# Patient Record
Sex: Male | Born: 1950 | Race: White | Hispanic: No | Marital: Married | Smoking: Former smoker
Health system: Southern US, Community
[De-identification: ages and names within clinical notes are randomized; demographics above are authoritative.]

## PROBLEM LIST (undated history)

## (undated) DIAGNOSIS — E119 Type 2 diabetes mellitus without complications: Secondary | ICD-10-CM

## (undated) DIAGNOSIS — I7090 Unspecified atherosclerosis: Secondary | ICD-10-CM

## (undated) DIAGNOSIS — I499 Cardiac arrhythmia, unspecified: Secondary | ICD-10-CM

## (undated) DIAGNOSIS — M359 Systemic involvement of connective tissue, unspecified: Secondary | ICD-10-CM

## (undated) DIAGNOSIS — G4730 Sleep apnea, unspecified: Secondary | ICD-10-CM

## (undated) DIAGNOSIS — I639 Cerebral infarction, unspecified: Secondary | ICD-10-CM

## (undated) DIAGNOSIS — G459 Transient cerebral ischemic attack, unspecified: Secondary | ICD-10-CM

## (undated) DIAGNOSIS — IMO0002 Reserved for concepts with insufficient information to code with codable children: Secondary | ICD-10-CM

## (undated) DIAGNOSIS — F329 Major depressive disorder, single episode, unspecified: Secondary | ICD-10-CM

## (undated) DIAGNOSIS — I4891 Unspecified atrial fibrillation: Secondary | ICD-10-CM

## (undated) DIAGNOSIS — R42 Dizziness and giddiness: Secondary | ICD-10-CM

## (undated) DIAGNOSIS — M1710 Unilateral primary osteoarthritis, unspecified knee: Secondary | ICD-10-CM

## (undated) DIAGNOSIS — Z972 Presence of dental prosthetic device (complete) (partial): Secondary | ICD-10-CM

## (undated) DIAGNOSIS — Z8669 Personal history of other diseases of the nervous system and sense organs: Secondary | ICD-10-CM

## (undated) DIAGNOSIS — Z8619 Personal history of other infectious and parasitic diseases: Secondary | ICD-10-CM

## (undated) DIAGNOSIS — F32A Depression, unspecified: Secondary | ICD-10-CM

## (undated) DIAGNOSIS — J42 Unspecified chronic bronchitis: Secondary | ICD-10-CM

## (undated) DIAGNOSIS — I340 Nonrheumatic mitral (valve) insufficiency: Secondary | ICD-10-CM

## (undated) DIAGNOSIS — I071 Rheumatic tricuspid insufficiency: Secondary | ICD-10-CM

## (undated) DIAGNOSIS — I10 Essential (primary) hypertension: Secondary | ICD-10-CM

## (undated) DIAGNOSIS — C801 Malignant (primary) neoplasm, unspecified: Secondary | ICD-10-CM

## (undated) DIAGNOSIS — C8590 Non-Hodgkin lymphoma, unspecified, unspecified site: Secondary | ICD-10-CM

## (undated) DIAGNOSIS — H4920 Sixth [abducent] nerve palsy, unspecified eye: Secondary | ICD-10-CM

## (undated) DIAGNOSIS — M797 Fibromyalgia: Secondary | ICD-10-CM

## (undated) DIAGNOSIS — K219 Gastro-esophageal reflux disease without esophagitis: Secondary | ICD-10-CM

## (undated) HISTORY — DX: Personal history of other infectious and parasitic diseases: Z86.19

## (undated) HISTORY — DX: Sixth (abducent) nerve palsy, unspecified eye: H49.20

## (undated) HISTORY — DX: Transient cerebral ischemic attack, unspecified: G45.9

## (undated) HISTORY — DX: Cerebral infarction, unspecified: I63.9

## (undated) HISTORY — DX: Personal history of other diseases of the nervous system and sense organs: Z86.69

## (undated) HISTORY — PX: TRIGGER FINGER RELEASE: SHX641

## (undated) HISTORY — PX: CYST EXCISION: SHX5701

## (undated) HISTORY — DX: Unilateral primary osteoarthritis, unspecified knee: M17.10

## (undated) HISTORY — DX: Essential (primary) hypertension: I10

## (undated) HISTORY — DX: Gastro-esophageal reflux disease without esophagitis: K21.9

## (undated) HISTORY — DX: Unspecified atrial fibrillation: I48.91

## (undated) HISTORY — DX: Reserved for concepts with insufficient information to code with codable children: IMO0002

## (undated) HISTORY — DX: Non-Hodgkin lymphoma, unspecified, unspecified site: C85.90

---

## 2009-08-15 ENCOUNTER — Ambulatory Visit: Payer: Self-pay | Admitting: Gastroenterology

## 2009-08-17 LAB — PATHOLOGY REPORT

## 2010-01-06 HISTORY — PX: COLONOSCOPY: SHX174

## 2011-12-19 ENCOUNTER — Emergency Department: Payer: Self-pay | Admitting: Emergency Medicine

## 2011-12-19 LAB — COMPREHENSIVE METABOLIC PANEL
Albumin: 4.4 g/dL (ref 3.4–5.0)
Alkaline Phosphatase: 110 U/L (ref 50–136)
Anion Gap: 7 (ref 7–16)
BUN: 18 mg/dL (ref 7–18)
Bilirubin,Total: 0.7 mg/dL (ref 0.2–1.0)
Calcium, Total: 9 mg/dL (ref 8.5–10.1)
Chloride: 106 mmol/L (ref 98–107)
Co2: 26 mmol/L (ref 21–32)
Creatinine: 1.06 mg/dL (ref 0.60–1.30)
EGFR (African American): >60
EGFR (Non-African Amer.): >60
Glucose: 95 mg/dL (ref 65–99)
Osmolality: 279 (ref 275–301)
Potassium: 4.1 mmol/L (ref 3.5–5.1)
SGOT(AST): 44 U/L — ABNORMAL HIGH (ref 15–37)
SGPT (ALT): 46 U/L (ref 12–78)
Sodium: 139 mmol/L (ref 136–145)
Total Protein: 9 g/dL — ABNORMAL HIGH (ref 6.4–8.2)

## 2011-12-19 LAB — CBC
HCT: 47.1 % (ref 40.0–52.0)
HGB: 15.4 g/dL (ref 13.0–18.0)
MCH: 28 pg (ref 26.0–34.0)
MCHC: 32.6 g/dL (ref 32.0–36.0)
MCV: 86 fL (ref 80–100)
Platelet: 348 10*3/uL (ref 150–440)
RBC: 5.48 10*6/uL (ref 4.40–5.90)
RDW: 14.2 % (ref 11.5–14.5)
WBC: 8.5 10*3/uL (ref 3.8–10.6)

## 2011-12-19 LAB — PROTIME-INR
INR: 1
Prothrombin Time: 13.1 secs (ref 11.5–14.7)

## 2011-12-19 LAB — CK TOTAL AND CKMB (NOT AT ARMC)
CK, Total: 143 U/L (ref 35–232)
CK-MB: 3.2 ng/mL (ref 0.5–3.6)

## 2011-12-19 LAB — TROPONIN I: Troponin-I: <0.02 ng/mL

## 2011-12-19 LAB — APTT: Activated PTT: 36.9 secs — ABNORMAL HIGH (ref 23.6–35.9)

## 2011-12-23 ENCOUNTER — Ambulatory Visit (INDEPENDENT_AMBULATORY_CARE_PROVIDER_SITE_OTHER): Payer: PRIVATE HEALTH INSURANCE | Admitting: Cardiovascular Disease

## 2011-12-23 ENCOUNTER — Encounter: Payer: Self-pay | Admitting: Cardiovascular Disease

## 2011-12-23 VITALS — BP 112/80 | HR 106 | Ht 66.0 in | Wt 266.8 lb

## 2011-12-23 DIAGNOSIS — I10 Essential (primary) hypertension: Secondary | ICD-10-CM

## 2011-12-23 DIAGNOSIS — E785 Hyperlipidemia, unspecified: Secondary | ICD-10-CM

## 2011-12-23 DIAGNOSIS — I4891 Unspecified atrial fibrillation: Secondary | ICD-10-CM

## 2011-12-23 MED ORDER — DILTIAZEM HCL ER COATED BEADS 240 MG PO CP24: 240.0000 mg | ORAL_CAPSULE | Freq: Every day | ORAL | Status: DC

## 2011-12-23 MED ORDER — LOSARTAN POTASSIUM 50 MG PO TABS: 50.0000 mg | ORAL_TABLET | Freq: Every day | ORAL | Status: DC

## 2011-12-23 NOTE — Assessment & Plan Note (Signed)
The patient has atrial fibrillation of unknown duration. He does not seem to be symptomatic from this even when he was more tachycardic. Thus, I don't see much utility of restoring sinus rhythm with an antiarrhythmic medication or cardioversion. He cut down on caffeine intake significantly. I recommend rate control. I will increase the dose of diltiazem extended release to 240 mg once daily. Continue aspirin daily. His [place] VASc score is 1 due to hypertension. Thus, his thromboembolic risk is low. He does not have symptoms suggestive of sleep apnea. I recommend an echocardiogram to evaluate LV systolic function and any other associated structural abnormalities. Followup after echocardiogram.

## 2011-12-23 NOTE — Progress Notes (Signed)
Primary care physician: Dr. Brayton El with with [facility]  HPI  This is a pleasant 61 year old gentleman who is here today for followup visit after an emergency room visit for atrial fibrillation. The patient has a long history of well-controlled hypertension and obesity. He presented recently for a routine physical with his primary care physician and was noted to be tachycardic with irregular rhythm. ECG confirms atrial fibrillation. He was tachycardic with a heart rate of 128 beats per minute. Surprisingly, he did not have much symptoms related to that. He was sent to the emergency room at [facility]. His labs were unremarkable. Cardiac enzymes were negative. Thyroid function was normal. He was discharged home on diltiazem extended release 120 mg daily as well as aspirin daily for stroke prophylaxis. The patient is not aware of any previous history of cardiac arrhythmia. He has no history of ischemic heart disease or heart failure. There is no history of diabetes or stroke. He was not aware that he was tachycardic. He denies chest pain or dyspnea. He has no palpitation. He has no history of sleep apnea. He was drinking coffee heavily on the average of one pot a day. Since his diagnosis with atrial fibrillation, he switched to decaf.  Allergies  Allergen Reactions  . Celebrex (Celecoxib)   . Penicillins     Hives      Current Outpatient Prescriptions on File Prior to Visit  Medication Sig Dispense Refill  . loratadine (CLARITIN) 10 MG tablet Take 10 mg by mouth daily.      Marland Kitchen omeprazole (PRILOSEC) 20 MG capsule Take 20 mg by mouth daily.      Marland Kitchen diltiazem (CARDIZEM CD) 240 MG 24 hr capsule Take 1 capsule (240 mg total) by mouth daily.  31 capsule  6  . losartan (COZAAR) 50 MG tablet Take 1 tablet (50 mg total) by mouth daily.  31 tablet  6     Past Medical History  Diagnosis Date  . GERD (gastroesophageal reflux disease)   . Osteoarthrosis, unspecified whether generalized or  localized, lower leg   . H/O Bell's palsy   . History of [place] measles   . Atrial fibrillation   . Hypertension      Past Surgical History  Procedure Date  . Cyst excision   . Trigger finger release      Family History  Problem Relation Age of Onset  . Hypertension Mother      History   Social History  . Marital Status: Married    Spouse Name: N/A    Number of Children: N/A  . Years of Education: N/A   Occupational History  . Not on file.   Social History Main Topics  . Smoking status: Former Smoker -- 4.5 packs/day for 15 years    Types: Cigarettes  . Smokeless tobacco: Not on file  . Alcohol Use: No  . Drug Use: Yes    Special: Marijuana     Comment: PAST  . Sexually Active:    Other Topics Concern  . Not on file   Social History Narrative  . No narrative on file     ROS Constitutional: Negative for fever, chills, diaphoresis, activity change, appetite change and fatigue.  HENT: Negative for hearing loss, nosebleeds, congestion, sore throat, facial swelling, drooling, trouble swallowing, neck pain, voice change, sinus pressure and tinnitus.  Eyes: Negative for photophobia, pain, discharge and visual disturbance.  Respiratory: Negative for apnea, cough, chest tightness, shortness of breath and wheezing.  Cardiovascular: Negative for chest pain, palpitations and leg swelling.  Gastrointestinal: Negative for nausea, vomiting, abdominal pain, diarrhea, constipation, blood in stool and abdominal distention.  Genitourinary: Negative for dysuria, urgency, frequency, hematuria and decreased urine volume.  Musculoskeletal: Negative for myalgias, back pain, joint swelling, arthralgias and gait problem.  Skin: Negative for color change, pallor, rash and wound.  Neurological: Negative for dizziness, tremors, seizures, syncope, speech difficulty, weakness, light-headedness, numbness and headaches.  Psychiatric/Behavioral: Negative for suicidal ideas, hallucinations,  behavioral problems and agitation. The patient is not nervous/anxious.     PHYSICAL EXAM   BP 112/80  Pulse 106  Ht 5\' 6"  (1.676 m)  Wt 266 lb 12 oz (120.997 kg)  BMI 43.05 kg/m2 Constitutional: He is oriented to person, place, and time. He appears well-developed and well-nourished. No distress.  HENT: No nasal discharge.  Head: Normocephalic and atraumatic.  Eyes: Pupils are equal and round. Right eye exhibits no discharge. Left eye exhibits no discharge.  Neck: Normal range of motion. Neck supple. No JVD present. No thyromegaly present.  Cardiovascular: Slightly tachycardic, irregular rhythm, normal heart sounds and. Exam reveals no gallop and no friction rub. No murmur heard.  Pulmonary/Chest: Effort normal and breath sounds normal. No stridor. No respiratory distress. He has no wheezes. He has no rales. He exhibits no tenderness.  Abdominal: Soft. Bowel sounds are normal. He exhibits no distension. There is no tenderness. There is no rebound and no guarding.  Musculoskeletal: Normal range of motion. He exhibits no edema and no tenderness.  Neurological: He is alert and oriented to person, place, and time. Coordination normal.  Skin: Skin is warm and dry. No rash noted. He is not diaphoretic. No erythema. No pallor.  Psychiatric: He has a normal mood and affect. His behavior is normal. Judgment and thought content normal.       EKG: Atrial fibrillation  ABNORMAL RHYTHM   ASSESSMENT AND PLAN

## 2011-12-23 NOTE — Assessment & Plan Note (Signed)
Given that I am increasing the dose of diltiazem, I would decrease the dose of losartan 50 mg once daily to prevent hypotension.

## 2011-12-23 NOTE — Assessment & Plan Note (Signed)
Recent lipid profile showed a total cholesterol of 208, HDL 51, triglyceride of 78 and an LDL of 141. The patient has no history of documented atherosclerosis. Thus, I recommend an attempt of lifestyle changes. The patient is determined actually to lose weight.

## 2011-12-23 NOTE — Patient Instructions (Addendum)
Increase Diltiazem ER (Cardizem) to 240 mg once daily.  Decrease Losartan to 50 mg once daily.   Your physician has requested that you have an echocardiogram. Echocardiography is a painless test that uses sound waves to create images of your heart. It provides your doctor with information about the size and shape of your heart and how well your heart's chambers and valves are working. This procedure takes approximately one hour. There are no restrictions for this procedure.  Follow up after echo.

## 2011-12-26 ENCOUNTER — Ambulatory Visit: Payer: Self-pay | Admitting: Family Medicine

## 2011-12-29 ENCOUNTER — Encounter: Payer: Self-pay | Admitting: Cardiovascular Disease

## 2012-01-01 ENCOUNTER — Other Ambulatory Visit (INDEPENDENT_AMBULATORY_CARE_PROVIDER_SITE_OTHER): Payer: PRIVATE HEALTH INSURANCE

## 2012-01-01 ENCOUNTER — Other Ambulatory Visit: Payer: Self-pay

## 2012-01-01 DIAGNOSIS — I4891 Unspecified atrial fibrillation: Secondary | ICD-10-CM

## 2012-01-06 ENCOUNTER — Ambulatory Visit (INDEPENDENT_AMBULATORY_CARE_PROVIDER_SITE_OTHER): Payer: PRIVATE HEALTH INSURANCE | Admitting: Cardiovascular Disease

## 2012-01-06 ENCOUNTER — Encounter: Payer: Self-pay | Admitting: Cardiovascular Disease

## 2012-01-06 VITALS — BP 132/78 | HR 80 | Ht 66.0 in | Wt 271.0 lb

## 2012-01-06 DIAGNOSIS — I4891 Unspecified atrial fibrillation: Secondary | ICD-10-CM

## 2012-01-06 DIAGNOSIS — E785 Hyperlipidemia, unspecified: Secondary | ICD-10-CM

## 2012-01-06 DIAGNOSIS — I10 Essential (primary) hypertension: Secondary | ICD-10-CM

## 2012-01-06 NOTE — Progress Notes (Signed)
Primary care physician: Dr. Brayton El with with [facility]  HPI  This is a pleasant 61 year old gentleman who is here today for followup visit regarding recently diagnosed atrial fibrillation. The patient has a long history of well-controlled hypertension and obesity. He presented recently for a routine physical with his primary care physician and was noted to be tachycardic with irregular rhythm. ECG confirms atrial fibrillation. He was tachycardic with a heart rate of 128 beats per minute. Surprisingly, he did not have much symptoms related to that. He was sent to the emergency room at [facility]. His labs were unremarkable. Cardiac enzymes were negative. Thyroid function was normal. He was discharged home on diltiazem extended release 120 mg daily as well as aspirin daily for stroke prophylaxis. There is no history of diabetes or stroke. He was not aware that he was tachycardic. He has no history of sleep apnea. He was drinking coffee heavily on the average of one pot a day. Since his diagnosis with atrial fibrillation, he switched to decaf. During last visit, he was still in atrial fibrillation with mild tachycardia. Thus, I increased diltiazem 240 mg once daily and decrease losartan 50 mg once daily. He continues to deny chest pain, dyspnea or palpitations  Allergies  Allergen Reactions  . Celebrex (Celecoxib)   . Penicillins     Hives      Current Outpatient Prescriptions on File Prior to Visit  Medication Sig Dispense Refill  . aspirin 325 MG tablet Take 325 mg by mouth daily.      Marland Kitchen b complex vitamins tablet Take 1 tablet by mouth daily.      . B Complex-C (SUPER B COMPLEX PO) Take by mouth daily.      . diclofenac (VOLTAREN) 75 MG EC tablet Take 75 mg by mouth daily.       Marland Kitchen diltiazem (CARDIZEM CD) 240 MG 24 hr capsule Take 1 capsule (240 mg total) by mouth daily.  31 capsule  6  . loratadine (CLARITIN) 10 MG tablet Take 10 mg by mouth daily.      Marland Kitchen losartan (COZAAR) 50  MG tablet Take 1 tablet (50 mg total) by mouth daily.  31 tablet  6  . Magnesium 100 MG CAPS Takes 2 tablets daily.      . Melatonin 5 MG TABS Take by mouth daily.      Marland Kitchen omeprazole (PRILOSEC) 20 MG capsule Take 20 mg by mouth daily.         Past Medical History  Diagnosis Date  . GERD (gastroesophageal reflux disease)   . Osteoarthrosis, unspecified whether generalized or localized, lower leg   . H/O Bell's palsy   . History of [place] measles   . Atrial fibrillation   . Hypertension      Past Surgical History  Procedure Date  . Cyst excision   . Trigger finger release      Family History  Problem Relation Age of Onset  . Hypertension Mother      History   Social History  . Marital Status: Married    Spouse Name: N/A    Number of Children: N/A  . Years of Education: N/A   Occupational History  . Not on file.   Social History Main Topics  . Smoking status: Former Smoker -- 4.5 packs/day for 15 years    Types: Cigarettes  . Smokeless tobacco: Not on file  . Alcohol Use: No  . Drug Use: Yes    Special: Marijuana  Comment: PAST  . Sexually Active:    Other Topics Concern  . Not on file   Social History Narrative  . No narrative on file      PHYSICAL EXAM   BP 132/78  Pulse 80  Ht 5\' 6"  (1.676 m)  Wt 271 lb (122.925 kg)  BMI 43.74 kg/m2 Constitutional: He is oriented to person, place, and time. He appears well-developed and well-nourished. No distress.  HENT: No nasal discharge.  Head: Normocephalic and atraumatic.  Eyes: Pupils are equal and round. Right eye exhibits no discharge. Left eye exhibits no discharge.  Neck: Normal range of motion. Neck supple. No JVD present. No thyromegaly present.  Cardiovascular: Normal rate,regular rhythm, normal heart sounds and. Exam reveals no gallop and no friction rub. No murmur heard.  Pulmonary/Chest: Effort normal and breath sounds normal. No stridor. No respiratory distress. He has no wheezes. He has  no rales. He exhibits no tenderness.  Abdominal: Soft. Bowel sounds are normal. He exhibits no distension. There is no tenderness. There is no rebound and no guarding.  Musculoskeletal: Normal range of motion. He exhibits no edema and no tenderness.  Neurological: He is alert and oriented to person, place, and time. Coordination normal.  Skin: Skin is warm and dry. No rash noted. He is not diaphoretic. No erythema. No pallor.  Psychiatric: He has a normal mood and affect. His behavior is normal. Judgment and thought content normal.       EKG: Normal sinus rhythm.  ASSESSMENT AND PLAN

## 2012-01-06 NOTE — Assessment & Plan Note (Signed)
Recent lipid profile showed a total cholesterol of 208, HDL 51, triglyceride of 78 and an LDL of 141. The patient has no history of documented atherosclerosis. Thus, I recommend an attempt of lifestyle changes including diet, exercise and weight loss.

## 2012-01-06 NOTE — Patient Instructions (Addendum)
Continue same medications.  Follow up in 6 months.  

## 2012-01-06 NOTE — Assessment & Plan Note (Signed)
Blood pressure is well controlled 

## 2012-01-06 NOTE — Assessment & Plan Note (Addendum)
The patient is back into normal sinus rhythm. Continue diltiazem extended release at current dose. Continue aspirin daily. I advised him against excessive consumption of caffeinated products. I discussed with him the importance of exercise and weight loss. Echocardiogram showed normal LV systolic function with mild left ventricular hypertrophy, mild to moderate mitral regurgitation and mildly dilated left atrium. He is to followup in 6 months.

## 2012-01-08 ENCOUNTER — Other Ambulatory Visit: Payer: Self-pay | Admitting: Cardiovascular Disease

## 2012-07-05 ENCOUNTER — Ambulatory Visit (INDEPENDENT_AMBULATORY_CARE_PROVIDER_SITE_OTHER): Payer: 59 | Admitting: Cardiovascular Disease

## 2012-07-05 ENCOUNTER — Encounter: Payer: Self-pay | Admitting: Cardiovascular Disease

## 2012-07-05 VITALS — BP 146/76 | HR 79 | Ht 66.0 in | Wt 271.2 lb

## 2012-07-05 DIAGNOSIS — R0681 Apnea, not elsewhere classified: Secondary | ICD-10-CM

## 2012-07-05 DIAGNOSIS — F3289 Other specified depressive episodes: Secondary | ICD-10-CM

## 2012-07-05 DIAGNOSIS — F329 Major depressive disorder, single episode, unspecified: Secondary | ICD-10-CM | POA: Insufficient documentation

## 2012-07-05 DIAGNOSIS — F32A Depression, unspecified: Secondary | ICD-10-CM | POA: Insufficient documentation

## 2012-07-05 DIAGNOSIS — I4891 Unspecified atrial fibrillation: Secondary | ICD-10-CM

## 2012-07-05 DIAGNOSIS — I10 Essential (primary) hypertension: Secondary | ICD-10-CM

## 2012-07-05 MED ORDER — LOSARTAN POTASSIUM 50 MG PO TABS: 50.0000 mg | ORAL_TABLET | Freq: Every day | ORAL | Status: DC

## 2012-07-05 MED ORDER — DILTIAZEM HCL ER COATED BEADS 240 MG PO CP24: 240.0000 mg | ORAL_CAPSULE | Freq: Every day | ORAL | Status: DC

## 2012-07-05 NOTE — Assessment & Plan Note (Signed)
He has symptoms suggestive of sleep apnea which can contribute to increased burden of atrial fibrillation. I recommended evaluation with a sleep study and offered to order the test. He wants to discuss this with his primary care physician,  Dr. Sherryll Burger.

## 2012-07-05 NOTE — Assessment & Plan Note (Signed)
His blood pressure is well controlled on current medications. 

## 2012-07-05 NOTE — Patient Instructions (Addendum)
Continue same medications  Follow up in 1 year

## 2012-07-05 NOTE — Progress Notes (Signed)
Primary care physician: Dr. Brayton El with with [facility]  HPI  This is a pleasant 62 year old gentleman who is here today for followup visit regarding paroxysmal atrial fibrillation. The patient has a long history of well-controlled hypertension and obesity. He was diagnosed with atrial fibrillation with rapid ventricular response during a routine physical with his primary care physician in December of 2013. He was sent to the emergency room at [facility]. His labs were unremarkable. Cardiac enzymes were negative. Thyroid function was normal. He was discharged home on diltiazem extended release 120 mg daily as well as aspirin daily for stroke prophylaxis. There is no history of diabetes or stroke.  He was drinking coffee heavily on the average of one pot a day. Since his diagnosis with atrial fibrillation, he switched to decaf. The dose of diltiazem was subsequently increased to 240 mg once daily. He was noted to be back in normal sinus rhythm on followup. He has been doing well and denies any chest pain, dyspnea or palpitations. He is under significant stress and seems to be suffering from depression related to recent bankruptcy. His wife has also told him about loud noise and possible apnea episodes during sleep.    Allergies  Allergen Reactions  . Celebrex (Celecoxib)   . Penicillins     Hives      Current Outpatient Prescriptions on File Prior to Visit  Medication Sig Dispense Refill  . aspirin 325 MG tablet Take 325 mg by mouth daily.      . B Complex-C (SUPER B COMPLEX PO) Take by mouth daily.      . diclofenac (VOLTAREN) 75 MG EC tablet Take 75 mg by mouth daily.       Marland Kitchen loratadine (CLARITIN) 10 MG tablet Take 10 mg by mouth daily.      . Magnesium 100 MG CAPS Takes 2 tablets daily.      . Melatonin 5 MG TABS Take by mouth daily.      Marland Kitchen omeprazole (PRILOSEC) 20 MG capsule Take 20 mg by mouth daily.       No current facility-administered medications on file prior to  visit.     Past Medical History  Diagnosis Date  . GERD (gastroesophageal reflux disease)   . Osteoarthrosis, unspecified whether generalized or localized, lower leg   . H/O Bell's palsy   . History of [place] measles   . Atrial fibrillation   . Hypertension      Past Surgical History  Procedure Laterality Date  . Cyst excision    . Trigger finger release       Family History  Problem Relation Age of Onset  . Hypertension Mother      History   Social History  . Marital Status: Married    Spouse Name: N/A    Number of Children: N/A  . Years of Education: N/A   Occupational History  . Not on file.   Social History Main Topics  . Smoking status: Former Smoker -- 4.50 packs/day for 15 years    Types: Cigarettes  . Smokeless tobacco: Not on file  . Alcohol Use: No  . Drug Use: Yes    Special: Marijuana     Comment: PAST  . Sexually Active:    Other Topics Concern  . Not on file   Social History Narrative  . No narrative on file      PHYSICAL EXAM   BP 146/76  Pulse 79  Ht 5\' 6"  (1.676 m)  Wt 271 lb 4 oz (123.038 kg)  BMI 43.8 kg/m2 Constitutional: He is oriented to person, place, and time. He appears well-developed and well-nourished. No distress.  HENT: No nasal discharge.  Head: Normocephalic and atraumatic.  Eyes: Pupils are equal and round. Right eye exhibits no discharge. Left eye exhibits no discharge.  Neck: Normal range of motion. Neck supple. No JVD present. No thyromegaly present.  Cardiovascular: Normal rate,regular rhythm, normal heart sounds and. Exam reveals no gallop and no friction rub. No murmur heard.  Pulmonary/Chest: Effort normal and breath sounds normal. No stridor. No respiratory distress. He has no wheezes. He has no rales. He exhibits no tenderness.  Abdominal: Soft. Bowel sounds are normal. He exhibits no distension. There is no tenderness. There is no rebound and no guarding.  Musculoskeletal: Normal range of motion. He  exhibits no edema and no tenderness.  Neurological: He is alert and oriented to person, place, and time. Coordination normal.  Skin: Skin is warm and dry. No rash noted. He is not diaphoretic. No erythema. No pallor.  Psychiatric: He has a normal mood and affect. His behavior is normal. Judgment and thought content normal.       EKG: Normal sinus rhythm.  ASSESSMENT AND PLAN

## 2012-07-05 NOTE — Assessment & Plan Note (Signed)
The patient continues to be in normal sinus rhythm. I recommend continuing current dose of diltiazem and aspirin.

## 2012-07-05 NOTE — Assessment & Plan Note (Signed)
The patient seems to be dealing with depression related to recent bankruptcy. He reports prolonged symptoms of depression and used to be on Paxil in the past. There is no suicidal or homicidal ideation. I strongly advised him to discuss this with his primary care physician and seek treatment.

## 2012-07-14 ENCOUNTER — Encounter: Payer: Self-pay | Admitting: *Deleted

## 2012-12-29 ENCOUNTER — Ambulatory Visit (INDEPENDENT_AMBULATORY_CARE_PROVIDER_SITE_OTHER): Payer: 59 | Admitting: Cardiovascular Disease

## 2012-12-29 ENCOUNTER — Encounter: Payer: Self-pay | Admitting: Cardiovascular Disease

## 2012-12-29 VITALS — BP 183/77 | HR 107 | Ht 66.0 in | Wt 266.5 lb

## 2012-12-29 DIAGNOSIS — I10 Essential (primary) hypertension: Secondary | ICD-10-CM

## 2012-12-29 DIAGNOSIS — R9431 Abnormal electrocardiogram [ECG] [EKG]: Secondary | ICD-10-CM

## 2012-12-29 DIAGNOSIS — R0609 Other forms of dyspnea: Secondary | ICD-10-CM

## 2012-12-29 DIAGNOSIS — R0681 Apnea, not elsewhere classified: Secondary | ICD-10-CM

## 2012-12-29 DIAGNOSIS — R0989 Other specified symptoms and signs involving the circulatory and respiratory systems: Secondary | ICD-10-CM

## 2012-12-29 DIAGNOSIS — I4891 Unspecified atrial fibrillation: Secondary | ICD-10-CM

## 2012-12-29 DIAGNOSIS — R0683 Snoring: Secondary | ICD-10-CM

## 2012-12-29 MED ORDER — DILTIAZEM HCL ER 360 MG PO CP24: 360.0000 mg | ORAL_CAPSULE | Freq: Every day | ORAL | Status: DC

## 2012-12-29 NOTE — Patient Instructions (Addendum)
Avoid second hand smoking. No exposure to nicotine is allowed.    Your physician has recommended that you have a sleep study. This test records several body functions during sleep, including: brain activity, eye movement, oxygen and carbon dioxide blood levels, heart rate and rhythm, breathing rate and rhythm, the flow of air through your mouth and nose, snoring, body muscle movements, and chest and belly movement. [facility] should be contacting you within 3-5 business day. If you do not here from them please contact our office.    Your physician has recommended you make the following change in your medication:  Increase Cardizem to 360 mg daily    Your physician recommends that you schedule a follow-up appointment in: 1 month

## 2012-12-29 NOTE — Progress Notes (Signed)
Primary care physician: Dr. Brayton El with with [facility]  HPI  This is a pleasant 62 year old gentleman who is here today for followup visit regarding paroxysmal atrial fibrillation. He was added to my schedule as he was noted by Dr. Sherryll Burger to be in atrial fibrillation. The patient has a long history of well-controlled hypertension and obesity. He was diagnosed with atrial fibrillation with rapid ventricular response during a routine physical  in December of 2013. He was sent to the emergency room at [facility]. His labs were unremarkable. Cardiac enzymes were negative. Thyroid function was normal. He was discharged home on diltiazem extended release 120 mg daily as well as aspirin daily for stroke prophylaxis. There is no history of diabetes or stroke.  He was drinking coffee heavily on the average of one pot a day. Since his diagnosis with atrial fibrillation, he switched to decaf. The dose of diltiazem was subsequently increased to 240 mg once daily. He was noted to be back in normal sinus rhythm on followup.  He was noted to be in atrial fibrillation with a heart rate of 107 today. He completely denies any chest pain, dyspnea or palpitations.  Allergies  Allergen Reactions  . Celebrex [Celecoxib]   . Penicillins     Hives      Current Outpatient Prescriptions on File Prior to Visit  Medication Sig Dispense Refill  . aspirin 325 MG tablet Take 325 mg by mouth daily.      . B Complex-C (SUPER B COMPLEX PO) Take by mouth daily.      . diclofenac (VOLTAREN) 75 MG EC tablet Take 75 mg by mouth daily.       Marland Kitchen loratadine (CLARITIN) 10 MG tablet Take 10 mg by mouth daily.      Marland Kitchen losartan (COZAAR) 50 MG tablet Take 1 tablet (50 mg total) by mouth daily.  30 tablet  11  . Melatonin 5 MG TABS Take by mouth as needed.       Marland Kitchen omeprazole (PRILOSEC) 20 MG capsule Take 20 mg by mouth daily.       No current facility-administered medications on file prior to visit.     Past Medical  History  Diagnosis Date  . GERD (gastroesophageal reflux disease)   . Osteoarthrosis, unspecified whether generalized or localized, lower leg   . H/O Bell's palsy   . History of [place] measles   . Atrial fibrillation   . Hypertension      Past Surgical History  Procedure Laterality Date  . Cyst excision    . Trigger finger release       Family History  Problem Relation Age of Onset  . Hypertension Mother      History   Social History  . Marital Status: Married    Spouse Name: N/A    Number of Children: N/A  . Years of Education: N/A   Occupational History  . Not on file.   Social History Main Topics  . Smoking status: Former Smoker -- 4.50 packs/day for 15 years    Types: Cigarettes  . Smokeless tobacco: Not on file  . Alcohol Use: No  . Drug Use: Yes    Special: Marijuana     Comment: PAST  . Sexual Activity:    Other Topics Concern  . Not on file   Social History Narrative  . No narrative on file      PHYSICAL EXAM   BP 183/77  Pulse 107  Ht 5'  6" (1.676 m)  Wt 266 lb 8 oz (120.884 kg)  BMI 43.03 kg/m2 Constitutional: He is oriented to person, place, and time. He appears well-developed and well-nourished. No distress.  HENT: No nasal discharge.  Head: Normocephalic and atraumatic.  Eyes: Pupils are equal and round. Right eye exhibits no discharge. Left eye exhibits no discharge.  Neck: Normal range of motion. Neck supple. No JVD present. No thyromegaly present.  Cardiovascular: Mildly tachycardic,irregular rhythm, normal heart sounds and. Exam reveals no gallop and no friction rub. No murmur heard.  Pulmonary/Chest: Effort normal and breath sounds normal. No stridor. No respiratory distress. He has no wheezes. He has no rales. He exhibits no tenderness.  Abdominal: Soft. Bowel sounds are normal. He exhibits no distension. There is no tenderness. There is no rebound and no guarding.  Musculoskeletal: Normal range of motion. He exhibits no edema  and no tenderness.  Neurological: He is alert and oriented to person, place, and time. Coordination normal.  Skin: Skin is warm and dry. No rash noted. He is not diaphoretic. No erythema. No pallor.  Psychiatric: He has a normal mood and affect. His behavior is normal. Judgment and thought content normal.       EKG: Atrial fibrillation with a ventricular rate of 107 beats per minute  ASSESSMENT AND PLAN

## 2012-12-29 NOTE — Assessment & Plan Note (Signed)
A home sleep study was requested.

## 2012-12-29 NOTE — Assessment & Plan Note (Signed)
Blood pressure is elevated today. The dose of diltiazem was increased as outlined above.

## 2012-12-29 NOTE — Assessment & Plan Note (Signed)
Although the patient is in atrial fibrillation, he is completely asymptomatic. I recommend increasing the dose of diltiazem extended release to 360 mg once daily. I do think that we have to exclude sleep apnea as a trigger for his atrial fibrillation. Thus, I requested a home sleep study evaluation. His [place] VASc score is 1. Continue treatment with aspirin.

## 2013-01-27 ENCOUNTER — Other Ambulatory Visit: Payer: Self-pay

## 2013-01-27 DIAGNOSIS — R0681 Apnea, not elsewhere classified: Secondary | ICD-10-CM

## 2013-01-27 DIAGNOSIS — R0683 Snoring: Secondary | ICD-10-CM

## 2013-01-27 DIAGNOSIS — I4891 Unspecified atrial fibrillation: Secondary | ICD-10-CM

## 2013-02-03 ENCOUNTER — Encounter: Payer: Self-pay | Admitting: Cardiovascular Disease

## 2013-02-03 ENCOUNTER — Ambulatory Visit (INDEPENDENT_AMBULATORY_CARE_PROVIDER_SITE_OTHER): Payer: 59 | Admitting: Cardiovascular Disease

## 2013-02-03 VITALS — BP 152/82 | HR 61 | Ht 67.0 in | Wt 270.8 lb

## 2013-02-03 DIAGNOSIS — G4733 Obstructive sleep apnea (adult) (pediatric): Secondary | ICD-10-CM | POA: Insufficient documentation

## 2013-02-03 DIAGNOSIS — G4730 Sleep apnea, unspecified: Secondary | ICD-10-CM

## 2013-02-03 DIAGNOSIS — I4891 Unspecified atrial fibrillation: Secondary | ICD-10-CM

## 2013-02-03 DIAGNOSIS — I10 Essential (primary) hypertension: Secondary | ICD-10-CM

## 2013-02-03 MED ORDER — LOSARTAN POTASSIUM 100 MG PO TABS: 100.0000 mg | ORAL_TABLET | Freq: Every day | ORAL | Status: DC

## 2013-02-03 NOTE — Patient Instructions (Signed)
Your physician wants you to follow-up in: 6 months. You will receive a reminder letter in the mail two months in advance. If you don't receive a letter, please call our office to schedule the follow-up appointment.   Your physician has recommended you make the following change in your medication:  Increase Losartan to 100 mg daily   You have been referred to Dr. Devona Konig for Sleep Apnea and CPAP

## 2013-02-03 NOTE — Assessment & Plan Note (Signed)
Home sleep study showed evidence of moderate sleep apnea which is likely contributing to his hypertension and atrial fibrillation. I am referring him to see Dr.Saadat Humphrey Rolls for management.

## 2013-02-03 NOTE — Assessment & Plan Note (Signed)
He is currently in normal sinus rhythm and has been doing well. Continue treatment with diltiazem extended release 360 mg once daily. His CHADS VASc score is 1. Continue treatment with aspirin.

## 2013-02-03 NOTE — Assessment & Plan Note (Signed)
Blood pressure is elevated. I increased the dose of losartan to 100 mg once daily.

## 2013-02-03 NOTE — Progress Notes (Signed)
Primary care physician: Dr. Keith Rake with with [facility]  HPI  This is a pleasant 63 year old gentleman who is here today for followup visit regarding paroxysmal atrial fibrillation.  The patient has a long history of well-controlled hypertension and obesity. He was diagnosed with atrial fibrillation with rapid ventricular response during a routine physical  in December of 2013. He was sent to the emergency room at [facility]. His labs were unremarkable. Cardiac enzymes were negative. Thyroid function was normal.There is no history of diabetes or stroke.  He was drinking coffee heavily on the average of one pot a day. Since his diagnosis with atrial fibrillation, he switched to decaf. He was seen last month for recurrent atrial fibrillation with a heart rate of 107 beats per minute. He was completely asymptomatic. I increased the dose of diltiazem extended release to 360 mg once daily. He gained significant amount of weight around the holiday. Since then, he went back on a low-calorie diet and has lost about 10 pounds. Home Sleep study showed moderate sleep apnea. 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 He started exercising recently and has been walking about 2 miles a day with no reported chest pain or dyspnea.  Allergies  Allergen Reactions  . Celebrex [Celecoxib]   . Penicillins     Hives      Current Outpatient Prescriptions on File Prior to Visit  Medication Sig Dispense Refill  . aspirin 325 MG tablet Take 325 mg by mouth daily.      . B Complex-C (SUPER B COMPLEX PO) Take by mouth daily.      . diclofenac (VOLTAREN) 75 MG EC tablet Take 75 mg by mouth daily.       . Diltiazem HCl ER 360 MG CP24 Take 360 mg by mouth daily.  30 capsule  6  . loratadine (CLARITIN) 10 MG tablet Take 10 mg by mouth daily.      Marland Kitchen losartan (COZAAR) 50 MG tablet Take 1 tablet (50 mg total) by mouth daily.  30 tablet  11  . magnesium oxide  (MAG-OX) 400 MG tablet Take 400 mg by mouth daily.      . Melatonin 5 MG TABS Take by mouth as needed.       Marland Kitchen omeprazole (PRILOSEC) 20 MG capsule Take 20 mg by mouth daily.       No current facility-administered medications on file prior to visit.     Past Medical History  Diagnosis Date  . GERD (gastroesophageal reflux disease)   . Osteoarthrosis, unspecified whether generalized or localized, lower leg   . H/O Bell's palsy   . History of [place] measles   . Atrial fibrillation   . Hypertension      Past Surgical History  Procedure Laterality Date  . Cyst excision    . Trigger finger release       Family History  Problem Relation Age of Onset  . Hypertension Mother      History   Social History  . Marital Status: Married    Spouse Name: N/A    Number of Children: N/A  . Years of Education: N/A   Occupational History  . Not on file.   Social History Main Topics  . Smoking status: Former Smoker -- 4.50 packs/day for 15 years    Types: Cigarettes  . Smokeless tobacco: Not on file  . Alcohol Use: No  . Drug Use: Yes  Special: Marijuana     Comment: PAST  . Sexual Activity:    Other Topics Concern  . Not on file   Social History Narrative  . No narrative on file      PHYSICAL EXAM   BP 152/82  Pulse 61  Ht 5\' 7"  (1.702 m)  Wt 270 lb 12 oz (122.811 kg)  BMI 42.40 kg/m2 Constitutional: He is oriented to person, place, and time. He appears well-developed and well-nourished. No distress.  HENT: No nasal discharge.  Head: Normocephalic and atraumatic.  Eyes: Pupils are equal and round. Right eye exhibits no discharge. Left eye exhibits no discharge.  Neck: Normal range of motion. Neck supple. No JVD present. No thyromegaly present.  Cardiovascular: Normal rate,regular rhythm, normal heart sounds and. Exam reveals no gallop and no friction rub. No murmur heard.  Pulmonary/Chest: Effort normal and breath sounds normal. No stridor. No respiratory  distress. He has no wheezes. He has no rales. He exhibits no tenderness.  Abdominal: Soft. Bowel sounds are normal. He exhibits no distension. There is no tenderness. There is no rebound and no guarding.  Musculoskeletal: Normal range of motion. He exhibits no edema and no tenderness.  Neurological: He is alert and oriented to person, place, and time. Coordination normal.  Skin: Skin is warm and dry. No rash noted. He is not diaphoretic. No erythema. No pallor.  Psychiatric: He has a normal mood and affect. His behavior is normal. Judgment and thought content normal.       EKG: Normal sinus rhythm with sinus  ASSESSMENT AND PLAN

## 2013-06-15 ENCOUNTER — Telehealth: Payer: Self-pay

## 2013-06-15 NOTE — Telephone Encounter (Signed)
Healthclaims management called and would like to know sleep study results. Please call.

## 2013-06-15 NOTE — Telephone Encounter (Signed)
Patients insurance co wanted to know if patient received CPAP  I informed her that our office would not be managing that device and that she can contact Dr. Trish Mage office

## 2013-07-27 ENCOUNTER — Other Ambulatory Visit: Payer: Self-pay | Admitting: Cardiovascular Disease

## 2013-08-04 ENCOUNTER — Ambulatory Visit: Payer: 59 | Admitting: Cardiovascular Disease

## 2013-08-11 ENCOUNTER — Ambulatory Visit: Payer: Self-pay | Admitting: Family Medicine

## 2013-08-16 ENCOUNTER — Ambulatory Visit: Payer: Self-pay | Admitting: Family Medicine

## 2013-08-18 ENCOUNTER — Ambulatory Visit: Payer: Self-pay | Admitting: Family Medicine

## 2013-08-22 NOTE — Telephone Encounter (Signed)
This encounter was created in error - please disregard.

## 2013-09-08 ENCOUNTER — Ambulatory Visit (INDEPENDENT_AMBULATORY_CARE_PROVIDER_SITE_OTHER): Payer: 59 | Admitting: Cardiovascular Disease

## 2013-09-08 ENCOUNTER — Encounter: Payer: Self-pay | Admitting: Cardiovascular Disease

## 2013-09-08 VITALS — BP 156/87 | HR 66 | Ht 67.0 in | Wt 258.2 lb

## 2013-09-08 DIAGNOSIS — I6350 Cerebral infarction due to unspecified occlusion or stenosis of unspecified cerebral artery: Secondary | ICD-10-CM

## 2013-09-08 DIAGNOSIS — E785 Hyperlipidemia, unspecified: Secondary | ICD-10-CM

## 2013-09-08 DIAGNOSIS — G459 Transient cerebral ischemic attack, unspecified: Secondary | ICD-10-CM

## 2013-09-08 DIAGNOSIS — I639 Cerebral infarction, unspecified: Secondary | ICD-10-CM | POA: Insufficient documentation

## 2013-09-08 DIAGNOSIS — I4891 Unspecified atrial fibrillation: Secondary | ICD-10-CM

## 2013-09-08 DIAGNOSIS — I480 Paroxysmal atrial fibrillation: Secondary | ICD-10-CM

## 2013-09-08 DIAGNOSIS — I10 Essential (primary) hypertension: Secondary | ICD-10-CM

## 2013-09-08 MED ORDER — RIVAROXABAN 20 MG PO TABS: 20.0000 mg | ORAL_TABLET | Freq: Every day | ORAL | Status: DC

## 2013-09-08 MED ORDER — OMEPRAZOLE 20 MG PO CPDR: 20.0000 mg | DELAYED_RELEASE_CAPSULE | Freq: Every day | ORAL | Status: DC

## 2013-09-08 NOTE — Assessment & Plan Note (Signed)
Previous lipid profile showed a total cholesterol of 208, HDL 51, triglyceride of 78 and an LDL of 141. We should strongly consider a statin given his recent stroke.

## 2013-09-08 NOTE — Assessment & Plan Note (Signed)
The patient is currently maintaining in sinus rhythm. It appears that he had a recent stroke in August which might be suggestive of an embolic etiology. I discussed the case with neurologist Dr. Manuella Ghazi over the phone who saw the patient 2 days ago. The patient was placed on Plavix. However, I feel that there is a strong indication for anticoagulation given paroxysmal atrial fibrillation. This was discussed extensively with the patient. I elected to start the patient on Xarelto and stop Plavix.

## 2013-09-08 NOTE — Assessment & Plan Note (Signed)
Blood pressure is elevated today but he seems to be anxious.

## 2013-09-08 NOTE — Patient Instructions (Signed)
Your physician has recommended you make the following change in your medication:  Stop Plavix  Stop Zantac Start Xarelto 20 mg once daily  Start Omeprazole 20 mg once daily    Your physician has requested that you have a carotid duplex. This test is an ultrasound of the carotid arteries in your neck. It looks at blood flow through these arteries that supply the brain with blood. Allow one hour for this exam. There are no restrictions or special instructions.   Your physician recommends that you schedule a follow-up appointment in:  3 months with Dr. Fletcher Anon

## 2013-09-08 NOTE — Progress Notes (Signed)
Primary care physician: Dr. Keith Rake with [facility]  HPI  This is a pleasant 63 year old gentleman who is here today for followup visit regarding paroxysmal atrial fibrillation.  The patient has a long history of well-controlled hypertension and obesity. He was diagnosed with atrial fibrillation with rapid ventricular response during a routine physical  in December of 2013. He was sent to the emergency room at [facility]. His labs were unremarkable. Cardiac enzymes were negative. Thyroid function was normal.There is no history of diabetes or stroke.  He was drinking coffee heavily on the average of one pot a day. Since his diagnosis with atrial fibrillation, he switched to decaf. Home Sleep study showed moderate sleep apnea. 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 Atrial fibrillation has been well controlled on diltiazem. On August 4 while he was at work, he had a sudden episode of extreme dizziness, vertigo, loss of balance, diffuse sweating and nausea. This lasted for about one hour. He did not go to the hospital. Next day, he had problems drifting to the right. No other neurologic symptoms. He had an outpatient CT scan of the head which showed chronic ischemic microvascular disease. 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 There was also small foci suggestive of small subacute PICA infarcts.  Recent last showed normal CBC except for borderline elevated platelet count. Renal function and liver functions were normal.  Allergies  Allergen Reactions  . Celebrex [Celecoxib]   . Penicillins     Hives      Current Outpatient Prescriptions on File Prior to Visit  Medication Sig Dispense Refill  . B Complex-C (SUPER B COMPLEX PO) Take by mouth daily.      . diclofenac (VOLTAREN) 75 MG EC tablet Take 75 mg by mouth daily.       Marland Kitchen loratadine (CLARITIN) 10 MG tablet Take 10 mg by mouth daily.      Marland Kitchen losartan  (COZAAR) 100 MG tablet Take 1 tablet (100 mg total) by mouth daily.  90 tablet  3  . magnesium oxide (MAG-OX) 400 MG tablet Take 400 mg by mouth daily.      . Melatonin 5 MG TABS Take by mouth as needed.       Marland Kitchen TAZTIA XT 360 MG 24 hr capsule TAKE 1 CAPSULE BY MOUTH DAILY.  30 capsule  3   No current facility-administered medications on file prior to visit.     Past Medical History  Diagnosis Date  . GERD (gastroesophageal reflux disease)   . Osteoarthrosis, unspecified whether generalized or localized, lower leg   . H/O Bell's palsy   . History of [place] measles   . Atrial fibrillation   . Hypertension   . Mini stroke      Past Surgical History  Procedure Laterality Date  . Cyst excision    . Trigger finger release       Family History  Problem Relation Age of Onset  . Hypertension Mother      History   Social History  . Marital Status: Married    Spouse Name: N/A    Number of Children: N/A  . Years of Education: N/A   Occupational History  . Not on file.   Social History Main Topics  . Smoking status: Former Smoker -- 4.50 packs/day for 15 years    Types: Cigarettes  . Smokeless tobacco: Not on file  .  Alcohol Use: No  . Drug Use: Yes    Special: Marijuana     Comment: PAST  . Sexual Activity:    Other Topics Concern  . Not on file   Social History Narrative  . No narrative on file      PHYSICAL EXAM   BP 156/87  Pulse 66  Ht 5\' 7"  (1.702 m)  Wt 258 lb 4 oz (117.141 kg)  BMI 40.44 kg/m2 Constitutional: He is oriented to person, place, and time. He appears well-developed and well-nourished. No distress.  HENT: No nasal discharge.  Head: Normocephalic and atraumatic.  Eyes: Pupils are equal and round. Right eye exhibits no discharge. Left eye exhibits no discharge.  Neck: Normal range of motion. Neck supple. No JVD present. No thyromegaly present.  Cardiovascular: Normal rate,regular rhythm, normal heart sounds and. Exam reveals no gallop  and no friction rub. No murmur heard.  Pulmonary/Chest: Effort normal and breath sounds normal. No stridor. No respiratory distress. He has no wheezes. He has no rales. He exhibits no tenderness.  Abdominal: Soft. Bowel sounds are normal. He exhibits no distension. There is no tenderness. There is no rebound and no guarding.  Musculoskeletal: Normal range of motion. He exhibits no edema and no tenderness.  Neurological: He is alert and oriented to person, place, and time. Coordination normal.  Skin: Skin is warm and dry. No rash noted. He is not diaphoretic. No erythema. No pallor.  Psychiatric: He has a normal mood and affect. His behavior is normal. Judgment and thought content normal.       EKG: Normal sinus rhythm with sinus  ASSESSMENT AND PLAN

## 2013-09-08 NOTE — Assessment & Plan Note (Signed)
I requested carotid Doppler. Results to be forwarded to Dr. Manuella Ghazi.

## 2013-09-23 ENCOUNTER — Encounter (INDEPENDENT_AMBULATORY_CARE_PROVIDER_SITE_OTHER): Payer: 59

## 2013-09-23 DIAGNOSIS — R42 Dizziness and giddiness: Secondary | ICD-10-CM

## 2013-09-23 DIAGNOSIS — G459 Transient cerebral ischemic attack, unspecified: Secondary | ICD-10-CM

## 2013-10-04 DIAGNOSIS — I10 Essential (primary) hypertension: Secondary | ICD-10-CM | POA: Insufficient documentation

## 2013-10-04 DIAGNOSIS — E119 Type 2 diabetes mellitus without complications: Secondary | ICD-10-CM | POA: Insufficient documentation

## 2013-10-04 DIAGNOSIS — M1990 Unspecified osteoarthritis, unspecified site: Secondary | ICD-10-CM | POA: Insufficient documentation

## 2013-10-04 DIAGNOSIS — Z87898 Personal history of other specified conditions: Secondary | ICD-10-CM | POA: Insufficient documentation

## 2013-10-04 DIAGNOSIS — E785 Hyperlipidemia, unspecified: Secondary | ICD-10-CM | POA: Insufficient documentation

## 2013-10-04 DIAGNOSIS — G4730 Sleep apnea, unspecified: Secondary | ICD-10-CM | POA: Insufficient documentation

## 2013-10-04 DIAGNOSIS — K219 Gastro-esophageal reflux disease without esophagitis: Secondary | ICD-10-CM | POA: Insufficient documentation

## 2013-10-04 DIAGNOSIS — I639 Cerebral infarction, unspecified: Secondary | ICD-10-CM | POA: Insufficient documentation

## 2013-11-25 ENCOUNTER — Other Ambulatory Visit: Payer: Self-pay | Admitting: Cardiovascular Disease

## 2013-12-09 ENCOUNTER — Ambulatory Visit (INDEPENDENT_AMBULATORY_CARE_PROVIDER_SITE_OTHER): Payer: 59 | Admitting: Cardiovascular Disease

## 2013-12-09 VITALS — BP 150/60 | HR 70

## 2013-12-09 DIAGNOSIS — I10 Essential (primary) hypertension: Secondary | ICD-10-CM

## 2013-12-09 DIAGNOSIS — I480 Paroxysmal atrial fibrillation: Secondary | ICD-10-CM

## 2013-12-09 DIAGNOSIS — E785 Hyperlipidemia, unspecified: Secondary | ICD-10-CM

## 2013-12-09 NOTE — Assessment & Plan Note (Signed)
He is maintaining in NSR and tolerating anticoagulation without side effects. Continue same medications.

## 2013-12-09 NOTE — Patient Instructions (Signed)
Your physician wants you to follow-up in: 6 months  You will receive a reminder letter in the mail two months in advance. If you don't receive a letter, please call our office to schedule the follow-up appointment.  Your physician recommends that you continue on your current medications as directed. Please refer to the Current Medication list given to you today.  

## 2013-12-09 NOTE — Assessment & Plan Note (Signed)
BP is mildly elevated. Continue to monitor.

## 2013-12-09 NOTE — Assessment & Plan Note (Signed)
Previous lipid profile showed a total cholesterol of 208, HDL 51, triglyceride of 78 and an LDL of 141. We should strongly consider a statin given his recent stroke.

## 2013-12-09 NOTE — Progress Notes (Signed)
Primary care physician: Dr. Keith Rake with [facility]  HPI  This is a pleasant 63 year old gentleman who is here today for followup visit regarding paroxysmal atrial fibrillation.  The patient has a long history of well-controlled hypertension and obesity. He was diagnosed with atrial fibrillation with rapid ventricular response during a routine physical  in December of 2013. He was sent to the emergency room at [facility]. His labs were unremarkable. Cardiac enzymes were negative. Thyroid function was normal.There is no history of diabetes or stroke.  He was drinking coffee heavily on the average of one pot a day. Since his diagnosis with atrial fibrillation, he switched to decaf. Home Sleep study showed moderate sleep apnea. 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 Atrial fibrillation has been well controlled on diltiazem. On August 4 while he was at work, he had a sudden episode of extreme dizziness, vertigo, loss of balance, diffuse sweating and nausea. This lasted for about one hour. He did not go to the hospital. Next day, he had problems drifting to the right. No other neurologic symptoms. He had an outpatient CT scan of the head which showed chronic ischemic microvascular disease. 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 There was also small foci suggestive of small subacute PICA infarcts.  He was started on anticoagulation with Xarelto. He has been doing well. No chest pain, dyspnea or palpitations. He had one brief episode of dizziness. No bleeding issues.   Allergies  Allergen Reactions  . Celebrex [Celecoxib]   . Penicillins     Hives      Current Outpatient Prescriptions on File Prior to Visit  Medication Sig Dispense Refill  . B Complex-C (SUPER B COMPLEX PO) Take by mouth daily.    . diclofenac (VOLTAREN) 75 MG EC tablet Take 75 mg by mouth daily.     Marland Kitchen loratadine (CLARITIN) 10 MG tablet Take  10 mg by mouth daily.    Marland Kitchen losartan (COZAAR) 100 MG tablet Take 1 tablet (100 mg total) by mouth daily. 90 tablet 3  . magnesium oxide (MAG-OX) 400 MG tablet Take 400 mg by mouth daily.    . Melatonin 5 MG TABS Take by mouth as needed.     Marland Kitchen omeprazole (PRILOSEC) 20 MG capsule Take 1 capsule (20 mg total) by mouth daily. 30 capsule 6  . rivaroxaban (XARELTO) 20 MG TABS tablet Take 1 tablet (20 mg total) by mouth daily with supper. 30 tablet 6  . TAZTIA XT 360 MG 24 hr capsule TAKE 1 CAPSULE BY MOUTH EVERY DAY 30 capsule 3   No current facility-administered medications on file prior to visit.     Past Medical History  Diagnosis Date  . GERD (gastroesophageal reflux disease)   . Osteoarthrosis, unspecified whether generalized or localized, lower leg   . H/O Bell's palsy   . History of [place] measles   . Atrial fibrillation   . Hypertension   . Mini stroke      Past Surgical History  Procedure Laterality Date  . Cyst excision    . Trigger finger release       Family History  Problem Relation Age of Onset  . Hypertension Mother      History   Social History  . Marital Status: Married    Spouse Name: N/A    Number of Children: N/A  . Years of Education: N/A  Occupational History  . Not on file.   Social History Main Topics  . Smoking status: Former Smoker -- 4.50 packs/day for 15 years    Types: Cigarettes  . Smokeless tobacco: Not on file  . Alcohol Use: No  . Drug Use: Yes    Special: Marijuana     Comment: PAST  . Sexual Activity: Not on file   Other Topics Concern  . Not on file   Social History Narrative  . No narrative on file      PHYSICAL EXAM   BP 150/60 mmHg  Pulse 70 Constitutional: He is oriented to person, place, and time. He appears well-developed and well-nourished. No distress.  HENT: No nasal discharge.  Head: Normocephalic and atraumatic.  Eyes: Pupils are equal and round. Right eye exhibits no discharge. Left eye exhibits no  discharge.  Neck: Normal range of motion. Neck supple. No JVD present. No thyromegaly present.  Cardiovascular: Normal rate,regular rhythm, normal heart sounds and. Exam reveals no gallop and no friction rub. No murmur heard.  Pulmonary/Chest: Effort normal and breath sounds normal. No stridor. No respiratory distress. He has no wheezes. He has no rales. He exhibits no tenderness.  Abdominal: Soft. Bowel sounds are normal. He exhibits no distension. There is no tenderness. There is no rebound and no guarding.  Musculoskeletal: Normal range of motion. He exhibits no edema and no tenderness.  Neurological: He is alert and oriented to person, place, and time. Coordination normal.  Skin: Skin is warm and dry. No rash noted. He is not diaphoretic. No erythema. No pallor.  Psychiatric: He has a normal mood and affect. His behavior is normal. Judgment and thought content normal.       EKG: Normal sinus rhythm with sinus  ASSESSMENT AND PLAN

## 2013-12-20 ENCOUNTER — Encounter: Payer: Self-pay | Admitting: Cardiovascular Disease

## 2014-01-04 ENCOUNTER — Ambulatory Visit: Payer: Self-pay | Admitting: Family Medicine

## 2014-02-02 ENCOUNTER — Other Ambulatory Visit: Payer: Self-pay | Admitting: Cardiovascular Disease

## 2014-03-25 ENCOUNTER — Other Ambulatory Visit: Payer: Self-pay | Admitting: Cardiovascular Disease

## 2014-06-09 ENCOUNTER — Ambulatory Visit (INDEPENDENT_AMBULATORY_CARE_PROVIDER_SITE_OTHER): Payer: 59 | Admitting: Cardiovascular Disease

## 2014-06-09 ENCOUNTER — Encounter: Payer: Self-pay | Admitting: Cardiovascular Disease

## 2014-06-09 VITALS — BP 140/60 | HR 67 | Ht 66.0 in | Wt 260.2 lb

## 2014-06-09 DIAGNOSIS — I10 Essential (primary) hypertension: Secondary | ICD-10-CM

## 2014-06-09 DIAGNOSIS — M1990 Unspecified osteoarthritis, unspecified site: Secondary | ICD-10-CM | POA: Insufficient documentation

## 2014-06-09 DIAGNOSIS — I480 Paroxysmal atrial fibrillation: Secondary | ICD-10-CM

## 2014-06-09 NOTE — Patient Instructions (Signed)
Medication Instructions: Continue same medications.   Labwork: None.   Procedures/Testing: None.   Follow-Up: 6 months with Dr. Natsha Guidry.   Any Additional Special Instructions Will Be Listed Below (If Applicable).   

## 2014-06-09 NOTE — Assessment & Plan Note (Signed)
I asked him to discuss alternatives to acetaminophen and NSAIDs with Dr.Shah.  Tramadol might be an option.

## 2014-06-09 NOTE — Assessment & Plan Note (Signed)
He is maintaining in sinus rhythm with high dose diltiazem with no evidence of recurrent arrhythmia. He also has been using his CPAP regularly for sleep apnea. He is tolerating anticoagulation with no reported side effects.

## 2014-06-09 NOTE — Progress Notes (Signed)
Primary care physician: Dr. Keith Rake with [facility]  HPI  This is a pleasant 64 year old gentleman who is here today for followup visit regarding paroxysmal atrial fibrillation.  The patient has a long history of well-controlled hypertension and obesity. He was diagnosed with atrial fibrillation with rapid ventricular response during a routine physical  in December of 2013. He was sent to the emergency room at [facility]. His labs were unremarkable. Cardiac enzymes were negative. Thyroid function was normal.There is no history of diabetes or stroke.  He was drinking coffee heavily on the average of one pot a day. Since his diagnosis with atrial fibrillation, he switched to decaf. Home Sleep study showed moderate sleep apnea. 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 Atrial fibrillation has been well controlled on diltiazem. In August, 2015 while he was at work, he had a sudden episode of extreme dizziness, vertigo, loss of balance, diffuse sweating and nausea. This lasted for about one hour. He did not go to the hospital. Next day, he had problems drifting to the right. No other neurologic symptoms. He had an outpatient CT scan of the head which showed chronic ischemic microvascular disease. MRI showed a 6 mm subacute infarct in the medial left  cerebellum. There was also small foci suggestive of small subacute PICA infarcts.  He has been tolerating anticoagulation with Xarelto. He has been doing well. No chest pain, dyspnea or palpitations.  He complains of arthritis pain. He used to take NSAIDs in the past but now cannot take these given that he is on anticoagulation. He reports that Tylenol has not been adequate in controlling his pain.  Allergies  Allergen Reactions  . Celebrex [Celecoxib]   . Penicillins     Hives      Current Outpatient Prescriptions on File Prior to Visit  Medication Sig Dispense Refill  . B Complex-C (SUPER B  COMPLEX PO) Take by mouth daily.    Marland Kitchen loratadine (CLARITIN) 10 MG tablet Take 10 mg by mouth daily.    Marland Kitchen losartan (COZAAR) 100 MG tablet TAKE 1 TABLET BY MOUTH EVERY DAY 90 tablet 3  . magnesium oxide (MAG-OX) 400 MG tablet Take 400 mg by mouth daily.    . Melatonin 5 MG TABS Take by mouth as needed.     Marland Kitchen omeprazole (PRILOSEC) 20 MG capsule Take 1 capsule (20 mg total) by mouth daily. 30 capsule 6  . TAZTIA XT 360 MG 24 hr capsule TAKE ONE CAPSULE BY MOUTH EVERY DAY 30 capsule 6  . XARELTO 20 MG TABS tablet TAKE 1 TABLET BY MOUTH EVERY DAY WITH SUPPER 30 tablet 6   No current facility-administered medications on file prior to visit.     Past Medical History  Diagnosis Date  . GERD (gastroesophageal reflux disease)   . Osteoarthrosis, unspecified whether generalized or localized, lower leg   . H/O Bell's palsy   . History of [place] measles   . Atrial fibrillation   . Hypertension   . Mini stroke      Past Surgical History  Procedure Laterality Date  . Cyst excision    . Trigger finger release       Family History  Problem Relation Age of Onset  . Hypertension Mother      History   Social History  . Marital Status: Married    Spouse Name: N/A  . Number of Children: N/A  . Years of Education: N/A  Occupational History  . Not on file.   Social History Main Topics  . Smoking status: Former Smoker -- 4.50 packs/day for 15 years    Types: Cigarettes  . Smokeless tobacco: Not on file  . Alcohol Use: No  . Drug Use: Yes    Special: Marijuana     Comment: PAST  . Sexual Activity: Not on file   Other Topics Concern  . Not on file   Social History Narrative      PHYSICAL EXAM   BP 140/60 mmHg  Pulse 67  Ht 5\' 6"  (1.676 m)  Wt 260 lb 4 oz (118.049 kg)  BMI 42.03 kg/m2 Constitutional: He is oriented to person, place, and time. He appears well-developed and well-nourished. No distress.  HENT: No nasal discharge.  Head: Normocephalic and atraumatic.    Eyes: Pupils are equal and round. Right eye exhibits no discharge. Left eye exhibits no discharge.  Neck: Normal range of motion. Neck supple. No JVD present. No thyromegaly present.  Cardiovascular: Normal rate,regular rhythm, normal heart sounds and. Exam reveals no gallop and no friction rub. No murmur heard.  Pulmonary/Chest: Effort normal and breath sounds normal. No stridor. No respiratory distress. He has no wheezes. He has no rales. He exhibits no tenderness.  Abdominal: Soft. Bowel sounds are normal. He exhibits no distension. There is no tenderness. There is no rebound and no guarding.  Musculoskeletal: Normal range of motion. He exhibits no edema and no tenderness.  Neurological: He is alert and oriented to person, place, and time. Coordination normal.  Skin: Skin is warm and dry. No rash noted. He is not diaphoretic. No erythema. No pallor.  Psychiatric: He has a normal mood and affect. His behavior is normal. Judgment and thought content normal.       EKG: Sinus  Rhythm  Low voltage -possible pulmonary disease.   ABNORMAL   ASSESSMENT AND PLAN

## 2014-06-09 NOTE — Assessment & Plan Note (Signed)
Blood pressure is reasonably controlled on current medications. 

## 2014-06-30 ENCOUNTER — Emergency Department: Payer: 59

## 2014-06-30 ENCOUNTER — Encounter: Payer: Self-pay | Admitting: Emergency Medicine

## 2014-06-30 ENCOUNTER — Emergency Department
Admission: EM | Admit: 2014-06-30 | Discharge: 2014-06-30 | Disposition: A | Discharge status: Home / Self Care | Payer: 59 | Attending: Emergency Medicine | Admitting: Emergency Medicine

## 2014-06-30 DIAGNOSIS — I10 Essential (primary) hypertension: Secondary | ICD-10-CM | POA: Diagnosis not present

## 2014-06-30 DIAGNOSIS — Z880 Allergy status to penicillin: Secondary | ICD-10-CM | POA: Insufficient documentation

## 2014-06-30 DIAGNOSIS — Z87891 Personal history of nicotine dependence: Secondary | ICD-10-CM | POA: Diagnosis not present

## 2014-06-30 DIAGNOSIS — H532 Diplopia: Secondary | ICD-10-CM | POA: Insufficient documentation

## 2014-06-30 LAB — COMPREHENSIVE METABOLIC PANEL
ALT: 21 U/L (ref 17–63)
AST: 27 U/L (ref 15–41)
Albumin: 4.6 g/dL (ref 3.5–5.0)
Alkaline Phosphatase: 95 U/L (ref 38–126)
Anion gap: 8 (ref 5–15)
BUN: 17 mg/dL (ref 6–20)
CO2: 29 mmol/L (ref 22–32)
Calcium: 9.5 mg/dL (ref 8.9–10.3)
Chloride: 100 mmol/L — ABNORMAL LOW (ref 101–111)
Creatinine, Ser: 0.99 mg/dL (ref 0.61–1.24)
GFR calc Af Amer: >60 mL/min (ref >60)
GFR calc non Af Amer: >60 mL/min (ref >60)
Glucose, Bld: 111 mg/dL — ABNORMAL HIGH (ref 65–99)
Potassium: 4.1 mmol/L (ref 3.5–5.1)
Sodium: 137 mmol/L (ref 135–145)
Total Bilirubin: 0.5 mg/dL (ref 0.3–1.2)
Total Protein: 9 g/dL — ABNORMAL HIGH (ref 6.5–8.1)

## 2014-06-30 LAB — CBC
HCT: 43.8 % (ref 40.0–52.0)
Hemoglobin: 14.6 g/dL (ref 13.0–18.0)
MCH: 29 pg (ref 26.0–34.0)
MCHC: 33.3 g/dL (ref 32.0–36.0)
MCV: 87.1 fL (ref 80.0–100.0)
Platelets: 350 10*3/uL (ref 150–440)
RBC: 5.04 MIL/uL (ref 4.40–5.90)
RDW: 13.5 % (ref 11.5–14.5)
WBC: 7.9 10*3/uL (ref 3.8–10.6)

## 2014-06-30 NOTE — ED Provider Notes (Signed)
[facility] Emergency Department Provider Note     Time seen: ----------------------------------------- 4:57 PM on 06/30/2014 -----------------------------------------    I have reviewed the triage vital signs and the nursing notes.   HISTORY  Chief Complaint Eye Problem    HPI Mason Houston is a 64 y.o. male who presents ER for binocular double vision since Wednesday night. Patient doesn't history of stroke for which she takes Xarelto. Denies any headache weakness numbness tingling difficulty swallowing or speaking. Patient states the double vision doesn't start until about 5 feet in front of him, thinks her up closely can see clearly. Nothing seems to make it better   Past Medical History  Diagnosis Date  . GERD (gastroesophageal reflux disease)   . Osteoarthrosis, unspecified whether generalized or localized, lower leg   . H/O Bell's palsy   . History of [place] measles   . Atrial fibrillation   . Hypertension   . Mini stroke     Patient Active Problem List   Diagnosis Date Noted  . Arthritis 06/09/2014  . Stroke 09/08/2013  . Obstructive sleep apnea 02/03/2013  . Depression 07/05/2012  . Hyperlipemia 12/23/2011  . Atrial fibrillation   . Hypertension     Past Surgical History  Procedure Laterality Date  . Cyst excision    . Trigger finger release      Allergies Celebrex and Penicillins  Social History History  Substance Use Topics  . Smoking status: Former Smoker -- 4.50 packs/day for 15 years    Types: Cigarettes  . Smokeless tobacco: Not on file  . Alcohol Use: No    Review of Systems Constitutional: Negative for fever. Eyes: Negative for visual changes. ENT: Negative for sore throat. Cardiovascular: Negative for chest pain. Respiratory: Negative for shortness of breath. Gastrointestinal: Negative for abdominal pain, vomiting and diarrhea. Genitourinary: Negative for dysuria. Musculoskeletal: Negative for back  pain. Skin: Negative for rash. Neurological: Negative for headaches, focal weakness or numbness. Positive for double vision  10-point ROS otherwise negative.  ____________________________________________   PHYSICAL EXAM:  VITAL SIGNS: ED Triage Vitals  Enc Vitals Group     BP 06/30/14 1433 174/86 mmHg     Pulse Rate 06/30/14 1433 85     Resp 06/30/14 1433 20     Temp 06/30/14 1433 98 F (36.7 C)     Temp Source 06/30/14 1433 Oral     SpO2 06/30/14 1433 99 %     Weight 06/30/14 1433 260 lb (117.935 kg)     Height 06/30/14 1433 5\' 6"  (1.676 m)     Head Cir --      [place] Flow --      Pain Score 06/30/14 1434 0     Pain Loc --      Pain Edu? --      Excl. in [place]? --     Constitutional: Alert and oriented. Well appearing and in no distress. Eyes: Conjunctivae are normal. PERRL. Normal extraocular movements. ENT   Head: Normocephalic and atraumatic.   Nose: No congestion/rhinnorhea.   Mouth/Throat: Mucous membranes are moist.   Neck: No stridor. Hematological/Lymphatic/Immunilogical: No cervical lymphadenopathy. Cardiovascular: Normal rate, regular rhythm. Normal and symmetric distal pulses are present in all extremities. No murmurs, rubs, or gallops. Respiratory: Normal respiratory effort without tachypnea nor retractions. Breath sounds are clear and equal bilaterally. No wheezes/rales/rhonchi. Gastrointestinal: Soft and nontender. No distention. No abdominal bruits. There is no CVA tenderness. Musculoskeletal: Nontender with normal range of motion in all extremities. No joint effusions.  No lower extremity tenderness nor edema. Neurologic:  Normal speech and language. No gross focal neurologic deficits are appreciated. Speech is normal. No gait instability. Skin:  Skin is warm, dry and intact. No rash noted. Psychiatric: Mood and affect are normal. Speech and behavior are normal. Patient exhibits appropriate insight and  judgment.  ____________________________________________  ED COURSE:  Pertinent labs & imaging results that were available during my care of the patient were reviewed by me and considered in my medical decision making (see chart for details). Patient will need CT head, likely ophthalmology consultation ____________________________________________    LABS (pertinent positives/negatives)  Labs Reviewed  COMPREHENSIVE METABOLIC PANEL - Abnormal; Notable for the following:    Chloride 100 (*)    Glucose, Bld 111 (*)    Total Protein 9.0 (*)    All other components within normal limits  CBC    RADIOLOGY Images were viewed by me  IMPRESSION: 1. No acute intracranial findings. 2. Periventricular white matter and corona radiata hypodensities favor chronic ischemic microvascular white matter disease.  ____________________________________________  FINAL ASSESSMENT AND PLAN  Diplopia  Plan: I discussed case with ophthalmology on-call, patient appears to have normal extraocular movements. Was advised to can follow-up in the office on Monday at 8 or 8:15 AM. Patient is agreeable to this, neuro is intact other than the double vision 5 feet from his body. Advised him to continue taking Xarelto, return for worsening or worrisome symptoms.   Earleen Newport, MD   Earleen Newport, MD 06/30/14 [phone]

## 2014-06-30 NOTE — ED Notes (Signed)
Pt to ed with c/o double vision since wed night.  Hx of stroke.  Denies headache. Denies weakness.

## 2014-06-30 NOTE — Discharge Instructions (Signed)
Diplopia  °Double vision (diplopia) means that you are seeing two of everything. Diplopia usually occurs with both eyes open, and may be worse when looking in one particular direction. If both eyes must be open to see double, this is called binocular diplopia. If double images are seen in just one eye, this is called monocular diplopia.  °CAUSES  °Binocular Diplopia °· Disorder affecting the muscles that move the eyes or the nerves that control those muscles. °· Tumor or other mass pushing on an eye from beside or behind the eye. °· Myasthenia gravis. This is a neuromuscular illness that causes the body's muscles to tire easily. The eye muscles and eyelid muscles become weak. The eyes do not track together well. °· Grave's disease. This is an overactivity of the thyroid gland. This condition causes swelling of tissues around the eyes. This produces a bulging out of the eyeball. °· Blowout fracture of the bone around the eye. The muscles of the eye socket are damaged. This often happens when the eye is hit with force. °· Complications after certain surgeries, such as a lens implant after cataract surgery. °· Fluid-filled mass (abscess) behind or beside the eye, infection, or abnormal connection between arteries and veins. °Sometimes, no cause is found. °Monocular Diplopia °· Problems with corrective lens or contacts. °· Corneal abrasion, infection, severe injury, or bulging and irregularity of the corneal surface (keratoconus). °· Irregularities of the pupil from drugs, severe injury, or other causes. °· Problems involving the lens of the eye, such as opacities or cataracts. °· Complications after certain surgeries, such as a lens implant after cataract surgery. °· Retinal detachment or problems involving the blood vessels of the retina. °Sometimes, no cause is found. °SYMPTOMS  °Binocular Diplopia °· When one eye is closed or covered, the double images disappear. °Monocular Diplopia °· When the unaffected eye is  closed or covered, the double images remain. The double images disappear when the affected eye is closed or covered. °DIAGNOSIS  °A diagnosis is made during an eye exam. °TREATMENT  °Treatment depends on the cause or underlying disease.  °· Relief of double vision symptoms may be achieved by patching one eye or by using special glasses. °· Surgery on the muscles of the eye may be needed. °SEEK IMMEDIATE MEDICAL CARE IF:  °· You see two images of a single object you are looking at, either with both eyes open or with just one eye open. °Document Released: 10/25/2003 Document Revised: 03/17/2011 Document Reviewed: 08/11/2007 °ExitCare® Patient Information ©2015 [employer]. This information is not intended to replace advice given to you by your health care provider. Make sure you discuss any questions you have with your health care provider. ° °

## 2014-07-31 ENCOUNTER — Other Ambulatory Visit: Payer: Self-pay | Admitting: Ophthalmology

## 2014-07-31 DIAGNOSIS — H5022 Vertical strabismus, left eye: Secondary | ICD-10-CM

## 2014-08-01 ENCOUNTER — Ambulatory Visit
Admission: RE | Admit: 2014-08-01 | Discharge: 2014-08-01 | Disposition: A | Discharge status: Home / Self Care | Payer: 59 | Source: Ambulatory Visit | Attending: Ophthalmology | Admitting: Ophthalmology

## 2014-08-01 ENCOUNTER — Ambulatory Visit: Admission: RE | Admit: 2014-08-01 | Payer: 59 | Source: Ambulatory Visit

## 2014-08-01 DIAGNOSIS — H5022 Vertical strabismus, left eye: Secondary | ICD-10-CM | POA: Diagnosis present

## 2014-08-01 MED ORDER — GADOBENATE DIMEGLUMINE 529 MG/ML IV SOLN
20.0000 mL | Freq: Once | INTRAVENOUS | Status: AC | PRN
  Administered 2014-08-01: 20 mL via INTRAVENOUS

## 2014-10-26 ENCOUNTER — Other Ambulatory Visit: Payer: Self-pay | Admitting: Cardiovascular Disease

## 2014-11-25 ENCOUNTER — Other Ambulatory Visit: Payer: Self-pay | Admitting: Cardiovascular Disease

## 2014-11-27 ENCOUNTER — Encounter: Payer: Self-pay | Admitting: Cardiovascular Disease

## 2014-11-27 ENCOUNTER — Ambulatory Visit (INDEPENDENT_AMBULATORY_CARE_PROVIDER_SITE_OTHER): Payer: 59 | Admitting: Cardiovascular Disease

## 2014-11-27 VITALS — BP 140/62 | HR 63 | Ht 67.0 in | Wt 257.8 lb

## 2014-11-27 DIAGNOSIS — I10 Essential (primary) hypertension: Secondary | ICD-10-CM

## 2014-11-27 DIAGNOSIS — I480 Paroxysmal atrial fibrillation: Secondary | ICD-10-CM

## 2014-11-27 MED ORDER — DILTIAZEM HCL ER BEADS 360 MG PO CP24: ORAL_CAPSULE | ORAL | Status: DC

## 2014-11-27 NOTE — Assessment & Plan Note (Signed)
He is maintaining in sinus rhythm on high-dose diltiazem and tolerating anticoagulation with Xarelto.

## 2014-11-27 NOTE — Patient Instructions (Addendum)
Medication Instructions: Continue same medications.   Labwork: None.   Procedures/Testing: None.   Follow-Up: 6 months follow up with Dr. Khylei Wilms.   Any Additional Special Instructions Will Be Listed Below (If Applicable).   

## 2014-11-27 NOTE — Assessment & Plan Note (Signed)
His blood pressure is well controlled on current medications. He is trying to follow a healthy lifestyle and has been gradually losing weight. Continue to monitor and decrease the dosages of medications as needed if he continues to lose weight.

## 2014-11-27 NOTE — Progress Notes (Signed)
Primary care physician: Dr. Keith Rake with [facility]  HPI  This is a pleasant 64 year old gentleman who is here today for followup visit regarding paroxysmal atrial fibrillation.  The patient has a long history of well-controlled hypertension and obesity. He was diagnosed with atrial fibrillation with rapid ventricular response during a routine physical  in December of 2013 in the setting of heavy caffeine intake. He also has sleep apnea currently effectively treated with CPAP.  He had a stroke in August 2015. Since then, he has been on anticoagulation with  Xarelto. In July, he complained of double vision and went to the emergency room where MRI showed no significant change from before. 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 He has been doing well with no chest pain, shortness of breath or palpitations.  Allergies  Allergen Reactions  . Celebrex [Celecoxib]   . Penicillins     Hives      Current Outpatient Prescriptions on File Prior to Visit  Medication Sig Dispense Refill  . B Complex-C (SUPER B COMPLEX PO) Take by mouth daily.    Marland Kitchen loratadine (CLARITIN) 10 MG tablet Take 10 mg by mouth daily.    Marland Kitchen losartan (COZAAR) 100 MG tablet TAKE 1 TABLET BY MOUTH EVERY DAY 90 tablet 3  . magnesium oxide (MAG-OX) 400 MG tablet Take 400 mg by mouth daily.    . Melatonin 5 MG TABS Take by mouth as needed.     . ranitidine (ZANTAC) 150 MG tablet Take 150 mg by mouth at bedtime.    Alveda Reasons 20 MG TABS tablet TAKE 1 TABLET BY MOUTH EVERY DAY WITH DINNER 30 tablet 6   No current facility-administered medications on file prior to visit.     Past Medical History  Diagnosis Date  . GERD (gastroesophageal reflux disease)   . Osteoarthrosis, unspecified whether generalized or localized, lower leg   . H/O Bell's palsy   . History of [place] measles   . Atrial fibrillation ([place])   . Hypertension   . Mini stroke ([place])   . Sixth  cranial nerve palsy      Past Surgical History  Procedure Laterality Date  . Cyst excision    . Trigger finger release       Family History  Problem Relation Age of Onset  . Hypertension Mother      Social History   Social History  . Marital Status: Married    Spouse Name: N/A  . Number of Children: N/A  . Years of Education: N/A   Occupational History  . Not on file.   Social History Main Topics  . Smoking status: Former Smoker -- 4.50 packs/day for 15 years    Types: Cigarettes  . Smokeless tobacco: Not on file  . Alcohol Use: No  . Drug Use: Yes    Special: Marijuana     Comment: PAST  . Sexual Activity: Not on file   Other Topics Concern  . Not on file   Social History Narrative      PHYSICAL EXAM   BP 140/62 mmHg  Pulse 63  Ht 5\' 7"  (1.702 m)  Wt 257 lb 12 oz (116.915 kg)  BMI 40.36 kg/m2 Constitutional: He is oriented to person, place, and time. He appears well-developed and well-nourished. No distress.  HENT: No nasal discharge.  Head: Normocephalic and atraumatic.  Eyes: Pupils are equal and round. Neck: Normal range of motion. Neck supple. No JVD  present. No thyromegaly present.  Cardiovascular: Normal rate,regular rhythm, normal heart sounds and. Exam reveals no gallop and no friction rub. No murmur heard.  Pulmonary/Chest: Effort normal and breath sounds normal. No stridor. No respiratory distress. He has no wheezes. He has no rales. He exhibits no tenderness.  Abdominal: Soft. Bowel sounds are normal. He exhibits no distension. There is no tenderness. There is no rebound and no guarding.  Musculoskeletal: Normal range of motion. He exhibits no edema and no tenderness.  Neurological: He is alert and oriented to person, place, and time. Coordination normal.  Skin: Skin is warm and dry. No rash noted. He is not diaphoretic. No erythema. No pallor.  Psychiatric: He has a normal mood and affect. His behavior is normal. Judgment and thought  content normal.       EKG: Normal sinus rhythm with no significant ST or T wave changes.   ASSESSMENT AND PLAN

## 2014-12-07 ENCOUNTER — Telehealth: Payer: Self-pay

## 2014-12-07 NOTE — Telephone Encounter (Signed)
S/w pt who states starting Jan 1, prescription copay will not cover xarelto if there is a generic blood thinner acceptable for pt.  He takes xarelto 20mg  qd for afib and is tolerating well. He would like to continue w/med but is unable to pay $465/month if there is another acceptable generic blood thinner. He is aware xarelto has no generic. Pt would like to know if Dr. Fletcher Anon could advise.   Per verbal from Dr. Fletcher Anon, there is no acceptable generic that he would prescribe for the patient.   S/w pt of MD recommendations. Informed pt to notify us if any paperwork needs to be completed.  He is due for refill at the end of Dec and states he will have enough through January but will let us know if he needs samples to avoid any missed doses. Pt had no further questions.

## 2014-12-07 NOTE — Telephone Encounter (Signed)
Pt is on Xarelto, but would like a different medication that is generic. Please call. Pt does not want Warfarin. 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

## 2014-12-08 ENCOUNTER — Telehealth: Payer: Self-pay

## 2014-12-08 NOTE — Telephone Encounter (Signed)
Pt called back, states that he is going to be okay with the Xarelto, states his insurance will cover it, his deductible will just be higher.

## 2014-12-28 ENCOUNTER — Inpatient Hospital Stay
Admission: EM | Admit: 2014-12-28 | Discharge: 2014-12-30 | DRG: 446 | Disposition: A | Discharge status: Home / Self Care | Payer: 59 | Attending: Specialist | Admitting: Specialist

## 2014-12-28 ENCOUNTER — Emergency Department: Payer: 59

## 2014-12-28 DIAGNOSIS — Z87891 Personal history of nicotine dependence: Secondary | ICD-10-CM

## 2014-12-28 DIAGNOSIS — D378 Neoplasm of uncertain behavior of other specified digestive organs: Secondary | ICD-10-CM | POA: Diagnosis present

## 2014-12-28 DIAGNOSIS — K831 Obstruction of bile duct: Secondary | ICD-10-CM | POA: Diagnosis not present

## 2014-12-28 DIAGNOSIS — Z8673 Personal history of transient ischemic attack (TIA), and cerebral infarction without residual deficits: Secondary | ICD-10-CM

## 2014-12-28 DIAGNOSIS — R1900 Intra-abdominal and pelvic swelling, mass and lump, unspecified site: Secondary | ICD-10-CM

## 2014-12-28 DIAGNOSIS — I482 Chronic atrial fibrillation: Secondary | ICD-10-CM | POA: Diagnosis present

## 2014-12-28 DIAGNOSIS — Z8249 Family history of ischemic heart disease and other diseases of the circulatory system: Secondary | ICD-10-CM

## 2014-12-28 DIAGNOSIS — Z7901 Long term (current) use of anticoagulants: Secondary | ICD-10-CM | POA: Diagnosis not present

## 2014-12-28 DIAGNOSIS — Z880 Allergy status to penicillin: Secondary | ICD-10-CM | POA: Diagnosis not present

## 2014-12-28 DIAGNOSIS — I10 Essential (primary) hypertension: Secondary | ICD-10-CM | POA: Diagnosis present

## 2014-12-28 DIAGNOSIS — D869 Sarcoidosis, unspecified: Secondary | ICD-10-CM | POA: Diagnosis not present

## 2014-12-28 DIAGNOSIS — M179 Osteoarthritis of knee, unspecified: Secondary | ICD-10-CM | POA: Diagnosis present

## 2014-12-28 DIAGNOSIS — R17 Unspecified jaundice: Secondary | ICD-10-CM

## 2014-12-28 DIAGNOSIS — K219 Gastro-esophageal reflux disease without esophagitis: Secondary | ICD-10-CM | POA: Diagnosis present

## 2014-12-28 LAB — URINALYSIS COMPLETE WITH MICROSCOPIC (ARMC ONLY)
Bacteria, UA: NONE SEEN
Glucose, UA: NEGATIVE mg/dL
Hgb urine dipstick: NEGATIVE
Ketones, ur: NEGATIVE mg/dL
Leukocytes, UA: NEGATIVE
Nitrite: NEGATIVE
Protein, ur: NEGATIVE mg/dL
Specific Gravity, Urine: 1.021 (ref 1.005–1.030)
pH: 5 (ref 5.0–8.0)

## 2014-12-28 LAB — APTT: aPTT: 41 seconds — ABNORMAL HIGH (ref 24–36)

## 2014-12-28 LAB — HEPATIC FUNCTION PANEL
ALT: 278 U/L — ABNORMAL HIGH (ref 17–63)
AST: 128 U/L — ABNORMAL HIGH (ref 15–41)
Albumin: 3.8 g/dL (ref 3.5–5.0)
Alkaline Phosphatase: 327 U/L — ABNORMAL HIGH (ref 38–126)
Bilirubin, Direct: 8.9 mg/dL — ABNORMAL HIGH (ref 0.1–0.5)
Indirect Bilirubin: 5.1 mg/dL — ABNORMAL HIGH (ref 0.3–0.9)
Total Bilirubin: 14 mg/dL — ABNORMAL HIGH (ref 0.3–1.2)
Total Protein: 8.1 g/dL (ref 6.5–8.1)

## 2014-12-28 LAB — BASIC METABOLIC PANEL
Anion gap: 7 (ref 5–15)
BUN: 27 mg/dL — ABNORMAL HIGH (ref 6–20)
CO2: 22 mmol/L (ref 22–32)
Calcium: 9.3 mg/dL (ref 8.9–10.3)
Chloride: 106 mmol/L (ref 101–111)
Creatinine, Ser: 0.75 mg/dL (ref 0.61–1.24)
GFR calc Af Amer: >60 mL/min (ref >60)
GFR calc non Af Amer: >60 mL/min (ref >60)
Glucose, Bld: 194 mg/dL — ABNORMAL HIGH (ref 65–99)
Potassium: 3.8 mmol/L (ref 3.5–5.1)
Sodium: 135 mmol/L (ref 135–145)

## 2014-12-28 LAB — PROTIME-INR
INR: 1.49
Prothrombin Time: 18.1 seconds — ABNORMAL HIGH (ref 11.4–15.0)

## 2014-12-28 LAB — CBC
HCT: 41.1 % (ref 40.0–52.0)
Hemoglobin: 13.5 g/dL (ref 13.0–18.0)
MCH: 27.9 pg (ref 26.0–34.0)
MCHC: 32.9 g/dL (ref 32.0–36.0)
MCV: 84.7 fL (ref 80.0–100.0)
Platelets: 290 10*3/uL (ref 150–440)
RBC: 4.85 MIL/uL (ref 4.40–5.90)
RDW: 17 % — ABNORMAL HIGH (ref 11.5–14.5)
WBC: 7.6 10*3/uL (ref 3.8–10.6)

## 2014-12-28 LAB — AMMONIA: Ammonia: 47 umol/L — ABNORMAL HIGH (ref 9–35)

## 2014-12-28 MED ORDER — SODIUM CHLORIDE 0.9 % IV BOLUS (SEPSIS)
500.0000 mL | Freq: Once | INTRAVENOUS | Status: AC
  Administered 2014-12-28: 500 mL via INTRAVENOUS

## 2014-12-28 MED ORDER — SODIUM CHLORIDE 0.9 % IJ SOLN
3.0000 mL | Freq: Two times a day (BID) | INTRAMUSCULAR | Status: DC
  Administered 2014-12-28 – 2014-12-30 (×4): 3 mL via INTRAVENOUS

## 2014-12-28 MED ORDER — LORATADINE 10 MG PO TABS
10.0000 mg | ORAL_TABLET | Freq: Every day | ORAL | Status: DC
  Administered 2014-12-30: 10 mg via ORAL
  Filled 2014-12-28: qty 1

## 2014-12-28 MED ORDER — ONDANSETRON HCL 4 MG PO TABS: 4.0000 mg | ORAL_TABLET | Freq: Four times a day (QID) | ORAL | Status: DC | PRN

## 2014-12-28 MED ORDER — ONDANSETRON HCL 4 MG/2ML IJ SOLN: 4.0000 mg | Freq: Four times a day (QID) | INTRAMUSCULAR | Status: DC | PRN

## 2014-12-28 MED ORDER — METHOCARBAMOL 500 MG PO TABS: 750.0000 mg | ORAL_TABLET | Freq: Two times a day (BID) | ORAL | Status: DC

## 2014-12-28 MED ORDER — SODIUM CHLORIDE 0.9 % IJ SOLN: 3.0000 mL | INTRAMUSCULAR | Status: DC | PRN

## 2014-12-28 MED ORDER — IOHEXOL 240 MG/ML SOLN
25.0000 mL | Freq: Once | INTRAMUSCULAR | Status: AC | PRN
  Administered 2014-12-28: 25 mL via ORAL

## 2014-12-28 MED ORDER — IOHEXOL 300 MG/ML  SOLN
100.0000 mL | Freq: Once | INTRAMUSCULAR | Status: AC | PRN
  Administered 2014-12-28: 100 mL via INTRAVENOUS

## 2014-12-28 MED ORDER — LOSARTAN POTASSIUM 50 MG PO TABS
100.0000 mg | ORAL_TABLET | Freq: Every day | ORAL | Status: DC
  Administered 2014-12-28 – 2014-12-29 (×2): 100 mg via ORAL
  Filled 2014-12-28 (×3): qty 2

## 2014-12-28 MED ORDER — METHOCARBAMOL 500 MG PO TABS: 750.0000 mg | ORAL_TABLET | Freq: Two times a day (BID) | ORAL | Status: DC | PRN

## 2014-12-28 MED ORDER — DILTIAZEM HCL ER BEADS 240 MG PO CP24
360.0000 mg | ORAL_CAPSULE | Freq: Every day | ORAL | Status: DC
  Filled 2014-12-28: qty 1

## 2014-12-28 MED ORDER — FAMOTIDINE 20 MG PO TABS
20.0000 mg | ORAL_TABLET | Freq: Every day | ORAL | Status: DC
  Administered 2014-12-28 – 2014-12-29 (×2): 20 mg via ORAL
  Filled 2014-12-28 (×2): qty 1

## 2014-12-28 MED ORDER — MAGNESIUM OXIDE 400 (241.3 MG) MG PO TABS
400.0000 mg | ORAL_TABLET | Freq: Every day | ORAL | Status: DC
  Administered 2014-12-30: 400 mg via ORAL
  Filled 2014-12-28: qty 1

## 2014-12-28 MED ORDER — MAGNESIUM HYDROXIDE 400 MG/5ML PO SUSP: 30.0000 mL | Freq: Every day | ORAL | Status: DC | PRN

## 2014-12-28 MED ORDER — SODIUM CHLORIDE 0.9 % IV SOLN
250.0000 mL | INTRAVENOUS | Status: DC | PRN
  Administered 2014-12-29: 14:00:00 via INTRAVENOUS

## 2014-12-28 NOTE — ED Notes (Signed)
Pt states that he was been weak X 2 weeks. Pt sent to ER from PCP because pt is jaundiced. Sclera yellow. No hx of such. Pt alert and oriented X4, active, cooperative, pt in NAD. RR even and unlabored, color WNL.

## 2014-12-28 NOTE — ED Provider Notes (Signed)
Radiology calls back and says patient has either sphincter OD mass or pancreatic head mass. Patient's ammonia LFTs and are all elevated. Radiology recommends MRCP with and without contrast. We will admit the patient.  Nena Polio, MD 12/28/14 [phone]

## 2014-12-28 NOTE — ED Notes (Signed)
Pt states he takes 2 tylenol, 650mg  a day.

## 2014-12-28 NOTE — H&P (Signed)
[place] [facility] [place] NAME: Mason Houston    MR#:  [id number]  DATE OF BIRTH:  23-Sep-1950  DATE OF ADMISSION:  12/28/2014  PRIMARY CARE PHYSICIAN: Keith Rake, MD   REQUESTING/REFERRING PHYSICIAN: Rip Harbour  CHIEF COMPLAINT:   Generalized weakness and jaundice HISTORY OF PRESENT ILLNESS:  Mason Houston  is a 64 y.o. male with a known history of TIAs, hypertension, GERD and paroxysmal atrial fibrillation on Xarelto sent over to the ED from the primary care physician's office for painless jaundice which was first noticed 2 days ago and progressively getting worse with generalized weakness. Patient reports that he has significant weight loss of 35 pounds in the past few months as he is watching his diet and walking. Patient had EGD done 3-4 years ago at the time some spot was noticed . CT abdomen has revealed possible pancreatic mass and have recommending ERCP. I have discussed this with Dr. Candace Cruise who has suggested to make him nothing by mouth and hold Xarelto  PAST MEDICAL HISTORY:   Past Medical History  Diagnosis Date  . GERD (gastroesophageal reflux disease)   . Osteoarthrosis, unspecified whether generalized or localized, lower leg   . H/O Bell's palsy   . History of [place] measles   . Atrial fibrillation ([place])   . Hypertension   . Mini stroke ([place])   . Sixth cranial nerve palsy     PAST SURGICAL HISTOIRY:   Past Surgical History  Procedure Laterality Date  . Cyst excision    . Trigger finger release      SOCIAL HISTORY:   Social History  Substance Use Topics  . Smoking status: Former Smoker -- 4.50 packs/day for 15 years    Types: Cigarettes  . Smokeless tobacco: Not on file  . Alcohol Use: No    FAMILY HISTORY:   Family History  Problem Relation Age of Onset  . Hypertension Mother     DRUG ALLERGIES:   Allergies  Allergen Reactions  . Celebrex [Celecoxib]   . Penicillins     Hives     REVIEW OF  SYSTEMS:  CONSTITUTIONAL: No fever, reporting fatigue or weakness. Jaundiced EYES: No blurred or double vision.  EARS, NOSE, AND THROAT: No tinnitus or ear pain.  RESPIRATORY: No cough, shortness of breath, wheezing or hemoptysis.  CARDIOVASCULAR: No chest pain, orthopnea, edema.  GASTROINTESTINAL: No nausea, vomiting, diarrhea or abdominal pain.  GENITOURINARY: No dysuria, hematuria.  ENDOCRINE: No polyuria, nocturia,  HEMATOLOGY: No anemia, easy bruising or bleeding SKIN: No rash or lesion. MUSCULOSKELETAL: No joint pain or arthritis.   NEUROLOGIC: No tingling, numbness, weakness.  PSYCHIATRY: No anxiety or depression.   MEDICATIONS AT HOME:   Prior to Admission medications   Medication Sig Start Date End Date Taking? Authorizing Provider  B Complex-C (SUPER B COMPLEX PO) Take by mouth daily.   Yes Historical Provider, MD  diltiazem (TAZTIA XT) 360 MG 24 hr capsule TAKE 1 CAPSULE BY MOUTH EVERY DAY 11/27/14  Yes Wellington Hampshire, MD  loratadine (CLARITIN) 10 MG tablet Take 10 mg by mouth daily.   Yes Historical Provider, MD  losartan (COZAAR) 100 MG tablet TAKE 1 TABLET BY MOUTH EVERY DAY 02/02/14  Yes Wellington Hampshire, MD  magnesium oxide (MAG-OX) 400 MG tablet Take 400 mg by mouth daily.   Yes Historical Provider, MD  Melatonin 5 MG TABS Take by mouth as needed.    Yes Historical Provider, MD  methocarbamol (ROBAXIN) 750 MG  tablet Take 750 mg by mouth every 12 (twelve) hours.  09/19/14  Yes Historical Provider, MD  ranitidine (ZANTAC) 150 MG tablet Take 150 mg by mouth at bedtime.   Yes Historical Provider, MD  XARELTO 20 MG TABS tablet TAKE 1 TABLET BY MOUTH EVERY DAY WITH DINNER 10/26/14  Yes Wellington Hampshire, MD      VITAL SIGNS:  Blood pressure 137/77, pulse 70, temperature 98.2 F (36.8 C), temperature source Oral, resp. rate 17, height 5\' 6"  (1.676 m), weight 110.678 kg (244 lb), SpO2 98 %.  PHYSICAL EXAMINATION:  Positive GENERAL:  64 y.o.-year-old patient lying in the  bed with no acute distress.  EYES: Pupils equal, round, reactive to light and accommodation. Positive scleral icterus. Extraocular muscles intact.  HEENT: Head atraumatic, normocephalic. Oropharynx and nasopharynx clear.  NECK:  Supple, no jugular venous distention. No thyroid enlargement, no tenderness.  LUNGS: Normal breath sounds bilaterally, no wheezing, rales,rhonchi or crepitation. No use of accessory muscles of respiration.  CARDIOVASCULAR: S1, S2 normal. No murmurs, rubs, or gallops.  ABDOMEN: Soft, nontender, nondistended. Bowel sounds present. No organomegaly or mass.  EXTREMITIES: No pedal edema, cyanosis, or clubbing.  NEUROLOGIC: Cranial nerves II through XII are intact. Muscle strength 5/5 in all extremities. Sensation intact. Gait not checked.  PSYCHIATRIC: The patient is alert and oriented x 3.  SKIN: No obvious rash, lesion, or ulcer.  jaundiced   LABORATORY PANEL:   CBC  Recent Labs Lab 12/28/14 1233  WBC 7.6  HGB 13.5  HCT 41.1  PLT 290   ------------------------------------------------------------------------------------------------------------------  Chemistries   Recent Labs Lab 12/28/14 1233  NA 135  K 3.8  CL 106  CO2 22  GLUCOSE 194*  BUN 27*  CREATININE 0.75  CALCIUM 9.3  AST 128*  ALT 278*  ALKPHOS 327*  BILITOT 14.0*   ------------------------------------------------------------------------------------------------------------------  Cardiac Enzymes No results for input(s): TROPONINI in the last 168 hours. ------------------------------------------------------------------------------------------------------------------  RADIOLOGY:  Ct Abdomen Pelvis W Contrast  12/28/2014  CLINICAL DATA:  Weakness for 2 weeks. Painless jaundice for least 2 days. Initial encounter. EXAM: CT ABDOMEN AND PELVIS WITH CONTRAST TECHNIQUE: Multidetector CT imaging of the abdomen and pelvis was performed using the standard protocol following bolus administration  of intravenous contrast. CONTRAST:  100 mL OMNIPAQUE IOHEXOL 300 MG/ML  SOLN COMPARISON:  None. FINDINGS: The lung bases are clear.  No pleural or pericardial effusion. The gallbladder is mildly distended and there is mild intrahepatic biliary ductal dilatation. The common bile duct is also somewhat prominent. The duct can be traced to the pancreatic head where it appears obliterated just proximal to the sphincter of Oddi. The pancreatic head appears somewhat full and the pancreatic duct is minimally prominent. No focal liver lesion is identified. No lymphadenopathy is seen. The spleen, adrenal glands and right kidney appear normal. A low attenuating lesions in left kidney most consistent with cysts. The stomach, small and large bowel and appendix appear normal. There is no fluid collection. Mild aortoiliac atherosclerosis without aneurysm is noted. No lytic or sclerotic bony lesion is seen. There is scattered thoracic and lumbar spondylosis with mild degenerative change about the hips. IMPRESSION: Findings highly suspicious for a mass in the pancreatic head or at the sphincter of Oddi. MRCP with and without contrast is recommended for further evaluation. Critical Value/emergent results were called by telephone at the time of interpretation on 12/28/2014 at 3:45 pm to Dr. Conni Slipper, who verbally acknowledged these results. Electronically Signed   By: Inge Rise M.D.  On: 12/28/2014 15:48    EKG:   Orders placed or performed during the hospital encounter of 12/28/14  . ED EKG  . ED EKG    IMPRESSION AND PLAN:   1. 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 Candace Cruise Patient needs ERCP as recommended by radiology Pain management as needed Check CMP and ammonia levels in a.m.  2. Essential hypertension Resume home medications Cozaar and titrate as needed basis  3. Chronic  history of atrial fibrillation currently rate controlled Continued CARDIZEM  and holding Xarelto as patient needs ERCP  4. GERD Zantac and milk of magnesia   GI prophylaxis with Zantac and DVT prophylaxis with teds    All the records are reviewed and case discussed with ED provider. Management plans discussed with the patient, family and they are in agreement.  CODE STATUS: Full code, wife  TOTAL TIME TAKING CARE OF THIS PATIENT: 45 minutes.    Nicholes Mango M.D on 12/28/2014 at 5:05 PM  Between 7am to 6pm - Pager - [phone]  After 6pm go to [email or web address] - password [email or web address] [facility]  [facility]  Office  [phone]  CC: Primary care physician; Keith Rake, MD

## 2014-12-28 NOTE — ED Notes (Signed)
Nurse at bedside at this time.

## 2014-12-28 NOTE — ED Notes (Signed)
Patient transported to CT 

## 2014-12-28 NOTE — ED Provider Notes (Addendum)
[facility] Emergency Department Provider Note  ____________________________________________  Time seen: Approximately 1:45 PM  I have reviewed the triage vital signs and the nursing notes.   HISTORY  Chief Complaint Jaundice and Fatigue    HPI Mason Houston is a 64 y.o. male with history of paroxysmal atrial fibrillation arms are also, history of TIAs, hypertension and GERD who presents directly from his primary care doctor's office due to concern for painless jaundice which he first noted 2 days ago, gradual onset, constant since onset, currently moderate, no modifying factors. The patient reports that earlier this week he was diagnosed with his primary care doctor with a "virus". He had been feeling weak with some nonspecific symptoms. He denies any vomiting or diarrhea, no cough, sneezing, runny nose or congestion. No chest pain or difficulty breathing. He denies any abdominal pain recently but reports that sometime last month he had a week or so of periumbilical pain that some days that resolved. He has experienced a 13 pound unintentional weight loss over the past month.   Past Medical History  Diagnosis Date  . GERD (gastroesophageal reflux disease)   . Osteoarthrosis, unspecified whether generalized or localized, lower leg   . H/O Bell's palsy   . History of [place] measles   . Atrial fibrillation ([place])   . Hypertension   . Mini stroke ([place])   . Sixth cranial nerve palsy     Patient Active Problem List   Diagnosis Date Noted  . Arthritis 06/09/2014  . Stroke ([place]) 09/08/2013  . Obstructive sleep apnea 02/03/2013  . Depression 07/05/2012  . Hyperlipemia 12/23/2011  . Atrial fibrillation ([place])   . Hypertension     Past Surgical History  Procedure Laterality Date  . Cyst excision    . Trigger finger release      Current Outpatient Rx  Name  Route  Sig  Dispense  Refill  . B Complex-C (SUPER B COMPLEX PO)   Oral   Take by mouth daily.        Marland Kitchen diltiazem (TAZTIA XT) 360 MG 24 hr capsule      TAKE 1 CAPSULE BY MOUTH EVERY DAY   30 capsule   11   . loratadine (CLARITIN) 10 MG tablet   Oral   Take 10 mg by mouth daily.         Marland Kitchen losartan (COZAAR) 100 MG tablet      TAKE 1 TABLET BY MOUTH EVERY DAY   90 tablet   3   . magnesium oxide (MAG-OX) 400 MG tablet   Oral   Take 400 mg by mouth daily.         . Melatonin 5 MG TABS   Oral   Take by mouth as needed.          . methocarbamol (ROBAXIN) 750 MG tablet   Oral   Take 750 mg by mouth every 12 (twelve) hours.       3   . ranitidine (ZANTAC) 150 MG tablet   Oral   Take 150 mg by mouth at bedtime.         Alveda Reasons 20 MG TABS tablet      TAKE 1 TABLET BY MOUTH EVERY DAY WITH DINNER   30 tablet   6     Allergies Celebrex and Penicillins  Family History  Problem Relation Age of Onset  . Hypertension Mother     Social History Social History  Substance Use Topics  . Smoking  status: Former Smoker -- 4.50 packs/day for 15 years    Types: Cigarettes  . Smokeless tobacco: None  . Alcohol Use: No    Review of Systems Constitutional: No fever/chills Eyes: No visual changes. ENT: No sore throat. Cardiovascular: Denies chest pain. Respiratory: Denies shortness of breath. Gastrointestinal: No abdominal pain.  No nausea, no vomiting.  No diarrhea.  No constipation. Genitourinary: Negative for dysuria. Musculoskeletal: Negative for back pain. Skin: Negative for rash. Neurological: Negative for headaches, focal weakness or numbness.  10-point ROS otherwise negative.  ____________________________________________   PHYSICAL EXAM:  VITAL SIGNS: ED Triage Vitals  Enc Vitals Group     BP 12/28/14 1231 152/91 mmHg     Pulse Rate 12/28/14 1231 95     Resp 12/28/14 1231 18     Temp 12/28/14 1231 98.2 F (36.8 C)     Temp Source 12/28/14 1231 Oral     SpO2 12/28/14 1231 100 %     Weight 12/28/14 1231 244 lb (110.678 kg)     Height  12/28/14 1231 5\' 6"  (1.676 m)     Head Cir --      Peak Flow --      Pain Score --      Pain Loc --      Pain Edu? --      Excl. in [place]? --     Constitutional: Alert and oriented. Well appearing and in no acute distress. Eyes: + scleral icterus. PERRL. EOMI. Head: Atraumatic. Nose: No congestion/rhinnorhea. Mouth/Throat: Mucous membranes are moist.  Oropharynx non-erythematous. Neck: No stridor.  Cardiovascular: Normal rate, regular rhythm. Grossly normal heart sounds.  Good peripheral circulation. Respiratory: Normal respiratory effort.  No retractions. Lungs CTAB. Gastrointestinal: Soft and nontender. No distention. No abdominal bruits. No CVA tenderness. Genitourinary: deferred Musculoskeletal: No lower extremity tenderness nor edema.  No joint effusions. Neurologic:  Normal speech and language. No gross focal neurologic deficits are appreciated. No gait instability. Skin:  Skin is warm, dry and intact. No rash noted. +Jaundice of the head, neck and upper chest. Psychiatric: Mood and affect are normal. Speech and behavior are normal.  ____________________________________________   LABS (all labs ordered are listed, but only abnormal results are displayed)  Labs Reviewed  BASIC METABOLIC PANEL - Abnormal; Notable for the following:    Glucose, Bld 194 (*)    BUN 27 (*)    All other components within normal limits  CBC - Abnormal; Notable for the following:    RDW 17.0 (*)    All other components within normal limits  URINALYSIS COMPLETEWITH MICROSCOPIC ([facility] ONLY) - Abnormal; Notable for the following:    Color, Urine AMBER (*)    APPearance CLEAR (*)    Bilirubin Urine 2+ (*)    Squamous Epithelial / LPF 0-5 (*)    All other components within normal limits  AMMONIA - Abnormal; Notable for the following:    Ammonia 47 (*)    All other components within normal limits  HEPATIC FUNCTION PANEL - Abnormal; Notable for the following:    AST 128 (*)    ALT 278 (*)     Alkaline Phosphatase 327 (*)    Total Bilirubin 14.0 (*)    Bilirubin, Direct 8.9 (*)    Indirect Bilirubin 5.1 (*)    All other components within normal limits  PROTIME-INR - Abnormal; Notable for the following:    Prothrombin Time 18.1 (*)    All other components within normal limits  APTT - Abnormal; Notable  for the following:    aPTT 41 (*)    All other components within normal limits   ____________________________________________  EKG  none ____________________________________________  RADIOLOGY  CT abdomen and pelvis -pending ____________________________________________   PROCEDURES  Procedure(s) performed: None  Critical Care performed: No  ____________________________________________   INITIAL IMPRESSION / ASSESSMENT AND PLAN / ED COURSE  Pertinent labs & imaging results that were available during my care of the patient were reviewed by me and considered in my medical decision making (see chart for details).  Mason Houston is a 64 y.o. male with history of paroxysmal atrial fibrillation arms are also, history of TIAs, hypertension and GERD who presents directly from his primary care doctor's office due to concern for painless jaundice which he first noted 2 days ago. On exam, he is nontoxic appearing but does have scleral icterus as well as notable jaundice of the head, neck, upper chest. Labs reviewed. CBC is generally unremarkable. Ammonia is elevated at 47 however this is likely affected by icterus. T bilirubin elevated at 14, AST elevated 128, ALT elevated at 278, elevated alkaline phosphatase. Direct bilirubin is 8.9. INR 1.49. Discussed with him that my concern is for an obstructing lesion associated with his biliary tract. CT of the abdomen and pelvis is pending at this time. Care transferred to Dr. Cinda Quest at 3 PM pending results and final disposition. ____________________________________________   FINAL CLINICAL IMPRESSION(S) / ED DIAGNOSES  Final  diagnoses:  Jaundice      Joanne Gavel, MD 12/28/14 1500  Joanne Gavel, MD 12/28/14 1525

## 2014-12-28 NOTE — Plan of Care (Addendum)
Problem: Nutrition: Goal: Adequate nutrition will be maintained Outcome: Progressing Patient admitted from ED from PCP. A&O, wife at bedside. On tele. No c/o pain  NPO at midnight.

## 2014-12-29 ENCOUNTER — Inpatient Hospital Stay: Payer: 59

## 2014-12-29 ENCOUNTER — Inpatient Hospital Stay: Payer: 59 | Admitting: Anesthesiology

## 2014-12-29 ENCOUNTER — Encounter
Admission: EM | Disposition: A | Discharge status: Home / Self Care | Payer: Self-pay | Source: Home / Self Care | Attending: Specialist

## 2014-12-29 ENCOUNTER — Encounter: Payer: Self-pay | Admitting: Anesthesiology

## 2014-12-29 HISTORY — PX: ERCP: SHX5425

## 2014-12-29 LAB — COMPREHENSIVE METABOLIC PANEL
ALT: 252 U/L — ABNORMAL HIGH (ref 17–63)
AST: 124 U/L — ABNORMAL HIGH (ref 15–41)
Albumin: 3.5 g/dL (ref 3.5–5.0)
Alkaline Phosphatase: 319 U/L — ABNORMAL HIGH (ref 38–126)
Anion gap: 6 (ref 5–15)
BUN: 20 mg/dL (ref 6–20)
CO2: 23 mmol/L (ref 22–32)
Calcium: 9.1 mg/dL (ref 8.9–10.3)
Chloride: 108 mmol/L (ref 101–111)
Creatinine, Ser: 0.47 mg/dL — ABNORMAL LOW (ref 0.61–1.24)
GFR calc Af Amer: >60 mL/min (ref >60)
GFR calc non Af Amer: >60 mL/min (ref >60)
Glucose, Bld: 137 mg/dL — ABNORMAL HIGH (ref 65–99)
Potassium: 3.7 mmol/L (ref 3.5–5.1)
Sodium: 137 mmol/L (ref 135–145)
Total Bilirubin: 14.9 mg/dL — ABNORMAL HIGH (ref 0.3–1.2)
Total Protein: 7.7 g/dL (ref 6.5–8.1)

## 2014-12-29 LAB — CBC
HCT: 39 % — ABNORMAL LOW (ref 40.0–52.0)
Hemoglobin: 13.2 g/dL (ref 13.0–18.0)
MCH: 28.2 pg (ref 26.0–34.0)
MCHC: 33.8 g/dL (ref 32.0–36.0)
MCV: 83.5 fL (ref 80.0–100.0)
Platelets: 298 10*3/uL (ref 150–440)
RBC: 4.67 MIL/uL (ref 4.40–5.90)
RDW: 17.2 % — ABNORMAL HIGH (ref 11.5–14.5)
WBC: 5.6 10*3/uL (ref 3.8–10.6)

## 2014-12-29 LAB — PROTIME-INR
INR: 1.18
Prothrombin Time: 15.2 seconds — ABNORMAL HIGH (ref 11.4–15.0)

## 2014-12-29 SURGERY — ERCP, WITH INTERVENTION IF INDICATED
Anesthesia: General

## 2014-12-29 SURGERY — ERCP, WITH INTERVENTION IF INDICATED
Anesthesia: General | Laterality: Right

## 2014-12-29 MED ORDER — PROPOFOL 10 MG/ML IV BOLUS
INTRAVENOUS | Status: DC | PRN
  Administered 2014-12-29: 50 mg via INTRAVENOUS
  Administered 2014-12-29: 150 mg via INTRAVENOUS

## 2014-12-29 MED ORDER — INDOMETHACIN 50 MG RE SUPP
100.0000 mg | Freq: Once | RECTAL | Status: AC
  Administered 2014-12-29: 100 mg via RECTAL
  Filled 2014-12-29: qty 2

## 2014-12-29 MED ORDER — SUCCINYLCHOLINE CHLORIDE 20 MG/ML IJ SOLN
INTRAMUSCULAR | Status: DC | PRN
  Administered 2014-12-29: 100 mg via INTRAVENOUS

## 2014-12-29 MED ORDER — NEOSTIGMINE METHYLSULFATE 10 MG/10ML IV SOLN
INTRAVENOUS | Status: DC | PRN
  Administered 2014-12-29: 1 mg via INTRAVENOUS

## 2014-12-29 MED ORDER — ONDANSETRON HCL 4 MG/2ML IJ SOLN: 4.0000 mg | Freq: Once | INTRAMUSCULAR | Status: DC | PRN

## 2014-12-29 MED ORDER — GLYCOPYRROLATE 0.2 MG/ML IJ SOLN
INTRAMUSCULAR | Status: DC | PRN
  Administered 2014-12-29: .2 mg via INTRAVENOUS

## 2014-12-29 MED ORDER — MIDAZOLAM HCL 5 MG/5ML IJ SOLN
INTRAMUSCULAR | Status: DC | PRN
  Administered 2014-12-29: 2 mg via INTRAVENOUS

## 2014-12-29 MED ORDER — SODIUM CHLORIDE 0.9 % IV SOLN: INTRAVENOUS | Status: DC

## 2014-12-29 MED ORDER — FENTANYL CITRATE (PF) 100 MCG/2ML IJ SOLN: 25.0000 ug | INTRAMUSCULAR | Status: DC | PRN

## 2014-12-29 MED ORDER — FENTANYL CITRATE (PF) 100 MCG/2ML IJ SOLN
INTRAMUSCULAR | Status: DC | PRN
  Administered 2014-12-29 (×2): 50 ug via INTRAVENOUS

## 2014-12-29 MED ORDER — ROCURONIUM BROMIDE 100 MG/10ML IV SOLN
INTRAVENOUS | Status: DC | PRN
  Administered 2014-12-29: 5 mg via INTRAVENOUS

## 2014-12-29 MED ORDER — CIPROFLOXACIN IN D5W 400 MG/200ML IV SOLN
400.0000 mg | Freq: Once | INTRAVENOUS | Status: AC
  Administered 2014-12-29: 400 mg via INTRAVENOUS
  Filled 2014-12-29: qty 200

## 2014-12-29 MED ORDER — LIDOCAINE HCL (CARDIAC) 20 MG/ML IV SOLN
INTRAVENOUS | Status: DC | PRN
  Administered 2014-12-29: 60 mg via INTRAVENOUS

## 2014-12-29 MED ORDER — SODIUM CHLORIDE 0.9 % IV SOLN
INTRAVENOUS | Status: DC
Start: 2014-12-29 — End: 2014-12-29
  Administered 2014-12-29: 1000 mL via INTRAVENOUS

## 2014-12-29 MED ORDER — DILTIAZEM HCL ER COATED BEADS 180 MG PO CP24
360.0000 mg | ORAL_CAPSULE | Freq: Every day | ORAL | Status: DC
  Administered 2014-12-30: 360 mg via ORAL
  Filled 2014-12-29: qty 2

## 2014-12-29 MED ORDER — PHENYLEPHRINE HCL 10 MG/ML IJ SOLN
INTRAMUSCULAR | Status: DC | PRN
  Administered 2014-12-29 (×2): 100 ug via INTRAVENOUS

## 2014-12-29 NOTE — Progress Notes (Signed)
   12/29/14 0914  Clinical Encounter Type  Visited With Patient and family together  Visit Type Initial  Provided pastoral presence and support to patient and family member on unit.  [place] [phone]

## 2014-12-29 NOTE — Op Note (Signed)
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 Providers:         Lupita Dawn. Candace Cruise, MD Referring MD:      Otila Back Manuella Ghazi (Referring MD) Medicines:         General Anesthesia Complications:     No immediate complications. Procedure:         Pre-Anesthesia Assessment:                    - Prior to the procedure, a History and Physical was                     performed, and patient medications, allergies and                     sensitivities were reviewed. The patient's tolerance of                     previous anesthesia was reviewed.                    - The risks and benefits of the procedure and the sedation                     options and risks were discussed with the patient. All                     questions were answered and informed consent was obtained.                    - After reviewing the risks and benefits, the patient was                     deemed in satisfactory condition to undergo the procedure.                    After obtaining informed consent, the scope was passed                     under direct vision. Throughout the procedure, the                     patient's blood pressure, pulse, and oxygen saturations                     were monitored continuously. The ERCP was introduced                     through the mouth, and used to inject contrast into and                     used to inject contrast into the bile duct. The ERCP was                     accomplished without difficulty. The patient tolerated the  procedure well. Findings:      The scout film was normal. The esophagus was  successfully intubated       under direct vision. The scope was advanced to a normal major papilla in       the descending duodenum without detailed examination of the pharynx,       larynx and associated structures, and upper GI tract. The upper GI tract       was grossly normal. Prominent ampulla seen. Pancreatic duct was 1 st       cannulated. 5 Fr x 5 cm pancreatic stent was placed. The bile duct was       deeply cannulated with the short-nosed traction sphincterotome after       guidewire was used to cannulate CBD alongside the PD stent. Contrast was       injected. I personally interpreted the bile duct images. Ductal flow of       contrast was adequate. Image quality was adequate. Contrast extended to       the entire biliary tree. The lower third of the main bile duct contained       a single segmental stenosis. The middle third of the main bile duct and       upper third of the main bile duct were moderately dilated. A straight       Roadrunner wire was passed into the biliary tree. Biliary sphincterotomy       was made with a monofilament Autotome sphincterotome using ERBE       electrocautery. The sphincterotomy oozed blood. One 10 Fr by 5 cm       temporary stent with two external flaps and two internal flaps was       placed into the common bile duct. Bile flowed through the stent. The       stent was in good position. 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                    - The upper third of the main bile duct and middle third                     of the main bile duct were moderately dilated.                    - A sphincterotomy was performed.                    - One temporary stent was placed into the common bile duct. Recommendation:    - Avoid aspirin and nonsteroidal anti-inflammatory                     medicines today.                    - Observe patient's clinical course.                    - CA 19-9 level ordered. Will need EUS arranged  later. Procedure Code(s): --- Professional ---                    [phone], Endoscopic retrograde cholangiopancreatography                     (ERCP); with placement of endoscopic stent into biliary or  pancreatic duct, including pre- and post-dilation and                     guide wire passage, when performed, including                     sphincterotomy, when performed, each stent Diagnosis Code(s): --- Professional ---                    K83.1, Obstruction of bile duct                    D49.0, Neoplasm of unspecified behavior of digestive system                    R17, Unspecified jaundice                    K83.8, Other specified diseases of biliary tract CPT copyright 2014 American Medical Association. All rights reserved. The codes documented in this report are preliminary and upon coder review may  be revised to meet current compliance requirements. Hulen Luster, MD 12/29/2014 2:50:24 PM This report has been signed electronically. Number of Addenda: 0 Note Initiated On: 12/29/2014 1:44 PM      [facility]

## 2014-12-29 NOTE — Anesthesia Procedure Notes (Signed)
Procedure Name: Intubation Date/Time: 12/29/2014 2:00 PM Performed by: Dionne Bucy Pre-anesthesia Checklist: Patient identified, Patient being monitored, Timeout performed, Emergency Drugs available and Suction available Patient Re-evaluated:Patient Re-evaluated prior to inductionOxygen Delivery Method: Circle system utilized Preoxygenation: Pre-oxygenation with 100% oxygen Intubation Type: IV induction Ventilation: Mask ventilation without difficulty Laryngoscope Size: Mac and 3 Grade View: Grade II Tube type: Oral Tube size: 7.5 mm Number of attempts: 1 (DL x1 per Dr. Marcello Moores) Airway Equipment and Method: Stylet Placement Confirmation: ETT inserted through vocal cords under direct vision,  positive ETCO2 and breath sounds checked- equal and bilateral Secured at: 21 cm Tube secured with: Tape Dental Injury: Teeth and Oropharynx as per pre-operative assessment

## 2014-12-29 NOTE — Anesthesia Preprocedure Evaluation (Signed)
Anesthesia Evaluation  Patient identified by MRN, date of birth, ID band Patient awake    Reviewed: Allergy & Precautions, NPO status , Patient's Chart, lab work & pertinent test results, reviewed documented beta blocker date and time   Airway Mallampati: III  TM Distance: >3 FB     Dental  (+) Chipped   Pulmonary former smoker,           Cardiovascular hypertension, Pt. on medications      Neuro/Psych PSYCHIATRIC DISORDERS Depression  Neuromuscular disease    GI/Hepatic GERD  Medicated and Controlled,  Endo/Other    Renal/GU      Musculoskeletal  (+) Arthritis ,   Abdominal   Peds  Hematology   Anesthesia Other Findings   Reproductive/Obstetrics                             Anesthesia Physical Anesthesia Plan  ASA: III  Anesthesia Plan: General   Post-op Pain Management:    Induction: Intravenous  Airway Management Planned: Oral ETT  Additional Equipment:   Intra-op Plan:   Post-operative Plan:   Informed Consent: I have reviewed the patients History and Physical, chart, labs and discussed the procedure including the risks, benefits and alternatives for the proposed anesthesia with the patient or authorized representative who has indicated his/her understanding and acceptance.     Plan Discussed with: CRNA  Anesthesia Plan Comments:         Anesthesia Quick Evaluation

## 2014-12-29 NOTE — Progress Notes (Signed)
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 Presently denies any abdominal pain, nausea, vomiting. Patient's wife is at bedside.  REVIEW OF SYSTEMS:    Review of Systems  Constitutional: Negative for fever and chills.  HENT: Negative for congestion and tinnitus.   Eyes: Negative for blurred vision and double vision.  Respiratory: Negative for cough, shortness of breath and wheezing.   Cardiovascular: Negative for chest pain, orthopnea and PND.  Gastrointestinal: Negative for nausea, vomiting, abdominal pain and diarrhea.  Genitourinary: Negative for dysuria and hematuria.  Neurological: Negative for dizziness, sensory change and focal weakness.  All other systems reviewed and are negative.   Nutrition: Clear liquid Tolerating Diet: Yes Tolerating PT: Ambulatory   DRUG ALLERGIES:   Allergies  Allergen Reactions  . Celebrex [Celecoxib]   . Penicillins     Hives     VITALS:  Blood pressure 131/68, pulse 80, temperature 98.2 F (36.8 C), temperature source Oral, resp. rate 19, height 5\' 6"  (1.676 m), weight 110.36 kg (243 lb 4.8 oz), SpO2 97 %.  PHYSICAL EXAMINATION:   Physical Exam  GENERAL:  64 y.o.-year-old obese patient sitting up in chair in no acute distress.  EYES: Pupils equal, round, reactive to light and accommodation. + scleral icterus. Extraocular muscles intact.  HEENT: Head atraumatic, normocephalic. Oropharynx and nasopharynx clear.  NECK:  Supple, no jugular venous distention. No thyroid enlargement, no tenderness.  LUNGS: Normal breath sounds bilaterally, no wheezing, rales, rhonchi. No use of accessory muscles of respiration.  CARDIOVASCULAR: S1, S2 normal. No murmurs, rubs, or gallops.  ABDOMEN: Soft, nontender, nondistended. Bowel sounds  present. No organomegaly or mass.  EXTREMITIES: No cyanosis, clubbing or edema b/l.    NEUROLOGIC: Cranial nerves II through XII are intact. No focal Motor or sensory deficits b/l.   PSYCHIATRIC: The patient is alert and oriented x 3.  SKIN: No obvious rash, lesion, or ulcer.    LABORATORY PANEL:   CBC  Recent Labs Lab 12/29/14 0644  WBC 5.6  HGB 13.2  HCT 39.0*  PLT 298   ------------------------------------------------------------------------------------------------------------------  Chemistries   Recent Labs Lab 12/29/14 0644  NA 137  K 3.7  CL 108  CO2 23  GLUCOSE 137*  BUN 20  CREATININE 0.47*  CALCIUM 9.1  AST 124*  ALT 252*  ALKPHOS 319*  BILITOT 14.9*   ------------------------------------------------------------------------------------------------------------------  Cardiac Enzymes No results for input(s): TROPONINI in the last 168 hours. ------------------------------------------------------------------------------------------------------------------  RADIOLOGY:  Ct Abdomen Pelvis W Contrast  12/28/2014  CLINICAL DATA:  Weakness for 2 weeks. Painless jaundice for least 2 days. Initial encounter. EXAM: CT ABDOMEN AND PELVIS WITH CONTRAST TECHNIQUE: Multidetector CT imaging of the abdomen and pelvis was performed using the standard protocol following bolus administration of intravenous contrast. CONTRAST:  100 mL OMNIPAQUE IOHEXOL 300 MG/ML  SOLN COMPARISON:  None. FINDINGS: The lung bases are clear.  No pleural or pericardial effusion. The gallbladder is mildly distended and there is mild intrahepatic biliary ductal dilatation. The common bile duct is also somewhat prominent. The duct can be traced to the pancreatic head where it appears obliterated just proximal to the sphincter of Oddi. The pancreatic head appears somewhat full and the pancreatic duct is minimally prominent. No focal liver lesion is identified. No lymphadenopathy is seen. The spleen,  adrenal glands and right kidney appear normal. A low attenuating lesions in left kidney most consistent with cysts. The stomach, small and large bowel and appendix appear normal. There is no fluid collection. Mild aortoiliac atherosclerosis without aneurysm is noted. No lytic or sclerotic bony lesion is seen. There is scattered thoracic and lumbar spondylosis with mild degenerative change about the hips. IMPRESSION: Findings highly suspicious for a mass in the pancreatic head or at the sphincter of Oddi. MRCP with and without contrast is recommended for further evaluation. Critical Value/emergent results were called by telephone at the time of interpretation on 12/28/2014 at 3:45 pm to Dr. Conni Slipper, who verbally acknowledged these results. Electronically Signed   By: Inge Rise M.D.   On: 12/28/2014 15:48   Dg C-arm 1-60 Min-no Report  12/29/2014  CLINICAL DATA: Jaundice C-ARM 1-60 MINUTES Fluoroscopy was utilized by the requesting physician.  No radiographic interpretation.     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  #1 painless jaundice/abnormal LFTs-this is due to pancreatic head mass that was noted on the CT abdomen pelvis on admission. -Patient denies any acute abdominal pain nausea or vomiting presently. -Discussed with gastroenterology and patient underwent an ERCP today which showed underlying biliary stricture and patient underwent sphincterotomy and stent placement. -I will follow LFTs, lipase in the morning. - continue supportive care with IV fluids and clear liquid diet for now.  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 I will order a CA-19-9. -Patient likely would need a the breasts with biopsy. I will refer him to the [facility] upon  discharge.  #3 history of chronic atrial fibrillation-currently rate controlled. Continue Cardizem. -I will resume his Xarelto tomorrow if his LFTs are stable and he shows no evidence of bleeding.  #4 hypertension-continue losartan.  #5 osteoarthritis-continue Robaxin.    All the records are reviewed and case discussed with Care Management/Social Workerr. Management plans discussed with the patient, family and they are in agreement.  CODE STATUS: Full  DVT Prophylaxis: Ambulatory  TOTAL TIME TAKING CARE OF THIS PATIENT: 35 minutes.   POSSIBLE D/C IN 1-2 DAYS, DEPENDING ON CLINICAL CONDITION.   Henreitta Leber M.D on 12/29/2014 at 4:15 PM  Between 7am to 6pm - Pager - [phone]  After 6pm go to [email or web address] - password [email or web address]  [place] [facility]  Office  [phone]  CC: Primary care physician; Keith Rake, MD

## 2014-12-29 NOTE — Transfer of Care (Signed)
Immediate Anesthesia Transfer of Care Note  Patient: Mason Houston  Procedure(s) Performed: Procedure(s): ENDOSCOPIC RETROGRADE CHOLANGIOPANCREATOGRAPHY (ERCP) (N/A)  Patient Location: PACU  Anesthesia Type:General  Level of Consciousness: awake and patient cooperative  Airway & Oxygen Therapy: Patient Spontanous Breathing and Patient connected to face mask oxygen  Post-op Assessment: Report given to RN  Post vital signs: Reviewed and stable  Last Vitals:  Filed Vitals:   12/29/14 1309 12/29/14 1454  BP:  129/71  Pulse: 90 74  Temp: 37.6 C 37.4 C  Resp: 16 12    Complications: No apparent anesthesia complications

## 2014-12-29 NOTE — Anesthesia Postprocedure Evaluation (Signed)
Anesthesia Post Note  Patient: Mason Houston  Procedure(s) Performed: Procedure(s) (LRB): ENDOSCOPIC RETROGRADE CHOLANGIOPANCREATOGRAPHY (ERCP) (N/A)  Patient location during evaluation: Endoscopy Anesthesia Type: General Level of consciousness: awake Pain management: pain level controlled Vital Signs Assessment: post-procedure vital signs reviewed and stable Respiratory status: spontaneous breathing Cardiovascular status: blood pressure returned to baseline Anesthetic complications: no    Last Vitals:  Filed Vitals:   12/29/14 1539 12/29/14 1557  BP: 127/66 131/68  Pulse: 69 80  Temp: 36.8 C 36.8 C  Resp: 14 19    Last Pain:  Filed Vitals:   12/29/14 1557  PainSc: Asleep                 Iretta Mangrum S

## 2014-12-29 NOTE — Consult Note (Signed)
  Pt seen and examined. Please see Rushie Chestnut' notes. Pt with obstructive jaundice. LFT elevated. Possible pancreatic head mass. Last xarelto dose about 36 hrs ago. Discussed ERCP in detail with pt, esp increasing risk of bleeding with sphincterotomy and risk of pancreatitis. Pt agreed to proceed with ERCP. Prophylactic Abx as well as indocin ordered. Ca 19-9 ordered for tomorrow. Pt will likely need EUS scheduled as outpt later. Thanks.

## 2014-12-29 NOTE — Plan of Care (Signed)
Problem: Nutrition: Goal: Adequate nutrition will be maintained Outcome: Progressing Patient is NPO. Education provided. Awaiting GI consult.

## 2014-12-29 NOTE — Plan of Care (Signed)
Problem: Nutrition: Goal: Adequate nutrition will be maintained Outcome: Progressing Pt is currently NPO for possible MRCP today. Pt denies pain and has been resting comfortably this shift. No other signs of distress noted. Will continue to monitor.

## 2014-12-29 NOTE — Consult Note (Signed)
GI Inpatient Consult Note  Reason for Consult: Dr. Margaretmary Eddy   Attending Requesting Consult: Obstructive Jaundice  History of Present Illness: Mason Houston is a 64 y.o. male seen for evaluation of Dr. Margaretmary Eddy at the request of obstructive jaundice. He is a new patient to [facility]. PMHx of atrial fib (on Xarelto), HTN, hx of TIA, GERD.   He reports over the past 2 days developing painless jaundice. He has had weakness over the past month. Had a short course of upper abdominal pain in November but this resolved after a few days. He has an intentional weight loss of #35lbs this year-limiting daily caloric intake to 1200-1600 calories per day. He denies any nausea, change in appetite, vomiting. He does have chronic GERD symptoms, usually controlled on ranitidine 150mg  once a day, more recently needing ranitidine BID. No dyspaghia. No lower GI compliants-denies constipation, diarrhea, rectal bleeding, black stools. He does have dark urine over the past week. No family history of liver or pancreatic disease. No history of IV drug use, blood transfusions, tattoos, etc. Only new medication is Tylenol which he is using for arthritis due to not being able to take NSAIDs since on blood thinner,    Last [place] 4 [place]  Last Endoscopy: approximately 4 years ago, "small red spot" otherwise normal, [place] state.    Past Medical History:  Past Medical History  Diagnosis Date  . GERD (gastroesophageal reflux disease)   . Osteoarthrosis, unspecified whether generalized or localized, lower leg   . H/O Bell's palsy   . History of [place] measles   . Atrial fibrillation ([place])   . Hypertension   . Mini stroke ([place])   . Sixth cranial nerve palsy     Problem List: Patient Active Problem List   Diagnosis Date Noted  . Obstructive jaundice 12/28/2014  . Arthritis 06/09/2014  . Stroke ([place]) 09/08/2013  . Obstructive sleep apnea 02/03/2013  . Depression  07/05/2012  . Hyperlipemia 12/23/2011  . Atrial fibrillation ([place])   . Hypertension     Past Surgical History: Past Surgical History  Procedure Laterality Date  . Cyst excision    . Trigger finger release      Allergies: Allergies  Allergen Reactions  . Celebrex [Celecoxib]   . Penicillins     Hives     Home Medications: Prescriptions prior to admission  Medication Sig Dispense Refill Last Dose  . B Complex-C (SUPER B COMPLEX PO) Take by mouth daily.   12/27/2014 at Unknown time  . diltiazem (TAZTIA XT) 360 MG 24 hr capsule TAKE 1 CAPSULE BY MOUTH EVERY DAY 30 capsule 11 12/28/2014 at 0800  . loratadine (CLARITIN) 10 MG tablet Take 10 mg by mouth daily.   12/27/2014 at Unknown time  . losartan (COZAAR) 100 MG tablet TAKE 1 TABLET BY MOUTH EVERY DAY 90 tablet 3 12/27/2014 at Unknown time  . magnesium oxide (MAG-OX) 400 MG tablet Take 400 mg by mouth daily.   12/27/2014 at Unknown time  . Melatonin 5 MG TABS Take by mouth as needed.    prn at prn  . methocarbamol (ROBAXIN) 750 MG tablet Take 750 mg by mouth every 12 (twelve) hours.   3 prn at prn  . ranitidine (ZANTAC) 150 MG tablet Take 150 mg by mouth at bedtime.   12/27/2014 at Unknown time  . XARELTO 20 MG TABS tablet TAKE 1 TABLET BY MOUTH EVERY DAY WITH DINNER 30 tablet 6 12/27/2014 at 2100  Home medication reconciliation was completed with the patient.   Scheduled Inpatient Medications:   . diltiazem  360 mg Oral Daily  . famotidine  20 mg Oral QHS  . loratadine  10 mg Oral Daily  . losartan  100 mg Oral Daily  . magnesium oxide  400 mg Oral Daily  . sodium chloride  3 mL Intravenous Q12H    Continuous Inpatient Infusions:     PRN Inpatient Medications:  sodium chloride, magnesium hydroxide, methocarbamol, ondansetron **OR** ondansetron (ZOFRAN) IV, sodium chloride  Family History: family history includes Hypertension in his mother.  Denies family history of CRC, colon polyps, liver/pancreatic disease.     Social History:  Rare social alcohol use, last 5 years ago. Was heavy smoker (2-3 PPD) x years, stopped in 1990s.   Review of Systems: Constitutional: + intentional weight loss. + fatigue.  Eyes: No changes in vision. ENT: No oral lesions, sore throat.  GI: see HPI.  Heme/Lymph: No easy bruising.  CV: No chest pain.  GU: No hematuria.  Integumentary: No rashes.  Neuro: No headaches.  Psych: No depression/anxiety.  Endocrine: No heat/cold intolerance.  Allergic/Immunologic: No urticaria.  Resp: No cough, SOB.  Musculoskeletal: No joint swelling.    Physical Examination: BP 109/68 mmHg  Pulse 74  Temp(Src) 98.3 F (36.8 C) (Oral)  Resp 18  Ht 5\' 6"  (1.676 m)  Wt 243 lb 4.8 oz (110.36 kg)  BMI 39.29 kg/m2  SpO2 98% Gen: NAD, alert and oriented x 4, jaundiced  HEENT: PEERLA, EOMI. + scleral icterus.  Neck: supple, no JVD or thyromegaly Chest: CTA bilaterally, no wheezes, crackles, or other adventitious sounds CV: RRR, no m/g/c/r Abd: soft, NT, ND, +BS in all four quadrants; no HSM, guarding, ridigity, or rebound tenderness Ext: no edema, well perfused with 2+ pulses, Skin: no rash or lesions noted Lymph: no LAD observed   Data: Lab Results  Component Value Date   WBC 5.6 12/29/2014   HGB 13.2 12/29/2014   HCT 39.0* 12/29/2014   MCV 83.5 12/29/2014   PLT 298 12/29/2014    Recent Labs Lab 12/28/14 1233 12/29/14 0644  HGB 13.5 13.2   Lab Results  Component Value Date   NA 137 12/29/2014   K 3.7 12/29/2014   CL 108 12/29/2014   CO2 23 12/29/2014   BUN 20 12/29/2014   CREATININE 0.47* 12/29/2014   Lab Results  Component Value Date   ALT 252* 12/29/2014   AST 124* 12/29/2014   ALKPHOS 319* 12/29/2014   BILITOT 14.9* 12/29/2014    Recent Labs Lab 12/28/14 1233 12/29/14 0644  APTT 41*  --   INR 1.49 1.18   Assessment/Plan: [name] is a 64 y.o. male admitted for jaundice.   FINDINGS: The lung bases are clear. No pleural or  pericardial effusion.  The gallbladder is mildly distended and there is mild intrahepatic biliary ductal dilatation. The common bile duct is also somewhat prominent. The duct can be traced to the pancreatic head where it appears obliterated just proximal to the sphincter of Oddi. The pancreatic head appears somewhat full and the pancreatic duct is minimally prominent. No focal liver lesion is identified. No lymphadenopathy is seen. The spleen, adrenal glands and right kidney appear normal. A low attenuating lesions in left kidney most consistent with cysts.  The stomach, small and large bowel and appendix appear normal. There is no fluid collection. Mild aortoiliac atherosclerosis without aneurysm is noted.  No lytic or sclerotic bony lesion is seen. There is  scattered thoracic and lumbar spondylosis with mild degenerative change about the hips.  IMPRESSION: Findings highly suspicious for a mass in the pancreatic head or at the sphincter of Oddi. MRCP with and without contrast is recommended for further evaluation.  Recommendations:  1. Painless jaundice - with above imaging concerning for pancreatic malignancy. Needs ERCP w/ sphincterotomy. He is higher risk for ERCP given his recent Xarelto (last dose 12/21 at 9pm) and this was discussed with him and family member. Also discussed risk of post ERCP pancreatitis. Pt consents to proceed. He is NPO. Pre op Cipro given for ABX prophylaxis due to PCN allergy. Will also given rectal indomethacin to decrease risk of pancreatitis.   Case discussed w/ Dr. Candace Cruise. Thank you for the consult. Please call with questions or concerns.  Ronney Asters, PA-C [place]

## 2014-12-30 DIAGNOSIS — Z87820 Personal history of traumatic brain injury: Secondary | ICD-10-CM

## 2014-12-30 DIAGNOSIS — I4891 Unspecified atrial fibrillation: Secondary | ICD-10-CM

## 2014-12-30 DIAGNOSIS — K831 Obstruction of bile duct: Principal | ICD-10-CM

## 2014-12-30 DIAGNOSIS — K219 Gastro-esophageal reflux disease without esophagitis: Secondary | ICD-10-CM

## 2014-12-30 DIAGNOSIS — I10 Essential (primary) hypertension: Secondary | ICD-10-CM

## 2014-12-30 DIAGNOSIS — D869 Sarcoidosis, unspecified: Secondary | ICD-10-CM

## 2014-12-30 LAB — CA 19-9 (SERIAL): CA 19-9: 31 U/mL (ref 0–35)

## 2014-12-30 LAB — CBC
HCT: 38.5 % — ABNORMAL LOW (ref 40.0–52.0)
Hemoglobin: 13.1 g/dL (ref 13.0–18.0)
MCH: 29 pg (ref 26.0–34.0)
MCHC: 34.1 g/dL (ref 32.0–36.0)
MCV: 85.1 fL (ref 80.0–100.0)
Platelets: 292 10*3/uL (ref 150–440)
RBC: 4.52 MIL/uL (ref 4.40–5.90)
RDW: 17.3 % — ABNORMAL HIGH (ref 11.5–14.5)
WBC: 6.3 10*3/uL (ref 3.8–10.6)

## 2014-12-30 LAB — COMPREHENSIVE METABOLIC PANEL
ALT: 245 U/L — ABNORMAL HIGH (ref 17–63)
AST: 128 U/L — ABNORMAL HIGH (ref 15–41)
Albumin: 3.5 g/dL (ref 3.5–5.0)
Alkaline Phosphatase: 323 U/L — ABNORMAL HIGH (ref 38–126)
Anion gap: 9 (ref 5–15)
BUN: 24 mg/dL — ABNORMAL HIGH (ref 6–20)
CO2: 25 mmol/L (ref 22–32)
Calcium: 8.8 mg/dL — ABNORMAL LOW (ref 8.9–10.3)
Chloride: 104 mmol/L (ref 101–111)
Creatinine, Ser: 0.82 mg/dL (ref 0.61–1.24)
GFR calc Af Amer: >60 mL/min (ref >60)
GFR calc non Af Amer: >60 mL/min (ref >60)
Glucose, Bld: 149 mg/dL — ABNORMAL HIGH (ref 65–99)
Potassium: 4 mmol/L (ref 3.5–5.1)
Sodium: 138 mmol/L (ref 135–145)
Total Bilirubin: 9.3 mg/dL — ABNORMAL HIGH (ref 0.3–1.2)
Total Protein: 7.4 g/dL (ref 6.5–8.1)

## 2014-12-30 LAB — LIPASE, BLOOD: Lipase: 28 U/L (ref 11–51)

## 2014-12-30 MED ORDER — LOSARTAN POTASSIUM 50 MG PO TABS: 100.0000 mg | ORAL_TABLET | Freq: Every day | ORAL | Status: DC

## 2014-12-30 NOTE — Consult Note (Addendum)
[place] @ [facility] [phone]  Fax:([phone]) [place] OB: 25-Apr-1950  MR#: [id number]  NLG#:[id number]  Patient Care Team: Roselee Nova, MD as PCP - General (Internal Medicine) Requesting provider Dr. Lockie Mola CHIEF COMPLAINT:  Chief Complaint  Patient presents with  . Jaundice  . Fatigue     No history exists.    No flowsheet data found.  HISTORY OF PRESENT ILLNESS:   [name] is 64 year old gentleman with a history of mini strokes, atrial fibrillation, who developed painless jaundice over the past few weeks. He was admitted to the hospital, and underwent evaluation, which revealed fullness of the and creatinine had with a suspicious mass, and associated biliary dilatation. He underwent ERCP on 12/29/2014 with placement of 2 stents. He tolerated the procedure well, has not had fever, chills, nausea, vomiting, diarrhea, constipation. Previously noted dark urine has lightened up somewhat, but he continues to have light-colored stools.  REVIEW OF SYSTEMS:   Review of Systems  All other systems reviewed and are negative.    PAST MEDICAL HISTORY: Past Medical History  Diagnosis Date  . GERD (gastroesophageal reflux disease)   . Osteoarthrosis, unspecified whether generalized or localized, lower leg   . H/O Bell's palsy   . History of [place] measles   . Atrial fibrillation ([place])   . Hypertension   . Mini stroke ([place])   . Sixth cranial nerve palsy     PAST SURGICAL HISTORY: Past Surgical History  Procedure Laterality Date  . Cyst excision    . Trigger finger release      FAMILY HISTORY Family History  Problem Relation Age of Onset  . Hypertension Mother     ADVANCED DIRECTIVES:  No flowsheet data found.  HEALTH MAINTENANCE: Social History  Substance Use Topics  . Smoking status: Former Smoker -- 4.50 packs/day for 15 years    Types: Cigarettes  . Smokeless tobacco: None  . Alcohol Use: No     Allergies  Allergen  Reactions  . Celebrex [Celecoxib]   . Penicillins     Hives     Current Facility-Administered Medications  Medication Dose Route Frequency Provider Last Rate Last Dose  . 0.9 %  sodium chloride infusion  250 mL Intravenous PRN Nicholes Mango, MD      . diltiazem (CARDIZEM CD) 24 hr capsule 360 mg  360 mg Oral Daily Nicholes Mango, MD   360 mg at 12/30/14 0752  . famotidine (PEPCID) tablet 20 mg  20 mg Oral QHS Nicholes Mango, MD   20 mg at 12/29/14 2005  . loratadine (CLARITIN) tablet 10 mg  10 mg Oral Daily Nicholes Mango, MD   10 mg at 12/30/14 0752  . losartan (COZAAR) tablet 100 mg  100 mg Oral QHS Henreitta Leber, MD      . magnesium hydroxide (MILK OF MAGNESIA) suspension 30 mL  30 mL Oral Daily PRN Nicholes Mango, MD      . magnesium oxide (MAG-OX) tablet 400 mg  400 mg Oral Daily Nicholes Mango, MD   400 mg at 12/30/14 0752  . methocarbamol (ROBAXIN) tablet 750 mg  750 mg Oral Q12H PRN Nicholes Mango, MD      . ondansetron (ZOFRAN) tablet 4 mg  4 mg Oral Q6H PRN Nicholes Mango, MD       Or  . ondansetron (ZOFRAN) injection 4 mg  4 mg Intravenous Q6H PRN Aruna Gouru, MD      . sodium chloride 0.9 % injection  3 mL  3 mL Intravenous Q12H Nicholes Mango, MD   3 mL at 12/30/14 0752  . sodium chloride 0.9 % injection 3 mL  3 mL Intravenous PRN Nicholes Mango, MD        OBJECTIVE:  Filed Vitals:   12/29/14 2156 12/30/14 0522  BP: 103/67 114/80  Pulse: 80 76  Temp: 98.3 F (36.8 C) 98.1 F (36.7 C)  Resp: 18 18     Body mass index is 39.29 kg/(m^2).    ECOG FS:0 - Asymptomatic  Physical Exam  Constitutional: He is oriented to person, place, and time and well-developed, well-nourished, and in no distress. No distress.  Caucasian male in no significant distress; visible jaundice  HENT:  Head: Normocephalic and atraumatic.  Right Ear: External ear normal.  Left Ear: External ear normal.  Nose: Nose normal.  Mouth/Throat: Oropharynx is clear and moist. No oropharyngeal exudate.  Eyes: Conjunctivae and EOM  are normal. Pupils are equal, round, and reactive to light. Right eye exhibits no discharge. Left eye exhibits no discharge. No scleral icterus.  Neck: Normal range of motion. Neck supple. No JVD present. No tracheal deviation present. No thyromegaly present.  Cardiovascular: Normal rate, regular rhythm, normal heart sounds and intact distal pulses.  Exam reveals no gallop and no friction rub.   No murmur heard. Pulmonary/Chest: Effort normal and breath sounds normal. No stridor. No respiratory distress. He has no wheezes. He has no rales. He exhibits no tenderness.  Abdominal: Soft. Bowel sounds are normal. He exhibits no distension and no mass. There is no tenderness. There is no rebound and no guarding.  Genitourinary:  Patient deferred  Musculoskeletal: Normal range of motion. He exhibits no edema or tenderness.  Lymphadenopathy:    He has no cervical adenopathy.  Neurological: He is alert and oriented to person, place, and time. He has normal reflexes. No cranial nerve deficit. He exhibits normal muscle tone. Gait normal. Coordination normal. GCS score is 15.  Skin: Skin is warm and dry. No rash noted. He is not diaphoretic. No erythema. No pallor.  Jaundice  Psychiatric: Mood, memory, affect and judgment normal.  Nursing note and vitals reviewed.    LAB RESULTS:  Results for orders placed or performed during the hospital encounter of 12/28/14 (from the past 72 hour(s))  Basic metabolic panel     Status: Abnormal   Collection Time: 12/28/14 12:33 PM  Result Value Ref Range   Sodium 135 135 - 145 mmol/L   Potassium 3.8 3.5 - 5.1 mmol/L   Chloride 106 101 - 111 mmol/L   CO2 22 22 - 32 mmol/L   Glucose, Bld 194 (H) 65 - 99 mg/dL   BUN 27 (H) 6 - 20 mg/dL   Creatinine, Ser 0.75 0.61 - 1.24 mg/dL   Calcium 9.3 8.9 - 10.3 mg/dL   GFR calc non Af Amer >60 >60 mL/min   GFR calc Af Amer >60 >60 mL/min    Comment: (NOTE) The eGFR has been calculated using the CKD EPI equation. This  calculation has not been validated in all clinical situations. eGFR's persistently <60 mL/min signify possible Chronic Kidney Disease.    Anion gap 7 5 - 15  CBC     Status: Abnormal   Collection Time: 12/28/14 12:33 PM  Result Value Ref Range   WBC 7.6 3.8 - 10.6 K/uL   RBC 4.85 4.40 - 5.90 MIL/uL   Hemoglobin 13.5 13.0 - 18.0 g/dL   HCT 41.1 40.0 - 52.0 %  MCV 84.7 80.0 - 100.0 fL   MCH 27.9 26.0 - 34.0 pg   MCHC 32.9 32.0 - 36.0 g/dL   RDW 17.0 (H) 11.5 - 14.5 %   Platelets 290 150 - 440 K/uL  Ammonia     Status: Abnormal   Collection Time: 12/28/14 12:33 PM  Result Value Ref Range   Ammonia 47 (H) 9 - 35 umol/L    Comment: ICTERUS AT THIS LEVEL MAY AFFECT RESULT  Hepatic function panel     Status: Abnormal   Collection Time: 12/28/14 12:33 PM  Result Value Ref Range   Total Protein 8.1 6.5 - 8.1 g/dL   Albumin 3.8 3.5 - 5.0 g/dL   AST 128 (H) 15 - 41 U/L   ALT 278 (H) 17 - 63 U/L   Alkaline Phosphatase 327 (H) 38 - 126 U/L   Total Bilirubin 14.0 (H) 0.3 - 1.2 mg/dL   Bilirubin, Direct 8.9 (H) 0.1 - 0.5 mg/dL   Indirect Bilirubin 5.1 (H) 0.3 - 0.9 mg/dL  Protime-INR     Status: Abnormal   Collection Time: 12/28/14 12:33 PM  Result Value Ref Range   Prothrombin Time 18.1 (H) 11.4 - 15.0 seconds   INR 1.49   APTT     Status: Abnormal   Collection Time: 12/28/14 12:33 PM  Result Value Ref Range   aPTT 41 (H) 24 - 36 seconds    Comment:        IF BASELINE aPTT IS ELEVATED, SUGGEST PATIENT RISK ASSESSMENT BE USED TO DETERMINE APPROPRIATE ANTICOAGULANT THERAPY.   Urinalysis complete, with microscopic ([facility] only)     Status: Abnormal   Collection Time: 12/28/14 12:38 PM  Result Value Ref Range   Color, Urine AMBER (A) YELLOW   APPearance CLEAR (A) CLEAR   Glucose, UA NEGATIVE NEGATIVE mg/dL   Bilirubin Urine 2+ (A) NEGATIVE   Ketones, ur NEGATIVE NEGATIVE mg/dL   Specific Gravity, Urine 1.021 1.005 - 1.030   Hgb urine dipstick NEGATIVE NEGATIVE   pH 5.0 5.0 -  8.0   Protein, ur NEGATIVE NEGATIVE mg/dL   Nitrite NEGATIVE NEGATIVE   Leukocytes, UA NEGATIVE NEGATIVE   RBC / HPF 0-5 0 - 5 RBC/hpf   WBC, UA 0-5 0 - 5 WBC/hpf   Bacteria, UA NONE SEEN NONE SEEN   Squamous Epithelial / LPF 0-5 (A) NONE SEEN   Mucous PRESENT   Comprehensive metabolic panel     Status: Abnormal   Collection Time: 12/29/14  6:44 AM  Result Value Ref Range   Sodium 137 135 - 145 mmol/L   Potassium 3.7 3.5 - 5.1 mmol/L   Chloride 108 101 - 111 mmol/L   CO2 23 22 - 32 mmol/L   Glucose, Bld 137 (H) 65 - 99 mg/dL   BUN 20 6 - 20 mg/dL   Creatinine, Ser 0.47 (L) 0.61 - 1.24 mg/dL    Comment: ICTERUS AT THIS LEVEL MAY AFFECT RESULT   Calcium 9.1 8.9 - 10.3 mg/dL   Total Protein 7.7 6.5 - 8.1 g/dL   Albumin 3.5 3.5 - 5.0 g/dL   AST 124 (H) 15 - 41 U/L   ALT 252 (H) 17 - 63 U/L   Alkaline Phosphatase 319 (H) 38 - 126 U/L   Total Bilirubin 14.9 (H) 0.3 - 1.2 mg/dL   GFR calc non Af Amer >60 >60 mL/min   GFR calc Af Amer >60 >60 mL/min    Comment: (NOTE) The eGFR has been calculated using the CKD  EPI equation. This calculation has not been validated in all clinical situations. eGFR's persistently <60 mL/min signify possible Chronic Kidney Disease.    Anion gap 6 5 - 15  CBC     Status: Abnormal   Collection Time: 12/29/14  6:44 AM  Result Value Ref Range   WBC 5.6 3.8 - 10.6 K/uL   RBC 4.67 4.40 - 5.90 MIL/uL   Hemoglobin 13.2 13.0 - 18.0 g/dL   HCT 39.0 (L) 40.0 - 52.0 %   MCV 83.5 80.0 - 100.0 fL   MCH 28.2 26.0 - 34.0 pg   MCHC 33.8 32.0 - 36.0 g/dL   RDW 17.2 (H) 11.5 - 14.5 %   Platelets 298 150 - 440 K/uL  Protime-INR     Status: Abnormal   Collection Time: 12/29/14  6:44 AM  Result Value Ref Range   Prothrombin Time 15.2 (H) 11.4 - 15.0 seconds   INR 1.18   Lipase, blood     Status: None   Collection Time: 12/30/14  4:13 AM  Result Value Ref Range   Lipase 28 11 - 51 U/L  Comprehensive metabolic panel     Status: Abnormal   Collection Time:  12/30/14  4:13 AM  Result Value Ref Range   Sodium 138 135 - 145 mmol/L   Potassium 4.0 3.5 - 5.1 mmol/L   Chloride 104 101 - 111 mmol/L   CO2 25 22 - 32 mmol/L   Glucose, Bld 149 (H) 65 - 99 mg/dL   BUN 24 (H) 6 - 20 mg/dL   Creatinine, Ser 0.82 0.61 - 1.24 mg/dL   Calcium 8.8 (L) 8.9 - 10.3 mg/dL   Total Protein 7.4 6.5 - 8.1 g/dL   Albumin 3.5 3.5 - 5.0 g/dL   AST 128 (H) 15 - 41 U/L   ALT 245 (H) 17 - 63 U/L   Alkaline Phosphatase 323 (H) 38 - 126 U/L   Total Bilirubin 9.3 (H) 0.3 - 1.2 mg/dL   GFR calc non Af Amer >60 >60 mL/min   GFR calc Af Amer >60 >60 mL/min    Comment: (NOTE) The eGFR has been calculated using the CKD EPI equation. This calculation has not been validated in all clinical situations. eGFR's persistently <60 mL/min signify possible Chronic Kidney Disease.    Anion gap 9 5 - 15  CBC     Status: Abnormal   Collection Time: 12/30/14  4:13 AM  Result Value Ref Range   WBC 6.3 3.8 - 10.6 K/uL   RBC 4.52 4.40 - 5.90 MIL/uL   Hemoglobin 13.1 13.0 - 18.0 g/dL   HCT 38.5 (L) 40.0 - 52.0 %   MCV 85.1 80.0 - 100.0 fL   MCH 29.0 26.0 - 34.0 pg   MCHC 34.1 32.0 - 36.0 g/dL   RDW 17.3 (H) 11.5 - 14.5 %   Platelets 292 150 - 440 K/uL       STUDIES: Ct Abdomen Pelvis W Contrast  12/28/2014  CLINICAL DATA:  Weakness for 2 weeks. Painless jaundice for least 2 days. Initial encounter. EXAM: CT ABDOMEN AND PELVIS WITH CONTRAST TECHNIQUE: Multidetector CT imaging of the abdomen and pelvis was performed using the standard protocol following bolus administration of intravenous contrast. CONTRAST:  100 mL OMNIPAQUE IOHEXOL 300 MG/ML  SOLN COMPARISON:  None. FINDINGS: The lung bases are clear.  No pleural or pericardial effusion. The gallbladder is mildly distended and there is mild intrahepatic biliary ductal dilatation. The common bile duct is also somewhat prominent.  The duct can be traced to the pancreatic head where it appears obliterated just proximal to the  sphincter of Oddi. The pancreatic head appears somewhat full and the pancreatic duct is minimally prominent. No focal liver lesion is identified. No lymphadenopathy is seen. The spleen, adrenal glands and right kidney appear normal. A low attenuating lesions in left kidney most consistent with cysts. The stomach, small and large bowel and appendix appear normal. There is no fluid collection. Mild aortoiliac atherosclerosis without aneurysm is noted. No lytic or sclerotic bony lesion is seen. There is scattered thoracic and lumbar spondylosis with mild degenerative change about the hips. IMPRESSION: Findings highly suspicious for a mass in the pancreatic head or at the sphincter of Oddi. MRCP with and without contrast is recommended for further evaluation. Critical Value/emergent results were called by telephone at the time of interpretation on 12/28/2014 at 3:45 pm to Dr. Conni Slipper, who verbally acknowledged these results. Electronically Signed   By: Inge Rise M.D.   On: 12/28/2014 15:48   Dg C-arm 1-60 Min-no Report  12/29/2014  CLINICAL DATA: Jaundice C-ARM 1-60 MINUTES Fluoroscopy was utilized by the requesting physician.  No radiographic interpretation.    ASSESSMENT and MEDICAL DECISION MAKING:  Suspected pancreatic head mass with associated biliary obstruction- certainly, the most pressing question is whether patient has pancreatic adenocarcinoma. We will need to arrange diagnostic endoscopic ultrasound to obtain tissue biopsy, and then plan future treatment. At least as of now it appears that there are no other concerning findings on the CT of the abdomen and pelvis such as lymphadenopathy, metastatic lesions in the liver, and involvement of major vessels with the suspected tumor. Patient has been able to tolerate ERCP well, with significant improvement of bilirubin. We will discuss his case with our gastroenterology colleagues, in order to arrange endoscopic ultrasound early next week, and  then based on the results, likely, refer to our surgical colleagues, since the only curative treatment for pancreatic cancer is surgical resection. Depending on the results of surgical resection and surgical staging chemotherapy can be administered in adjuvant setting. If the patient does not appear to be a good candidate for an extensive surgery (patient had history of several mini strokes, morbid obesity, atrial fibrillation), then finicky of chemoradiation should be entertained. If preoperative assessment indicates borderline resectable tumor, then neoadjuvant chemotherapy will be entertained. Patient is on xarelto time for antral fibrillation. Depending on the timing of the endoscopic ultrasound biopsy, he will have to stop xarelto for 48 hours prior to the procedure. However, in the meantime, he will continue with several  We will communicate with the patient regarding the plan of action early next week.    Patient expressed understanding and was in agreement with this plan. He also understands that He can call clinic at any time with any questions, concerns, or complaints.  Case was discussed with Dr. Lockie Mola  No matching staging information was found for the patient.  Roxana Hires, MD   12/30/2014 9:55 AM

## 2014-12-30 NOTE — Discharge Summary (Signed)
[facility] NAME: Mason Houston    MR#:  [id number]  DATE OF BIRTH:  Jan 12, 1950  DATE OF ADMISSION:  12/28/2014 ADMITTING PHYSICIAN: Nicholes Mango, MD  DATE OF DISCHARGE: 12/30/2014 12:55 PM  PRIMARY CARE PHYSICIAN: Keith Rake, MD    ADMISSION DIAGNOSIS:  Abdominal mass [R19.00] Jaundice [R17]  DISCHARGE DIAGNOSIS:  Active Problems:   Obstructive jaundice   SECONDARY DIAGNOSIS:   Past Medical History  Diagnosis Date  . GERD (gastroesophageal reflux disease)   . Osteoarthrosis, unspecified whether generalized or localized, lower leg   . H/O Bell's palsy   . History of [place] measles   . Atrial fibrillation ([place])   . Hypertension   . Mini stroke ([place])   . Sixth cranial nerve palsy     HOSPITAL COURSE:   64 year old male with past medical history of hypertension, osteoarthritis, atrial fibrillation, history of previous CVA, history of Bell's palsy who presented to the hospital due to painless jaundice and noted to have a pancreatic head mass.  #1 painless jaundice/abnormal LFTs-this is due to pancreatic head mass that was noted on the CT abdomen pelvis on admission. -Patient denied any acute abdominal pain nausea or vomiting while in the hospital - seen by Gastroenterology and underwent ERCP which showed biliary stricture and patient underwent sphincterotomy and stent placement. -Post ERCP patient's LFTs have improved. His bilirubins have come down. His lipase is normal without any evidence of pancreatitis. He has no abdominal pain and was tolerating a regular diet well and therefore being discharged home with follow-up with gastroenterology.  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 CA-19-9 is still pending. -Patient was seen by oncology and they will see her him as an outpatient to set up endoscopic ultrasound and biopsy and further management of his pancreatic mass which is suspected to be  malignant.  #3 history of chronic atrial fibrillation-currently rate controlled. He will Continue Cardizem. -He will resume his Xarelto now and this will need to be stopped 48 hrs prior to his EUS or biopsy done as outpatient and he is aware of that.    #4 hypertension- he will continue losartan.  #5 osteoarthritis- he will continue Robaxin  DISCHARGE CONDITIONS:   Stable  CONSULTS OBTAINED:  Treatment Team:  Hulen Luster, MD [name], MD  DRUG ALLERGIES:   Allergies  Allergen Reactions  . Celebrex [Celecoxib]   . Penicillins     Hives     DISCHARGE MEDICATIONS:   Discharge Medication List as of 12/30/2014  9:47 AM    CONTINUE these medications which have NOT CHANGED   Details  B Complex-C (SUPER B COMPLEX PO) Take by mouth daily., Until Discontinued, Historical Med    diltiazem (TAZTIA XT) 360 MG 24 hr capsule TAKE 1 CAPSULE BY MOUTH EVERY DAY, Normal    loratadine (CLARITIN) 10 MG tablet Take 10 mg by mouth daily., Until Discontinued, Historical Med    losartan (COZAAR) 100 MG tablet TAKE 1 TABLET BY MOUTH EVERY DAY, Normal    magnesium oxide (MAG-OX) 400 MG tablet Take 400 mg by mouth daily., Until Discontinued, Historical Med    Melatonin 5 MG TABS Take by mouth as needed. , Until Discontinued, Historical Med    methocarbamol (ROBAXIN) 750 MG tablet Take 750 mg by mouth every 12 (twelve) hours. , Starting 09/19/2014, Until Discontinued, Historical Med    ranitidine (ZANTAC) 150 MG tablet Take 150 mg by mouth at bedtime., Until Discontinued, Historical Med  XARELTO 20 MG TABS tablet TAKE 1 TABLET BY MOUTH EVERY DAY WITH DINNER, Normal         DISCHARGE INSTRUCTIONS:   DIET:  Cardiac diet  DISCHARGE CONDITION:  Stable  ACTIVITY:  Activity as tolerated  OXYGEN:  Home Oxygen: No.   Oxygen Delivery: room air  DISCHARGE LOCATION:  home   If you experience worsening of your admission symptoms, develop shortness of breath, life threatening  emergency, suicidal or homicidal thoughts you must seek medical attention immediately by calling 911 or calling your MD immediately  if symptoms less severe.  You Must read complete instructions/literature along with all the possible adverse reactions/side effects for all the Medicines you take and that have been prescribed to you. Take any new Medicines after you have completely understood and accpet all the possible adverse reactions/side effects.   Please note  You were cared for by a hospitalist during your hospital stay. If you have any questions about your discharge medications or the care you received while you were in the hospital after you are discharged, you can call the unit and asked to speak with the hospitalist on call if the hospitalist that took care of you is not available. Once you are discharged, your primary care physician will handle any further medical issues. Please note that NO REFILLS for any discharge medications will be authorized once you are discharged, as it is imperative that you return to your primary care physician (or establish a relationship with a primary care physician if you do not have one) for your aftercare needs so that they can reassess your need for medications and monitor your lab values.     Today   Abdominal pain, nausea, vomiting. Tolerating a regular diet well. Lipase normal without any evidence of pancreatitis.  VITAL SIGNS:  Blood pressure 114/80, pulse 76, temperature 98.1 F (36.7 C), temperature source Oral, resp. rate 18, height 5\' 6"  (1.676 m), weight 110.36 kg (243 lb 4.8 oz), SpO2 99 %.  I/O:   Intake/Output Summary (Last 24 hours) at 12/30/14 1426 Last data filed at 12/30/14 0800  Gross per 24 hour  Intake    340 ml  Output    300 ml  Net     40 ml    PHYSICAL EXAMINATION:   GENERAL: 64 y.o.-year-old obese patient sitting up in chair in no acute distress.  EYES: Pupils equal, round, reactive to light and accommodation. +  scleral icterus. Extraocular muscles intact.  HEENT: Head atraumatic, normocephalic. Oropharynx and nasopharynx clear.  NECK: Supple, no jugular venous distention. No thyroid enlargement, no tenderness.  LUNGS: Normal breath sounds bilaterally, no wheezing, rales, rhonchi. No use of accessory muscles of respiration.  CARDIOVASCULAR: S1, S2 normal. No murmurs, rubs, or gallops.  ABDOMEN: Soft, nontender, nondistended. Bowel sounds present. No organomegaly or mass.  EXTREMITIES: No cyanosis, clubbing or edema b/l.  NEUROLOGIC: Cranial nerves II through XII are intact. No focal Motor or sensory deficits b/l.  PSYCHIATRIC: The patient is alert and oriented x 3.  SKIN: No obvious rash, lesion, or ulcer.   DATA REVIEW:   CBC  Recent Labs Lab 12/30/14 0413  WBC 6.3  HGB 13.1  HCT 38.5*  PLT 292    Chemistries   Recent Labs Lab 12/30/14 0413  NA 138  K 4.0  CL 104  CO2 25  GLUCOSE 149*  BUN 24*  CREATININE 0.82  CALCIUM 8.8*  AST 128*  ALT 245*  ALKPHOS 323*  BILITOT 9.3*  Cardiac Enzymes No results for input(s): TROPONINI in the last 168 hours.  Microbiology Results  No results found for this or any previous visit.  RADIOLOGY:  Ct Abdomen Pelvis W Contrast  12/28/2014  CLINICAL DATA:  Weakness for 2 weeks. Painless jaundice for least 2 days. Initial encounter. EXAM: CT ABDOMEN AND PELVIS WITH CONTRAST TECHNIQUE: Multidetector CT imaging of the abdomen and pelvis was performed using the standard protocol following bolus administration of intravenous contrast. CONTRAST:  100 mL OMNIPAQUE IOHEXOL 300 MG/ML  SOLN COMPARISON:  None. FINDINGS: The lung bases are clear.  No pleural or pericardial effusion. The gallbladder is mildly distended and there is mild intrahepatic biliary ductal dilatation. The common bile duct is also somewhat prominent. The duct can be traced to the pancreatic head where it appears obliterated just proximal to the sphincter of Oddi. The  pancreatic head appears somewhat full and the pancreatic duct is minimally prominent. No focal liver lesion is identified. No lymphadenopathy is seen. The spleen, adrenal glands and right kidney appear normal. A low attenuating lesions in left kidney most consistent with cysts. The stomach, small and large bowel and appendix appear normal. There is no fluid collection. Mild aortoiliac atherosclerosis without aneurysm is noted. No lytic or sclerotic bony lesion is seen. There is scattered thoracic and lumbar spondylosis with mild degenerative change about the hips. IMPRESSION: Findings highly suspicious for a mass in the pancreatic head or at the sphincter of Oddi. MRCP with and without contrast is recommended for further evaluation. Critical Value/emergent results were called by telephone at the time of interpretation on 12/28/2014 at 3:45 pm to Dr. Conni Slipper, who verbally acknowledged these results. Electronically Signed   By: Inge Rise M.D.   On: 12/28/2014 15:48   Dg C-arm 1-60 Min-no Report  12/29/2014  CLINICAL DATA: Jaundice C-ARM 1-60 MINUTES Fluoroscopy was utilized by the requesting physician.  No radiographic interpretation.      Management plans discussed with the patient, family and they are in agreement.  CODE STATUS:     Code Status Orders        Start     Ordered   12/28/14 1802  Full code   Continuous     12/28/14 1801      TOTAL TIME TAKING CARE OF THIS PATIENT: 40 minutes.    Henreitta Leber M.D on 12/30/2014 at 2:26 PM  Between 7am to 6pm - Pager - [phone]  After 6pm go to [email or web address] - password [email or web address] [facility]  [place]  Office  [phone]  CC: Primary care physician; Keith Rake, MD

## 2014-12-30 NOTE — Progress Notes (Signed)
Pt's spouse present for discharge; pt discharged via wheelchair by nursing to the visitor's entrance 

## 2014-12-30 NOTE — Progress Notes (Signed)
MD order received in [facility] to discharge pt home today; verbally reviewed AVS with pt including home medications; diet; activity level and follow up appointments/pt to call Dr Manuella Ghazi and Dr Myrna Blazer offices to schedule appointments; appointment at [facility] made for 01/04/15; no questions voiced at this time; pt's discharge pending arrival of his spouse for a ride home

## 2015-01-02 ENCOUNTER — Telehealth: Payer: Self-pay

## 2015-01-02 ENCOUNTER — Encounter: Payer: Self-pay | Admitting: Gastroenterology

## 2015-01-02 LAB — CA 19-9 (SERIAL): CA 19-9: 28 U/mL (ref 0–35)

## 2015-01-02 NOTE — Telephone Encounter (Signed)
  Oncology Nurse Navigator Documentation  Referral date to RadOnc/MedOnc: 01/02/15 (01/02/15 1100) Navigator Encounter Type: Introductory phone call (01/02/15 1100)         Interventions: Coordination of Care (01/02/15 1100)   Coordination of Care: EUS (01/02/15 1100)        Time Spent with Patient: 30 (01/02/15 1100)   Called and spoke with patient on the phone. He needs EUS scheduled for pancreatic mass. No appts available at [facility] until 01-25-15. He voiced he can go to [name] for procedure. Spoke with Waynetta Sandy at [facility] and she will have EUS scheduled and make me aware of date. Patient is also on Xarelto and he is aware of 48 hour hold before procedure. [facility] made aware of patient being on Xarelto as well.

## 2015-01-03 ENCOUNTER — Telehealth: Payer: Self-pay | Admitting: Gastroenterology

## 2015-01-03 NOTE — Telephone Encounter (Signed)
Patient would like to talk to you. He saw Dr. Allen Norris because Dr. Candace Cruise was on vacation.

## 2015-01-03 NOTE — Telephone Encounter (Signed)
LVM for pt to return my call.

## 2015-01-04 ENCOUNTER — Ambulatory Visit: Payer: 59

## 2015-01-04 NOTE — Telephone Encounter (Signed)
Spoke with pt regarding his ERCP procedure. Pt needs the pancreatic stent removed within 2 weeks of having it placed per Dr. Allen Norris. Pt is scheduled for his EUS at [facility] on Tuesday, Jan 3rd. Pt will call Dr. Myrna Blazer office to see when he returns from vacation. I told him Dr. Allen Norris will be happy to remove it if Dr. Candace Cruise has not returned. Pt will call back with his decision.

## 2015-01-07 DIAGNOSIS — C8590 Non-Hodgkin lymphoma, unspecified, unspecified site: Secondary | ICD-10-CM

## 2015-01-07 HISTORY — DX: Non-Hodgkin lymphoma, unspecified, unspecified site: C85.90

## 2015-01-09 ENCOUNTER — Telehealth: Payer: Self-pay | Admitting: Gastroenterology

## 2015-01-09 NOTE — Telephone Encounter (Signed)
Patient returned call from last week, He was just following up to let you know he had the stent removed and doing fine for now. Patient is a Dr.Oh patient but had seen [place] and contacted [place] since he was out of town.

## 2015-01-10 ENCOUNTER — Telehealth: Payer: Self-pay | Admitting: *Deleted

## 2015-01-10 NOTE — Telephone Encounter (Signed)
FYI..  Pancreatic stent was removed during an EUS on 01/09/15 at [facility].

## 2015-01-10 NOTE — Telephone Encounter (Signed)
Called pt home and left message on cell phone that we would like to make appt to see Dr. Rudean Hitt on Friday 01/12/15 at 1:30 to see md for results.  Asked to please call me when they get message to confirm that would be ok with them

## 2015-01-11 ENCOUNTER — Telehealth: Payer: Self-pay

## 2015-01-11 NOTE — Telephone Encounter (Signed)
  Oncology Nurse Navigator Documentation    Navigator Encounter Type: Telephone (01/11/15 1700)                                          Time Spent with Patient: 15 (01/11/15 1700)  After speaking with Dr Rudean Hitt, Attempted to call patient and notify of pathology results and that he does not need to come to appt 01/12/15. Unable to reach with both numbers he provided. Message left on home phone to return call.

## 2015-01-12 ENCOUNTER — Inpatient Hospital Stay: Payer: 59

## 2015-01-12 ENCOUNTER — Telehealth: Payer: Self-pay

## 2015-01-12 ENCOUNTER — Encounter: Payer: Self-pay | Admitting: Family Medicine

## 2015-01-12 ENCOUNTER — Ambulatory Visit (INDEPENDENT_AMBULATORY_CARE_PROVIDER_SITE_OTHER): Payer: 59 | Admitting: Family Medicine

## 2015-01-12 VITALS — BP 122/67 | HR 78 | Temp 98.2°F | Resp 17 | Ht 66.0 in | Wt 247.0 lb

## 2015-01-12 DIAGNOSIS — I10 Essential (primary) hypertension: Secondary | ICD-10-CM | POA: Diagnosis not present

## 2015-01-12 DIAGNOSIS — K869 Disease of pancreas, unspecified: Secondary | ICD-10-CM

## 2015-01-12 DIAGNOSIS — K8689 Other specified diseases of pancreas: Secondary | ICD-10-CM | POA: Insufficient documentation

## 2015-01-12 NOTE — Telephone Encounter (Signed)
  Oncology Nurse Navigator Documentation    Navigator Encounter Type: Telephone (01/12/15 0900)                                          Time Spent with Patient: 15 (01/12/15 0900)   Spoke with Mason Houston on the phone. Given the results of pancreatic head biopsy as atypical/inconclusive. Ampulla biopsy is still pending so he does not have to come to appt today. We will be in contact as soon as all the results are in. He verbalized understanding.

## 2015-01-12 NOTE — Progress Notes (Signed)
Name: Mason Houston   MRN: [id number]    DOB: 1950/08/29   Date:01/12/2015       Progress Note  Subjective  Chief Complaint  Chief Complaint  Patient presents with  . Follow-up    ER/ Yellow body, eyes, etc    Hypertension This is a chronic problem. The problem is unchanged. The problem is controlled. Associated symptoms include blurred vision (improved, followed by Ophthalmology.). Pertinent negatives include no chest pain, headaches or palpitations. Past treatments include angiotensin blockers.    Pt. Is here for ER and Hospital Follow up. Being followed by gastroenterology for a possible Pancreatic Mass. Also referred to [facility] for an Endoscopic Ultrasound. Records from gastroenterology including EUS from [facility]  reviewed in detail.  Past Medical History  Diagnosis Date  . GERD (gastroesophageal reflux disease)   . Osteoarthrosis, unspecified whether generalized or localized, lower leg   . H/O Bell's palsy   . History of [place] measles   . Atrial fibrillation ([place])   . Hypertension   . Mini stroke ([place])   . Sixth cranial nerve palsy     Past Surgical History  Procedure Laterality Date  . Cyst excision    . Trigger finger release    . Ercp N/A 12/29/2014    Procedure: ENDOSCOPIC RETROGRADE CHOLANGIOPANCREATOGRAPHY (ERCP);  Surgeon: Hulen Luster, MD;  Location: [facility] ENDOSCOPY;  Service: Endoscopy;  Laterality: N/A;    Family History  Problem Relation Age of Onset  . Hypertension Mother     Social History   Social History  . Marital Status: Married    Spouse Name: N/A  . Number of Children: N/A  . Years of Education: N/A   Occupational History  . Not on file.   Social History Main Topics  . Smoking status: Former Smoker -- 4.50 packs/day for 15 years    Types: Cigarettes  . Smokeless tobacco: Not on file  . Alcohol Use: No  . Drug Use: Yes    Special: Marijuana     Comment: PAST  . Sexual Activity: Not on file   Other Topics Concern  . Not on file    Social History Narrative     Current outpatient prescriptions:  .  B Complex-C (SUPER B COMPLEX PO), Take by mouth daily., Disp: , Rfl:  .  baclofen (LIORESAL) 10 MG tablet, Take 10 mg by mouth as needed for muscle spasms., Disp: , Rfl:  .  diltiazem (TAZTIA XT) 360 MG 24 hr capsule, TAKE 1 CAPSULE BY MOUTH EVERY DAY, Disp: 30 capsule, Rfl: 11 .  loratadine (CLARITIN) 10 MG tablet, Take 10 mg by mouth daily., Disp: , Rfl:  .  losartan (COZAAR) 100 MG tablet, TAKE 1 TABLET BY MOUTH EVERY DAY, Disp: 90 tablet, Rfl: 3 .  magnesium oxide (MAG-OX) 400 MG tablet, Take 400 mg by mouth daily., Disp: , Rfl:  .  Melatonin 5 MG TABS, Take by mouth as needed. , Disp: , Rfl:  .  methocarbamol (ROBAXIN) 750 MG tablet, Take 750 mg by mouth every 12 (twelve) hours. , Disp: , Rfl: 3 .  ranitidine (ZANTAC) 150 MG tablet, Take 150 mg by mouth at bedtime., Disp: , Rfl:  .  XARELTO 20 MG TABS tablet, TAKE 1 TABLET BY MOUTH EVERY DAY WITH DINNER, Disp: 30 tablet, Rfl: 6  Allergies  Allergen Reactions  . Celebrex [Celecoxib]   . Penicillins     Hives     Review of Systems  Eyes: Positive for blurred vision (improved, followed  by Ophthalmology.).  Cardiovascular: Negative for chest pain and palpitations.  Gastrointestinal: Negative for nausea, vomiting, abdominal pain, diarrhea, blood in stool (stool in lighter in color) and melena.  Genitourinary: Negative for dysuria, urgency, frequency and hematuria.  Neurological: Negative for headaches.    Objective  Filed Vitals:   01/12/15 1029  BP: 122/67  Pulse: 78  Temp: 98.2 F (36.8 C)  TempSrc: Oral  Resp: 17  Height: 5\' 6"  (1.676 m)  Weight: 247 lb (112.038 kg)  SpO2: 97%    Physical Exam  Constitutional: He is oriented to person, place, and time and well-developed, well-nourished, and in no distress.  HENT:  Head: Normocephalic and atraumatic.  Cardiovascular: Normal rate, regular rhythm and normal heart sounds.   Pulmonary/Chest:  Effort normal and breath sounds normal.  Abdominal: Soft. Bowel sounds are normal.  Neurological: He is alert and oriented to person, place, and time.  Skin: Skin is warm and dry.  Psychiatric: Mood, memory, affect and judgment normal.  Nursing note and vitals reviewed.   Assessment & Plan  1. Pancreatic mass Being followed by gastroenterology and had endoscopic ultrasound done at [facility]. Will  follow-up as scheduled.  2. Essential hypertension BP stable and at goal on present therapy.  Mason Houston [facility] 01/12/2015 10:57 AM

## 2015-01-16 NOTE — Progress Notes (Signed)
  Oncology Nurse Navigator Documentation  Navigator Location: [facility] Onc (01/16/15 1600)                                            Time Spent with Patient: 15 (01/16/15 1600)   Results from EUS ampulla biopsy are still pending as of 1630 01/16/15

## 2015-01-19 ENCOUNTER — Other Ambulatory Visit: Payer: Self-pay | Admitting: *Deleted

## 2015-01-19 ENCOUNTER — Other Ambulatory Visit: Payer: Self-pay | Admitting: Internal Medicine

## 2015-01-19 ENCOUNTER — Telehealth: Payer: Self-pay | Admitting: *Deleted

## 2015-01-19 DIAGNOSIS — C8330 Diffuse large B-cell lymphoma, unspecified site: Secondary | ICD-10-CM

## 2015-01-19 NOTE — Telephone Encounter (Signed)
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  He needs pet scan and it has been arranged for 1/18 [facility] at 11:30.  Went over the special instructions for a non diabetic.  Also gave him f/u appt for Friday 1/20 and pt states he would not mind going to [facility] because he has been there before so I gave him appt for 1/19 9:30 in [place] to see md.  I will send inbasket asking for the f/u to be changed.  Pt agreable to the plan

## 2015-01-24 ENCOUNTER — Ambulatory Visit
Admission: RE | Admit: 2015-01-24 | Discharge: 2015-01-24 | Disposition: A | Discharge status: Home / Self Care | Payer: 59 | Source: Ambulatory Visit | Attending: Internal Medicine | Admitting: Internal Medicine

## 2015-01-24 DIAGNOSIS — C8330 Diffuse large B-cell lymphoma, unspecified site: Secondary | ICD-10-CM | POA: Insufficient documentation

## 2015-01-24 DIAGNOSIS — R911 Solitary pulmonary nodule: Secondary | ICD-10-CM | POA: Diagnosis not present

## 2015-01-24 DIAGNOSIS — K869 Disease of pancreas, unspecified: Secondary | ICD-10-CM | POA: Diagnosis not present

## 2015-01-24 DIAGNOSIS — Z0189 Encounter for other specified special examinations: Secondary | ICD-10-CM | POA: Insufficient documentation

## 2015-01-24 LAB — GLUCOSE, CAPILLARY: Glucose-Capillary: 82 mg/dL (ref 65–99)

## 2015-01-24 MED ORDER — FLUDEOXYGLUCOSE F - 18 (FDG) INJECTION
12.7100 | Freq: Once | INTRAVENOUS | Status: AC | PRN
  Administered 2015-01-24: 12.71 via INTRAVENOUS

## 2015-01-25 ENCOUNTER — Inpatient Hospital Stay: Payer: 59

## 2015-01-25 ENCOUNTER — Encounter: Payer: Self-pay | Admitting: Internal Medicine

## 2015-01-25 ENCOUNTER — Inpatient Hospital Stay: Payer: 59 | Attending: Internal Medicine | Admitting: Internal Medicine

## 2015-01-25 VITALS — BP 147/73 | HR 93 | Temp 97.7°F | Ht 67.13 in | Wt 247.1 lb

## 2015-01-25 DIAGNOSIS — I10 Essential (primary) hypertension: Secondary | ICD-10-CM | POA: Diagnosis not present

## 2015-01-25 DIAGNOSIS — Z7901 Long term (current) use of anticoagulants: Secondary | ICD-10-CM | POA: Diagnosis not present

## 2015-01-25 DIAGNOSIS — I4891 Unspecified atrial fibrillation: Secondary | ICD-10-CM | POA: Diagnosis not present

## 2015-01-25 DIAGNOSIS — C8330 Diffuse large B-cell lymphoma, unspecified site: Secondary | ICD-10-CM

## 2015-01-25 DIAGNOSIS — Z79899 Other long term (current) drug therapy: Secondary | ICD-10-CM | POA: Diagnosis not present

## 2015-01-25 DIAGNOSIS — Z87891 Personal history of nicotine dependence: Secondary | ICD-10-CM | POA: Diagnosis not present

## 2015-01-25 DIAGNOSIS — Z8673 Personal history of transient ischemic attack (TIA), and cerebral infarction without residual deficits: Secondary | ICD-10-CM | POA: Diagnosis not present

## 2015-01-25 DIAGNOSIS — K219 Gastro-esophageal reflux disease without esophagitis: Secondary | ICD-10-CM | POA: Diagnosis not present

## 2015-01-25 LAB — LACTATE DEHYDROGENASE: LDH: 151 U/L (ref 98–192)

## 2015-01-25 LAB — CBC WITH DIFFERENTIAL/PLATELET
Basophils Absolute: 0.1 10*3/uL (ref 0–0.1)
Basophils Relative: 1 %
Eosinophils Absolute: 0.2 10*3/uL (ref 0–0.7)
Eosinophils Relative: 2 %
HCT: 38.9 % — ABNORMAL LOW (ref 40.0–52.0)
Hemoglobin: 13.3 g/dL (ref 13.0–18.0)
Lymphocytes Relative: 21 %
Lymphs Abs: 1.7 10*3/uL (ref 1.0–3.6)
MCH: 28.9 pg (ref 26.0–34.0)
MCHC: 34.1 g/dL (ref 32.0–36.0)
MCV: 84.7 fL (ref 80.0–100.0)
Monocytes Absolute: 0.8 10*3/uL (ref 0.2–1.0)
Monocytes Relative: 10 %
Neutro Abs: 5.4 10*3/uL (ref 1.4–6.5)
Neutrophils Relative %: 66 %
Platelets: 336 10*3/uL (ref 150–440)
RBC: 4.59 MIL/uL (ref 4.40–5.90)
RDW: 16 % — ABNORMAL HIGH (ref 11.5–14.5)
WBC: 8.3 10*3/uL (ref 3.8–10.6)

## 2015-01-25 LAB — COMPREHENSIVE METABOLIC PANEL
ALT: 34 U/L (ref 17–63)
AST: 29 U/L (ref 15–41)
Albumin: 4.1 g/dL (ref 3.5–5.0)
Alkaline Phosphatase: 117 U/L (ref 38–126)
Anion gap: 9 (ref 5–15)
BUN: 21 mg/dL — ABNORMAL HIGH (ref 6–20)
CO2: 27 mmol/L (ref 22–32)
Calcium: 9.4 mg/dL (ref 8.9–10.3)
Chloride: 102 mmol/L (ref 101–111)
Creatinine, Ser: 1.18 mg/dL (ref 0.61–1.24)
GFR calc Af Amer: >60 mL/min (ref >60)
GFR calc non Af Amer: >60 mL/min (ref >60)
Glucose, Bld: 129 mg/dL — ABNORMAL HIGH (ref 65–99)
Potassium: 4.6 mmol/L (ref 3.5–5.1)
Sodium: 138 mmol/L (ref 135–145)
Total Bilirubin: 1.4 mg/dL — ABNORMAL HIGH (ref 0.3–1.2)
Total Protein: 8.2 g/dL — ABNORMAL HIGH (ref 6.5–8.1)

## 2015-01-25 NOTE — Progress Notes (Signed)
Mason Houston [phone]  Fax:([phone]) [place] OB: 07-11-1950  MR#: [id number]  ZWC#:[id number]  Patient Care Team: Roselee Nova, MD as PCP - General (Internal Medicine)  CHIEF COMPLAINT: No chief complaint on file.    No history exists.    No flowsheet data found.  HISTORY OF PRESENT ILLNESS:   Mason Houston is 65 year old gentleman with a history of mini strokes, atrial fibrillation, who developed painless jaundice over several weeks in late 2016. He was admitted to the hospital in December 2016, and underwent evaluation, which revealed fullness of the pancreatic head with a suspicious mass, and associated biliary dilatation. He underwent ERCP on 12/29/2014 with placement of 2 stents. He tolerated the procedure well, has not had fever, chills, nausea, vomiting, diarrhea, constipation. Ultrasound-guided biopsy of the pancreatic head mass was obtained. 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 More specific diagnosis was difficult due to limited amount of material available for review. PET/CT scan showed a single lesion with high SUV in the head of the pancreas. No additional lymphadenopathy was identified.  Current status- Mason Houston returns to our clinic to discuss the results of the workup and make plans regarding the treatment. He has done well since his discharge, denying abdominal pain, nausea, vomiting, jaundice, fevers, chills, bleeding.  REVIEW OF SYSTEMS:   Review of Systems  All other systems reviewed and are negative.    PAST MEDICAL HISTORY: Past Medical History  Diagnosis Date  . GERD (gastroesophageal reflux disease)   . Osteoarthrosis, unspecified whether generalized or localized, lower leg   . H/O Bell's palsy   . History of [place] measles   . Atrial fibrillation ([place])   . Hypertension   . Mini stroke ([place])   . Sixth  cranial nerve palsy     PAST SURGICAL HISTORY: Past Surgical History  Procedure Laterality Date  . Cyst excision    . Trigger finger release    . Ercp N/A 12/29/2014    Procedure: ENDOSCOPIC RETROGRADE CHOLANGIOPANCREATOGRAPHY (ERCP);  Surgeon: Hulen Luster, MD;  Location: [facility] ENDOSCOPY;  Service: Endoscopy;  Laterality: N/A;    FAMILY HISTORY Family History  Problem Relation Age of Onset  . Hypertension Mother     ADVANCED DIRECTIVES:  No flowsheet data found.  HEALTH MAINTENANCE: Social History  Substance Use Topics  . Smoking status: Former Smoker -- 4.50 packs/day for 15 years    Types: Cigarettes  . Smokeless tobacco: Not on file  . Alcohol Use: No     Allergies  Allergen Reactions  . Celebrex [Celecoxib]   . Penicillins     Hives     Current Outpatient Prescriptions  Medication Sig Dispense Refill  . B Complex-C (SUPER B COMPLEX PO) Take by mouth daily.    . baclofen (LIORESAL) 10 MG tablet Take 10 mg by mouth as needed for muscle spasms.    Marland Kitchen diltiazem (TAZTIA XT) 360 MG 24 hr capsule TAKE 1 CAPSULE BY MOUTH EVERY DAY 30 capsule 11  . loratadine (CLARITIN) 10 MG tablet Take 10 mg by mouth daily.    Marland Kitchen losartan (COZAAR) 100 MG tablet TAKE 1 TABLET BY MOUTH EVERY DAY 90 tablet 3  . magnesium oxide (MAG-OX) 400 MG tablet Take 400 mg by mouth daily.    . Melatonin 5 MG TABS Take by mouth as needed.     . methocarbamol (ROBAXIN) 750 MG tablet Take  750 mg by mouth every 12 (twelve) hours.   3  . ranitidine (ZANTAC) 150 MG tablet Take 150 mg by mouth at bedtime.    Alveda Reasons 20 MG TABS tablet TAKE 1 TABLET BY MOUTH EVERY DAY WITH DINNER 30 tablet 6   No current facility-administered medications for this visit.    OBJECTIVE:  Filed Vitals:   01/25/15 1001  BP: 147/73  Pulse: 93  Temp: 97.7 F (36.5 C)     Body mass index is 38.56 kg/(m^2).    ECOG FS:0 - Asymptomatic  Physical Exam  Constitutional: He is oriented to person, place, and time and  well-developed, well-nourished, and in no distress. No distress.  Obese Caucasian male.  HENT:  Head: Normocephalic and atraumatic.  Right Ear: External ear normal.  Left Ear: External ear normal.  Nose: Nose normal.  Mouth/Throat: Oropharynx is clear and moist. No oropharyngeal exudate.  Eyes: Conjunctivae and EOM are normal. Pupils are equal, round, and reactive to light. Right eye exhibits no discharge. Left eye exhibits no discharge. No scleral icterus.  Neck: Normal range of motion. Neck supple. No JVD present. No tracheal deviation present. No thyromegaly present.  Cardiovascular: Normal rate, regular rhythm, normal heart sounds and intact distal pulses.  Exam reveals no gallop and no friction rub.   No murmur heard. Pulmonary/Chest: Effort normal and breath sounds normal. No stridor. No respiratory distress. He has no wheezes. He has no rales. He exhibits no tenderness.  Abdominal: Soft. Bowel sounds are normal. He exhibits no distension and no mass. There is no tenderness. There is no rebound and no guarding.  Genitourinary:  Patient deferred  Musculoskeletal: Normal range of motion. He exhibits no edema or tenderness.  Lymphadenopathy:    He has no cervical adenopathy.  Neurological: He is alert and oriented to person, place, and time. He has normal reflexes. No cranial nerve deficit. He exhibits normal muscle tone. Gait normal. Coordination normal. GCS score is 15.  Skin: Skin is warm and dry. No rash noted. He is not diaphoretic. No erythema. No pallor.  Psychiatric: Mood, memory, affect and judgment normal.  Nursing note and vitals reviewed.    LAB RESULTS:  CBC Latest Ref Rng 12/30/2014 12/29/2014  WBC 3.8 - 10.6 K/uL 6.3 5.6  Hemoglobin 13.0 - 18.0 g/dL 13.1 13.2  Hematocrit 40.0 - 52.0 % 38.5(L) 39.0(L)  Platelets 150 - 440 K/uL 292 298    Hospital Outpatient Visit on 01/24/2015  Component Date Value Ref Range Status  . Glucose-Capillary 01/24/2015 82  65 - 99  mg/dL Final    STUDIES: Ct Abdomen Pelvis W Contrast  12/28/2014  CLINICAL DATA:  Weakness for 2 weeks. Painless jaundice for least 2 days. Initial encounter. EXAM: CT ABDOMEN AND PELVIS WITH CONTRAST TECHNIQUE: Multidetector CT imaging of the abdomen and pelvis was performed using the standard protocol following bolus administration of intravenous contrast. CONTRAST:  100 mL OMNIPAQUE IOHEXOL 300 MG/ML  SOLN COMPARISON:  None. FINDINGS: The lung bases are clear.  No pleural or pericardial effusion. The gallbladder is mildly distended and there is mild intrahepatic biliary ductal dilatation. The common bile duct is also somewhat prominent. The duct can be traced to the pancreatic head where it appears obliterated just proximal to the sphincter of Oddi. The pancreatic head appears somewhat full and the pancreatic duct is minimally prominent. No focal liver lesion is identified. No lymphadenopathy is seen. The spleen, adrenal glands and right kidney appear normal. A low attenuating lesions in left kidney  most consistent with cysts. The stomach, small and large bowel and appendix appear normal. There is no fluid collection. Mild aortoiliac atherosclerosis without aneurysm is noted. No lytic or sclerotic bony lesion is seen. There is scattered thoracic and lumbar spondylosis with mild degenerative change about the hips. IMPRESSION: Findings highly suspicious for a mass in the pancreatic head or at the sphincter of Oddi. MRCP with and without contrast is recommended for further evaluation. Critical Value/emergent results were called by telephone at the time of interpretation on 12/28/2014 at 3:45 pm to Dr. Conni Slipper, who verbally acknowledged these results. Electronically Signed   By: Inge Rise M.D.   On: 12/28/2014 15:48   Nm Pet Image Initial (pi) Skull Base To Thigh  01/24/2015  CLINICAL DATA:  Initial treatment strategy for lymphoma. Pancreatic head mass with pathology revealing lymphoma. EXAM:  NUCLEAR MEDICINE PET SKULL BASE TO THIGH TECHNIQUE: 12.7 mCi F-18 FDG was injected intravenously. Full-ring PET imaging was performed from the skull base to thigh after the radiotracer. CT data was obtained and used for attenuation correction and anatomic localization. FASTING BLOOD GLUCOSE:  Value: 100 mg/dl COMPARISON:  CT 12/28/2014 FINDINGS: NECK No hypermetabolic lymph nodes in the neck. CHEST 5 mm nodule in the RIGHT upper lobe on image 77, series 3. No additional pulmonary nodules. No hypermetabolic mediastinal lymph nodes. ABDOMEN/PELVIS Hypermetabolic focal lesion in the head of the pancreas with SUV max equal 17.2. There is a 21 mm fullness at this level. Stent within the common bile duct entering the duodenum. No abnormal metabolic activity in the liver. No hypermetabolic abdominal or pelvic lymph nodes. No abnormal bowel activity. SKELETON No focal hypermetabolic activity to suggest skeletal metastasis. IMPRESSION: 1. Single focus of hypermetabolic activity localizing to the pancreatic head where a small mass lesions resides. 2. No evidence of distant metastasis. 3. Small RIGHT upper lobe pulmonary nodule is likely benign and unlikely a solitary metastatic lesion. Recommend attention on follow-up. Electronically Signed   By: Suzy Bouchard M.D.   On: 01/24/2015 14:32   Dg C-arm 1-60 Min-no Report  12/29/2014  CLINICAL DATA: Jaundice C-ARM 1-60 MINUTES Fluoroscopy was utilized by the requesting physician.  No radiographic interpretation.    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 He appears to have a limited disease, which should be treated according to NCCN guidelines, which include chemotherapy with R CHOP 3 followed by radiation. I do not think that bone marrow biopsy is going to provide [place] with any  additional information, since he does not have cytopenias, and PET/CT scan showed no involvement of lymph nodes any place else. We will need to check echo to evaluate ejection fraction, and do hepatitis B testing. We will need to check LDH to better establish IPI score. Hopefully, he still will be in a low risk disease, which is associated with approximately 90% chance of achieving complete response and 75% chance of surviving for 5 years. He will need a Port-A-Cath placement for R CHOP chemotherapy, and he will have to hold his Xarelto  He is probably taking for 48 hours prior to the procedure.  Patient expressed understanding and was in agreement with this plan. He also understands that He can call clinic at any time with any questions, concerns, or complaints.    No matching staging information was found for the patient.  Jamareon Shimel,  MD   01/25/2015 9:27 AM

## 2015-01-25 NOTE — Progress Notes (Signed)
New Dx B cell lymphoma in pancreas. Jaundice has cleared. Pt has 1 remaining stent in bile duct. He denies nausea. Has a lot of Air that he passes rectally since all of his procedures. 1 regular formed stool per day. Appetite is normal. Denies abd pain. Has R leg pain from arthritis (knee, hip and side pain). Reports sleeping well. Does have sleep apnea and uses c pap. Pt here today for PET results and Treatment planning.

## 2015-01-26 ENCOUNTER — Ambulatory Visit: Payer: 59

## 2015-01-26 LAB — HEPATITIS B SURFACE ANTIGEN: Hepatitis B Surface Ag: NEGATIVE

## 2015-01-26 LAB — HEPATITIS B CORE ANTIBODY, TOTAL: Hep B Core Total Ab: NEGATIVE

## 2015-01-26 LAB — HEPATITIS B SURFACE ANTIBODY, QUANTITATIVE: Hepatitis B-Post: <3.1 m[IU]/mL — ABNORMAL LOW (ref >9.9)

## 2015-01-26 NOTE — Patient Instructions (Signed)
Rituximab injection What is this medicine? RITUXIMAB (ri TUX i mab) is a monoclonal antibody. It is used commonly to treat non-Hodgkin lymphoma and other conditions. It is also used to treat rheumatoid arthritis (RA). In RA, this medicine slows the inflammatory process and help reduce joint pain and swelling. This medicine is often used with other cancer or arthritis medications. This medicine may be used for other purposes; ask your health care provider or pharmacist if you have questions. What should I tell my health care provider before I take this medicine? They need to know if you have any of these conditions: -blood disorders -heart disease -history of hepatitis B -infection (especially a virus infection such as chickenpox, cold sores, or herpes) -irregular heartbeat -kidney disease -lung or breathing disease, like asthma -lupus -an unusual or allergic reaction to rituximab, mouse proteins, other medicines, foods, dyes, or preservatives -pregnant or trying to get pregnant -breast-feeding How should I use this medicine? This medicine is for infusion into a vein. It is administered in a hospital or clinic by a specially trained health care professional. A special MedGuide will be given to you by the pharmacist with each prescription and refill. Be sure to read this information carefully each time. Talk to your pediatrician regarding the use of this medicine in children. This medicine is not approved for use in children. Overdosage: If you think you have taken too much of this medicine contact a poison control center or emergency room at once. NOTE: This medicine is only for you. Do not share this medicine with others. What if I miss a dose? It is important not to miss a dose. Call your doctor or health care professional if you are unable to keep an appointment. What may interact with this medicine? -cisplatin -medicines for blood pressure -some other medicines for  arthritis -vaccines This list may not describe all possible interactions. Give your health care provider a list of all the medicines, herbs, non-prescription drugs, or dietary supplements you use. Also tell them if you smoke, drink alcohol, or use illegal drugs. Some items may interact with your medicine. What should I watch for while using this medicine? Report any side effects that you notice during your treatment right away, such as changes in your breathing, fever, chills, dizziness or lightheadedness. These effects are more common with the first dose. Visit your prescriber or health care professional for checks on your progress. You will need to have regular blood work. Report any other side effects. The side effects of this medicine can continue after you finish your treatment. Continue your course of treatment even though you feel ill unless your doctor tells you to stop. Call your doctor or health care professional for advice if you get a fever, chills or sore throat, or other symptoms of a cold or flu. Do not treat yourself. This drug decreases your body's ability to fight infections. Try to avoid being around people who are sick. This medicine may increase your risk to bruise or bleed. Call your doctor or health care professional if you notice any unusual bleeding. Be careful brushing and flossing your teeth or using a toothpick because you may get an infection or bleed more easily. If you have any dental work done, tell your dentist you are receiving this medicine. Avoid taking products that contain aspirin, acetaminophen, ibuprofen, naproxen, or ketoprofen unless instructed by your doctor. These medicines may hide a fever. Do not become pregnant while taking this medicine. Women should inform their doctor if  they wish to become pregnant or think they might be pregnant. There is a potential for serious side effects to an unborn child. Talk to your health care professional or pharmacist for more  information. Do not breast-feed an infant while taking this medicine. What side effects may I notice from receiving this medicine? Side effects that you should report to your doctor or health care professional as soon as possible: -allergic reactions like skin rash, itching or hives, swelling of the face, lips, or tongue -low blood counts - this medicine may decrease the number of white blood cells, red blood cells and platelets. You may be at increased risk for infections and bleeding. -signs of infection - fever or chills, cough, sore throat, pain or difficulty passing urine -signs of decreased platelets or bleeding - bruising, pinpoint red spots on the skin, black, tarry stools, blood in the urine -signs of decreased red blood cells - unusually weak or tired, fainting spells, lightheadedness -breathing problems -confused, not responsive -chest pain -fast, irregular heartbeat -feeling faint or lightheaded, falls -mouth sores -redness, blistering, peeling or loosening of the skin, including inside the mouth -stomach pain -swelling of the ankles, feet, or hands -trouble passing urine or change in the amount of urine Side effects that usually do not require medical attention (report to your doctor or other health care professional if they continue or are bothersome): -anxiety -headache -loss of appetite -muscle aches -nausea -night sweats This list may not describe all possible side effects. Call your doctor for medical advice about side effects. You may report side effects to FDA at 1-800-FDA-1088. Where should I keep my medicine? This drug is given in a hospital or clinic and will not be stored at home. NOTE: This sheet is a summary. It may not cover all possible information. If you have questions about this medicine, talk to your doctor, pharmacist, or health care provider.    2016, [employer]. (2014-03-01 22:30:56)  Doxorubicin injection What is this  medicine? DOXORUBICIN (dox oh ROO bi sin) is a chemotherapy drug. It is used to treat many kinds of cancer like Hodgkin's disease, leukemia, non-Hodgkin's lymphoma, neuroblastoma, sarcoma, and Wilms' tumor. It is also used to treat bladder cancer, breast cancer, lung cancer, ovarian cancer, stomach cancer, and thyroid cancer. This medicine may be used for other purposes; ask your health care provider or pharmacist if you have questions. What should I tell my health care provider before I take this medicine? They need to know if you have any of these conditions: -blood disorders -heart disease, recent heart attack -infection (especially a virus infection such as chickenpox, cold sores, or herpes) -irregular heartbeat -liver disease -recent or ongoing radiation therapy -an unusual or allergic reaction to doxorubicin, other chemotherapy agents, other medicines, foods, dyes, or preservatives -pregnant or trying to get pregnant -breast-feeding How should I use this medicine? This drug is given as an infusion into a vein. It is administered in a hospital or clinic by a specially trained health care professional. If you have pain, swelling, burning or any unusual feeling around the site of your injection, tell your health care professional right away. Talk to your pediatrician regarding the use of this medicine in children. Special care may be needed. Overdosage: If you think you have taken too much of this medicine contact a poison control center or emergency room at once. NOTE: This medicine is only for you. Do not share this medicine with others. What if I miss a dose? It is important   not to miss your dose. Call your doctor or health care professional if you are unable to keep an appointment. What may interact with this medicine? Do not take this medicine with any of the following medications: -cisapride -droperidol -halofantrine -pimozide -zidovudine This medicine may also interact with the  following medications: -chloroquine -chlorpromazine -clarithromycin -cyclophosphamide -cyclosporine -erythromycin -medicines for depression, anxiety, or psychotic disturbances -medicines for irregular heart beat like amiodarone, bepridil, dofetilide, encainide, flecainide, propafenone, quinidine -medicines for seizures like ethotoin, fosphenytoin, phenytoin -medicines for nausea, vomiting like dolasetron, ondansetron, palonosetron -medicines to increase blood counts like filgrastim, pegfilgrastim, sargramostim -methadone -methotrexate -pentamidine -progesterone -vaccines -verapamil Talk to your doctor or health care professional before taking any of these medicines: -acetaminophen -aspirin -ibuprofen -ketoprofen -naproxen This list may not describe all possible interactions. Give your health care provider a list of all the medicines, herbs, non-prescription drugs, or dietary supplements you use. Also tell them if you smoke, drink alcohol, or use illegal drugs. Some items may interact with your medicine. What should I watch for while using this medicine? Your condition will be monitored carefully while you are receiving this medicine. You will need important blood work done while you are taking this medicine. This drug may make you feel generally unwell. This is not uncommon, as chemotherapy can affect healthy cells as well as cancer cells. Report any side effects. Continue your course of treatment even though you feel ill unless your doctor tells you to stop. Your urine may turn red for a few days after your dose. This is not blood. If your urine is dark or brown, call your doctor. In some cases, you may be given additional medicines to help with side effects. Follow all directions for their use. Call your doctor or health care professional for advice if you get a fever, chills or sore throat, or other symptoms of a cold or flu. Do not treat yourself. This drug decreases your body's  ability to fight infections. Try to avoid being around people who are sick. This medicine may increase your risk to bruise or bleed. Call your doctor or health care professional if you notice any unusual bleeding. Be careful brushing and flossing your teeth or using a toothpick because you may get an infection or bleed more easily. If you have any dental work done, tell your dentist you are receiving this medicine. Avoid taking products that contain aspirin, acetaminophen, ibuprofen, naproxen, or ketoprofen unless instructed by your doctor. These medicines may hide a fever. Men and women of childbearing age should use effective birth control methods while using taking this medicine. Do not become pregnant while taking this medicine. There is a potential for serious side effects to an unborn child. Talk to your health care professional or pharmacist for more information. Do not breast-feed an infant while taking this medicine. Do not let others touch your urine or other body fluids for 5 days after each treatment with this medicine. Caregivers should wear latex gloves to avoid touching body fluids during this time. There is a maximum amount of this medicine you should receive throughout your life. The amount depends on the medical condition being treated and your overall health. Your doctor will watch how much of this medicine you receive in your lifetime. Tell your doctor if you have taken this medicine before. What side effects may I notice from receiving this medicine? Side effects that you should report to your doctor or health care professional as soon as possible: -allergic reactions like skin rash,   itching or hives, swelling of the face, lips, or tongue -low blood counts - this medicine may decrease the number of white blood cells, red blood cells and platelets. You may be at increased risk for infections and bleeding. -signs of infection - fever or chills, cough, sore throat, pain or difficulty  passing urine -signs of decreased platelets or bleeding - bruising, pinpoint red spots on the skin, black, tarry stools, blood in the urine -signs of decreased red blood cells - unusually weak or tired, fainting spells, lightheadedness -breathing problems -chest pain -fast, irregular heartbeat -mouth sores -nausea, vomiting -pain, swelling, redness at site where injected -pain, tingling, numbness in the hands or feet -swelling of ankles, feet, or hands -unusual bleeding or bruising Side effects that usually do not require medical attention (report to your doctor or health care professional if they continue or are bothersome): -diarrhea -facial flushing -hair loss -loss of appetite -missed menstrual periods -nail discoloration or damage -red or watery eyes -red colored urine -stomach upset This list may not describe all possible side effects. Call your doctor for medical advice about side effects. You may report side effects to FDA at 1-800-FDA-1088. Where should I keep my medicine? This drug is given in a hospital or clinic and will not be stored at home. NOTE: This sheet is a summary. It may not cover all possible information. If you have questions about this medicine, talk to your doctor, pharmacist, or health care provider.    2016, [employer] Standard. (2012-04-20 09:54:34)  Vincristine injection What is this medicine? VINCRISTINE (vin KRIS teen) is a chemotherapy drug. It slows the growth of cancer cells. This medicine is used to treat many types of cancer like Hodgkin's disease, leukemia, non-Hodgkin's lymphoma, neuroblastoma (brain cancer), rhabdomyosarcoma, and Wilms' tumor. This medicine may be used for other purposes; ask your health care provider or pharmacist if you have questions. What should I tell my health care provider before I take this medicine? They need to know if you have any of these conditions: -blood disorders -gout -infection (especially chickenpox,  cold sores, or herpes) -kidney disease -liver disease -lung disease -nervous system disease like Charcot-Marie-Tooth (CMT) -recent or ongoing radiation therapy -an unusual or allergic reaction to vincristine, other chemotherapy agents, other medicines, foods, dyes, or preservatives -pregnant or trying to get pregnant -breast-feeding How should I use this medicine? This drug is given as an infusion into a vein. It is administered in a hospital or clinic by a specially trained health care professional. If you have pain, swelling, burning, or any unusual feeling around the site of your injection, tell your health care professional right away. Talk to your pediatrician regarding the use of this medicine in children. While this drug may be prescribed for selected conditions, precautions do apply. Overdosage: If you think you have taken too much of this medicine contact a poison control center or emergency room at once. NOTE: This medicine is only for you. Do not share this medicine with others. What if I miss a dose? It is important not to miss your dose. Call your doctor or health care professional if you are unable to keep an appointment. What may interact with this medicine? Do not take this medicine with any of the following medications: -itraconazole -mibefradil -voriconazole This medicine may also interact with the following medications: -cyclosporine -erythromycin -fluconazole -ketoconazole -medicines for HIV like delavirdine, efavirenz, nevirapine -medicines for seizures like ethotoin, fosphenotoin, phenytoin -medicines to increase blood counts like filgrastim, pegfilgrastim, sargramostim -other chemotherapy drugs   like cisplatin, L-asparaginase, methotrexate, mitomycin, paclitaxel -pegaspargase -vaccines -zalcitabine, ddC Talk to your doctor or health care professional before taking any of these medicines: -acetaminophen -aspirin -ibuprofen -ketoprofen -naproxen This list  may not describe all possible interactions. Give your health care provider a list of all the medicines, herbs, non-prescription drugs, or dietary supplements you use. Also tell them if you smoke, drink alcohol, or use illegal drugs. Some items may interact with your medicine. What should I watch for while using this medicine? Your condition will be monitored carefully while you are receiving this medicine. You will need important blood work done while you are taking this medicine. This drug may make you feel generally unwell. This is not uncommon, as chemotherapy can affect healthy cells as well as cancer cells. Report any side effects. Continue your course of treatment even though you feel ill unless your doctor tells you to stop. In some cases, you may be given additional medicines to help with side effects. Follow all directions for their use. Call your doctor or health care professional for advice if you get a fever, chills or sore throat, or other symptoms of a cold or flu. Do not treat yourself. Avoid taking products that contain aspirin, acetaminophen, ibuprofen, naproxen, or ketoprofen unless instructed by your doctor. These medicines may hide a fever. Do not become pregnant while taking this medicine. Women should inform their doctor if they wish to become pregnant or think they might be pregnant. There is a potential for serious side effects to an unborn child. Talk to your health care professional or pharmacist for more information. Do not breast-feed an infant while taking this medicine. Men may have a lower sperm count while taking this medicine. Talk to your doctor if you plan to father a child. What side effects may I notice from receiving this medicine? Side effects that you should report to your doctor or health care professional as soon as possible: -allergic reactions like skin rash, itching or hives, swelling of the face, lips, or tongue -breathing problems -confusion or changes in  emotions or moods -constipation -cough -mouth sores -muscle weakness -nausea and vomiting -pain, swelling, redness or irritation at the injection site -pain, tingling, numbness in the hands or feet -problems with balance, talking, walking -seizures -stomach pain -trouble passing urine or change in the amount of urine Side effects that usually do not require medical attention (report to your doctor or health care professional if they continue or are bothersome): -diarrhea -hair loss -jaw pain -loss of appetite This list may not describe all possible side effects. Call your doctor for medical advice about side effects. You may report side effects to FDA at 1-800-FDA-1088. Where should I keep my medicine? This drug is given in a hospital or clinic and will not be stored at home. NOTE: This sheet is a summary. It may not cover all possible information. If you have questions about this medicine, talk to your doctor, pharmacist, or health care provider.    2016, [employer] Standard. (2007-09-20 17:17:13)  Cyclophosphamide injection What is this medicine? CYCLOPHOSPHAMIDE (sye kloe FOSS fa mide) is a chemotherapy drug. It slows the growth of cancer cells. This medicine is used to treat many types of cancer like lymphoma, myeloma, leukemia, breast cancer, and ovarian cancer, to name a few. This medicine may be used for other purposes; ask your health care provider or pharmacist if you have questions. What should I tell my health care provider before I take this medicine? They need to   know if you have any of these conditions: -blood disorders -history of other chemotherapy -infection -kidney disease -liver disease -recent or ongoing radiation therapy -tumors in the bone marrow -an unusual or allergic reaction to cyclophosphamide, other chemotherapy, other medicines, foods, dyes, or preservatives -pregnant or trying to get pregnant -breast-feeding How should I use this  medicine? This drug is usually given as an injection into a vein or muscle or by infusion into a vein. It is administered in a hospital or clinic by a specially trained health care professional. Talk to your pediatrician regarding the use of this medicine in children. Special care may be needed. Overdosage: If you think you have taken too much of this medicine contact a poison control center or emergency room at once. NOTE: This medicine is only for you. Do not share this medicine with others. What if I miss a dose? It is important not to miss your dose. Call your doctor or health care professional if you are unable to keep an appointment. What may interact with this medicine? This medicine may interact with the following medications: -amiodarone -amphotericin B -azathioprine -certain antiviral medicines for HIV or AIDS such as protease inhibitors (e.g., indinavir, ritonavir) and zidovudine -certain blood pressure medications such as benazepril, captopril, enalapril, fosinopril, lisinopril, moexipril, monopril, perindopril, quinapril, ramipril, trandolapril -certain cancer medications such as anthracyclines (e.g., daunorubicin, doxorubicin), busulfan, cytarabine, paclitaxel, pentostatin, tamoxifen, trastuzumab -certain diuretics such as chlorothiazide, chlorthalidone, hydrochlorothiazide, indapamide, metolazone -certain medicines that treat or prevent blood clots like warfarin -certain muscle relaxants such as succinylcholine -cyclosporine -etanercept -indomethacin -medicines to increase blood counts like filgrastim, pegfilgrastim, sargramostim -medicines used as general anesthesia -metronidazole -natalizumab This list may not describe all possible interactions. Give your health care provider a list of all the medicines, herbs, non-prescription drugs, or dietary supplements you use. Also tell them if you smoke, drink alcohol, or use illegal drugs. Some items may interact with your  medicine. What should I watch for while using this medicine? Visit your doctor for checks on your progress. This drug may make you feel generally unwell. This is not uncommon, as chemotherapy can affect healthy cells as well as cancer cells. Report any side effects. Continue your course of treatment even though you feel ill unless your doctor tells you to stop. Drink water or other fluids as directed. Urinate often, even at night. In some cases, you may be given additional medicines to help with side effects. Follow all directions for their use. Call your doctor or health care professional for advice if you get a fever, chills or sore throat, or other symptoms of a cold or flu. Do not treat yourself. This drug decreases your body's ability to fight infections. Try to avoid being around people who are sick. This medicine may increase your risk to bruise or bleed. Call your doctor or health care professional if you notice any unusual bleeding. Be careful brushing and flossing your teeth or using a toothpick because you may get an infection or bleed more easily. If you have any dental work done, tell your dentist you are receiving this medicine. You may get drowsy or dizzy. Do not drive, use machinery, or do anything that needs mental alertness until you know how this medicine affects you. Do not become pregnant while taking this medicine or for 1 year after stopping it. Women should inform their doctor if they wish to become pregnant or think they might be pregnant. Men should not father a child while taking this medicine and   for 4 months after stopping it. There is a potential for serious side effects to an unborn child. Talk to your health care professional or pharmacist for more information. Do not breast-feed an infant while taking this medicine. This medicine may interfere with the ability to have a child. This medicine has caused ovarian failure in some women. This medicine has caused reduced sperm  counts in some men. You should talk with your doctor or health care professional if you are concerned about your fertility. If you are going to have surgery, tell your doctor or health care professional that you have taken this medicine. What side effects may I notice from receiving this medicine? Side effects that you should report to your doctor or health care professional as soon as possible: -allergic reactions like skin rash, itching or hives, swelling of the face, lips, or tongue -low blood counts - this medicine may decrease the number of white blood cells, red blood cells and platelets. You may be at increased risk for infections and bleeding. -signs of infection - fever or chills, cough, sore throat, pain or difficulty passing urine -signs of decreased platelets or bleeding - bruising, pinpoint red spots on the skin, black, tarry stools, blood in the urine -signs of decreased red blood cells - unusually weak or tired, fainting spells, lightheadedness -breathing problems -dark urine -dizziness -palpitations -swelling of the ankles, feet, hands -trouble passing urine or change in the amount of urine -weight gain -yellowing of the eyes or skin Side effects that usually do not require medical attention (report to your doctor or health care professional if they continue or are bothersome): -changes in nail or skin color -hair loss -missed menstrual periods -mouth sores -nausea, vomiting This list may not describe all possible side effects. Call your doctor for medical advice about side effects. You may report side effects to FDA at 1-800-FDA-1088. Where should I keep my medicine? This drug is given in a hospital or clinic and will not be stored at home. NOTE: This sheet is a summary. It may not cover all possible information. If you have questions about this medicine, talk to your doctor, pharmacist, or health care provider.    2016, Elsevier/Gold Standard. (2011-11-07  16:22:58)    

## 2015-01-29 ENCOUNTER — Telehealth: Payer: Self-pay | Admitting: *Deleted

## 2015-01-29 ENCOUNTER — Other Ambulatory Visit: Payer: Self-pay | Admitting: Vascular Surgery

## 2015-01-29 ENCOUNTER — Other Ambulatory Visit: Payer: Self-pay | Admitting: Internal Medicine

## 2015-01-29 DIAGNOSIS — C8330 Diffuse large B-cell lymphoma, unspecified site: Secondary | ICD-10-CM

## 2015-01-30 ENCOUNTER — Telehealth: Payer: Self-pay | Admitting: *Deleted

## 2015-01-30 ENCOUNTER — Inpatient Hospital Stay: Payer: 59

## 2015-01-30 ENCOUNTER — Ambulatory Visit
Admission: RE | Admit: 2015-01-30 | Discharge: 2015-01-30 | Disposition: A | Discharge status: Home / Self Care | Payer: 59 | Source: Ambulatory Visit | Attending: Internal Medicine | Admitting: Internal Medicine

## 2015-01-30 DIAGNOSIS — C8330 Diffuse large B-cell lymphoma, unspecified site: Secondary | ICD-10-CM | POA: Insufficient documentation

## 2015-01-30 MED ORDER — LIDOCAINE-PRILOCAINE 2.5-2.5 % EX CREA: TOPICAL_CREAM | CUTANEOUS | Status: DC

## 2015-01-30 NOTE — Progress Notes (Signed)
*  PRELIMINARY RESULTS* Echocardiogram 2D Echocardiogram has been performed.  Laqueta Jean Hege 01/30/2015, 11:26 AM

## 2015-01-30 NOTE — Telephone Encounter (Signed)
electronically sent rx for emla cream. Pt told on my call to him today to talk about what he needs with fmla form. Intermittent leave with poss.flare up due to chemo.

## 2015-01-31 ENCOUNTER — Encounter: Payer: Self-pay | Admitting: *Deleted

## 2015-01-31 ENCOUNTER — Encounter
Admission: RE | Disposition: A | Discharge status: Home / Self Care | Payer: Self-pay | Source: Ambulatory Visit | Attending: Vascular Surgery

## 2015-01-31 ENCOUNTER — Ambulatory Visit
Admission: RE | Admit: 2015-01-31 | Discharge: 2015-01-31 | Disposition: A | Discharge status: Home / Self Care | Payer: 59 | Source: Ambulatory Visit | Attending: Vascular Surgery | Admitting: Vascular Surgery

## 2015-01-31 ENCOUNTER — Inpatient Hospital Stay: Payer: 59

## 2015-01-31 ENCOUNTER — Telehealth: Payer: Self-pay | Admitting: *Deleted

## 2015-01-31 DIAGNOSIS — Z79899 Other long term (current) drug therapy: Secondary | ICD-10-CM | POA: Diagnosis not present

## 2015-01-31 DIAGNOSIS — M1990 Unspecified osteoarthritis, unspecified site: Secondary | ICD-10-CM | POA: Diagnosis not present

## 2015-01-31 DIAGNOSIS — C8590 Non-Hodgkin lymphoma, unspecified, unspecified site: Secondary | ICD-10-CM | POA: Diagnosis present

## 2015-01-31 DIAGNOSIS — Z8673 Personal history of transient ischemic attack (TIA), and cerebral infarction without residual deficits: Secondary | ICD-10-CM | POA: Insufficient documentation

## 2015-01-31 DIAGNOSIS — K219 Gastro-esophageal reflux disease without esophagitis: Secondary | ICD-10-CM | POA: Insufficient documentation

## 2015-01-31 DIAGNOSIS — Z87891 Personal history of nicotine dependence: Secondary | ICD-10-CM | POA: Insufficient documentation

## 2015-01-31 DIAGNOSIS — I4891 Unspecified atrial fibrillation: Secondary | ICD-10-CM | POA: Diagnosis not present

## 2015-01-31 DIAGNOSIS — I10 Essential (primary) hypertension: Secondary | ICD-10-CM | POA: Diagnosis not present

## 2015-01-31 HISTORY — PX: PERIPHERAL VASCULAR CATHETERIZATION: SHX172C

## 2015-01-31 HISTORY — DX: Fibromyalgia: M79.7

## 2015-01-31 SURGERY — PORTA CATH INSERTION
Anesthesia: Moderate Sedation

## 2015-01-31 MED ORDER — FENTANYL CITRATE (PF) 100 MCG/2ML IJ SOLN
INTRAMUSCULAR | Status: AC
Start: 2015-01-31 — End: 2015-01-31
  Filled 2015-01-31: qty 2

## 2015-01-31 MED ORDER — HYDROMORPHONE HCL 1 MG/ML IJ SOLN: 1.0000 mg | Freq: Once | INTRAMUSCULAR | Status: DC

## 2015-01-31 MED ORDER — ONDANSETRON HCL 4 MG/2ML IJ SOLN: 4.0000 mg | Freq: Four times a day (QID) | INTRAMUSCULAR | Status: DC | PRN

## 2015-01-31 MED ORDER — MIDAZOLAM HCL 2 MG/2ML IJ SOLN
INTRAMUSCULAR | Status: DC | PRN
  Administered 2015-01-31: 1 mg via INTRAVENOUS
  Administered 2015-01-31: 3 mg via INTRAVENOUS

## 2015-01-31 MED ORDER — MIDAZOLAM HCL 5 MG/5ML IJ SOLN
INTRAMUSCULAR | Status: AC
  Filled 2015-01-31: qty 5

## 2015-01-31 MED ORDER — LIDOCAINE-EPINEPHRINE (PF) 1 %-1:200000 IJ SOLN
INTRAMUSCULAR | Status: AC
  Filled 2015-01-31: qty 30

## 2015-01-31 MED ORDER — FENTANYL CITRATE (PF) 100 MCG/2ML IJ SOLN
INTRAMUSCULAR | Status: DC | PRN
  Administered 2015-01-31: 50 ug via INTRAVENOUS
  Administered 2015-01-31: 100 ug via INTRAVENOUS

## 2015-01-31 MED ORDER — LIDOCAINE-EPINEPHRINE (PF) 1 %-1:200000 IJ SOLN
INTRAMUSCULAR | Status: DC | PRN
  Administered 2015-01-31: 20 mL via INTRADERMAL

## 2015-01-31 MED ORDER — CLINDAMYCIN PHOSPHATE 300 MG/50ML IV SOLN
INTRAVENOUS | Status: AC
  Filled 2015-01-31: qty 50

## 2015-01-31 MED ORDER — CLINDAMYCIN PHOSPHATE 300 MG/50ML IV SOLN
300.0000 mg | Freq: Once | INTRAVENOUS | Status: AC
  Administered 2015-01-31: 300 mg via INTRAVENOUS

## 2015-01-31 MED ORDER — HEPARIN (PORCINE) IN NACL 2-0.9 UNIT/ML-% IJ SOLN
INTRAMUSCULAR | Status: AC
Start: 2015-01-31 — End: 2015-01-31
  Filled 2015-01-31: qty 500

## 2015-01-31 MED ORDER — SODIUM CHLORIDE 0.9 % IV SOLN
INTRAVENOUS | Status: DC
  Administered 2015-01-31 (×2): via INTRAVENOUS

## 2015-01-31 MED ORDER — FENTANYL CITRATE (PF) 100 MCG/2ML IJ SOLN
INTRAMUSCULAR | Status: AC
  Filled 2015-01-31: qty 2

## 2015-01-31 SURGICAL SUPPLY — 9 items
BAG DECANTER STRL (MISCELLANEOUS) ×3 IMPLANT
DRAPE INCISE IOBAN 66X45 STRL (DRAPES) ×3 IMPLANT
KIT PORT POWER 8FR ISP CVUE (Catheter) ×3 IMPLANT
PACK ANGIOGRAPHY (CUSTOM PROCEDURE TRAY) ×3 IMPLANT
PREP CHG 10.5 TEAL (MISCELLANEOUS) ×3 IMPLANT
SUT MNCRL AB 4-0 PS2 18 (SUTURE) ×3 IMPLANT
SUT PROLENE 0 CT 1 30 (SUTURE) ×3 IMPLANT
SUTURE VIC 3-0 (SUTURE) ×3 IMPLANT
TOWEL OR 17X26 4PK STRL BLUE (TOWEL DISPOSABLE) ×3 IMPLANT

## 2015-01-31 NOTE — Telephone Encounter (Signed)
Spoke to pt about his FMLA.  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  He wanted to make sure there is no restrictions for him.  I told him after speaking to Dr. Rudean Hitt that he could lift his paper rems but he should not hold them at chest level that could hurt his port.  He states that the heavier stuff he gets his asst to lift and he usually lifts about 5-10 lbs and he holds it at his waist and belly and never at chest level.  I wrote him out for one day every 21 days for chemo.  He will come and get his weekly labs in the morning when he is off work anyway and I would enter in flare up days for when he may have side effects that would prevent him from working and he is good with that.  He will come and pick up form today and it is ready.  He will drop off living will because we do not have copy of that.

## 2015-01-31 NOTE — Op Note (Addendum)
OPERATIVE NOTE   PROCEDURE: 1. Placement of a right IJ Infuse-a-Port  PRE-OPERATIVE DIAGNOSIS: Lymphoma  POST-OPERATIVE DIAGNOSIS: Same  SURGEON: Katha Cabal M.D.  ANESTHESIA: Conscious sedation was administered using a, notation of intravenous Versed plus fentanyl combined with local infiltration with 1% lidocaine with epinephrine. Continuous ECG pulse oximetry and cardiopulmonary monitoring was performed throughout the entire procedure by the interventional radiology nursing total sedation time was approximately 35 minutes timeout was called at 12:58 PM in the case and that at approximately 1:25 PM.  ESTIMATED BLOOD LOSS: Minimal   FINDING(S): 1.  Patent vein  SPECIMEN(S): None  INDICATIONS:   Mason Houston is a 65 y.o. male who presents with lymphoma and will undergo treatment therefore needs appropriate intravenous access. Risks and benefits of been reviewed patient wishes to proceed with port placement.  DESCRIPTION: After obtaining full informed written consent, the patient was brought back to the special procedure suite and placed in the supine position. The patient's right neck and chest wall are prepped and draped in sterile fashion. Appropriate timeout was called.  Ultrasound is placed in a sterile sleeve, ultrasound is utilized to avoid vascular injury as well as secondary to lack of appropriate landmarks. The right internal jugular vein is identified. It is echolucent and homogeneous as well as easily compressible indicating patency. 1% lidocaine is infiltrated into the soft tissue at the base of the neck as well as on the chest wall.  Under direct ultrasound visualization Seldinger needle is inserted into the right internal jugular vein. J-wire is advanced under fluoroscopic guidance. A small counterincision was created at the wire insertion site. A transverse incision is created 2 fingerbreadths below the scapula and a pocket is fashioned using both blunt and sharp  dissection. The pocket is tested for appropriate size with the hub of the Infuse-a-Port. The tunneling device is then used to pull the intravascular portion of the catheter from the pocket to the neck counterincision.  Dilator and peel-away sheath were then inserted over the wire and the wire is removed. Catheter is then advanced into the venous system without difficulty. Peel-away sheath was then removed.  Catheter is then positioned under fluoroscopic guidance at the atrial caval junction. It is then transected connected to the hub and the hope is slipped into the subcutaneous pocket on the chest wall. The hub was then accessed percutaneously and aspirates easily and flushes well and is flushed with 30 cc of heparinized saline. The pocket incision is then closed in layers using interrupted 3-0 Vicryl for the subcutaneous tissues and 4-0 Monocryl subcuticular for skin closure. Dermabond is applied. The neck counterincision was closed with 4-0 Monocryl subcuticular and Dermabond as well.  The patient tolerated the procedure well and there were no immediate complications.  COMPLICATIONS: None  CONDITION: Unchanged  Katha Cabal M.D.  vein and vascular Office: [phone]   01/31/2015, 1:28 PM

## 2015-01-31 NOTE — Discharge Instructions (Signed)
Implanted Port Insertion An implanted port is a central line that has a round shape and is placed under the skin. It is used as a long-term IV access for:   Medicines, such as chemotherapy.   Fluids.   Liquid nutrition, such as total parenteral nutrition (TPN).   Blood samples.  LET YOUR HEALTH CARE PROVIDER KNOW ABOUT:  Allergies to food or medicine.   Medicines taken, including vitamins, herbs, eye drops, creams, and over-the-counter medicines.   Any allergies to heparin.  Use of steroids (by mouth or creams).   Previous problems with anesthetics or numbing medicines.   History of bleeding problems or blood clots.   Previous surgery.   Other health problems, including diabetes and kidney problems.   Possibility of pregnancy, if this applies. RISKS AND COMPLICATIONS Generally, this is a safe procedure. However, as with any procedure, problems can occur. Possible problems include:  Damage to the blood vessel, bruising, or bleeding at the puncture site.   Infection.  Blood clot in the vessel that the port is in.  Breakdown of the skin over your port.  Very rarely a person may develop a condition called a pneumothorax, a collection of air in the chest that may cause one of the lungs to collapse. The placement of these catheters with the appropriate imaging guidance significantly decreases the risk of a pneumothorax.  BEFORE THE PROCEDURE   Your health care provider may want you to have blood tests. These tests can help tell how well your kidneys and liver are working. They can also show how well your blood clots.   If you take blood thinners (anticoagulant medicines), ask your health care provider when you should stop taking them.   Make arrangements for someone to drive you home. This is necessary if you have been sedated for your procedure.  PROCEDURE  Port insertion usually takes about 30-45 minutes.   An IV needle will be inserted in your arm.  Medicine for pain and medicine to help relax you (sedative) will flow directly into your body through this needle.   You will lie on an exam table, and you will be connected to monitors to keep track of your heart rate, blood pressure, and breathing throughout the procedure.  An oxygen monitoring device may be attached to your finger. Oxygen will be given.   Everything will be kept as germ free (sterile) as possible during the procedure. The skin near the point of the incision will be cleansed with antiseptic, and the area will be draped with sterile towels. The skin and deeper tissues over the port area will be made numb with a local anesthetic.  Two small cuts (incisions) will be made in the skin to insert the port. One will be made in the neck to obtain access to the vein where the catheter will lie.   Because the port reservoir will be placed under the skin, a small skin incision will be made in the upper chest, and a small pocket for the port will be made under the skin. The catheter that will be connected to the port tunnels to a large central vein in the chest. A small, raised area will remain on your body at the site of the reservoir when the procedure is complete.  The port placement will be done under imaging guidance to ensure the proper placement.  The reservoir has a silicone covering that can be punctured with a special needle.   The port will be flushed with normal   saline, and blood will be drawn to make sure it is working properly.  There will be nothing remaining outside the skin when the procedure is finished.   Incisions will be held together by stitches, surgical glue, or a special tape. AFTER THE PROCEDURE  You will stay in a recovery area until the anesthesia has worn off. Your blood pressure and pulse will be checked.  A final chest X-ray will be taken to check the placement of the port and to ensure that there is no injury to your lung.   This information is  not intended to replace advice given to you by your health care provider. Make sure you discuss any questions you have with your health care provider.   Document Released: 10/13/2012 Document Revised: 01/13/2014 Document Reviewed: 10/13/2012 Elsevier Interactive Patient Education 2016 Elsevier Inc.  

## 2015-01-31 NOTE — Progress Notes (Signed)
Pt clinically stable in recovery, with wife present, right site nl appearance. Vss. Discharge teaching given with questions answered, port booklet and cards given to wife,  Ready for dischanges.

## 2015-02-02 ENCOUNTER — Telehealth: Payer: Self-pay

## 2015-02-02 ENCOUNTER — Inpatient Hospital Stay: Payer: 59

## 2015-02-02 ENCOUNTER — Encounter: Payer: Self-pay | Admitting: Vascular Surgery

## 2015-02-02 ENCOUNTER — Telehealth: Payer: Self-pay | Admitting: *Deleted

## 2015-02-02 ENCOUNTER — Other Ambulatory Visit: Payer: Self-pay | Admitting: Internal Medicine

## 2015-02-02 VITALS — BP 119/64 | HR 64 | Temp 97.9°F | Resp 18

## 2015-02-02 DIAGNOSIS — C8330 Diffuse large B-cell lymphoma, unspecified site: Secondary | ICD-10-CM

## 2015-02-02 MED ORDER — SODIUM CHLORIDE 0.9 % IV SOLN
Freq: Once | INTRAVENOUS | Status: AC
  Administered 2015-02-02: 09:00:00 via INTRAVENOUS
  Filled 2015-02-02: qty 1000

## 2015-02-02 MED ORDER — PALONOSETRON HCL INJECTION 0.25 MG/5ML
0.2500 mg | Freq: Once | INTRAVENOUS | Status: AC
  Administered 2015-02-02: 0.25 mg via INTRAVENOUS
  Filled 2015-02-02: qty 5

## 2015-02-02 MED ORDER — VINCRISTINE SULFATE CHEMO INJECTION 1 MG/ML
2.0000 mg | Freq: Once | INTRAVENOUS | Status: AC
  Administered 2015-02-02: 2 mg via INTRAVENOUS
  Filled 2015-02-02: qty 2

## 2015-02-02 MED ORDER — PREDNISONE 20 MG PO TABS: 80.0000 mg | ORAL_TABLET | Freq: Every day | ORAL | Status: DC

## 2015-02-02 MED ORDER — SODIUM CHLORIDE 0.9 % IV SOLN
10.0000 mg | Freq: Once | INTRAVENOUS | Status: AC
  Administered 2015-02-02: 10 mg via INTRAVENOUS
  Filled 2015-02-02: qty 1

## 2015-02-02 MED ORDER — PROCHLORPERAZINE MALEATE 10 MG PO TABS: 10.0000 mg | ORAL_TABLET | Freq: Four times a day (QID) | ORAL | Status: DC | PRN

## 2015-02-02 MED ORDER — SODIUM CHLORIDE 0.9 % IV SOLN
750.0000 mg/m2 | Freq: Once | INTRAVENOUS | Status: AC
  Administered 2015-02-02: 1720 mg via INTRAVENOUS
  Filled 2015-02-02: qty 86

## 2015-02-02 MED ORDER — DOXORUBICIN HCL CHEMO IV INJECTION 2 MG/ML
50.0000 mg/m2 | Freq: Once | INTRAVENOUS | Status: AC
  Administered 2015-02-02: 116 mg via INTRAVENOUS
  Filled 2015-02-02: qty 58

## 2015-02-02 MED ORDER — HEPARIN SOD (PORK) LOCK FLUSH 100 UNIT/ML IV SOLN
500.0000 [IU] | Freq: Once | INTRAVENOUS | Status: AC | PRN
  Administered 2015-02-02: 500 [IU]
  Filled 2015-02-02: qty 5

## 2015-02-02 MED ORDER — ONDANSETRON HCL 8 MG PO TABS: 8.0000 mg | ORAL_TABLET | Freq: Three times a day (TID) | ORAL | Status: DC

## 2015-02-02 MED ORDER — ACETAMINOPHEN 325 MG PO TABS
650.0000 mg | ORAL_TABLET | Freq: Once | ORAL | Status: AC
  Administered 2015-02-02: 650 mg via ORAL
  Filled 2015-02-02: qty 2

## 2015-02-02 MED ORDER — DIPHENHYDRAMINE HCL 25 MG PO CAPS
50.0000 mg | ORAL_CAPSULE | Freq: Once | ORAL | Status: AC
  Administered 2015-02-02: 50 mg via ORAL
  Filled 2015-02-02: qty 2

## 2015-02-02 MED ORDER — PEGFILGRASTIM 6 MG/0.6ML ~~LOC~~ PSKT
6.0000 mg | PREFILLED_SYRINGE | Freq: Once | SUBCUTANEOUS | Status: AC
  Administered 2015-02-02: 6 mg via SUBCUTANEOUS
  Filled 2015-02-02: qty 0.6

## 2015-02-02 MED ORDER — SODIUM CHLORIDE 0.9 % IJ SOLN
10.0000 mL | INTRAMUSCULAR | Status: DC | PRN
  Administered 2015-02-02: 10 mL
  Filled 2015-02-02: qty 10

## 2015-02-02 MED ORDER — SODIUM CHLORIDE 0.9 % IV SOLN
375.0000 mg/m2 | Freq: Once | INTRAVENOUS | Status: AC
  Administered 2015-02-02: 900 mg via INTRAVENOUS
  Filled 2015-02-02: qty 80

## 2015-02-02 NOTE — Progress Notes (Signed)
Patient had labs on 01-25-15. Per MD, Dr. Rudean Hitt, order: no new lab orders needed today. Proceed with chemotherapy treatment.

## 2015-02-02 NOTE — Telephone Encounter (Signed)
Ok will try to set up ERCP before 2/17. Thanks.

## 2015-02-02 NOTE — Telephone Encounter (Signed)
Dr Candace Cruise is wanting to do ERCP and removed Bili Tube and wants to be sure the dates are cohesive with his chemo treatments they are planning for his procedure on 2/17, will this work? Please advise

## 2015-02-02 NOTE — Telephone Encounter (Signed)
This is the day when the patient receives his second cycle of treatment, so if his stent could be removed any day of that week prior to February 17, that probably would work better.Roxana Hires, MD

## 2015-02-02 NOTE — Progress Notes (Signed)
Patient receiving RCHOP therapy. Doxorubicin dose is 50mg /m2, TBili is 1.4. Spoke with Dr Rudean Hitt about possible dose reduction. He stated Bili is elevated due to tumor mass and he would like to continue current dosing at this time.

## 2015-02-02 NOTE — Telephone Encounter (Signed)
Received cardiac clearance request from [facility] for ERCP w/biliary stent removal on 2/17 Placed in MD basket

## 2015-02-05 ENCOUNTER — Telehealth: Payer: Self-pay | Admitting: *Deleted

## 2015-02-05 ENCOUNTER — Telehealth: Payer: Self-pay

## 2015-02-05 ENCOUNTER — Other Ambulatory Visit: Payer: Self-pay | Admitting: *Deleted

## 2015-02-05 MED ORDER — LOSARTAN POTASSIUM 100 MG PO TABS: 100.0000 mg | ORAL_TABLET | Freq: Every day | ORAL | Status: DC

## 2015-02-05 NOTE — Telephone Encounter (Signed)
°*  STAT* If patient is at the pharmacy, call can be transferred to refill team.   1. Which medications need to be refilled? (please list name of each medication and dose if known) Losartan 100 mg   2. Which pharmacy/location (including street and city if local pharmacy) is medication to be sent to?  3. Do they need a 30 day or 90 day supply? 90 day   Pt only has enough for 4 days. Please send soon.

## 2015-02-05 NOTE — Telephone Encounter (Signed)
Requested Prescriptions  ? ?Signed Prescriptions Disp Refills  ? losartan (COZAAR) 100 MG tablet 90 tablet 3  ?  Sig: Take 1 tablet (100 mg total) by mouth daily.  ?  Authorizing Provider: ARIDA, MUHAMMAD A  ?  Ordering User: Ephraim Reichel C  ? ? ?

## 2015-02-05 NOTE — Telephone Encounter (Signed)
Faxed clearance to [facility], Dr. Candace Cruise, [phone]  S/w pt wife, Theune, to inform pt per Dr. Tyrell Antonio instructions, do not take xarelto 2 days prior to procedure.  Chacko verbalized understanding and will notify pt as he is not at home at this time.

## 2015-02-08 ENCOUNTER — Other Ambulatory Visit: Payer: Self-pay

## 2015-02-08 ENCOUNTER — Ambulatory Visit: Payer: 59

## 2015-02-08 DIAGNOSIS — C8330 Diffuse large B-cell lymphoma, unspecified site: Secondary | ICD-10-CM

## 2015-02-09 ENCOUNTER — Other Ambulatory Visit: Payer: Self-pay | Admitting: *Deleted

## 2015-02-09 ENCOUNTER — Inpatient Hospital Stay: Payer: 59 | Attending: Internal Medicine

## 2015-02-09 DIAGNOSIS — C8330 Diffuse large B-cell lymphoma, unspecified site: Secondary | ICD-10-CM | POA: Insufficient documentation

## 2015-02-09 DIAGNOSIS — I4891 Unspecified atrial fibrillation: Secondary | ICD-10-CM | POA: Insufficient documentation

## 2015-02-09 DIAGNOSIS — M797 Fibromyalgia: Secondary | ICD-10-CM | POA: Insufficient documentation

## 2015-02-09 DIAGNOSIS — K219 Gastro-esophageal reflux disease without esophagitis: Secondary | ICD-10-CM | POA: Diagnosis not present

## 2015-02-09 DIAGNOSIS — I10 Essential (primary) hypertension: Secondary | ICD-10-CM | POA: Insufficient documentation

## 2015-02-09 DIAGNOSIS — Z79899 Other long term (current) drug therapy: Secondary | ICD-10-CM | POA: Insufficient documentation

## 2015-02-09 DIAGNOSIS — Z8673 Personal history of transient ischemic attack (TIA), and cerebral infarction without residual deficits: Secondary | ICD-10-CM | POA: Insufficient documentation

## 2015-02-09 DIAGNOSIS — Z87891 Personal history of nicotine dependence: Secondary | ICD-10-CM | POA: Insufficient documentation

## 2015-02-09 DIAGNOSIS — Z7901 Long term (current) use of anticoagulants: Secondary | ICD-10-CM | POA: Diagnosis not present

## 2015-02-09 DIAGNOSIS — C8333 Diffuse large B-cell lymphoma, intra-abdominal lymph nodes: Secondary | ICD-10-CM | POA: Diagnosis present

## 2015-02-09 LAB — CBC WITH DIFFERENTIAL/PLATELET
Basophils Absolute: 0 10*3/uL (ref 0–0.1)
Basophils Relative: 1 %
Eosinophils Absolute: 0.2 10*3/uL (ref 0–0.7)
Eosinophils Relative: 10 %
HCT: 34.8 % — ABNORMAL LOW (ref 40.0–52.0)
Hemoglobin: 12 g/dL — ABNORMAL LOW (ref 13.0–18.0)
Lymphocytes Relative: 49 %
Lymphs Abs: 1 10*3/uL (ref 1.0–3.6)
MCH: 29 pg (ref 26.0–34.0)
MCHC: 34.6 g/dL (ref 32.0–36.0)
MCV: 83.9 fL (ref 80.0–100.0)
Monocytes Absolute: 0.3 10*3/uL (ref 0.2–1.0)
Monocytes Relative: 13 %
Neutro Abs: 0.6 10*3/uL — ABNORMAL LOW (ref 1.4–6.5)
Neutrophils Relative %: 27 %
Platelets: 170 10*3/uL (ref 150–440)
RBC: 4.15 MIL/uL — ABNORMAL LOW (ref 4.40–5.90)
RDW: 15.2 % — ABNORMAL HIGH (ref 11.5–14.5)
WBC: 2.1 10*3/uL — ABNORMAL LOW (ref 3.8–10.6)

## 2015-02-09 LAB — COMPREHENSIVE METABOLIC PANEL
ALT: 24 U/L (ref 17–63)
AST: 18 U/L (ref 15–41)
Albumin: 3.8 g/dL (ref 3.5–5.0)
Alkaline Phosphatase: 96 U/L (ref 38–126)
Anion gap: 5 (ref 5–15)
BUN: 19 mg/dL (ref 6–20)
CO2: 30 mmol/L (ref 22–32)
Calcium: 8.5 mg/dL — ABNORMAL LOW (ref 8.9–10.3)
Chloride: 99 mmol/L — ABNORMAL LOW (ref 101–111)
Creatinine, Ser: 1.09 mg/dL (ref 0.61–1.24)
GFR calc Af Amer: >60 mL/min (ref >60)
GFR calc non Af Amer: >60 mL/min (ref >60)
Glucose, Bld: 111 mg/dL — ABNORMAL HIGH (ref 65–99)
Potassium: 3.9 mmol/L (ref 3.5–5.1)
Sodium: 134 mmol/L — ABNORMAL LOW (ref 135–145)
Total Bilirubin: 1 mg/dL (ref 0.3–1.2)
Total Protein: 6.9 g/dL (ref 6.5–8.1)

## 2015-02-12 LAB — SLIDE CONSULT, PATHOLOGY ARMC

## 2015-02-16 ENCOUNTER — Inpatient Hospital Stay: Payer: 59

## 2015-02-16 ENCOUNTER — Other Ambulatory Visit: Payer: Self-pay | Admitting: *Deleted

## 2015-02-16 DIAGNOSIS — C8330 Diffuse large B-cell lymphoma, unspecified site: Secondary | ICD-10-CM

## 2015-02-16 DIAGNOSIS — C8333 Diffuse large B-cell lymphoma, intra-abdominal lymph nodes: Secondary | ICD-10-CM | POA: Diagnosis not present

## 2015-02-16 LAB — CBC WITH DIFFERENTIAL/PLATELET
Basophils Absolute: 0.1 10*3/uL (ref 0–0.1)
Basophils Relative: 1 %
Eosinophils Absolute: 0.1 10*3/uL (ref 0–0.7)
Eosinophils Relative: 1 %
HCT: 36.5 % — ABNORMAL LOW (ref 40.0–52.0)
Hemoglobin: 12.5 g/dL — ABNORMAL LOW (ref 13.0–18.0)
Lymphocytes Relative: 13 %
Lymphs Abs: 1.4 10*3/uL (ref 1.0–3.6)
MCH: 29.1 pg (ref 26.0–34.0)
MCHC: 34.3 g/dL (ref 32.0–36.0)
MCV: 85 fL (ref 80.0–100.0)
Monocytes Absolute: 0.9 10*3/uL (ref 0.2–1.0)
Monocytes Relative: 8 %
Neutro Abs: 8.5 10*3/uL — ABNORMAL HIGH (ref 1.4–6.5)
Neutrophils Relative %: 77 %
Platelets: 200 10*3/uL (ref 150–440)
RBC: 4.29 MIL/uL — ABNORMAL LOW (ref 4.40–5.90)
RDW: 15.8 % — ABNORMAL HIGH (ref 11.5–14.5)
WBC: 11 10*3/uL — ABNORMAL HIGH (ref 3.8–10.6)

## 2015-02-16 LAB — BASIC METABOLIC PANEL
Anion gap: 5 (ref 5–15)
BUN: 16 mg/dL (ref 6–20)
CO2: 31 mmol/L (ref 22–32)
Calcium: 9.1 mg/dL (ref 8.9–10.3)
Chloride: 103 mmol/L (ref 101–111)
Creatinine, Ser: 1.17 mg/dL (ref 0.61–1.24)
GFR calc Af Amer: >60 mL/min (ref >60)
GFR calc non Af Amer: >60 mL/min (ref >60)
Glucose, Bld: 136 mg/dL — ABNORMAL HIGH (ref 65–99)
Potassium: 4.5 mmol/L (ref 3.5–5.1)
Sodium: 139 mmol/L (ref 135–145)

## 2015-02-20 ENCOUNTER — Other Ambulatory Visit: Payer: Self-pay | Admitting: *Deleted

## 2015-02-20 DIAGNOSIS — M797 Fibromyalgia: Secondary | ICD-10-CM | POA: Diagnosis not present

## 2015-02-20 DIAGNOSIS — T85898A Other specified complication of other internal prosthetic devices, implants and grafts, initial encounter: Secondary | ICD-10-CM | POA: Diagnosis not present

## 2015-02-20 DIAGNOSIS — Z7901 Long term (current) use of anticoagulants: Secondary | ICD-10-CM | POA: Diagnosis not present

## 2015-02-20 DIAGNOSIS — Z8673 Personal history of transient ischemic attack (TIA), and cerebral infarction without residual deficits: Secondary | ICD-10-CM | POA: Diagnosis not present

## 2015-02-20 DIAGNOSIS — Z8669 Personal history of other diseases of the nervous system and sense organs: Secondary | ICD-10-CM | POA: Diagnosis not present

## 2015-02-20 DIAGNOSIS — C8330 Diffuse large B-cell lymphoma, unspecified site: Secondary | ICD-10-CM

## 2015-02-20 DIAGNOSIS — I4891 Unspecified atrial fibrillation: Secondary | ICD-10-CM | POA: Diagnosis not present

## 2015-02-20 DIAGNOSIS — M1990 Unspecified osteoarthritis, unspecified site: Secondary | ICD-10-CM | POA: Diagnosis not present

## 2015-02-20 DIAGNOSIS — Z8249 Family history of ischemic heart disease and other diseases of the circulatory system: Secondary | ICD-10-CM | POA: Diagnosis not present

## 2015-02-20 DIAGNOSIS — Z87891 Personal history of nicotine dependence: Secondary | ICD-10-CM | POA: Diagnosis not present

## 2015-02-20 DIAGNOSIS — Z8572 Personal history of non-Hodgkin lymphomas: Secondary | ICD-10-CM | POA: Diagnosis present

## 2015-02-20 DIAGNOSIS — K219 Gastro-esophageal reflux disease without esophagitis: Secondary | ICD-10-CM | POA: Diagnosis not present

## 2015-02-20 DIAGNOSIS — Z79899 Other long term (current) drug therapy: Secondary | ICD-10-CM | POA: Diagnosis not present

## 2015-02-20 DIAGNOSIS — X58XXXA Exposure to other specified factors, initial encounter: Secondary | ICD-10-CM | POA: Diagnosis not present

## 2015-02-20 DIAGNOSIS — I10 Essential (primary) hypertension: Secondary | ICD-10-CM | POA: Diagnosis not present

## 2015-02-21 ENCOUNTER — Ambulatory Visit: Payer: 59 | Admitting: Anesthesiology

## 2015-02-21 ENCOUNTER — Ambulatory Visit
Admission: RE | Admit: 2015-02-21 | Discharge: 2015-02-21 | Disposition: A | Discharge status: Home / Self Care | Payer: 59 | Source: Ambulatory Visit | Attending: Gastroenterology | Admitting: Gastroenterology

## 2015-02-21 ENCOUNTER — Ambulatory Visit: Payer: 59

## 2015-02-21 ENCOUNTER — Encounter
Admission: RE | Disposition: A | Discharge status: Home / Self Care | Payer: Self-pay | Source: Ambulatory Visit | Attending: Gastroenterology

## 2015-02-21 ENCOUNTER — Encounter: Payer: Self-pay | Admitting: Anesthesiology

## 2015-02-21 DIAGNOSIS — Z8669 Personal history of other diseases of the nervous system and sense organs: Secondary | ICD-10-CM | POA: Insufficient documentation

## 2015-02-21 DIAGNOSIS — K219 Gastro-esophageal reflux disease without esophagitis: Secondary | ICD-10-CM | POA: Insufficient documentation

## 2015-02-21 DIAGNOSIS — Z7901 Long term (current) use of anticoagulants: Secondary | ICD-10-CM | POA: Insufficient documentation

## 2015-02-21 DIAGNOSIS — Z8572 Personal history of non-Hodgkin lymphomas: Secondary | ICD-10-CM | POA: Insufficient documentation

## 2015-02-21 DIAGNOSIS — I10 Essential (primary) hypertension: Secondary | ICD-10-CM | POA: Insufficient documentation

## 2015-02-21 DIAGNOSIS — Z87891 Personal history of nicotine dependence: Secondary | ICD-10-CM | POA: Insufficient documentation

## 2015-02-21 DIAGNOSIS — M797 Fibromyalgia: Secondary | ICD-10-CM | POA: Insufficient documentation

## 2015-02-21 DIAGNOSIS — Z8673 Personal history of transient ischemic attack (TIA), and cerebral infarction without residual deficits: Secondary | ICD-10-CM | POA: Insufficient documentation

## 2015-02-21 DIAGNOSIS — T85898A Other specified complication of other internal prosthetic devices, implants and grafts, initial encounter: Secondary | ICD-10-CM | POA: Diagnosis not present

## 2015-02-21 DIAGNOSIS — I4891 Unspecified atrial fibrillation: Secondary | ICD-10-CM | POA: Insufficient documentation

## 2015-02-21 DIAGNOSIS — X58XXXA Exposure to other specified factors, initial encounter: Secondary | ICD-10-CM | POA: Insufficient documentation

## 2015-02-21 DIAGNOSIS — M1990 Unspecified osteoarthritis, unspecified site: Secondary | ICD-10-CM | POA: Insufficient documentation

## 2015-02-21 DIAGNOSIS — Z8249 Family history of ischemic heart disease and other diseases of the circulatory system: Secondary | ICD-10-CM | POA: Insufficient documentation

## 2015-02-21 DIAGNOSIS — Z79899 Other long term (current) drug therapy: Secondary | ICD-10-CM | POA: Insufficient documentation

## 2015-02-21 HISTORY — PX: ERCP: SHX5425

## 2015-02-21 SURGERY — ERCP, WITH INTERVENTION IF INDICATED
Anesthesia: General

## 2015-02-21 MED ORDER — SUGAMMADEX SODIUM 200 MG/2ML IV SOLN
INTRAVENOUS | Status: DC | PRN
  Administered 2015-02-21: 200 mg via INTRAVENOUS

## 2015-02-21 MED ORDER — LIDOCAINE HCL (CARDIAC) 20 MG/ML IV SOLN
INTRAVENOUS | Status: DC | PRN
  Administered 2015-02-21: 100 mg via INTRAVENOUS

## 2015-02-21 MED ORDER — SODIUM CHLORIDE 0.9 % IV SOLN
INTRAVENOUS | Status: DC
  Administered 2015-02-21: 14:00:00 via INTRAVENOUS

## 2015-02-21 MED ORDER — SODIUM CHLORIDE 0.9 % IV SOLN
INTRAVENOUS | Status: DC
  Administered 2015-02-21: 1000 mL via INTRAVENOUS

## 2015-02-21 MED ORDER — SUCCINYLCHOLINE CHLORIDE 20 MG/ML IJ SOLN
INTRAMUSCULAR | Status: DC | PRN
  Administered 2015-02-21: 100 mg via INTRAVENOUS

## 2015-02-21 MED ORDER — DEXAMETHASONE SODIUM PHOSPHATE 10 MG/ML IJ SOLN
INTRAMUSCULAR | Status: DC | PRN
  Administered 2015-02-21: 10 mg via INTRAVENOUS

## 2015-02-21 MED ORDER — LEVOFLOXACIN IN D5W 250 MG/50ML IV SOLN
250.0000 mg | Freq: Once | INTRAVENOUS | Status: AC
  Administered 2015-02-21: 250 mg via INTRAVENOUS
  Filled 2015-02-21: qty 50

## 2015-02-21 MED ORDER — ROCURONIUM BROMIDE 100 MG/10ML IV SOLN
INTRAVENOUS | Status: DC | PRN
  Administered 2015-02-21: 10 mg via INTRAVENOUS
  Administered 2015-02-21: 40 mg via INTRAVENOUS

## 2015-02-21 MED ORDER — LEVOFLOXACIN IN D5W 250 MG/50ML IV SOLN
250.0000 mg | Freq: Once | INTRAVENOUS | Status: DC
  Filled 2015-02-21: qty 50

## 2015-02-21 MED ORDER — ONDANSETRON HCL 4 MG/2ML IJ SOLN: 4.0000 mg | Freq: Once | INTRAMUSCULAR | Status: DC | PRN

## 2015-02-21 MED ORDER — FENTANYL CITRATE (PF) 100 MCG/2ML IJ SOLN: 25.0000 ug | INTRAMUSCULAR | Status: DC | PRN

## 2015-02-21 MED ORDER — MIDAZOLAM HCL 2 MG/2ML IJ SOLN
INTRAMUSCULAR | Status: DC | PRN
  Administered 2015-02-21: 2 mg via INTRAVENOUS

## 2015-02-21 MED ORDER — PROPOFOL 10 MG/ML IV BOLUS
INTRAVENOUS | Status: DC | PRN
  Administered 2015-02-21: 200 mg via INTRAVENOUS

## 2015-02-21 MED ORDER — FENTANYL CITRATE (PF) 100 MCG/2ML IJ SOLN
INTRAMUSCULAR | Status: DC | PRN
  Administered 2015-02-21: 100 ug via INTRAVENOUS

## 2015-02-21 MED ORDER — ONDANSETRON HCL 4 MG/2ML IJ SOLN
INTRAMUSCULAR | Status: DC | PRN
  Administered 2015-02-21: 4 mg via INTRAVENOUS

## 2015-02-21 NOTE — H&P (Signed)
Primary Care Physician:  Keith Rake, MD Primary Gastroenterologist:  Dr. Candace Cruise  Pre-Procedure History & Physical: HPI:  Mason Houston is a 65 y.o. male is here for an ERCP.Marland Kitchen   Past Medical History  Diagnosis Date  . GERD (gastroesophageal reflux disease)   . Osteoarthrosis, unspecified whether generalized or localized, lower leg   . H/O Bell's palsy   . History of [place] measles   . Atrial fibrillation ([place])   . Hypertension   . Mini stroke ([place])   . Sixth cranial nerve palsy   . Fibromyalgia     Past Surgical History  Procedure Laterality Date  . Cyst excision    . Trigger finger release    . Ercp N/A 12/29/2014    Procedure: ENDOSCOPIC RETROGRADE CHOLANGIOPANCREATOGRAPHY (ERCP);  Surgeon: Hulen Luster, MD;  Location: [facility] ENDOSCOPY;  Service: Endoscopy;  Laterality: N/A;  . Peripheral vascular catheterization N/A 01/31/2015    Procedure: Porta Cath Insertion;  Surgeon: Katha Cabal, MD;  Location: [place] CV LAB;  Service: Cardiovascular;  Laterality: N/A;    Prior to Admission medications   Medication Sig Start Date End Date Taking? Authorizing Provider  acetaminophen (TYLENOL) 500 MG tablet Take 1,000 mg by mouth every 6 (six) hours as needed.    Historical Provider, MD  B Complex-C (SUPER B COMPLEX PO) Take by mouth daily.    Historical Provider, MD  baclofen (LIORESAL) 10 MG tablet Take 10 mg by mouth as needed for muscle spasms. Reported on 01/25/2015    Historical Provider, MD  diltiazem (TAZTIA XT) 360 MG 24 hr capsule TAKE 1 CAPSULE BY MOUTH EVERY DAY 11/27/14   Wellington Hampshire, MD  lidocaine-prilocaine (EMLA) cream Apply cream 1 hour before chemotherapy treatment and place saran wrap over the cream to protect clothing 01/30/15   Dmitriy Berenzon, MD  loratadine (CLARITIN) 10 MG tablet Take 10 mg by mouth daily.    Historical Provider, MD  losartan (COZAAR) 100 MG tablet Take 1 tablet (100 mg total) by mouth daily. 02/05/15   Wellington Hampshire, MD  magnesium  oxide (MAG-OX) 400 MG tablet Take 400 mg by mouth daily.    Historical Provider, MD  Melatonin 5 MG TABS Take by mouth as needed.     Historical Provider, MD  methocarbamol (ROBAXIN) 750 MG tablet Take 750 mg by mouth every 12 (twelve) hours. Reported on 01/25/2015 09/19/14   Historical Provider, MD  ondansetron (ZOFRAN) 8 MG tablet Take 1 tablet (8 mg total) by mouth every 8 (eight) hours. Start on D3 of each cycle, continue x7d, then use as needed every 8 hrs 02/05/15   Dmitriy Berenzon, MD  prochlorperazine (COMPAZINE) 10 MG tablet Take 1 tablet (10 mg total) by mouth every 6 (six) hours as needed for nausea or vomiting. Use between zofran 02/02/15   Dmitriy Berenzon, MD  ranitidine (ZANTAC) 150 MG tablet Take 150 mg by mouth at bedtime.    Historical Provider, MD  XARELTO 20 MG TABS tablet TAKE 1 TABLET BY MOUTH EVERY DAY WITH DINNER 10/26/14   Wellington Hampshire, MD    Allergies as of 02/06/2015 - Review Complete 01/31/2015  Allergen Reaction Noted  . Celebrex [celecoxib]  12/23/2011  . Penicillins  12/23/2011    Family History  Problem Relation Age of Onset  . Hypertension Mother     Social History   Social History  . Marital Status: Married    Spouse Name: N/A  . Number of Children: N/A  .  Years of Education: N/A   Occupational History  . Not on file.   Social History Main Topics  . Smoking status: Former Smoker -- 4.50 packs/day for 15 years    Types: Cigarettes  . Smokeless tobacco: Not on file  . Alcohol Use: No  . Drug Use: Yes    Special: Marijuana     Comment: PAST  . Sexual Activity: Not on file   Other Topics Concern  . Not on file   Social History Narrative    Review of Systems: See HPI, otherwise negative ROS  Physical Exam: There were no vitals taken for this visit. General:   Alert,  pleasant and cooperative in NAD Head:  Normocephalic and atraumatic. Neck:  Supple; no masses or thyromegaly. Lungs:  Clear throughout to auscultation.    Heart:   Regular rate and rhythm. Abdomen:  Soft, nontender and nondistended. Normal bowel sounds, without guarding, and without rebound.   Neurologic:  Alert and  oriented x4;  grossly normal neurologically.  Impression/Plan: Mason Houston is here for an ERCP to be performed for stent exchange  Risks, benefits, limitations, and alternatives regarding  ERCP} have been reviewed with the patient.  Questions have been answered.  All parties agreeable.   Shaasia Odle, Lupita Dawn, MD  02/21/2015, 12:30 PM

## 2015-02-21 NOTE — Transfer of Care (Signed)
Immediate Anesthesia Transfer of Care Note  Patient: Mason Houston  Procedure(s) Performed: Procedure(s): ENDOSCOPIC RETROGRADE CHOLANGIOPANCREATOGRAPHY (ERCP) (N/A)  Patient Location: PACU  Anesthesia Type:General  Level of Consciousness: sedated  Airway & Oxygen Therapy: Patient Spontanous Breathing and Patient connected to face mask oxygen  Post-op Assessment: Report given to RN and Post -op Vital signs reviewed and stable  Post vital signs: Reviewed and stable  Last Vitals:  Filed Vitals:   02/21/15 1240  BP: 133/97  Pulse: 99  Temp: 37.2 C  Resp: 20    Complications: No apparent anesthesia complications

## 2015-02-21 NOTE — Anesthesia Preprocedure Evaluation (Signed)
Anesthesia Evaluation  Patient identified by MRN, date of birth, ID band Patient awake    Reviewed: Allergy & Precautions, NPO status , Patient's Chart, lab work & pertinent test results, reviewed documented beta blocker date and time   History of Anesthesia Complications Negative for: history of anesthetic complications  Airway Mallampati: III  TM Distance: >3 FB     Dental  (+) Chipped, Edentulous Upper, Poor Dentition   Pulmonary neg shortness of breath, sleep apnea and Continuous Positive Airway Pressure Ventilation , neg COPD, neg recent URI, former smoker,           Cardiovascular Exercise Tolerance: Good hypertension, Pt. on medications (-) angina(-) CAD, (-) Past MI, (-) Cardiac Stents and (-) CABG (-) dysrhythmias + Valvular Problems/Murmurs (as a child)      Neuro/Psych PSYCHIATRIC DISORDERS Depression  Neuromuscular disease CVA    GI/Hepatic Neg liver ROS, GERD  Medicated and Controlled,  Endo/Other  neg diabetesMorbid obesity  Renal/GU negative Renal ROS  negative genitourinary   Musculoskeletal  (+) Arthritis ,   Abdominal   Peds  Hematology negative hematology ROS (+)   Anesthesia Other Findings   Reproductive/Obstetrics                             Anesthesia Physical  Anesthesia Plan  ASA: III  Anesthesia Plan: General   Post-op Pain Management:    Induction: Intravenous  Airway Management Planned: Oral ETT  Additional Equipment:   Intra-op Plan:   Post-operative Plan:   Informed Consent: I have reviewed the patients History and Physical, chart, labs and discussed the procedure including the risks, benefits and alternatives for the proposed anesthesia with the patient or authorized representative who has indicated his/her understanding and acceptance.   Dental Advisory Given  Plan Discussed with: CRNA, Anesthesiologist and Surgeon  Anesthesia Plan Comments:          Anesthesia Quick Evaluation

## 2015-02-21 NOTE — Anesthesia Postprocedure Evaluation (Signed)
Anesthesia Post Note  Patient: Mason Houston  Procedure(s) Performed: Procedure(s) (LRB): ENDOSCOPIC RETROGRADE CHOLANGIOPANCREATOGRAPHY (ERCP) (N/A)  Patient location during evaluation: PACU Anesthesia Type: General Level of consciousness: awake and alert Pain management: pain level controlled Vital Signs Assessment: post-procedure vital signs reviewed and stable Respiratory status: spontaneous breathing, nonlabored ventilation, respiratory function stable and patient connected to nasal cannula oxygen Cardiovascular status: blood pressure returned to baseline and stable Postop Assessment: no signs of nausea or vomiting Anesthetic complications: no    Last Vitals:  Filed Vitals:   02/21/15 1522 02/21/15 1532  BP: 131/71 127/76  Pulse: 76 69  Temp:    Resp: 19 22    Last Pain:  Filed Vitals:   02/21/15 1533  PainSc: 2                  Martha Clan

## 2015-02-21 NOTE — Op Note (Signed)
[facility] Patient Name: Mason Houston Procedure Date: 02/21/2015 1:51 PM MRN: [id number] Account #: [id number] Date of Birth: Jun 19, 1950 Admit Type: Outpatient Age: 65 Room: [facility] ENDO ROOM 4 Gender: Male Note Status: Finalized Procedure:            ERCP Indications:          Hx of lymphoma causing biliary obstruction. Here for                        stent removal and possible stent exchange. Providers:            Lupita Dawn. Candace Cruise, MD Referring MD:         Otila Back Manuella Ghazi (Referring MD) Medicines:            General Anesthesia Complications:        No immediate complications. Procedure:            Pre-Anesthesia Assessment:                       - Prior to the procedure, a History and Physical was                        performed, and patient medications, allergies and                        sensitivities were reviewed. The patient's tolerance of                        previous anesthesia was reviewed.                       - The risks and benefits of the procedure and the                        sedation options and risks were discussed with the                        patient. All questions were answered and informed                        consent was obtained.                       - After reviewing the risks and benefits, the patient                        was deemed in satisfactory condition to undergo the                        procedure.                       After obtaining informed consent, the scope was passed                        under direct vision. Throughout the procedure, the                        patient's blood pressure, pulse, and oxygen saturations  were monitored continuously. The ERCP was introduced                        through the mouth, and used to inject contrast into and                        used to inject contrast into the bile duct. The ERCP                        was accomplished without  difficulty. The patient                        tolerated the procedure well. Findings:      The scout film was normal. The esophagus was successfully intubated       under direct vision. The scope was advanced to a normal major papilla in       the descending duodenum without detailed examination of the pharynx,       larynx and associated structures, and upper GI tract. The upper GI tract       was grossly normal. The biliary tree contained one temporary stent. This       was found to be visibly occluded. The stent was removed using a snare.       CBD cannulated with sphinterotome. Cholangiogram, which I personally       interepreted, showed some persistent narrowing of distal CBD with air       bubbles in upper bile duct. Ampulla was not draining adequately.       Therefore, A 10 Fr by 5 cm temporary stent with two external flaps and       two internal flaps was placed into the biliary tree. Bile flowed through       the stent. The stent was in good position. Impression:           - One stent was exchanged in the biliary tree. Recommendation:       - Discharge patient to home.                       - Observe patient's clinical course.                       - Repeat ERCP in 3 months to remove stent.                       - The findings and recommendations were discussed with                        the patient.                       - Resume xarelto in 2 days on Friday. Procedure Code(s):    --- Professional ---                       [phone], Endoscopic retrograde cholangiopancreatography                        (ERCP); with removal and exchange of stent(s), biliary                        or pancreatic duct, including pre-  and post-dilation                        and guide wire passage, when performed, including                        sphincterotomy, when performed, each stent exchanged CPT copyright 2016 American Medical Association. All rights reserved. The codes documented in this report  are preliminary and upon coder review may  be revised to meet current compliance requirements. Hulen Luster, MD 02/21/2015 2:51:52 PM This report has been signed electronically. Number of Addenda: 0 Note Initiated On: 02/21/2015 1:51 PM      [facility]

## 2015-02-21 NOTE — Anesthesia Procedure Notes (Signed)
Procedure Name: Intubation Date/Time: 02/21/2015 2:20 PM Performed by: Nelda Marseille Pre-anesthesia Checklist: Patient identified, Patient being monitored, Timeout performed, Emergency Drugs available and Suction available Patient Re-evaluated:Patient Re-evaluated prior to inductionOxygen Delivery Method: Circle system utilized Preoxygenation: Pre-oxygenation with 100% oxygen Intubation Type: IV induction Ventilation: Mask ventilation without difficulty Laryngoscope Size: Mac and 3 Grade View: Grade I Tube type: Oral Tube size: 7.0 mm Number of attempts: 1 Airway Equipment and Method: Stylet Placement Confirmation: ETT inserted through vocal cords under direct vision,  positive ETCO2 and breath sounds checked- equal and bilateral Secured at: 21 cm Tube secured with: Tape Dental Injury: Teeth and Oropharynx as per pre-operative assessment

## 2015-02-23 ENCOUNTER — Inpatient Hospital Stay (HOSPITAL_BASED_OUTPATIENT_CLINIC_OR_DEPARTMENT_OTHER): Payer: 59 | Admitting: Internal Medicine

## 2015-02-23 ENCOUNTER — Encounter: Payer: Self-pay | Admitting: Internal Medicine

## 2015-02-23 ENCOUNTER — Inpatient Hospital Stay: Payer: 59

## 2015-02-23 VITALS — BP 142/78 | HR 74 | Temp 97.2°F | Resp 18 | Ht 67.0 in | Wt 252.4 lb

## 2015-02-23 DIAGNOSIS — C8330 Diffuse large B-cell lymphoma, unspecified site: Secondary | ICD-10-CM

## 2015-02-23 DIAGNOSIS — C8333 Diffuse large B-cell lymphoma, intra-abdominal lymph nodes: Secondary | ICD-10-CM

## 2015-02-23 DIAGNOSIS — Z79899 Other long term (current) drug therapy: Secondary | ICD-10-CM

## 2015-02-23 LAB — COMPREHENSIVE METABOLIC PANEL
ALT: 30 U/L (ref 17–63)
AST: 23 U/L (ref 15–41)
Albumin: 3.9 g/dL (ref 3.5–5.0)
Alkaline Phosphatase: 90 U/L (ref 38–126)
Anion gap: 6 (ref 5–15)
BUN: 20 mg/dL (ref 6–20)
CO2: 24 mmol/L (ref 22–32)
Calcium: 8.6 mg/dL — ABNORMAL LOW (ref 8.9–10.3)
Chloride: 106 mmol/L (ref 101–111)
Creatinine, Ser: 0.92 mg/dL (ref 0.61–1.24)
GFR calc Af Amer: >60 mL/min (ref >60)
GFR calc non Af Amer: >60 mL/min (ref >60)
Glucose, Bld: 260 mg/dL — ABNORMAL HIGH (ref 65–99)
Potassium: 3.9 mmol/L (ref 3.5–5.1)
Sodium: 136 mmol/L (ref 135–145)
Total Bilirubin: 0.5 mg/dL (ref 0.3–1.2)
Total Protein: 7.2 g/dL (ref 6.5–8.1)

## 2015-02-23 LAB — CBC WITH DIFFERENTIAL/PLATELET
Basophils Absolute: 0 10*3/uL (ref 0–0.1)
Basophils Relative: 0 %
Eosinophils Absolute: 0 10*3/uL (ref 0–0.7)
Eosinophils Relative: 0 %
HCT: 33.5 % — ABNORMAL LOW (ref 40.0–52.0)
Hemoglobin: 11.7 g/dL — ABNORMAL LOW (ref 13.0–18.0)
Lymphocytes Relative: 7 %
Lymphs Abs: 1 10*3/uL (ref 1.0–3.6)
MCH: 29.6 pg (ref 26.0–34.0)
MCHC: 35 g/dL (ref 32.0–36.0)
MCV: 84.7 fL (ref 80.0–100.0)
Monocytes Absolute: 1.2 10*3/uL — ABNORMAL HIGH (ref 0.2–1.0)
Monocytes Relative: 8 %
Neutro Abs: 13.4 10*3/uL — ABNORMAL HIGH (ref 1.4–6.5)
Neutrophils Relative %: 85 %
Platelets: 393 10*3/uL (ref 150–440)
RBC: 3.96 MIL/uL — ABNORMAL LOW (ref 4.40–5.90)
RDW: 15.9 % — ABNORMAL HIGH (ref 11.5–14.5)
WBC: 15.7 10*3/uL — ABNORMAL HIGH (ref 3.8–10.6)

## 2015-02-23 MED ORDER — HEPARIN SOD (PORK) LOCK FLUSH 100 UNIT/ML IV SOLN
INTRAVENOUS | Status: AC
  Filled 2015-02-23: qty 5

## 2015-02-23 MED ORDER — DOXORUBICIN HCL CHEMO IV INJECTION 2 MG/ML
50.0000 mg/m2 | Freq: Once | INTRAVENOUS | Status: AC
  Administered 2015-02-23: 116 mg via INTRAVENOUS
  Filled 2015-02-23: qty 58

## 2015-02-23 MED ORDER — SODIUM CHLORIDE 0.9 % IV SOLN
Freq: Once | INTRAVENOUS | Status: AC
  Administered 2015-02-23: 10:00:00 via INTRAVENOUS
  Filled 2015-02-23: qty 1000

## 2015-02-23 MED ORDER — PALONOSETRON HCL INJECTION 0.25 MG/5ML
0.2500 mg | Freq: Once | INTRAVENOUS | Status: AC
  Administered 2015-02-23: 0.25 mg via INTRAVENOUS
  Filled 2015-02-23: qty 5

## 2015-02-23 MED ORDER — PREDNISONE 20 MG PO TABS: ORAL_TABLET | ORAL | Status: DC

## 2015-02-23 MED ORDER — SODIUM CHLORIDE 0.9 % IV SOLN: 375.0000 mg/m2 | Freq: Once | INTRAVENOUS | Status: DC

## 2015-02-23 MED ORDER — SODIUM CHLORIDE 0.9 % IV SOLN
375.0000 mg/m2 | Freq: Once | INTRAVENOUS | Status: AC
  Administered 2015-02-23: 900 mg via INTRAVENOUS
  Filled 2015-02-23: qty 20

## 2015-02-23 MED ORDER — ACETAMINOPHEN 325 MG PO TABS
650.0000 mg | ORAL_TABLET | Freq: Once | ORAL | Status: AC
  Administered 2015-02-23: 650 mg via ORAL
  Filled 2015-02-23: qty 2

## 2015-02-23 MED ORDER — VINCRISTINE SULFATE CHEMO INJECTION 1 MG/ML
2.0000 mg | Freq: Once | INTRAVENOUS | Status: AC
  Administered 2015-02-23: 2 mg via INTRAVENOUS
  Filled 2015-02-23: qty 2

## 2015-02-23 MED ORDER — DIPHENHYDRAMINE HCL 25 MG PO CAPS
50.0000 mg | ORAL_CAPSULE | Freq: Once | ORAL | Status: AC
  Administered 2015-02-23: 50 mg via ORAL
  Filled 2015-02-23: qty 2

## 2015-02-23 MED ORDER — SODIUM CHLORIDE 0.9 % IV SOLN
750.0000 mg/m2 | Freq: Once | INTRAVENOUS | Status: AC
  Administered 2015-02-23: 1720 mg via INTRAVENOUS
  Filled 2015-02-23: qty 86

## 2015-02-23 MED ORDER — PEGFILGRASTIM 6 MG/0.6ML ~~LOC~~ PSKT
6.0000 mg | PREFILLED_SYRINGE | Freq: Once | SUBCUTANEOUS | Status: AC
  Administered 2015-02-23: 6 mg via SUBCUTANEOUS
  Filled 2015-02-23: qty 0.6

## 2015-02-23 MED ORDER — PREDNISONE 20 MG PO TABS: 80.0000 mg | ORAL_TABLET | Freq: Every day | ORAL | Status: AC

## 2015-02-23 MED ORDER — HEPARIN SOD (PORK) LOCK FLUSH 100 UNIT/ML IV SOLN
500.0000 [IU] | Freq: Once | INTRAVENOUS | Status: AC | PRN
  Administered 2015-02-23: 500 [IU]

## 2015-02-23 MED ORDER — SODIUM CHLORIDE 0.9 % IV SOLN
10.0000 mg | Freq: Once | INTRAVENOUS | Status: AC
  Administered 2015-02-23: 10 mg via INTRAVENOUS
  Filled 2015-02-23: qty 1

## 2015-02-23 NOTE — Progress Notes (Signed)
[name] [facility] [phone]  Fax:([phone]) [place] OB: 01-Aug-1950  MR#: [id number]  SAY#:[id number]  Patient Care Team: Roselee Nova, MD as PCP - General (Internal Medicine)  CHIEF COMPLAINT: No chief complaint on file.    No history exists.    Oncology Flowsheet 02/02/2015 02/21/2015  Day, Cycle Day 1, Cycle 1 -  cyclophosphamide (CYTOXAN) IV 750 mg/m2 -  dexamethasone (DECADRON) IV 10 mg -  diphenhydrAMINE (BENADRYL) PO 50 mg -  DOXOrubicin (ADRIAMYCIN) IV 50 mg/m2 -  ondansetron (ZOFRAN) IV - -  palonosetron (ALOXI) IV 0.25 mg -  pegfilgrastim (NEULASTA ONPRO KIT) [place] 6 mg -  riTUXimab (RITUXAN) IV 375 mg/m2 -  vinCRIStine (ONCOVIN) IV 2 mg -    HISTORY OF PRESENT ILLNESS:   [name] is 65 year old gentleman with a history of mini strokes, atrial fibrillation, who developed painless jaundice over several weeks in late 2016. He was admitted to the hospital in December 2016, and underwent evaluation, which revealed fullness of the pancreatic head with a suspicious mass, and associated biliary dilatation. He underwent ERCP on 12/29/2014 with placement of 2 stents. He tolerated the procedure well, has not had fever, chills, nausea, vomiting, diarrhea, constipation. Ultrasound-guided biopsy of the pancreatic head mass was obtained. 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 More specific diagnosis was difficult due to limited amount of material available for review. PET/CT scan showed a single lesion with high SUV in the head of the pancreas. No additional lymphadenopathy was identified. R-CHOP chemotherapy cycle 1 given on 02/02/2015 Cycle 2 given on 02/23/2015  Current status- [name] returns to our clinic to start C2 of R-CHOP for DLBCL of the pancreas. She has done very well during the first round of R CHOP chemotherapy. He has mild  fatigue for the first few days of chemotherapy, however, he has been able to perform all activities of daily living, including work-related activities without significant limitations. He specifically denies nausea, vomiting, chest pain, shortness of breath, diarrhea, constipation. He had last bowel movement yesterday. He takes MiraLAX once a day to every other day to regulate his bowel movements.   REVIEW OF SYSTEMS:   Review of Systems  All other systems reviewed and are negative.    PAST MEDICAL HISTORY: Past Medical History  Diagnosis Date  . GERD (gastroesophageal reflux disease)   . Osteoarthrosis, unspecified whether generalized or localized, lower leg   . H/O Bell's palsy   . History of [place] measles   . Atrial fibrillation ([place])   . Hypertension   . Mini stroke ([place])   . Sixth cranial nerve palsy   . Fibromyalgia     PAST SURGICAL HISTORY: Past Surgical History  Procedure Laterality Date  . Cyst excision    . Trigger finger release    . Ercp N/A 12/29/2014    Procedure: ENDOSCOPIC RETROGRADE CHOLANGIOPANCREATOGRAPHY (ERCP);  Surgeon: Hulen Luster, MD;  Location: [facility] ENDOSCOPY;  Service: Endoscopy;  Laterality: N/A;  . Peripheral vascular catheterization N/A 01/31/2015    Procedure: Porta Cath Insertion;  Surgeon: Katha Cabal, MD;  Location: [place] CV LAB;  Service: Cardiovascular;  Laterality: N/A;    FAMILY HISTORY Family History  Problem Relation Age of Onset  . Hypertension Mother     ADVANCED DIRECTIVES:  No flowsheet data found.  HEALTH MAINTENANCE: Social History  Substance Use Topics  . Smoking status: Former Smoker --  4.50 packs/day for 15 years    Types: Cigarettes  . Smokeless tobacco: Not on file  . Alcohol Use: No     Allergies  Allergen Reactions  . Celebrex [Celecoxib]   . Penicillins     Hives     Current Outpatient Prescriptions  Medication Sig Dispense Refill  . acetaminophen (TYLENOL) 500 MG tablet Take 1,000 mg by  mouth every 6 (six) hours as needed.    . B Complex-C (SUPER B COMPLEX PO) Take by mouth daily.    . baclofen (LIORESAL) 10 MG tablet Take 10 mg by mouth as needed for muscle spasms. Reported on 01/25/2015    . diltiazem (TAZTIA XT) 360 MG 24 hr capsule TAKE 1 CAPSULE BY MOUTH EVERY DAY 30 capsule 11  . lidocaine-prilocaine (EMLA) cream Apply cream 1 hour before chemotherapy treatment and place saran wrap over the cream to protect clothing 30 g 1  . loratadine (CLARITIN) 10 MG tablet Take 10 mg by mouth daily.    Marland Kitchen losartan (COZAAR) 100 MG tablet Take 1 tablet (100 mg total) by mouth daily. 90 tablet 3  . magnesium oxide (MAG-OX) 400 MG tablet Take 400 mg by mouth daily.    . Melatonin 5 MG TABS Take by mouth as needed.     . methocarbamol (ROBAXIN) 750 MG tablet Take 750 mg by mouth every 12 (twelve) hours. Reported on 01/25/2015  3  . ondansetron (ZOFRAN) 8 MG tablet Take 1 tablet (8 mg total) by mouth every 8 (eight) hours. Start on D3 of each cycle, continue x7d, then use as needed every 8 hrs 30 tablet 2  . prochlorperazine (COMPAZINE) 10 MG tablet Take 1 tablet (10 mg total) by mouth every 6 (six) hours as needed for nausea or vomiting. Use between zofran 60 tablet 2  . ranitidine (ZANTAC) 150 MG tablet Take 150 mg by mouth at bedtime.    Alveda Reasons 20 MG TABS tablet TAKE 1 TABLET BY MOUTH EVERY DAY WITH DINNER 30 tablet 6   No current facility-administered medications for this visit.    OBJECTIVE:  Filed Vitals:   02/23/15 0936  BP: 142/78  Pulse: 74  Temp: 97.2 F (36.2 C)  Resp: 18     Body mass index is 39.53 kg/(m^2).    ECOG FS:0 - Asymptomatic  Physical Exam  Constitutional: He is oriented to person, place, and time and well-developed, well-nourished, and in no distress. No distress.  Obese Caucasian male.  HENT:  Head: Normocephalic and atraumatic.  Right Ear: External ear normal.  Left Ear: External ear normal.  Nose: Nose normal.  Mouth/Throat: Oropharynx is clear  and moist. No oropharyngeal exudate.  Eyes: Conjunctivae and EOM are normal. Pupils are equal, round, and reactive to light. Right eye exhibits no discharge. Left eye exhibits no discharge. No scleral icterus.  Neck: Normal range of motion. Neck supple. No JVD present. No tracheal deviation present. No thyromegaly present.  Cardiovascular: Normal rate, regular rhythm, normal heart sounds and intact distal pulses.  Exam reveals no gallop and no friction rub.   No murmur heard. Pulmonary/Chest: Effort normal and breath sounds normal. No stridor. No respiratory distress. He has no wheezes. He has no rales. He exhibits no tenderness.  Abdominal: Soft. Bowel sounds are normal. He exhibits no distension and no mass. There is no tenderness. There is no rebound and no guarding.  Genitourinary:  Patient deferred  Musculoskeletal: Normal range of motion. He exhibits no edema or tenderness.  Lymphadenopathy:  He has no cervical adenopathy.  Neurological: He is alert and oriented to person, place, and time. He has normal reflexes. No cranial nerve deficit. He exhibits normal muscle tone. Gait normal. Coordination normal. GCS score is 15.  Skin: Skin is warm and dry. No rash noted. He is not diaphoretic. No erythema. No pallor.  Psychiatric: Mood, memory, affect and judgment normal.  Nursing note and vitals reviewed.    LAB RESULTS:  CBC Latest Ref Rng 02/16/2015 02/09/2015  WBC 3.8 - 10.6 K/uL 11.0(H) 2.1(L)  Hemoglobin 13.0 - 18.0 g/dL 12.5(L) 12.0(L)  Hematocrit 40.0 - 52.0 % 36.5(L) 34.8(L)  Platelets 150 - 440 K/uL 200 170    No visits with results within 5 Day(s) from this visit. Latest known visit with results is:  Appointment on 02/16/2015  Component Date Value Ref Range Status  . WBC 02/16/2015 11.0* 3.8 - 10.6 K/uL Final  . RBC 02/16/2015 4.29* 4.40 - 5.90 MIL/uL Final  . Hemoglobin 02/16/2015 12.5* 13.0 - 18.0 g/dL Final  . HCT 02/16/2015 36.5* 40.0 - 52.0 % Final  . MCV 02/16/2015  85.0  80.0 - 100.0 fL Final  . MCH 02/16/2015 29.1  26.0 - 34.0 pg Final  . MCHC 02/16/2015 34.3  32.0 - 36.0 g/dL Final  . RDW 02/16/2015 15.8* 11.5 - 14.5 % Final  . Platelets 02/16/2015 200  150 - 440 K/uL Final  . Neutrophils Relative % 02/16/2015 77   Final  . Neutro Abs 02/16/2015 8.5* 1.4 - 6.5 K/uL Final  . Lymphocytes Relative 02/16/2015 13   Final  . Lymphs Abs 02/16/2015 1.4  1.0 - 3.6 K/uL Final  . Monocytes Relative 02/16/2015 8   Final  . Monocytes Absolute 02/16/2015 0.9  0.2 - 1.0 K/uL Final  . Eosinophils Relative 02/16/2015 1   Final  . Eosinophils Absolute 02/16/2015 0.1  0 - 0.7 K/uL Final  . Basophils Relative 02/16/2015 1   Final  . Basophils Absolute 02/16/2015 0.1  0 - 0.1 K/uL Final  . Sodium 02/16/2015 139  135 - 145 mmol/L Final  . Potassium 02/16/2015 4.5  3.5 - 5.1 mmol/L Final  . Chloride 02/16/2015 103  101 - 111 mmol/L Final  . CO2 02/16/2015 31  22 - 32 mmol/L Final  . Glucose, Bld 02/16/2015 136* 65 - 99 mg/dL Final  . BUN 02/16/2015 16  6 - 20 mg/dL Final  . Creatinine, Ser 02/16/2015 1.17  0.61 - 1.24 mg/dL Final  . Calcium 02/16/2015 9.1  8.9 - 10.3 mg/dL Final  . GFR calc non Af Amer 02/16/2015 >60  >60 mL/min Final  . GFR calc Af Amer 02/16/2015 >60  >60 mL/min Final   Comment: (NOTE) The eGFR has been calculated using the CKD EPI equation. This calculation has not been validated in all clinical situations. eGFR's persistently <60 mL/min signify possible Chronic Kidney Disease.   . Anion gap 02/16/2015 5  5 - 15 Final    STUDIES: Nm Pet Image Initial (pi) Skull Base To Thigh  01/24/2015  CLINICAL DATA:  Initial treatment strategy for lymphoma. Pancreatic head mass with pathology revealing lymphoma. EXAM: NUCLEAR MEDICINE PET SKULL BASE TO THIGH TECHNIQUE: 12.7 mCi F-18 FDG was injected intravenously. Full-ring PET imaging was performed from the skull base to thigh after the radiotracer. CT data was obtained and used for attenuation correction  and anatomic localization. FASTING BLOOD GLUCOSE:  Value: 100 mg/dl COMPARISON:  CT 12/28/2014 FINDINGS: NECK No hypermetabolic lymph nodes in the neck. CHEST 5 mm  nodule in the RIGHT upper lobe on image 77, series 3. No additional pulmonary nodules. No hypermetabolic mediastinal lymph nodes. ABDOMEN/PELVIS Hypermetabolic focal lesion in the head of the pancreas with SUV max equal 17.2. There is a 21 mm fullness at this level. Stent within the common bile duct entering the duodenum. No abnormal metabolic activity in the liver. No hypermetabolic abdominal or pelvic lymph nodes. No abnormal bowel activity. SKELETON No focal hypermetabolic activity to suggest skeletal metastasis. IMPRESSION: 1. Single focus of hypermetabolic activity localizing to the pancreatic head where a small mass lesions resides. 2. No evidence of distant metastasis. 3. Small RIGHT upper lobe pulmonary nodule is likely benign and unlikely a solitary metastatic lesion. Recommend attention on follow-up. Electronically Signed   By: Suzy Bouchard M.D.   On: 01/24/2015 14:32   Dg C-arm 1-60 Min-no Report  02/21/2015  CLINICAL DATA: ERCP C-ARM 1-60 MINUTES Fluoroscopy was utilized by the requesting physician.  No radiographic interpretation.    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 We will continue with our treatment, administering cycle 2 today with G-CSF support. He will return to our clinic on a weekly basis for lab checks. Patient will be referred to radiation oncology after the third cycle is administered. Patient will need PET CT scan 2-3 weeks after completion of cycle 3, and the decision will be made regarding involved field radiation. Dr.Brahmanday will assume care for [name] after my departure from [facility] next week.  Biliary obstruction-secondary to diffuse large  B-cell lymphoma of the pancreatic head. Patient had replacement of the biliary stent 2 days ago, tolerated the procedure well. Patient will likely need removal of the stent after the completion of treatment. He continues to follow-up with our GI colleagues.  Patient expressed understanding and was in agreement with this plan. He also understands that He can call clinic at any time with any questions, concerns, or complaints.    No matching staging information was found for the patient.  Roxana Hires, MD   02/23/2015 8:57 AM

## 2015-02-23 NOTE — Progress Notes (Signed)
Pt had a great first treatment, little fatigue for a couple of days. He is trying to eat better and watch sugar intake with the steroids he takes first week. He has had some gas more than usual.  Taking miralax every day and has good BM.  No nausea. He did have indigestion for first week but it was not bad.  Took a couple of tums and he is taking ranitidine.

## 2015-02-23 NOTE — Addendum Note (Signed)
Addended by: Luella Cook on: 02/23/2015 04:24 PM   Modules accepted: Orders

## 2015-02-25 ENCOUNTER — Encounter: Payer: Self-pay | Admitting: Gastroenterology

## 2015-03-02 ENCOUNTER — Inpatient Hospital Stay: Payer: 59

## 2015-03-02 DIAGNOSIS — C8330 Diffuse large B-cell lymphoma, unspecified site: Secondary | ICD-10-CM

## 2015-03-02 DIAGNOSIS — C8333 Diffuse large B-cell lymphoma, intra-abdominal lymph nodes: Secondary | ICD-10-CM | POA: Diagnosis not present

## 2015-03-02 LAB — CBC WITH DIFFERENTIAL/PLATELET
Basophils Absolute: 0 10*3/uL (ref 0–0.1)
Basophils Relative: 0 %
Eosinophils Absolute: 0.1 10*3/uL (ref 0–0.7)
Eosinophils Relative: 3 %
HCT: 34.7 % — ABNORMAL LOW (ref 40.0–52.0)
Hemoglobin: 11.9 g/dL — ABNORMAL LOW (ref 13.0–18.0)
Lymphocytes Relative: 26 %
Lymphs Abs: 0.8 10*3/uL — ABNORMAL LOW (ref 1.0–3.6)
MCH: 29.7 pg (ref 26.0–34.0)
MCHC: 34.3 g/dL (ref 32.0–36.0)
MCV: 86.5 fL (ref 80.0–100.0)
Monocytes Absolute: 0.3 10*3/uL (ref 0.2–1.0)
Monocytes Relative: 11 %
Neutro Abs: 1.9 10*3/uL (ref 1.4–6.5)
Neutrophils Relative %: 60 %
Platelets: 250 10*3/uL (ref 150–440)
RBC: 4.02 MIL/uL — ABNORMAL LOW (ref 4.40–5.90)
RDW: 15.8 % — ABNORMAL HIGH (ref 11.5–14.5)
WBC: 3.1 10*3/uL — ABNORMAL LOW (ref 3.8–10.6)

## 2015-03-02 LAB — COMPREHENSIVE METABOLIC PANEL
ALT: 27 U/L (ref 17–63)
AST: 18 U/L (ref 15–41)
Albumin: 3.9 g/dL (ref 3.5–5.0)
Alkaline Phosphatase: 119 U/L (ref 38–126)
Anion gap: 6 (ref 5–15)
BUN: 24 mg/dL — ABNORMAL HIGH (ref 6–20)
CO2: 32 mmol/L (ref 22–32)
Calcium: 8.9 mg/dL (ref 8.9–10.3)
Chloride: 98 mmol/L — ABNORMAL LOW (ref 101–111)
Creatinine, Ser: 0.94 mg/dL (ref 0.61–1.24)
GFR calc Af Amer: >60 mL/min (ref >60)
GFR calc non Af Amer: >60 mL/min (ref >60)
Glucose, Bld: 155 mg/dL — ABNORMAL HIGH (ref 65–99)
Potassium: 4.7 mmol/L (ref 3.5–5.1)
Sodium: 136 mmol/L (ref 135–145)
Total Bilirubin: 0.9 mg/dL (ref 0.3–1.2)
Total Protein: 6.7 g/dL (ref 6.5–8.1)

## 2015-03-05 ENCOUNTER — Telehealth: Payer: Self-pay | Admitting: *Deleted

## 2015-03-05 DIAGNOSIS — C8330 Diffuse large B-cell lymphoma, unspecified site: Secondary | ICD-10-CM

## 2015-03-05 MED ORDER — OMEPRAZOLE 20 MG PO CPDR: 20.0000 mg | DELAYED_RELEASE_CAPSULE | Freq: Every day | ORAL | Status: DC

## 2015-03-05 NOTE — Telephone Encounter (Signed)
Pt states he is taking zantac otc and had lots of indigestion and taking tums and still lots of indigestion.. Use to take omeprazole and would like to try that to see if it will help with indigestion. Spoke to md and he was agreeable and I called patient and let them know that the omeprazole will be sent to pharmacy

## 2015-03-09 ENCOUNTER — Inpatient Hospital Stay: Payer: 59 | Attending: Internal Medicine

## 2015-03-09 DIAGNOSIS — I4891 Unspecified atrial fibrillation: Secondary | ICD-10-CM | POA: Diagnosis not present

## 2015-03-09 DIAGNOSIS — Z8673 Personal history of transient ischemic attack (TIA), and cerebral infarction without residual deficits: Secondary | ICD-10-CM | POA: Diagnosis not present

## 2015-03-09 DIAGNOSIS — C8339 Diffuse large B-cell lymphoma, extranodal and solid organ sites: Secondary | ICD-10-CM | POA: Diagnosis present

## 2015-03-09 DIAGNOSIS — K219 Gastro-esophageal reflux disease without esophagitis: Secondary | ICD-10-CM | POA: Insufficient documentation

## 2015-03-09 DIAGNOSIS — Z7901 Long term (current) use of anticoagulants: Secondary | ICD-10-CM | POA: Diagnosis not present

## 2015-03-09 DIAGNOSIS — C8330 Diffuse large B-cell lymphoma, unspecified site: Secondary | ICD-10-CM

## 2015-03-09 DIAGNOSIS — Z5111 Encounter for antineoplastic chemotherapy: Secondary | ICD-10-CM | POA: Diagnosis not present

## 2015-03-09 DIAGNOSIS — I10 Essential (primary) hypertension: Secondary | ICD-10-CM | POA: Diagnosis not present

## 2015-03-09 DIAGNOSIS — Z79899 Other long term (current) drug therapy: Secondary | ICD-10-CM | POA: Diagnosis not present

## 2015-03-09 DIAGNOSIS — M797 Fibromyalgia: Secondary | ICD-10-CM | POA: Insufficient documentation

## 2015-03-09 DIAGNOSIS — Z87891 Personal history of nicotine dependence: Secondary | ICD-10-CM | POA: Diagnosis not present

## 2015-03-09 LAB — CBC WITH DIFFERENTIAL/PLATELET
Basophils Absolute: 0.1 10*3/uL (ref 0–0.1)
Basophils Relative: 1 %
Eosinophils Absolute: 0.1 10*3/uL (ref 0–0.7)
Eosinophils Relative: 1 %
HCT: 33.3 % — ABNORMAL LOW (ref 40.0–52.0)
Hemoglobin: 11.1 g/dL — ABNORMAL LOW (ref 13.0–18.0)
Lymphocytes Relative: 7 %
Lymphs Abs: 1.1 10*3/uL (ref 1.0–3.6)
MCH: 29.1 pg (ref 26.0–34.0)
MCHC: 33.4 g/dL (ref 32.0–36.0)
MCV: 87.1 fL (ref 80.0–100.0)
Monocytes Absolute: 1.4 10*3/uL — ABNORMAL HIGH (ref 0.2–1.0)
Monocytes Relative: 9 %
Neutro Abs: 13.1 10*3/uL — ABNORMAL HIGH (ref 1.4–6.5)
Neutrophils Relative %: 82 %
Platelets: 178 10*3/uL (ref 150–440)
RBC: 3.83 MIL/uL — ABNORMAL LOW (ref 4.40–5.90)
RDW: 16 % — ABNORMAL HIGH (ref 11.5–14.5)
WBC: 15.7 10*3/uL — ABNORMAL HIGH (ref 3.8–10.6)

## 2015-03-09 LAB — COMPREHENSIVE METABOLIC PANEL
ALT: 44 U/L (ref 17–63)
AST: 26 U/L (ref 15–41)
Albumin: 3.8 g/dL (ref 3.5–5.0)
Alkaline Phosphatase: 136 U/L — ABNORMAL HIGH (ref 38–126)
Anion gap: 7 (ref 5–15)
BUN: 14 mg/dL (ref 6–20)
CO2: 27 mmol/L (ref 22–32)
Calcium: 8.1 mg/dL — ABNORMAL LOW (ref 8.9–10.3)
Chloride: 102 mmol/L (ref 101–111)
Creatinine, Ser: 0.9 mg/dL (ref 0.61–1.24)
GFR calc Af Amer: >60 mL/min (ref >60)
GFR calc non Af Amer: >60 mL/min (ref >60)
Glucose, Bld: 127 mg/dL — ABNORMAL HIGH (ref 65–99)
Potassium: 4.8 mmol/L (ref 3.5–5.1)
Sodium: 136 mmol/L (ref 135–145)
Total Bilirubin: 0.5 mg/dL (ref 0.3–1.2)
Total Protein: 6.9 g/dL (ref 6.5–8.1)

## 2015-03-16 ENCOUNTER — Inpatient Hospital Stay: Payer: 59

## 2015-03-16 ENCOUNTER — Inpatient Hospital Stay (HOSPITAL_BASED_OUTPATIENT_CLINIC_OR_DEPARTMENT_OTHER): Payer: 59 | Admitting: Internal Medicine

## 2015-03-16 VITALS — BP 143/72 | HR 69 | Temp 97.0°F | Resp 18 | Wt 247.4 lb

## 2015-03-16 DIAGNOSIS — C8330 Diffuse large B-cell lymphoma, unspecified site: Secondary | ICD-10-CM

## 2015-03-16 DIAGNOSIS — C8339 Diffuse large B-cell lymphoma, extranodal and solid organ sites: Secondary | ICD-10-CM | POA: Diagnosis not present

## 2015-03-16 DIAGNOSIS — Z79899 Other long term (current) drug therapy: Secondary | ICD-10-CM

## 2015-03-16 LAB — COMPREHENSIVE METABOLIC PANEL
ALT: 30 U/L (ref 17–63)
AST: 25 U/L (ref 15–41)
Albumin: 3.8 g/dL (ref 3.5–5.0)
Alkaline Phosphatase: 142 U/L — ABNORMAL HIGH (ref 38–126)
Anion gap: 7 (ref 5–15)
BUN: 16 mg/dL (ref 6–20)
CO2: 26 mmol/L (ref 22–32)
Calcium: 8.7 mg/dL — ABNORMAL LOW (ref 8.9–10.3)
Chloride: 101 mmol/L (ref 101–111)
Creatinine, Ser: 0.89 mg/dL (ref 0.61–1.24)
GFR calc Af Amer: >60 mL/min (ref >60)
GFR calc non Af Amer: >60 mL/min (ref >60)
Glucose, Bld: 98 mg/dL (ref 65–99)
Potassium: 3.8 mmol/L (ref 3.5–5.1)
Sodium: 134 mmol/L — ABNORMAL LOW (ref 135–145)
Total Bilirubin: 0.6 mg/dL (ref 0.3–1.2)
Total Protein: 7.2 g/dL (ref 6.5–8.1)

## 2015-03-16 LAB — CBC WITH DIFFERENTIAL/PLATELET
Basophils Absolute: 0.3 10*3/uL — ABNORMAL HIGH (ref 0–0.1)
Basophils Relative: 3 %
Eosinophils Absolute: 0.1 10*3/uL (ref 0–0.7)
Eosinophils Relative: 2 %
HCT: 31.8 % — ABNORMAL LOW (ref 40.0–52.0)
Hemoglobin: 11.1 g/dL — ABNORMAL LOW (ref 13.0–18.0)
Lymphocytes Relative: 14 %
Lymphs Abs: 1.2 10*3/uL (ref 1.0–3.6)
MCH: 29.9 pg (ref 26.0–34.0)
MCHC: 34.8 g/dL (ref 32.0–36.0)
MCV: 85.9 fL (ref 80.0–100.0)
Monocytes Absolute: 1.2 10*3/uL — ABNORMAL HIGH (ref 0.2–1.0)
Monocytes Relative: 13 %
Neutro Abs: 6 10*3/uL (ref 1.4–6.5)
Neutrophils Relative %: 68 %
Platelets: 451 10*3/uL — ABNORMAL HIGH (ref 150–440)
RBC: 3.7 MIL/uL — ABNORMAL LOW (ref 4.40–5.90)
RDW: 15.9 % — ABNORMAL HIGH (ref 11.5–14.5)
WBC: 8.8 10*3/uL (ref 3.8–10.6)

## 2015-03-16 MED ORDER — SODIUM CHLORIDE 0.9 % IV SOLN: 375.0000 mg/m2 | Freq: Once | INTRAVENOUS | Status: DC

## 2015-03-16 MED ORDER — PEGFILGRASTIM 6 MG/0.6ML ~~LOC~~ PSKT
6.0000 mg | PREFILLED_SYRINGE | Freq: Once | SUBCUTANEOUS | Status: AC
  Administered 2015-03-16: 6 mg via SUBCUTANEOUS
  Filled 2015-03-16: qty 0.6

## 2015-03-16 MED ORDER — DOXORUBICIN HCL CHEMO IV INJECTION 2 MG/ML
50.0000 mg/m2 | Freq: Once | INTRAVENOUS | Status: AC
  Administered 2015-03-16: 116 mg via INTRAVENOUS
  Filled 2015-03-16: qty 58

## 2015-03-16 MED ORDER — SODIUM CHLORIDE 0.9 % IV SOLN
2.0000 mg | Freq: Once | INTRAVENOUS | Status: AC
  Administered 2015-03-16: 2 mg via INTRAVENOUS
  Filled 2015-03-16: qty 2

## 2015-03-16 MED ORDER — DIPHENHYDRAMINE HCL 25 MG PO CAPS
50.0000 mg | ORAL_CAPSULE | Freq: Once | ORAL | Status: AC
Start: 2015-03-16 — End: 2015-03-16
  Administered 2015-03-16: 50 mg via ORAL
  Filled 2015-03-16: qty 2

## 2015-03-16 MED ORDER — PALONOSETRON HCL INJECTION 0.25 MG/5ML
0.2500 mg | Freq: Once | INTRAVENOUS | Status: AC
  Administered 2015-03-16: 0.25 mg via INTRAVENOUS
  Filled 2015-03-16: qty 5

## 2015-03-16 MED ORDER — SODIUM CHLORIDE 0.9 % IV SOLN
Freq: Once | INTRAVENOUS | Status: AC
  Administered 2015-03-16: 11:00:00 via INTRAVENOUS
  Filled 2015-03-16: qty 1000

## 2015-03-16 MED ORDER — HEPARIN SOD (PORK) LOCK FLUSH 100 UNIT/ML IV SOLN
500.0000 [IU] | Freq: Once | INTRAVENOUS | Status: AC
  Filled 2015-03-16: qty 5

## 2015-03-16 MED ORDER — ACETAMINOPHEN 325 MG PO TABS
650.0000 mg | ORAL_TABLET | Freq: Once | ORAL | Status: AC
  Administered 2015-03-16: 650 mg via ORAL
  Filled 2015-03-16: qty 2

## 2015-03-16 MED ORDER — SODIUM CHLORIDE 0.9% FLUSH
10.0000 mL | INTRAVENOUS | Status: DC | PRN
  Administered 2015-03-16: 10 mL via INTRAVENOUS
  Filled 2015-03-16: qty 10

## 2015-03-16 MED ORDER — SODIUM CHLORIDE 0.9 % IV SOLN
375.0000 mg/m2 | Freq: Once | INTRAVENOUS | Status: AC
  Administered 2015-03-16: 900 mg via INTRAVENOUS
  Filled 2015-03-16: qty 80

## 2015-03-16 MED ORDER — SODIUM CHLORIDE 0.9 % IV SOLN
750.0000 mg/m2 | Freq: Once | INTRAVENOUS | Status: AC
  Administered 2015-03-16: 1720 mg via INTRAVENOUS
  Filled 2015-03-16: qty 86

## 2015-03-16 MED ORDER — SODIUM CHLORIDE 0.9 % IV SOLN
10.0000 mg | Freq: Once | INTRAVENOUS | Status: AC
  Administered 2015-03-16: 10 mg via INTRAVENOUS
  Filled 2015-03-16: qty 1

## 2015-03-16 MED ORDER — HEPARIN SOD (PORK) LOCK FLUSH 100 UNIT/ML IV SOLN
500.0000 [IU] | Freq: Once | INTRAVENOUS | Status: AC | PRN
  Administered 2015-03-16: 500 [IU]

## 2015-03-16 NOTE — Progress Notes (Signed)
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 I reviewed the patient's prior charts/pertinent labs/imaging in detail; findings are summarized above. Pt was being treated by Dr.Berenzon previously.    65 year old  Male patient with above history of diffuse large B-cell lymphoma stage IE peripancreatic region currently R CHOP  Status post 2 treatments is here for follow-up.  Patient denies any tingling and numbness in the hand and feet.  His appetite is good. Is not losing any weight. No abdominal pain. No skin rash or discoloration of the skin.  No chest pain or shortness of breath or cough.  REVIEW OF SYSTEMS:  A complete 10 point review of system is done which is negative except mentioned above/history of present illness.   PAST MEDICAL HISTORY :  Past Medical History  Diagnosis Date  . GERD (gastroesophageal reflux disease)   . Osteoarthrosis, unspecified whether generalized or localized, lower leg   . H/O Bell's palsy   . History of [place] measles   . Atrial fibrillation ([place])   . Hypertension   . Mini stroke ([place])   . Sixth cranial nerve palsy   . Fibromyalgia     PAST SURGICAL HISTORY :   Past Surgical History  Procedure Laterality Date  . Cyst excision    . Trigger finger release    . Ercp N/A 12/29/2014    Procedure: ENDOSCOPIC RETROGRADE CHOLANGIOPANCREATOGRAPHY (ERCP);  Surgeon: Hulen Luster, MD;  Location: [facility] ENDOSCOPY;  Service: Endoscopy;  Laterality: N/A;  . Peripheral vascular catheterization N/A 01/31/2015    Procedure: Porta Cath Insertion;  Surgeon: Katha Cabal, MD;  Location: [facility]  LAB;  Service: Cardiovascular;  Laterality: N/A;  . Ercp N/A 02/21/2015    Procedure: ENDOSCOPIC RETROGRADE CHOLANGIOPANCREATOGRAPHY (ERCP);  Surgeon: Hulen Luster, MD;  Location: [facility] ENDOSCOPY;  Service: Gastroenterology;  Laterality: N/A;    FAMILY HISTORY :   Family History  Problem Relation Age of Onset  . Hypertension Mother     SOCIAL HISTORY:   Social History  Substance Use Topics  . Smoking status: Former Smoker -- 4.50 packs/day for 15 years    Types: Cigarettes  . Smokeless tobacco: Not on file  . Alcohol Use: No    ALLERGIES:  is allergic to celebrex and penicillins.  MEDICATIONS:  Current Outpatient Prescriptions  Medication Sig Dispense Refill  . acetaminophen (TYLENOL) 500 MG tablet Take 1,000 mg by mouth every 6 (six) hours as needed.    . B Complex-C (SUPER B COMPLEX PO) Take by mouth daily.    . baclofen (LIORESAL) 10 MG tablet Take 10 mg by mouth as needed for muscle spasms. Reported on 01/25/2015    . diltiazem (TAZTIA XT) 360 MG 24 hr capsule TAKE 1 CAPSULE BY MOUTH EVERY DAY 30 capsule 11  . lidocaine-prilocaine (EMLA) cream Apply cream 1 hour before chemotherapy treatment and place saran wrap over the cream to protect clothing 30 g 1  . loratadine (CLARITIN) 10 MG tablet Take 10 mg by mouth daily.    Marland Kitchen losartan (COZAAR) 100 MG tablet Take 1 tablet (100 mg total) by mouth daily.  90 tablet 3  . magnesium oxide (MAG-OX) 400 MG tablet Take 400 mg by mouth daily.    . Melatonin 5 MG TABS Take by mouth as needed.     Marland Kitchen omeprazole (PRILOSEC) 20 MG capsule Take 1 capsule (20 mg total) by mouth daily. 30 capsule 2  . ondansetron (ZOFRAN) 8 MG tablet Take 1 tablet (8 mg total) by mouth every 8 (eight) hours. Start on D3 of each cycle, continue x7d, then use as needed every 8 hrs 30 tablet 2  . predniSONE (DELTASONE) 20 MG tablet Take 4 tablets daily with food, for first week of treatment of chemo. 20 tablet 0  . prochlorperazine (COMPAZINE) 10 MG tablet Take 1 tablet (10  mg total) by mouth every 6 (six) hours as needed for nausea or vomiting. Use between zofran 60 tablet 2  . XARELTO 20 MG TABS tablet TAKE 1 TABLET BY MOUTH EVERY DAY WITH DINNER 30 tablet 6   No current facility-administered medications for this visit.   Facility-Administered Medications Ordered in Other Visits  Medication Dose Route Frequency Provider Last Rate Last Dose  . heparin lock flush 100 unit/mL  500 Units Intravenous Once Cammie Sickle, MD      . sodium chloride flush (NS) 0.9 % injection 10 mL  10 mL Intravenous PRN Cammie Sickle, MD   10 mL at 03/16/15 0859    PHYSICAL EXAMINATION: ECOG PERFORMANCE STATUS: 0 - Asymptomatic  BP 143/72 mmHg  Pulse 69  Temp(Src) 97 F (36.1 C)  Resp 18  Wt 247 lb 5.7 oz (112.2 kg)  Filed Weights   03/16/15 0913  Weight: 247 lb 5.7 oz (112.2 kg)    GENERAL: Well-nourished well-developed; Alert, no distress and comfortable.  Accompanied by his wife.  EYES: no pallor or icterus OROPHARYNX: no thrush or ulceration; good dentition  NECK: supple, no masses felt LYMPH:  no palpable lymphadenopathy in the cervical, axillary or inguinal regions LUNGS: clear to auscultation and  No wheeze or crackles HEART/CVS: regular rate & rhythm and no murmurs; No lower extremity edema ABDOMEN:abdomen soft, non-tender and normal bowel sounds Musculoskeletal:no cyanosis of digits and no clubbing  PSYCH: alert & oriented x 3 with fluent speech NEURO: no focal motor/sensory deficits SKIN:  no rashes or significant lesions; mediport accessed.   LABORATORY DATA:  I have reviewed the data as listed    Component Value Date/Time   NA 134* 03/16/2015 0849   NA 139 12/19/2011 1044   K 3.8 03/16/2015 0849   K 4.1 12/19/2011 1044   CL 101 03/16/2015 0849   CL 106 12/19/2011 1044   CO2 26 03/16/2015 0849   CO2 26 12/19/2011 1044   GLUCOSE 98 03/16/2015 0849   GLUCOSE 95 12/19/2011 1044   BUN 16 03/16/2015 0849   BUN 18 12/19/2011 1044    CREATININE 0.89 03/16/2015 0849   CREATININE 1.06 12/19/2011 1044   CALCIUM 8.7* 03/16/2015 0849   CALCIUM 9.0 12/19/2011 1044   PROT 7.2 03/16/2015 0849   PROT 9.0* 12/19/2011 1044   ALBUMIN 3.8 03/16/2015 0849   ALBUMIN 4.4 12/19/2011 1044   AST 25 03/16/2015 0849   AST 44* 12/19/2011 1044   ALT 30 03/16/2015 0849   ALT 46 12/19/2011 1044   ALKPHOS 142* 03/16/2015 0849   ALKPHOS 110 12/19/2011 1044   BILITOT 0.6 03/16/2015 0849   BILITOT 0.7 12/19/2011 1044   GFRNONAA >60 03/16/2015 0849   GFRNONAA >60 12/19/2011 1044   GFRAA >60 03/16/2015 [id number]  GFRAA >60 12/19/2011 1044    No results found for: SPEP, UPEP  Lab Results  Component Value Date   WBC 8.8 03/16/2015   NEUTROABS 6.0 03/16/2015   HGB 11.1* 03/16/2015   HCT 31.8* 03/16/2015   MCV 85.9 03/16/2015   PLT 451* 03/16/2015      Chemistry      Component Value Date/Time   NA 134* 03/16/2015 0849   NA 139 12/19/2011 1044   K 3.8 03/16/2015 0849   K 4.1 12/19/2011 1044   CL 101 03/16/2015 0849   CL 106 12/19/2011 1044   CO2 26 03/16/2015 0849   CO2 26 12/19/2011 1044   BUN 16 03/16/2015 0849   BUN 18 12/19/2011 1044   CREATININE 0.89 03/16/2015 0849   CREATININE 1.06 12/19/2011 1044      Component Value Date/Time   CALCIUM 8.7* 03/16/2015 0849   CALCIUM 9.0 12/19/2011 1044   ALKPHOS 142* 03/16/2015 0849   ALKPHOS 110 12/19/2011 1044   AST 25 03/16/2015 0849   AST 44* 12/19/2011 1044   ALT 30 03/16/2015 0849   ALT 46 12/19/2011 1044   BILITOT 0.6 03/16/2015 0849   BILITOT 0.7 12/19/2011 1044       ASSESSMENT & PLAN:   # DLBCL of pancreatic head-  Stage IE.  Currently status post 2 cycles of R CHOP chemotherapy.  Tolerating chemotherapy extremely well.   #  Proceed with cycle #3 of CHOP chemotherapy today.  Labs within normal limits.  Patient will get Neulasta.  #  We will plan to get  Restaging PET scan in approximately 2 weeks;  And follow-up with me in 3 weeks/ possible chemotherapy.  Discuss  that  PET scan will decide whether  If patient gets  More cycles of chemotherapy versus consolidation radiation.   # 25 minutes face-to-face with the patient discussing the above plan of care; more than 50% of time spent on prognosis/ natural history; counseling and coordination.     Cammie Sickle, MD 03/16/2015 9:28 AM

## 2015-03-23 ENCOUNTER — Inpatient Hospital Stay: Payer: 59

## 2015-03-23 ENCOUNTER — Telehealth: Payer: Self-pay | Admitting: *Deleted

## 2015-03-23 DIAGNOSIS — C8330 Diffuse large B-cell lymphoma, unspecified site: Secondary | ICD-10-CM

## 2015-03-23 DIAGNOSIS — C8339 Diffuse large B-cell lymphoma, extranodal and solid organ sites: Secondary | ICD-10-CM | POA: Diagnosis not present

## 2015-03-23 LAB — CBC WITH DIFFERENTIAL/PLATELET
Basophils Absolute: 0 10*3/uL (ref 0–0.1)
Basophils Relative: 1 %
Eosinophils Absolute: 0.1 10*3/uL (ref 0–0.7)
Eosinophils Relative: 4 %
HCT: 33.4 % — ABNORMAL LOW (ref 40.0–52.0)
Hemoglobin: 11.5 g/dL — ABNORMAL LOW (ref 13.0–18.0)
Lymphocytes Relative: 31 %
Lymphs Abs: 1 10*3/uL (ref 1.0–3.6)
MCH: 30.1 pg (ref 26.0–34.0)
MCHC: 34.5 g/dL (ref 32.0–36.0)
MCV: 87.4 fL (ref 80.0–100.0)
Monocytes Absolute: 0.2 10*3/uL (ref 0.2–1.0)
Monocytes Relative: 6 %
Neutro Abs: 1.8 10*3/uL (ref 1.4–6.5)
Neutrophils Relative %: 58 %
Platelets: 246 10*3/uL (ref 150–440)
RBC: 3.82 MIL/uL — ABNORMAL LOW (ref 4.40–5.90)
RDW: 15.5 % — ABNORMAL HIGH (ref 11.5–14.5)
WBC: 3.1 10*3/uL — ABNORMAL LOW (ref 3.8–10.6)

## 2015-03-23 LAB — BASIC METABOLIC PANEL
Anion gap: 5 (ref 5–15)
BUN: 18 mg/dL (ref 6–20)
CO2: 32 mmol/L (ref 22–32)
Calcium: 8.7 mg/dL — ABNORMAL LOW (ref 8.9–10.3)
Chloride: 97 mmol/L — ABNORMAL LOW (ref 101–111)
Creatinine, Ser: 0.75 mg/dL (ref 0.61–1.24)
GFR calc Af Amer: >60 mL/min (ref >60)
GFR calc non Af Amer: >60 mL/min (ref >60)
Glucose, Bld: 149 mg/dL — ABNORMAL HIGH (ref 65–99)
Potassium: 4.3 mmol/L (ref 3.5–5.1)
Sodium: 134 mmol/L — ABNORMAL LOW (ref 135–145)

## 2015-03-23 MED ORDER — NYSTATIN 100000 UNIT/ML MT SUSP: 5.0000 mL | Freq: Four times a day (QID) | OROMUCOSAL | Status: DC | PRN

## 2015-03-23 NOTE — Telephone Encounter (Signed)
Pt states he has been having a little bit of sore throat, and he was on the phone with his case manager and she had asked him if he had white patches in his mouth and his wife looked in his mouth and he did have white patches at back of his tongue.  I told him that I would speak to Dr. B and call him and let him know.  I did check with Dr. B and he said nystatin liq. Swish and swallow qid as needed as for white patches.  I entered it in pharmacy and called pt to let him know and if it does not help within 2-3 days please call our office. Pt agreeable to the plan.

## 2015-03-28 ENCOUNTER — Other Ambulatory Visit: Payer: Self-pay | Admitting: Internal Medicine

## 2015-03-28 ENCOUNTER — Ambulatory Visit
Admission: RE | Admit: 2015-03-28 | Discharge: 2015-03-28 | Disposition: A | Discharge status: Home / Self Care | Payer: 59 | Source: Ambulatory Visit | Attending: Internal Medicine | Admitting: Internal Medicine

## 2015-03-28 DIAGNOSIS — R937 Abnormal findings on diagnostic imaging of other parts of musculoskeletal system: Secondary | ICD-10-CM | POA: Insufficient documentation

## 2015-03-28 DIAGNOSIS — I2510 Atherosclerotic heart disease of native coronary artery without angina pectoris: Secondary | ICD-10-CM | POA: Diagnosis not present

## 2015-03-28 DIAGNOSIS — C8330 Diffuse large B-cell lymphoma, unspecified site: Secondary | ICD-10-CM | POA: Insufficient documentation

## 2015-03-28 DIAGNOSIS — R918 Other nonspecific abnormal finding of lung field: Secondary | ICD-10-CM | POA: Diagnosis not present

## 2015-03-28 LAB — GLUCOSE, CAPILLARY: Glucose-Capillary: 96 mg/dL (ref 65–99)

## 2015-03-28 MED ORDER — FLUDEOXYGLUCOSE F - 18 (FDG) INJECTION
12.4600 | Freq: Once | INTRAVENOUS | Status: AC | PRN
  Administered 2015-03-28: 12.46 via INTRAVENOUS

## 2015-03-29 ENCOUNTER — Other Ambulatory Visit: Payer: 59

## 2015-03-30 ENCOUNTER — Inpatient Hospital Stay: Payer: 59

## 2015-03-30 DIAGNOSIS — C8339 Diffuse large B-cell lymphoma, extranodal and solid organ sites: Secondary | ICD-10-CM | POA: Diagnosis not present

## 2015-03-30 DIAGNOSIS — C8330 Diffuse large B-cell lymphoma, unspecified site: Secondary | ICD-10-CM

## 2015-03-30 LAB — CBC WITH DIFFERENTIAL/PLATELET
Basophils Absolute: 0.1 10*3/uL (ref 0–0.1)
Basophils Relative: 1 %
Eosinophils Absolute: 0.2 10*3/uL (ref 0–0.7)
Eosinophils Relative: 1 %
HCT: 34.1 % — ABNORMAL LOW (ref 40.0–52.0)
Hemoglobin: 11.5 g/dL — ABNORMAL LOW (ref 13.0–18.0)
Lymphocytes Relative: 8 %
Lymphs Abs: 1.1 10*3/uL (ref 1.0–3.6)
MCH: 29.7 pg (ref 26.0–34.0)
MCHC: 33.6 g/dL (ref 32.0–36.0)
MCV: 88.2 fL (ref 80.0–100.0)
Monocytes Absolute: 0.9 10*3/uL (ref 0.2–1.0)
Monocytes Relative: 7 %
Neutro Abs: 11.3 10*3/uL — ABNORMAL HIGH (ref 1.4–6.5)
Neutrophils Relative %: 83 %
Platelets: 205 10*3/uL (ref 150–440)
RBC: 3.87 MIL/uL — ABNORMAL LOW (ref 4.40–5.90)
RDW: 15.4 % — ABNORMAL HIGH (ref 11.5–14.5)
WBC: 13.5 10*3/uL — ABNORMAL HIGH (ref 3.8–10.6)

## 2015-03-30 LAB — BASIC METABOLIC PANEL
Anion gap: 6 (ref 5–15)
BUN: 16 mg/dL (ref 6–20)
CO2: 28 mmol/L (ref 22–32)
Calcium: 8.8 mg/dL — ABNORMAL LOW (ref 8.9–10.3)
Chloride: 103 mmol/L (ref 101–111)
Creatinine, Ser: 0.9 mg/dL (ref 0.61–1.24)
GFR calc Af Amer: >60 mL/min (ref >60)
GFR calc non Af Amer: >60 mL/min (ref >60)
Glucose, Bld: 133 mg/dL — ABNORMAL HIGH (ref 65–99)
Potassium: 4.2 mmol/L (ref 3.5–5.1)
Sodium: 137 mmol/L (ref 135–145)

## 2015-04-06 ENCOUNTER — Inpatient Hospital Stay (HOSPITAL_BASED_OUTPATIENT_CLINIC_OR_DEPARTMENT_OTHER): Payer: 59 | Admitting: Internal Medicine

## 2015-04-06 ENCOUNTER — Inpatient Hospital Stay: Payer: 59

## 2015-04-06 VITALS — BP 137/71 | HR 71 | Temp 97.8°F | Resp 18 | Wt 246.9 lb

## 2015-04-06 DIAGNOSIS — Z79899 Other long term (current) drug therapy: Secondary | ICD-10-CM | POA: Diagnosis not present

## 2015-04-06 DIAGNOSIS — C8339 Diffuse large B-cell lymphoma, extranodal and solid organ sites: Secondary | ICD-10-CM

## 2015-04-06 DIAGNOSIS — C8330 Diffuse large B-cell lymphoma, unspecified site: Secondary | ICD-10-CM

## 2015-04-06 LAB — CBC WITH DIFFERENTIAL/PLATELET
Basophils Absolute: 0.2 10*3/uL — ABNORMAL HIGH (ref 0–0.1)
Basophils Relative: 2 %
Eosinophils Absolute: 0.2 10*3/uL (ref 0–0.7)
Eosinophils Relative: 2 %
HCT: 29.7 % — ABNORMAL LOW (ref 40.0–52.0)
Hemoglobin: 10.1 g/dL — ABNORMAL LOW (ref 13.0–18.0)
Lymphocytes Relative: 11 %
Lymphs Abs: 1 10*3/uL (ref 1.0–3.6)
MCH: 29.6 pg (ref 26.0–34.0)
MCHC: 34.2 g/dL (ref 32.0–36.0)
MCV: 86.6 fL (ref 80.0–100.0)
Monocytes Absolute: 1.1 10*3/uL — ABNORMAL HIGH (ref 0.2–1.0)
Monocytes Relative: 12 %
Neutro Abs: 7.2 10*3/uL — ABNORMAL HIGH (ref 1.4–6.5)
Neutrophils Relative %: 73 %
Platelets: 404 10*3/uL (ref 150–440)
RBC: 3.42 MIL/uL — ABNORMAL LOW (ref 4.40–5.90)
RDW: 15.4 % — ABNORMAL HIGH (ref 11.5–14.5)
WBC: 9.7 10*3/uL (ref 3.8–10.6)

## 2015-04-06 LAB — COMPREHENSIVE METABOLIC PANEL
ALT: 18 U/L (ref 17–63)
AST: 21 U/L (ref 15–41)
Albumin: 3.7 g/dL (ref 3.5–5.0)
Alkaline Phosphatase: 97 U/L (ref 38–126)
Anion gap: 6 (ref 5–15)
BUN: 18 mg/dL (ref 6–20)
CO2: 26 mmol/L (ref 22–32)
Calcium: 8.5 mg/dL — ABNORMAL LOW (ref 8.9–10.3)
Chloride: 102 mmol/L (ref 101–111)
Creatinine, Ser: 0.97 mg/dL (ref 0.61–1.24)
GFR calc Af Amer: >60 mL/min (ref >60)
GFR calc non Af Amer: >60 mL/min (ref >60)
Glucose, Bld: 105 mg/dL — ABNORMAL HIGH (ref 65–99)
Potassium: 3.8 mmol/L (ref 3.5–5.1)
Sodium: 134 mmol/L — ABNORMAL LOW (ref 135–145)
Total Bilirubin: 0.4 mg/dL (ref 0.3–1.2)
Total Protein: 6.9 g/dL (ref 6.5–8.1)

## 2015-04-06 MED ORDER — SODIUM CHLORIDE 0.9 % IV SOLN: 375.0000 mg/m2 | Freq: Once | INTRAVENOUS | Status: DC

## 2015-04-06 MED ORDER — PALONOSETRON HCL INJECTION 0.25 MG/5ML
0.2500 mg | Freq: Once | INTRAVENOUS | Status: AC
  Administered 2015-04-06: 0.25 mg via INTRAVENOUS
  Filled 2015-04-06: qty 5

## 2015-04-06 MED ORDER — SODIUM CHLORIDE 0.9 % IV SOLN
Freq: Once | INTRAVENOUS | Status: AC
  Administered 2015-04-06: 09:00:00 via INTRAVENOUS
  Filled 2015-04-06: qty 1000

## 2015-04-06 MED ORDER — PEGFILGRASTIM 6 MG/0.6ML ~~LOC~~ PSKT
6.0000 mg | PREFILLED_SYRINGE | Freq: Once | SUBCUTANEOUS | Status: AC
  Administered 2015-04-06: 6 mg via SUBCUTANEOUS
  Filled 2015-04-06: qty 0.6

## 2015-04-06 MED ORDER — SODIUM CHLORIDE 0.9 % IV SOLN
750.0000 mg/m2 | Freq: Once | INTRAVENOUS | Status: AC
  Administered 2015-04-06: 1720 mg via INTRAVENOUS
  Filled 2015-04-06: qty 86

## 2015-04-06 MED ORDER — ACETAMINOPHEN 325 MG PO TABS
650.0000 mg | ORAL_TABLET | Freq: Once | ORAL | Status: AC
  Administered 2015-04-06: 650 mg via ORAL
  Filled 2015-04-06: qty 2

## 2015-04-06 MED ORDER — DOXORUBICIN HCL CHEMO IV INJECTION 2 MG/ML
50.0000 mg/m2 | Freq: Once | INTRAVENOUS | Status: AC
  Administered 2015-04-06: 116 mg via INTRAVENOUS
  Filled 2015-04-06: qty 58

## 2015-04-06 MED ORDER — SODIUM CHLORIDE 0.9% FLUSH
10.0000 mL | INTRAVENOUS | Status: DC | PRN
  Administered 2015-04-06: 10 mL via INTRAVENOUS
  Filled 2015-04-06: qty 10

## 2015-04-06 MED ORDER — SODIUM CHLORIDE 0.9 % IV SOLN
10.0000 mg | Freq: Once | INTRAVENOUS | Status: AC
  Administered 2015-04-06: 10 mg via INTRAVENOUS
  Filled 2015-04-06: qty 1

## 2015-04-06 MED ORDER — VINCRISTINE SULFATE CHEMO INJECTION 1 MG/ML
2.0000 mg | Freq: Once | INTRAVENOUS | Status: AC
  Administered 2015-04-06: 2 mg via INTRAVENOUS
  Filled 2015-04-06: qty 2

## 2015-04-06 MED ORDER — PREDNISONE 20 MG PO TABS: ORAL_TABLET | ORAL | Status: DC

## 2015-04-06 MED ORDER — SODIUM CHLORIDE 0.9 % IV SOLN
375.0000 mg/m2 | Freq: Once | INTRAVENOUS | Status: AC
  Administered 2015-04-06: 900 mg via INTRAVENOUS
  Filled 2015-04-06: qty 20

## 2015-04-06 MED ORDER — HEPARIN SOD (PORK) LOCK FLUSH 100 UNIT/ML IV SOLN
500.0000 [IU] | Freq: Once | INTRAVENOUS | Status: AC
  Administered 2015-04-06: 500 [IU] via INTRAVENOUS
  Filled 2015-04-06: qty 5

## 2015-04-06 MED ORDER — DIPHENHYDRAMINE HCL 25 MG PO CAPS
50.0000 mg | ORAL_CAPSULE | Freq: Once | ORAL | Status: AC
  Administered 2015-04-06: 50 mg via ORAL
  Filled 2015-04-06: qty 2

## 2015-04-06 NOTE — Progress Notes (Signed)
Patient states he has had a cough and runny nose for the past week or so.  He has started using Coricidin for this.  Otherwise has no complaints.

## 2015-04-06 NOTE — Progress Notes (Signed)
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   # Obstructive jaundice-sec to above s/p Stent [Dr.Oh]   INTERVAL HISTORY:  65 year old  Male patient with above history of diffuse large B-cell lymphoma stage IE peripancreatic region currently R CHOP  Status post 3 treatments is here for follow-up/to review the results of his PET scan.  No nausea no vomiting. Appetite is good. He had mild sinus congestion-on antihistamine improved. Patient denies any tingling and numbness in the hand and feet.  His appetite is good. Is not losing any weight. No abdominal pain. No skin rash or discoloration of the skin.  No chest pain or shortness of breath. Mild cough.   REVIEW OF SYSTEMS:  A complete 10 point review of system is done which is negative except mentioned above/history of present illness.   PAST MEDICAL HISTORY :  Past Medical History  Diagnosis Date  . GERD (gastroesophageal reflux disease)   . Osteoarthrosis, unspecified whether generalized or localized, lower leg   . H/O Bell's palsy   . History of [place] measles   . Atrial fibrillation ([place])   . Hypertension   . Mini stroke ([place])   . Sixth cranial nerve palsy   . Fibromyalgia     PAST SURGICAL HISTORY :   Past Surgical History  Procedure Laterality Date  . Cyst excision    . Trigger finger release    . Ercp N/A 12/29/2014    Procedure: ENDOSCOPIC RETROGRADE CHOLANGIOPANCREATOGRAPHY (ERCP);  Surgeon: Hulen Luster, MD;  Location: [facility] ENDOSCOPY;  Service: Endoscopy;  Laterality: N/A;  . Peripheral vascular catheterization N/A 01/31/2015    Procedure: Porta Cath Insertion;  Surgeon: Katha Cabal, MD;  Location: [facility] LAB;  Service: Cardiovascular;   Laterality: N/A;  . Ercp N/A 02/21/2015    Procedure: ENDOSCOPIC RETROGRADE CHOLANGIOPANCREATOGRAPHY (ERCP);  Surgeon: Hulen Luster, MD;  Location: [facility] ENDOSCOPY;  Service: Gastroenterology;  Laterality: N/A;    FAMILY HISTORY :   Family History  Problem Relation Age of Onset  . Hypertension Mother     SOCIAL HISTORY:   Social History  Substance Use Topics  . Smoking status: Former Smoker -- 4.50 packs/day for 15 years    Types: Cigarettes  . Smokeless tobacco: Not on file  . Alcohol Use: No    ALLERGIES:  is allergic to celebrex and penicillins.  MEDICATIONS:  Current Outpatient Prescriptions  Medication Sig Dispense Refill  . acetaminophen (TYLENOL) 500 MG tablet Take 1,000 mg by mouth every 6 (six) hours as needed.    . B Complex-C (SUPER B COMPLEX PO) Take by mouth daily.    . baclofen (LIORESAL) 10 MG tablet Take 10 mg by mouth as needed for muscle spasms. Reported on 01/25/2015    . Chlorphen-Pseudoephed-APAP (CORICIDIN D PO) Take by mouth.    . diltiazem (TAZTIA XT) 360 MG 24 hr capsule TAKE 1 CAPSULE BY MOUTH EVERY DAY 30 capsule 11  . lidocaine-prilocaine (EMLA) cream Apply cream 1 hour before chemotherapy treatment and place saran wrap over the cream to protect clothing 30 g 1  . loratadine (CLARITIN) 10 MG tablet Take 10 mg by mouth daily.    Marland Kitchen losartan (COZAAR) 100 MG tablet  Take 1 tablet (100 mg total) by mouth daily. 90 tablet 3  . magnesium oxide (MAG-OX) 400 MG tablet Take 400 mg by mouth daily.    . Melatonin 5 MG TABS Take by mouth as needed.     . nystatin (MYCOSTATIN) 100000 UNIT/ML suspension Take 5 mLs (500,000 Units total) by mouth 4 (four) times daily as needed. 480 mL 1  . omeprazole (PRILOSEC) 20 MG capsule Take 1 capsule (20 mg total) by mouth daily. 30 capsule 2  . ondansetron (ZOFRAN) 8 MG tablet Take 1 tablet (8 mg total) by mouth every 8 (eight) hours. Start on D3 of each cycle, continue x7d, then use as needed every 8 hrs 30 tablet 2  . predniSONE  (DELTASONE) 20 MG tablet Take 4 tablets daily with food, for first week of treatment of chemo. 60 tablet 0  . prochlorperazine (COMPAZINE) 10 MG tablet Take 1 tablet (10 mg total) by mouth every 6 (six) hours as needed for nausea or vomiting. Use between zofran 60 tablet 2  . XARELTO 20 MG TABS tablet TAKE 1 TABLET BY MOUTH EVERY DAY WITH DINNER 30 tablet 6   No current facility-administered medications for this visit.   Facility-Administered Medications Ordered in Other Visits  Medication Dose Route Frequency Provider Last Rate Last Dose  . heparin lock flush 100 unit/mL  500 Units Intravenous Once Cammie Sickle, MD      . sodium chloride flush (NS) 0.9 % injection 10 mL  10 mL Intravenous PRN Cammie Sickle, MD   10 mL at 04/06/15 0816    PHYSICAL EXAMINATION: ECOG PERFORMANCE STATUS: 0 - Asymptomatic  BP 137/71 mmHg  Pulse 71  Temp(Src) 97.8 F (36.6 C) (Tympanic)  Resp 18  Wt 246 lb 14.6 oz (112 kg)  Filed Weights   04/06/15 0842  Weight: 246 lb 14.6 oz (112 kg)    GENERAL: Well-nourished well-developed; Alert, no distress and comfortable.  Accompanied by his wife.  EYES: no pallor or icterus OROPHARYNX: no thrush or ulceration; good dentition  NECK: supple, no masses felt LYMPH:  no palpable lymphadenopathy in the cervical, axillary or inguinal regions LUNGS: clear to auscultation and  No wheeze or crackles HEART/CVS: regular rate & rhythm and no murmurs; No lower extremity edema ABDOMEN:abdomen soft, non-tender and normal bowel sounds Musculoskeletal:no cyanosis of digits and no clubbing  PSYCH: alert & oriented x 3 with fluent speech NEURO: no focal motor/sensory deficits SKIN:  no rashes or significant lesions; mediport accessed.   LABORATORY DATA:  I have reviewed the data as listed    Component Value Date/Time   NA 134* 04/06/2015 0816   NA 139 12/19/2011 1044   K 3.8 04/06/2015 0816   K 4.1 12/19/2011 1044   CL 102 04/06/2015 0816   CL 106  12/19/2011 1044   CO2 26 04/06/2015 0816   CO2 26 12/19/2011 1044   GLUCOSE 105* 04/06/2015 0816   GLUCOSE 95 12/19/2011 1044   BUN 18 04/06/2015 0816   BUN 18 12/19/2011 1044   CREATININE 0.97 04/06/2015 0816   CREATININE 1.06 12/19/2011 1044   CALCIUM 8.5* 04/06/2015 0816   CALCIUM 9.0 12/19/2011 1044   PROT 6.9 04/06/2015 0816   PROT 9.0* 12/19/2011 1044   ALBUMIN 3.7 04/06/2015 0816   ALBUMIN 4.4 12/19/2011 1044   AST 21 04/06/2015 0816   AST 44* 12/19/2011 1044   ALT 18 04/06/2015 0816   ALT 46 12/19/2011 1044   ALKPHOS 97 04/06/2015 0816   ALKPHOS  110 12/19/2011 1044   BILITOT 0.4 04/06/2015 0816   BILITOT 0.7 12/19/2011 1044   GFRNONAA >60 04/06/2015 0816   GFRNONAA >60 12/19/2011 1044   GFRAA >60 04/06/2015 0816   GFRAA >60 12/19/2011 1044    No results found for: SPEP, UPEP  Lab Results  Component Value Date   WBC 9.7 04/06/2015   NEUTROABS 7.2* 04/06/2015   HGB 10.1* 04/06/2015   HCT 29.7* 04/06/2015   MCV 86.6 04/06/2015   PLT 404 04/06/2015      Chemistry      Component Value Date/Time   NA 134* 04/06/2015 0816   NA 139 12/19/2011 1044   K 3.8 04/06/2015 0816   K 4.1 12/19/2011 1044   CL 102 04/06/2015 0816   CL 106 12/19/2011 1044   CO2 26 04/06/2015 0816   CO2 26 12/19/2011 1044   BUN 18 04/06/2015 0816   BUN 18 12/19/2011 1044   CREATININE 0.97 04/06/2015 0816   CREATININE 1.06 12/19/2011 1044      Component Value Date/Time   CALCIUM 8.5* 04/06/2015 0816   CALCIUM 9.0 12/19/2011 1044   ALKPHOS 97 04/06/2015 0816   ALKPHOS 110 12/19/2011 1044   AST 21 04/06/2015 0816   AST 44* 12/19/2011 1044   ALT 18 04/06/2015 0816   ALT 46 12/19/2011 1044   BILITOT 0.4 04/06/2015 0816   BILITOT 0.7 12/19/2011 1044       ASSESSMENT & PLAN:   # DLBCL of pancreatic head-  Stage IE.  Currently status post 3 cycles of R CHOP chemotherapy.  Tolerating chemotherapy extremely well. Complete resolution of the pancreatic head uptake. I also discussed  with radiation oncology; Dr. Donella Stade who feels given the small primary/good response to PET scan- continue with systemic therapy. Patient tolerating chemotherapy extremely well.  # Proceed with total of 6 cycles of chemotherapy. Proceed with cycle #4 today; CBC CMP within normal limits except for mild anemia hemoglobin 10. Patient will get on Pro. He  # Thrush resolved.   # Sinus congestion antihistamine resolved.   # Biliary stent in place- plans for stent extraction and of April/May with dr.Oh- he will most likely not a stent given the excellent response to chemotherapy    # I reviewed the images myself reviewed the images with the patient in detail.   # 25 minutes face-to-face with the patient discussing the above plan of care; more than 50% of time spent on prognosis/ natural history; counseling and coordination.     Cammie Sickle, MD 04/06/2015 9:11 AM

## 2015-04-08 ENCOUNTER — Telehealth: Payer: Self-pay | Admitting: Hematology and Oncology

## 2015-04-08 NOTE — Telephone Encounter (Signed)
Re:  Burning on urination  Patient notes received chemotherapy (RCHOP) on Friday.  He has had a little burning on urination for the past 2 days.  Burning occurs with initiation of stream.  Better today then yesterday.  No fever or flank pain.  No urgency or frequency.  Differential includes urethritis, cystitis, or prostatitis.  Patient notes a history of prostatitis years ago ("brown urine and soft prostate").  Discuss with patient empiric antibiotics (ciprofloxacin 500 mg BID x 7 days) or follow-up in clinic tomorrow for UA and culture and possible evaluation.  As patient improving and symptoms minimal, no antibiotics.  He is to call back if symptoms increase for initiation of antibiotics.   Lequita Asal, MD

## 2015-04-09 ENCOUNTER — Telehealth: Payer: Self-pay | Admitting: *Deleted

## 2015-04-09 NOTE — Telephone Encounter (Signed)
Spoke to pt this am and he states that over the weekend he spoke to Dr. Mike Gip and told her that he was having burning sensation when he starts to urinate.  She had told him to inc. Fluid intake and that he may need atb if he cont. To feel this way or worse.  He states that it only brings him slight discomfort upon urination and much better than sat. i spoke to Dr. B and since sx better then he rec: to keep eye on it and make sure he is drinking plenty of fluids.  He states that he is drinking plenty and the sx is much better. He will keep an eye on it and call us if it gets worse.  He also spoke to me about he did not have a lab appt for next week and he always does.  I checked into the orders and he should be for week 2 lab only. I will send message to sch. And he wants the one this Friday changed to 10 am.  Will do.

## 2015-04-13 ENCOUNTER — Telehealth: Payer: Self-pay | Admitting: *Deleted

## 2015-04-13 ENCOUNTER — Inpatient Hospital Stay: Payer: 59 | Attending: Internal Medicine

## 2015-04-13 DIAGNOSIS — I4891 Unspecified atrial fibrillation: Secondary | ICD-10-CM | POA: Insufficient documentation

## 2015-04-13 DIAGNOSIS — M797 Fibromyalgia: Secondary | ICD-10-CM | POA: Insufficient documentation

## 2015-04-13 DIAGNOSIS — I10 Essential (primary) hypertension: Secondary | ICD-10-CM | POA: Diagnosis not present

## 2015-04-13 DIAGNOSIS — Z7901 Long term (current) use of anticoagulants: Secondary | ICD-10-CM | POA: Diagnosis not present

## 2015-04-13 DIAGNOSIS — R0981 Nasal congestion: Secondary | ICD-10-CM | POA: Diagnosis not present

## 2015-04-13 DIAGNOSIS — K219 Gastro-esophageal reflux disease without esophagitis: Secondary | ICD-10-CM | POA: Diagnosis not present

## 2015-04-13 DIAGNOSIS — C8330 Diffuse large B-cell lymphoma, unspecified site: Secondary | ICD-10-CM | POA: Insufficient documentation

## 2015-04-13 DIAGNOSIS — Z79899 Other long term (current) drug therapy: Secondary | ICD-10-CM | POA: Diagnosis not present

## 2015-04-13 DIAGNOSIS — Z5111 Encounter for antineoplastic chemotherapy: Secondary | ICD-10-CM | POA: Diagnosis not present

## 2015-04-13 DIAGNOSIS — Z8673 Personal history of transient ischemic attack (TIA), and cerebral infarction without residual deficits: Secondary | ICD-10-CM | POA: Diagnosis not present

## 2015-04-13 DIAGNOSIS — R062 Wheezing: Secondary | ICD-10-CM | POA: Insufficient documentation

## 2015-04-13 DIAGNOSIS — Z87891 Personal history of nicotine dependence: Secondary | ICD-10-CM | POA: Diagnosis not present

## 2015-04-13 LAB — CBC WITH DIFFERENTIAL/PLATELET
Basophils Absolute: 0 10*3/uL (ref 0–0.1)
Basophils Relative: 1 %
Eosinophils Absolute: 0.2 10*3/uL (ref 0–0.7)
Eosinophils Relative: 14 %
HCT: 31.4 % — ABNORMAL LOW (ref 40.0–52.0)
Hemoglobin: 10.7 g/dL — ABNORMAL LOW (ref 13.0–18.0)
Lymphocytes Relative: 34 %
Lymphs Abs: 0.4 10*3/uL — ABNORMAL LOW (ref 1.0–3.6)
MCH: 29.8 pg (ref 26.0–34.0)
MCHC: 34.2 g/dL (ref 32.0–36.0)
MCV: 87.3 fL (ref 80.0–100.0)
Monocytes Absolute: 0.2 10*3/uL (ref 0.2–1.0)
Monocytes Relative: 14 %
Neutro Abs: 0.4 10*3/uL — ABNORMAL LOW (ref 1.4–6.5)
Neutrophils Relative %: 37 %
Platelets: 190 10*3/uL (ref 150–440)
RBC: 3.6 MIL/uL — ABNORMAL LOW (ref 4.40–5.90)
RDW: 15.1 % — ABNORMAL HIGH (ref 11.5–14.5)
WBC: 1.1 10*3/uL — CL (ref 3.8–10.6)

## 2015-04-13 LAB — BASIC METABOLIC PANEL
Anion gap: 7 (ref 5–15)
BUN: 24 mg/dL — ABNORMAL HIGH (ref 6–20)
CO2: 28 mmol/L (ref 22–32)
Calcium: 8.8 mg/dL — ABNORMAL LOW (ref 8.9–10.3)
Chloride: 100 mmol/L — ABNORMAL LOW (ref 101–111)
Creatinine, Ser: 0.92 mg/dL (ref 0.61–1.24)
GFR calc Af Amer: >60 mL/min (ref >60)
GFR calc non Af Amer: >60 mL/min (ref >60)
Glucose, Bld: 248 mg/dL — ABNORMAL HIGH (ref 65–99)
Potassium: 4.7 mmol/L (ref 3.5–5.1)
Sodium: 135 mmol/L (ref 135–145)

## 2015-04-13 NOTE — Telephone Encounter (Signed)
-----   Message from Cammie Sickle, MD sent at 04/13/2015 12:49 PM EDT ----- Please inform patient that his white count is low/stress neutropenic precautions. Thank you

## 2015-04-13 NOTE — Telephone Encounter (Signed)
Contacted patient. Explained that his labs demonstrate neutropenia. Neutropenia precautions reviewed with patient. Pt states he has a "slight sore throat and a post nasal drip. He is taking otc anti-allergy to help this symptoms. He denies any fever or chills at this time. He is checking his temperature."  Teach back process performed.

## 2015-04-20 ENCOUNTER — Inpatient Hospital Stay: Payer: 59

## 2015-04-20 DIAGNOSIS — C8330 Diffuse large B-cell lymphoma, unspecified site: Secondary | ICD-10-CM | POA: Diagnosis not present

## 2015-04-20 LAB — CBC WITH DIFFERENTIAL/PLATELET
Basophils Absolute: 0.1 10*3/uL (ref 0–0.1)
Basophils Relative: 1 %
Eosinophils Absolute: 0.2 10*3/uL (ref 0–0.7)
Eosinophils Relative: 2 %
HCT: 32.2 % — ABNORMAL LOW (ref 40.0–52.0)
Hemoglobin: 10.7 g/dL — ABNORMAL LOW (ref 13.0–18.0)
Lymphocytes Relative: 7 %
Lymphs Abs: 0.8 10*3/uL — ABNORMAL LOW (ref 1.0–3.6)
MCH: 28.9 pg (ref 26.0–34.0)
MCHC: 33.2 g/dL (ref 32.0–36.0)
MCV: 87 fL (ref 80.0–100.0)
Monocytes Absolute: 0.9 10*3/uL (ref 0.2–1.0)
Monocytes Relative: 8 %
Neutro Abs: 9.6 10*3/uL — ABNORMAL HIGH (ref 1.4–6.5)
Neutrophils Relative %: 82 %
Platelets: 264 10*3/uL (ref 150–440)
RBC: 3.7 MIL/uL — ABNORMAL LOW (ref 4.40–5.90)
RDW: 15.8 % — ABNORMAL HIGH (ref 11.5–14.5)
WBC: 11.6 10*3/uL — ABNORMAL HIGH (ref 3.8–10.6)

## 2015-04-20 LAB — BASIC METABOLIC PANEL
Anion gap: 4 — ABNORMAL LOW (ref 5–15)
BUN: 13 mg/dL (ref 6–20)
CO2: 27 mmol/L (ref 22–32)
Calcium: 8.3 mg/dL — ABNORMAL LOW (ref 8.9–10.3)
Chloride: 103 mmol/L (ref 101–111)
Creatinine, Ser: 0.92 mg/dL (ref 0.61–1.24)
GFR calc Af Amer: >60 mL/min (ref >60)
GFR calc non Af Amer: >60 mL/min (ref >60)
Glucose, Bld: 161 mg/dL — ABNORMAL HIGH (ref 65–99)
Potassium: 4.1 mmol/L (ref 3.5–5.1)
Sodium: 134 mmol/L — ABNORMAL LOW (ref 135–145)

## 2015-04-22 ENCOUNTER — Telehealth: Payer: Self-pay | Admitting: Hematology and Oncology

## 2015-04-22 NOTE — Telephone Encounter (Signed)
Re:  cough and toenail problem  Pt called about respiratory symptoms for 3 weeks.  He notes runny nose and chronic cough.  He describes phlegm (clear/white).  He has been taking Coricidin q 8 hour.  He notes allergies.  He is concerned as he is not getting any better.  He denies any fever.  He has chemotherapy this week and would like to be seen on 04/23/2015.  He is going to try Mucinex.  He also notes some changes in his toenails due to chemotherapy.  He is unsure if he has an infection.  His toe is not red or tender.  Patient to call if fever of any concerns.  Lequita Asal, MD

## 2015-04-23 ENCOUNTER — Ambulatory Visit
Admission: RE | Admit: 2015-04-23 | Discharge: 2015-04-23 | Disposition: A | Discharge status: Home / Self Care | Payer: 59 | Source: Ambulatory Visit | Attending: Internal Medicine | Admitting: Internal Medicine

## 2015-04-23 ENCOUNTER — Telehealth: Payer: Self-pay | Admitting: *Deleted

## 2015-04-23 ENCOUNTER — Inpatient Hospital Stay (HOSPITAL_BASED_OUTPATIENT_CLINIC_OR_DEPARTMENT_OTHER): Payer: 59 | Admitting: Internal Medicine

## 2015-04-23 VITALS — BP 122/74 | HR 104 | Temp 100.1°F | Resp 22 | Wt 244.0 lb

## 2015-04-23 DIAGNOSIS — R062 Wheezing: Secondary | ICD-10-CM | POA: Insufficient documentation

## 2015-04-23 DIAGNOSIS — C8330 Diffuse large B-cell lymphoma, unspecified site: Secondary | ICD-10-CM

## 2015-04-23 DIAGNOSIS — R0981 Nasal congestion: Secondary | ICD-10-CM | POA: Diagnosis not present

## 2015-04-23 DIAGNOSIS — R059 Cough, unspecified: Secondary | ICD-10-CM

## 2015-04-23 DIAGNOSIS — R05 Cough: Secondary | ICD-10-CM | POA: Diagnosis present

## 2015-04-23 DIAGNOSIS — Z79899 Other long term (current) drug therapy: Secondary | ICD-10-CM

## 2015-04-23 DIAGNOSIS — Z959 Presence of cardiac and vascular implant and graft, unspecified: Secondary | ICD-10-CM | POA: Insufficient documentation

## 2015-04-23 MED ORDER — IPRATROPIUM-ALBUTEROL 20-100 MCG/ACT IN AERS: 1.0000 | INHALATION_SPRAY | Freq: Four times a day (QID) | RESPIRATORY_TRACT | Status: DC

## 2015-04-23 NOTE — Telephone Encounter (Signed)
Pt to get CXR then come to see NP per Dr B Pt agrees to come at 69 for CXR then see Trish at 2

## 2015-04-23 NOTE — Progress Notes (Signed)
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 He is still taking Mucinex for his sinus congestion.   No nausea no vomiting. Appetite is good. Patient denies any tingling and numbness in the hand and feet.  His appetite is good. Is not losing any weight. No abdominal pain.  No chest pain or shortness of breath. Mild cough.   REVIEW OF SYSTEMS:  A complete 10 point review of system is done which is negative except mentioned above/history of present illness.   PAST MEDICAL HISTORY :  Past Medical History  Diagnosis Date  . GERD (gastroesophageal reflux disease)   . Osteoarthrosis, unspecified whether generalized or localized, lower leg   . H/O Bell's palsy   . History of [place] measles   . Atrial fibrillation ([place])   . Hypertension   . Mini stroke ([place])   . Sixth cranial nerve palsy   . Fibromyalgia     PAST SURGICAL HISTORY :   Past Surgical History  Procedure Laterality Date  . Cyst excision    . Trigger finger release    . Ercp N/A 12/29/2014    Procedure: ENDOSCOPIC RETROGRADE CHOLANGIOPANCREATOGRAPHY (ERCP);  Surgeon: Hulen Luster, MD;  Location: [facility] ENDOSCOPY;  Service: Endoscopy;  Laterality: N/A;  . Peripheral vascular catheterization N/A 01/31/2015    Procedure: Porta Cath Insertion;  Surgeon: Katha Cabal, MD;  Location: [place] CV LAB;  Service:  Cardiovascular;  Laterality: N/A;  . Ercp N/A 02/21/2015    Procedure: ENDOSCOPIC RETROGRADE CHOLANGIOPANCREATOGRAPHY (ERCP);  Surgeon: Hulen Luster, MD;  Location: [facility] ENDOSCOPY;  Service: Gastroenterology;  Laterality: N/A;    FAMILY HISTORY :   Family History  Problem Relation Age of Onset  . Hypertension Mother     SOCIAL HISTORY:   Social History  Substance Use Topics  . Smoking status: Former Smoker -- 4.50 packs/day for 15 years    Types: Cigarettes  . Smokeless tobacco: Not on file  . Alcohol Use: No    ALLERGIES:  is allergic to celebrex and penicillins.  MEDICATIONS:  Current Outpatient Prescriptions  Medication Sig Dispense Refill  . acetaminophen (TYLENOL) 500 MG tablet Take 1,000 mg by mouth every 6 (six) hours as needed.    . B Complex-C (SUPER B COMPLEX PO) Take by mouth daily.    . Chlorphen-Pseudoephed-APAP (CORICIDIN D PO) Take by mouth.    . diltiazem (TAZTIA XT) 360 MG 24 hr capsule TAKE 1 CAPSULE BY MOUTH EVERY DAY 30 capsule 11  . lidocaine-prilocaine (EMLA) cream Apply cream 1 hour before chemotherapy treatment and place saran wrap over the cream to protect clothing 30 g 1  . loratadine (CLARITIN) 10 MG tablet Take 10 mg by mouth daily.    Marland Kitchen losartan (COZAAR) 100 MG tablet Take 1 tablet (100 mg total) by mouth daily. 90 tablet 3  .  magnesium oxide (MAG-OX) 400 MG tablet Take 400 mg by mouth daily.    Marland Kitchen nystatin (MYCOSTATIN) 100000 UNIT/ML suspension Take 5 mLs (500,000 Units total) by mouth 4 (four) times daily as needed. 480 mL 1  . omeprazole (PRILOSEC) 20 MG capsule Take 1 capsule (20 mg total) by mouth daily. 30 capsule 2  . ondansetron (ZOFRAN) 8 MG tablet Take 1 tablet (8 mg total) by mouth every 8 (eight) hours. Start on D3 of each cycle, continue x7d, then use as needed every 8 hrs 30 tablet 2  . predniSONE (DELTASONE) 20 MG tablet Take 4 tablets daily with food, for first week of treatment of chemo. 60 tablet 0  . prochlorperazine (COMPAZINE) 10 MG  tablet Take 1 tablet (10 mg total) by mouth every 6 (six) hours as needed for nausea or vomiting. Use between zofran 60 tablet 2  . XARELTO 20 MG TABS tablet TAKE 1 TABLET BY MOUTH EVERY DAY WITH DINNER 30 tablet 6  . Ipratropium-Albuterol (COMBIVENT) 20-100 MCG/ACT AERS respimat Inhale 1 puff into the lungs every 6 (six) hours. 1 Inhaler 0   No current facility-administered medications for this visit.    PHYSICAL EXAMINATION: ECOG PERFORMANCE STATUS: 0 - Asymptomatic  BP 122/74 mmHg  Pulse 104  Temp(Src) 100.1 F (37.8 C) (Oral)  Resp 22  Wt 244 lb 0.8 oz (110.7 kg)  Filed Weights   04/23/15 1342  Weight: 244 lb 0.8 oz (110.7 kg)    GENERAL: Well-nourished well-developed; Alert, no distress and comfortable.  Alone. EYES: no pallor or icterus OROPHARYNX: no thrush or ulceration; good dentition  NECK: supple, no masses felt LYMPH:  no palpable lymphadenopathy in the cervical, axillary or inguinal regions LUNGS: clear to auscultation and  No wheeze or crackles HEART/CVS: regular rate & rhythm and no murmurs; No lower extremity edema ABDOMEN:abdomen soft, non-tender and normal bowel sounds Musculoskeletal:no cyanosis of digits and no clubbing  PSYCH: alert & oriented x 3 with fluent speech NEURO: no focal motor/sensory deficits SKIN:  Toenail on right great toe loose, no pain or signs of infection.  LABORATORY DATA:  I have reviewed the data as listed    Component Value Date/Time   NA 134* 04/20/2015 0957   NA 139 12/19/2011 1044   K 4.1 04/20/2015 0957   K 4.1 12/19/2011 1044   CL 103 04/20/2015 0957   CL 106 12/19/2011 1044   CO2 27 04/20/2015 0957   CO2 26 12/19/2011 1044   GLUCOSE 161* 04/20/2015 0957   GLUCOSE 95 12/19/2011 1044   BUN 13 04/20/2015 0957   BUN 18 12/19/2011 1044   CREATININE 0.92 04/20/2015 0957   CREATININE 1.06 12/19/2011 1044   CALCIUM 8.3* 04/20/2015 0957   CALCIUM 9.0 12/19/2011 1044   PROT 6.9 04/06/2015 0816   PROT 9.0* 12/19/2011 1044    ALBUMIN 3.7 04/06/2015 0816   ALBUMIN 4.4 12/19/2011 1044   AST 21 04/06/2015 0816   AST 44* 12/19/2011 1044   ALT 18 04/06/2015 0816   ALT 46 12/19/2011 1044   ALKPHOS 97 04/06/2015 0816   ALKPHOS 110 12/19/2011 1044   BILITOT 0.4 04/06/2015 0816   BILITOT 0.7 12/19/2011 1044   GFRNONAA >60 04/20/2015 0957   GFRNONAA >60 12/19/2011 1044   GFRAA >60 04/20/2015 0957   GFRAA >60 12/19/2011 1044    No results found for: SPEP, UPEP  Lab Results  Component Value Date   WBC 11.6* 04/20/2015   NEUTROABS 9.6* 04/20/2015   HGB 10.7* 04/20/2015  HCT 32.2* 04/20/2015   MCV 87.0 04/20/2015   PLT 264 04/20/2015      Chemistry      Component Value Date/Time   NA 134* 04/20/2015 0957   NA 139 12/19/2011 1044   K 4.1 04/20/2015 0957   K 4.1 12/19/2011 1044   CL 103 04/20/2015 0957   CL 106 12/19/2011 1044   CO2 27 04/20/2015 0957   CO2 26 12/19/2011 1044   BUN 13 04/20/2015 0957   BUN 18 12/19/2011 1044   CREATININE 0.92 04/20/2015 0957   CREATININE 1.06 12/19/2011 1044      Component Value Date/Time   CALCIUM 8.3* 04/20/2015 0957   CALCIUM 9.0 12/19/2011 1044   ALKPHOS 97 04/06/2015 0816   ALKPHOS 110 12/19/2011 1044   AST 21 04/06/2015 0816   AST 44* 12/19/2011 1044   ALT 18 04/06/2015 0816   ALT 46 12/19/2011 1044   BILITOT 0.4 04/06/2015 0816   BILITOT 0.7 12/19/2011 1044       ASSESSMENT & PLAN:   # DLBCL of pancreatic head-  Stage IE.  Currently status post 4 cycles of R CHOP chemotherapy.  Tolerating chemotherapy extremely well. Complete resolution of the pancreatic head uptake. I also discussed with radiation oncology; Dr. Donella Stade who feels given the small primary/good response to PET scan- continue with systemic therapy. Patient tolerating chemotherapy extremely well. Keep appointment for this Friday for cycle 5.   # CXR for wheezing shows no pneumonia. Albuterol inhaler use q 4-6 hours PRN called to pharmacy today.  # Sinus congestion continue  antihistamine as needed.   # Great Toe - No signs of infection noted. Use OTC triple antibiotic ointment and epson salt soaks.   # 25 minutes face-to-face with the patient discussing the above plan of care; more than 50% of time spent on prognosis/ natural history; counseling and coordination.     Mayra Reel, NP 04/23/2015 2:00 PM

## 2015-04-23 NOTE — Telephone Encounter (Signed)
Reports that he is no better from this weekend when called, he does report that he is wheezing and has concerns that this has been going on for 3 weeks now. He did take 1 Mucinex and continues to take Coricidin. States that he has an odor from his toe. Please advise, he was asking to be seen today when he called [facility] this weekend. He has chemo scheduled on Friday

## 2015-04-27 ENCOUNTER — Other Ambulatory Visit: Payer: Self-pay | Admitting: *Deleted

## 2015-04-27 ENCOUNTER — Inpatient Hospital Stay: Payer: 59

## 2015-04-27 ENCOUNTER — Telehealth: Payer: Self-pay | Admitting: *Deleted

## 2015-04-27 ENCOUNTER — Inpatient Hospital Stay (HOSPITAL_BASED_OUTPATIENT_CLINIC_OR_DEPARTMENT_OTHER): Payer: 59 | Admitting: Internal Medicine

## 2015-04-27 VITALS — Temp 99.3°F

## 2015-04-27 VITALS — BP 123/78 | HR 90 | Temp 99.1°F | Resp 18 | Wt 245.2 lb

## 2015-04-27 DIAGNOSIS — C8330 Diffuse large B-cell lymphoma, unspecified site: Secondary | ICD-10-CM

## 2015-04-27 DIAGNOSIS — Z9221 Personal history of antineoplastic chemotherapy: Secondary | ICD-10-CM

## 2015-04-27 DIAGNOSIS — Z79899 Other long term (current) drug therapy: Secondary | ICD-10-CM | POA: Diagnosis not present

## 2015-04-27 DIAGNOSIS — Z9189 Other specified personal risk factors, not elsewhere classified: Secondary | ICD-10-CM

## 2015-04-27 LAB — COMPREHENSIVE METABOLIC PANEL
ALT: 18 U/L (ref 17–63)
AST: 28 U/L (ref 15–41)
Albumin: 3.7 g/dL (ref 3.5–5.0)
Alkaline Phosphatase: 100 U/L (ref 38–126)
Anion gap: 5 (ref 5–15)
BUN: 20 mg/dL (ref 6–20)
CO2: 24 mmol/L (ref 22–32)
Calcium: 8.7 mg/dL — ABNORMAL LOW (ref 8.9–10.3)
Chloride: 109 mmol/L (ref 101–111)
Creatinine, Ser: 0.91 mg/dL (ref 0.61–1.24)
GFR calc Af Amer: >60 mL/min (ref >60)
GFR calc non Af Amer: >60 mL/min (ref >60)
Glucose, Bld: 142 mg/dL — ABNORMAL HIGH (ref 65–99)
Potassium: 4 mmol/L (ref 3.5–5.1)
Sodium: 138 mmol/L (ref 135–145)
Total Bilirubin: 0.4 mg/dL (ref 0.3–1.2)
Total Protein: 6.7 g/dL (ref 6.5–8.1)

## 2015-04-27 LAB — CBC WITH DIFFERENTIAL/PLATELET
Basophils Absolute: 0.4 10*3/uL — ABNORMAL HIGH (ref 0–0.1)
Basophils Relative: 4 %
Eosinophils Absolute: 0.3 10*3/uL (ref 0–0.7)
Eosinophils Relative: 2 %
HCT: 30.6 % — ABNORMAL LOW (ref 40.0–52.0)
Hemoglobin: 10.4 g/dL — ABNORMAL LOW (ref 13.0–18.0)
Lymphocytes Relative: 13 %
Lymphs Abs: 1.5 10*3/uL (ref 1.0–3.6)
MCH: 29 pg (ref 26.0–34.0)
MCHC: 33.8 g/dL (ref 32.0–36.0)
MCV: 85.9 fL (ref 80.0–100.0)
Monocytes Absolute: 1.6 10*3/uL — ABNORMAL HIGH (ref 0.2–1.0)
Monocytes Relative: 14 %
Neutro Abs: 7.7 10*3/uL — ABNORMAL HIGH (ref 1.4–6.5)
Neutrophils Relative %: 67 %
Platelets: 463 10*3/uL — ABNORMAL HIGH (ref 150–440)
RBC: 3.56 MIL/uL — ABNORMAL LOW (ref 4.40–5.90)
RDW: 16 % — ABNORMAL HIGH (ref 11.5–14.5)
WBC: 11.5 10*3/uL — ABNORMAL HIGH (ref 3.8–10.6)

## 2015-04-27 MED ORDER — SODIUM CHLORIDE 0.9 % IV SOLN
375.0000 mg/m2 | Freq: Once | INTRAVENOUS | Status: AC
  Administered 2015-04-27: 900 mg via INTRAVENOUS
  Filled 2015-04-27: qty 80

## 2015-04-27 MED ORDER — SODIUM CHLORIDE 0.9 % IJ SOLN
10.0000 mL | INTRAMUSCULAR | Status: DC | PRN
  Administered 2015-04-27: 10 mL
  Filled 2015-04-27: qty 10

## 2015-04-27 MED ORDER — ACETAMINOPHEN 325 MG PO TABS
650.0000 mg | ORAL_TABLET | Freq: Once | ORAL | Status: AC
  Administered 2015-04-27: 650 mg via ORAL
  Filled 2015-04-27: qty 2

## 2015-04-27 MED ORDER — HEPARIN SOD (PORK) LOCK FLUSH 100 UNIT/ML IV SOLN
500.0000 [IU] | Freq: Once | INTRAVENOUS | Status: AC | PRN
  Administered 2015-04-27: 500 [IU]
  Filled 2015-04-27: qty 5

## 2015-04-27 MED ORDER — SODIUM CHLORIDE 0.9 % IV SOLN: 375.0000 mg/m2 | Freq: Once | INTRAVENOUS | Status: DC

## 2015-04-27 MED ORDER — DOXORUBICIN HCL CHEMO IV INJECTION 2 MG/ML
50.0000 mg/m2 | Freq: Once | INTRAVENOUS | Status: AC
  Administered 2015-04-27: 116 mg via INTRAVENOUS
  Filled 2015-04-27: qty 58

## 2015-04-27 MED ORDER — PALONOSETRON HCL INJECTION 0.25 MG/5ML
0.2500 mg | Freq: Once | INTRAVENOUS | Status: AC
  Administered 2015-04-27: 0.25 mg via INTRAVENOUS
  Filled 2015-04-27: qty 5

## 2015-04-27 MED ORDER — CYCLOPHOSPHAMIDE CHEMO INJECTION 1 GM
750.0000 mg/m2 | Freq: Once | INTRAMUSCULAR | Status: AC
  Administered 2015-04-27: 1720 mg via INTRAVENOUS
  Filled 2015-04-27: qty 86

## 2015-04-27 MED ORDER — DIPHENHYDRAMINE HCL 25 MG PO CAPS
50.0000 mg | ORAL_CAPSULE | Freq: Once | ORAL | Status: AC
  Administered 2015-04-27: 50 mg via ORAL
  Filled 2015-04-27: qty 2

## 2015-04-27 MED ORDER — SODIUM CHLORIDE 0.9 % IV SOLN
2.0000 mg | Freq: Once | INTRAVENOUS | Status: AC
  Administered 2015-04-27: 2 mg via INTRAVENOUS
  Filled 2015-04-27: qty 2

## 2015-04-27 MED ORDER — SODIUM CHLORIDE 0.9 % IV SOLN
Freq: Once | INTRAVENOUS | Status: AC
  Administered 2015-04-27: 09:00:00 via INTRAVENOUS
  Filled 2015-04-27: qty 1000

## 2015-04-27 MED ORDER — SODIUM CHLORIDE 0.9 % IV SOLN
10.0000 mg | Freq: Once | INTRAVENOUS | Status: AC
  Administered 2015-04-27: 10 mg via INTRAVENOUS
  Filled 2015-04-27: qty 1

## 2015-04-27 MED ORDER — PEGFILGRASTIM 6 MG/0.6ML ~~LOC~~ PSKT
6.0000 mg | PREFILLED_SYRINGE | Freq: Once | SUBCUTANEOUS | Status: AC
  Administered 2015-04-27: 6 mg via SUBCUTANEOUS
  Filled 2015-04-27: qty 0.6

## 2015-04-27 NOTE — Telephone Encounter (Signed)
-----   Message from Charlyn Minerva, [facility] sent at 04/27/2015  9:50 AM EDT ----- Fabio Pierce,  Just FYI, we will need an EF or MUGA for this patient before his next treatment with doxorubicin.  Thanks, [facility]

## 2015-04-27 NOTE — Progress Notes (Signed)
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  Patient states that his toenails "infection is improving. He is using the triple antibiotic cream.".  He has been using inhalers; this has helped his breathing.   No nausea no vomiting. Appetite is good.Patient denies any tingling and numbness in the hand and feet.  His appetite is good. Is not losing any weight. No abdominal pain. No skin rash or discoloration of the skin.     REVIEW OF SYSTEMS:  A complete 10 point review of system is done which is negative except mentioned above/history of present illness.   PAST MEDICAL HISTORY :  Past Medical History  Diagnosis Date  . GERD (gastroesophageal reflux disease)   . Osteoarthrosis, unspecified whether generalized or localized, lower leg   . H/O Bell's palsy   . History of [place] measles   . Atrial fibrillation ([place])   . Hypertension   . Mini stroke ([place])   . Sixth cranial nerve palsy   . Fibromyalgia     PAST SURGICAL HISTORY :   Past Surgical History  Procedure Laterality Date  . Cyst excision    . Trigger finger release    . Ercp N/A 12/29/2014    Procedure: ENDOSCOPIC RETROGRADE CHOLANGIOPANCREATOGRAPHY (ERCP);  Surgeon: Hulen Luster, MD;  Location: [facility] ENDOSCOPY;  Service: Endoscopy;  Laterality: N/A;  . Peripheral vascular catheterization N/A 01/31/2015    Procedure: Porta Cath Insertion;  Surgeon: Katha Cabal, MD;  Location: [facility] CV LAB;  Service:  Cardiovascular;  Laterality: N/A;  . Ercp N/A 02/21/2015    Procedure: ENDOSCOPIC RETROGRADE CHOLANGIOPANCREATOGRAPHY (ERCP);  Surgeon: Hulen Luster, MD;  Location: [facility] ENDOSCOPY;  Service: Gastroenterology;  Laterality: N/A;    FAMILY HISTORY :   Family History  Problem Relation Age of Onset  . Hypertension Mother     SOCIAL HISTORY:   Social History  Substance Use Topics  . Smoking status: Former Smoker -- 4.50 packs/day for 15 years    Types: Cigarettes  . Smokeless tobacco: Not on file  . Alcohol Use: No    ALLERGIES:  is allergic to celebrex and penicillins.  MEDICATIONS:  Current Outpatient Prescriptions  Medication Sig Dispense Refill  . acetaminophen (TYLENOL) 500 MG tablet Take 1,000 mg by mouth every 6 (six) hours as needed.    . B Complex-C (SUPER B COMPLEX PO) Take by mouth daily.    . Chlorphen-Pseudoephed-APAP (CORICIDIN D PO) Take by mouth.    . diltiazem (TAZTIA XT) 360 MG 24 hr capsule TAKE 1 CAPSULE BY MOUTH EVERY DAY 30 capsule 11  . Ipratropium-Albuterol (COMBIVENT) 20-100 MCG/ACT AERS respimat Inhale 1 puff into the lungs every 6 (six) hours. 1 Inhaler 0  . lidocaine-prilocaine (EMLA) cream Apply cream 1 hour before chemotherapy treatment and place saran wrap over the cream to protect clothing 30 g 1  . loratadine (CLARITIN) 10 MG tablet Take 10 mg by mouth daily.    Marland Kitchen losartan (  COZAAR) 100 MG tablet Take 1 tablet (100 mg total) by mouth daily. 90 tablet 3  . magnesium oxide (MAG-OX) 400 MG tablet Take 400 mg by mouth daily.    Marland Kitchen nystatin (MYCOSTATIN) 100000 UNIT/ML suspension Take 5 mLs (500,000 Units total) by mouth 4 (four) times daily as needed. 480 mL 1  . omeprazole (PRILOSEC) 20 MG capsule Take 1 capsule (20 mg total) by mouth daily. 30 capsule 2  . ondansetron (ZOFRAN) 8 MG tablet Take 1 tablet (8 mg total) by mouth every 8 (eight) hours. Start on D3 of each cycle, continue x7d, then use as needed every 8 hrs 30 tablet 2  . predniSONE (DELTASONE) 20 MG  tablet Take 4 tablets daily with food, for first week of treatment of chemo. 60 tablet 0  . prochlorperazine (COMPAZINE) 10 MG tablet Take 1 tablet (10 mg total) by mouth every 6 (six) hours as needed for nausea or vomiting. Use between zofran 60 tablet 2  . XARELTO 20 MG TABS tablet TAKE 1 TABLET BY MOUTH EVERY DAY WITH DINNER 30 tablet 6   No current facility-administered medications for this visit.    PHYSICAL EXAMINATION: ECOG PERFORMANCE STATUS: 0 - Asymptomatic  BP 123/78 mmHg  Pulse 90  Resp 18  Wt 245 lb 2.4 oz (111.2 kg)  Filed Weights   04/27/15 0837  Weight: 245 lb 2.4 oz (111.2 kg)    GENERAL: Well-nourished well-developed; Alert, no distress and comfortable.  Accompanied by his wife.  EYES: no pallor or icterus OROPHARYNX: no thrush or ulceration; good dentition  NECK: supple, no masses felt LYMPH:  no palpable lymphadenopathy in the cervical, axillary or inguinal regions LUNGS: clear to auscultation and  No wheeze or crackles HEART/CVS: regular rate & rhythm and no murmurs; No lower extremity edema ABDOMEN:abdomen soft, non-tender and normal bowel sounds Musculoskeletal:no cyanosis of digits and no clubbing  PSYCH: alert & oriented x 3 with fluent speech NEURO: no focal motor/sensory deficits SKIN:  no rashes or significant lesions; mediport accessed.   LABORATORY DATA:  I have reviewed the data as listed    Component Value Date/Time   NA 134* 04/20/2015 0957   NA 139 12/19/2011 1044   K 4.1 04/20/2015 0957   K 4.1 12/19/2011 1044   CL 103 04/20/2015 0957   CL 106 12/19/2011 1044   CO2 27 04/20/2015 0957   CO2 26 12/19/2011 1044   GLUCOSE 161* 04/20/2015 0957   GLUCOSE 95 12/19/2011 1044   BUN 13 04/20/2015 0957   BUN 18 12/19/2011 1044   CREATININE 0.92 04/20/2015 0957   CREATININE 1.06 12/19/2011 1044   CALCIUM 8.3* 04/20/2015 0957   CALCIUM 9.0 12/19/2011 1044   PROT 6.9 04/06/2015 0816   PROT 9.0* 12/19/2011 1044   ALBUMIN 3.7 04/06/2015 0816    ALBUMIN 4.4 12/19/2011 1044   AST 21 04/06/2015 0816   AST 44* 12/19/2011 1044   ALT 18 04/06/2015 0816   ALT 46 12/19/2011 1044   ALKPHOS 97 04/06/2015 0816   ALKPHOS 110 12/19/2011 1044   BILITOT 0.4 04/06/2015 0816   BILITOT 0.7 12/19/2011 1044   GFRNONAA >60 04/20/2015 0957   GFRNONAA >60 12/19/2011 1044   GFRAA >60 04/20/2015 0957   GFRAA >60 12/19/2011 1044    No results found for: SPEP, UPEP  Lab Results  Component Value Date   WBC 11.5* 04/27/2015   NEUTROABS 7.7* 04/27/2015   HGB 10.4* 04/27/2015   HCT 30.6* 04/27/2015   MCV 85.9 04/27/2015  PLT 463* 04/27/2015      Chemistry      Component Value Date/Time   NA 134* 04/20/2015 0957   NA 139 12/19/2011 1044   K 4.1 04/20/2015 0957   K 4.1 12/19/2011 1044   CL 103 04/20/2015 0957   CL 106 12/19/2011 1044   CO2 27 04/20/2015 0957   CO2 26 12/19/2011 1044   BUN 13 04/20/2015 0957   BUN 18 12/19/2011 1044   CREATININE 0.92 04/20/2015 0957   CREATININE 1.06 12/19/2011 1044      Component Value Date/Time   CALCIUM 8.3* 04/20/2015 0957   CALCIUM 9.0 12/19/2011 1044   ALKPHOS 97 04/06/2015 0816   ALKPHOS 110 12/19/2011 1044   AST 21 04/06/2015 0816   AST 44* 12/19/2011 1044   ALT 18 04/06/2015 0816   ALT 46 12/19/2011 1044   BILITOT 0.4 04/06/2015 0816   BILITOT 0.7 12/19/2011 1044       ASSESSMENT & PLAN:   # DLBCL of pancreatic head-  Stage IE. Currently status post 4 cycles of R CHOP chemotherapy. Tolerating chemotherapy extremely well. Complete resolution of the pancreatic head uptake.   #  Proceed with total of 6 cycles of chemotherapy. Proceed with cycle #4 today; CBC CMP- Fairly unremarkable except for slightly low hemoglobin of 10.6. Patient will get on Pro.   # Toe nail paronychia- from chemo; Improved   # Biliary stent in place- plans for stent extraction and of April/May with Dr.Oh; Patient on Xarelto. He was asked to give [place] a call prior to extraction of the stent.  # Weekly CBC BMP  follow-up with me in 3 weeks for cycle #6 CBC CMP.     Cammie Sickle, MD 04/27/2015 8:42 AM

## 2015-04-27 NOTE — Telephone Encounter (Signed)
Spoke with Dr. Rogue Bussing. Orders given to schedule muga scan before next chemo tx. msg sent to cancer center scheduling.

## 2015-04-27 NOTE — Progress Notes (Signed)
Patient states his breathing is much better today after being put on Combivent.  States he is getting up clear phlegm.  Also states his great toe is better.  The nail is still hanging on, however, he is using triple antibiotic ointment on the toe.

## 2015-05-02 ENCOUNTER — Inpatient Hospital Stay: Payer: 59

## 2015-05-02 ENCOUNTER — Telehealth: Payer: Self-pay | Admitting: Cardiovascular Disease

## 2015-05-02 ENCOUNTER — Telehealth: Payer: Self-pay | Admitting: *Deleted

## 2015-05-02 DIAGNOSIS — C8330 Diffuse large B-cell lymphoma, unspecified site: Secondary | ICD-10-CM | POA: Diagnosis not present

## 2015-05-02 DIAGNOSIS — R319 Hematuria, unspecified: Secondary | ICD-10-CM

## 2015-05-02 LAB — URINALYSIS COMPLETE WITH MICROSCOPIC (ARMC ONLY)
Bacteria, UA: NONE SEEN
Bilirubin Urine: NEGATIVE
Glucose, UA: NEGATIVE mg/dL
Ketones, ur: NEGATIVE mg/dL
Leukocytes, UA: NEGATIVE
Nitrite: NEGATIVE
Protein, ur: NEGATIVE mg/dL
Specific Gravity, Urine: 1.014 (ref 1.005–1.030)
pH: 6 (ref 5.0–8.0)

## 2015-05-02 NOTE — Telephone Encounter (Signed)
Pt was told to be off his xarelto and go on Asprin   For his oncologist Dr Gretchen Portela  Phone number [phone]  Was told to do that for they found some blood in his urine.  Wanted to know if there was an issue to call him back.  Please advise.

## 2015-05-02 NOTE — Telephone Encounter (Signed)
Called to report that he spoke with on call last night due to blood in  Urine, according to L Herring, AGNP-C his stated his urine was pink tinged. Per Dr Rogue Bussing, check UA today Pt agrees to come in this morning for a urine sample

## 2015-05-02 NOTE — Telephone Encounter (Signed)
   Expand All Collapse All   Called to report that he spoke with on call last night due to blood in Urine, according to L Herring, AGNP-C his stated his urine was pink tinged. Per Dr Rogue Bussing, check UA today Pt agrees to come in this morning for a urine sample        Expand All Collapse All   Per Dr Rogue Bussing, Stop Xarelto adn start ASA 325mg  daily and have him call his cardiologist to inform him of this and reason. I called and spoke with patietn and advised of the above, he had me hold while he got a pen to write down instructions and the read them back to me. He said he will call [facility] now       Pt called Oncology today (4/26) concerning for blood in his urine. See phone note above. UA lab results in Epic for your review. He was instructed to contact cardiology. Pt had stroke in August 2015 and takes xarelto 20mg  and diltiazem 360mg  qd for afib.  Will forward to Dr. Fletcher Anon to make aware and advise.

## 2015-05-02 NOTE — Telephone Encounter (Signed)
Per Dr Rogue Bussing, Stop Xarelto adn start ASA 325mg  daily and have him call his cardiologist to inform him of this and reason. I called and spoke with patietn and advised of the above, he had me hold while he got a pen to write down instructions and the read them back to me. He said he will call [facility] now

## 2015-05-03 ENCOUNTER — Telehealth: Payer: Self-pay | Admitting: *Deleted

## 2015-05-03 ENCOUNTER — Other Ambulatory Visit: Payer: Self-pay

## 2015-05-03 MED ORDER — ASPIRIN EC 81 MG PO TBEC: 81.0000 mg | DELAYED_RELEASE_TABLET | Freq: Every day | ORAL | Status: DC

## 2015-05-03 NOTE — Telephone Encounter (Signed)
S/w pt regarding Dr. Tyrell Antonio recommendations. Pt states he took a 325mg  ASA last evening but will change to 81mg  this evening. Confirmed May 22 appt. Pt agreeable w/plan.

## 2015-05-03 NOTE — Telephone Encounter (Signed)
RN reviewed chart. Pt's cardiologist has contacted the patient with an apt and medication changes as noted. Dr. Rogue Bussing made aware.

## 2015-05-03 NOTE — Telephone Encounter (Signed)
-----   Message from Cammie Sickle, MD sent at 05/02/2015  4:50 PM EDT ----- Mason Houston- Pt taken off xarelto; because of hematuria. Started on Asprin 325 mg/dy; pervious hx of stroke. Also pt to inform his cardiologist of the change of medications.

## 2015-05-03 NOTE — Telephone Encounter (Signed)
Ok to hold Xarelto. Aspirin does not help much with A-fib . Ok to continue Aspirin but decrease dose to 81 mg daily.

## 2015-05-04 ENCOUNTER — Telehealth: Payer: Self-pay | Admitting: *Deleted

## 2015-05-04 ENCOUNTER — Inpatient Hospital Stay: Payer: 59

## 2015-05-04 DIAGNOSIS — C8330 Diffuse large B-cell lymphoma, unspecified site: Secondary | ICD-10-CM

## 2015-05-04 LAB — CBC WITH DIFFERENTIAL/PLATELET
Basophils Absolute: 0 10*3/uL (ref 0–0.1)
Basophils Relative: 2 %
Eosinophils Absolute: 0.1 10*3/uL (ref 0–0.7)
Eosinophils Relative: 25 %
HCT: 31.6 % — ABNORMAL LOW (ref 40.0–52.0)
Hemoglobin: 10.6 g/dL — ABNORMAL LOW (ref 13.0–18.0)
Lymphocytes Relative: 35 %
Lymphs Abs: 0.2 10*3/uL — ABNORMAL LOW (ref 1.0–3.6)
MCH: 28.8 pg (ref 26.0–34.0)
MCHC: 33.4 g/dL (ref 32.0–36.0)
MCV: 86.2 fL (ref 80.0–100.0)
Monocytes Absolute: 0.1 10*3/uL — ABNORMAL LOW (ref 0.2–1.0)
Monocytes Relative: 12 %
Neutro Abs: 0.1 10*3/uL — ABNORMAL LOW (ref 1.4–6.5)
Neutrophils Relative %: 26 %
Platelets: 186 10*3/uL (ref 150–440)
RBC: 3.67 MIL/uL — ABNORMAL LOW (ref 4.40–5.90)
RDW: 16 % — ABNORMAL HIGH (ref 11.5–14.5)
WBC: 0.6 10*3/uL — CL (ref 3.8–10.6)

## 2015-05-04 LAB — BASIC METABOLIC PANEL
Anion gap: 7 (ref 5–15)
BUN: 22 mg/dL — ABNORMAL HIGH (ref 6–20)
CO2: 32 mmol/L (ref 22–32)
Calcium: 8.8 mg/dL — ABNORMAL LOW (ref 8.9–10.3)
Chloride: 99 mmol/L — ABNORMAL LOW (ref 101–111)
Creatinine, Ser: 0.96 mg/dL (ref 0.61–1.24)
GFR calc Af Amer: >60 mL/min (ref >60)
GFR calc non Af Amer: >60 mL/min (ref >60)
Glucose, Bld: 156 mg/dL — ABNORMAL HIGH (ref 65–99)
Potassium: 4.6 mmol/L (ref 3.5–5.1)
Sodium: 138 mmol/L (ref 135–145)

## 2015-05-04 NOTE — Telephone Encounter (Signed)
Critical ANC - 0.1  MD notified.

## 2015-05-07 ENCOUNTER — Emergency Department: Payer: 59

## 2015-05-07 ENCOUNTER — Other Ambulatory Visit: Payer: Self-pay

## 2015-05-07 ENCOUNTER — Telehealth: Payer: Self-pay | Admitting: *Deleted

## 2015-05-07 ENCOUNTER — Inpatient Hospital Stay
Admission: EM | Admit: 2015-05-07 | Discharge: 2015-05-10 | DRG: 193 | Disposition: A | Discharge status: Home / Self Care | Payer: 59 | Attending: Internal Medicine | Admitting: Internal Medicine

## 2015-05-07 DIAGNOSIS — R0902 Hypoxemia: Secondary | ICD-10-CM | POA: Diagnosis present

## 2015-05-07 DIAGNOSIS — J189 Pneumonia, unspecified organism: Secondary | ICD-10-CM | POA: Diagnosis present

## 2015-05-07 DIAGNOSIS — I10 Essential (primary) hypertension: Secondary | ICD-10-CM | POA: Diagnosis present

## 2015-05-07 DIAGNOSIS — Z9889 Other specified postprocedural states: Secondary | ICD-10-CM

## 2015-05-07 DIAGNOSIS — K831 Obstruction of bile duct: Secondary | ICD-10-CM | POA: Diagnosis present

## 2015-05-07 DIAGNOSIS — G4733 Obstructive sleep apnea (adult) (pediatric): Secondary | ICD-10-CM | POA: Diagnosis present

## 2015-05-07 DIAGNOSIS — Z9221 Personal history of antineoplastic chemotherapy: Secondary | ICD-10-CM | POA: Diagnosis not present

## 2015-05-07 DIAGNOSIS — M797 Fibromyalgia: Secondary | ICD-10-CM | POA: Diagnosis present

## 2015-05-07 DIAGNOSIS — K219 Gastro-esophageal reflux disease without esophagitis: Secondary | ICD-10-CM | POA: Diagnosis present

## 2015-05-07 DIAGNOSIS — Z79899 Other long term (current) drug therapy: Secondary | ICD-10-CM | POA: Diagnosis not present

## 2015-05-07 DIAGNOSIS — Z8249 Family history of ischemic heart disease and other diseases of the circulatory system: Secondary | ICD-10-CM | POA: Diagnosis not present

## 2015-05-07 DIAGNOSIS — H4920 Sixth [abducent] nerve palsy, unspecified eye: Secondary | ICD-10-CM | POA: Diagnosis present

## 2015-05-07 DIAGNOSIS — Z888 Allergy status to other drugs, medicaments and biological substances status: Secondary | ICD-10-CM | POA: Diagnosis not present

## 2015-05-07 DIAGNOSIS — C8330 Diffuse large B-cell lymphoma, unspecified site: Secondary | ICD-10-CM | POA: Diagnosis present

## 2015-05-07 DIAGNOSIS — Z87891 Personal history of nicotine dependence: Secondary | ICD-10-CM | POA: Diagnosis not present

## 2015-05-07 DIAGNOSIS — G510 Bell's palsy: Secondary | ICD-10-CM | POA: Diagnosis present

## 2015-05-07 DIAGNOSIS — Z880 Allergy status to penicillin: Secondary | ICD-10-CM

## 2015-05-07 DIAGNOSIS — M179 Osteoarthritis of knee, unspecified: Secondary | ICD-10-CM | POA: Diagnosis present

## 2015-05-07 DIAGNOSIS — R319 Hematuria, unspecified: Secondary | ICD-10-CM | POA: Diagnosis present

## 2015-05-07 DIAGNOSIS — Z7982 Long term (current) use of aspirin: Secondary | ICD-10-CM

## 2015-05-07 DIAGNOSIS — J9601 Acute respiratory failure with hypoxia: Secondary | ICD-10-CM

## 2015-05-07 DIAGNOSIS — Z8673 Personal history of transient ischemic attack (TIA), and cerebral infarction without residual deficits: Secondary | ICD-10-CM

## 2015-05-07 DIAGNOSIS — R000 Tachycardia, unspecified: Secondary | ICD-10-CM | POA: Diagnosis present

## 2015-05-07 DIAGNOSIS — I482 Chronic atrial fibrillation: Secondary | ICD-10-CM | POA: Diagnosis present

## 2015-05-07 HISTORY — DX: Systemic involvement of connective tissue, unspecified: M35.9

## 2015-05-07 HISTORY — DX: Malignant (primary) neoplasm, unspecified: C80.1

## 2015-05-07 LAB — COMPREHENSIVE METABOLIC PANEL
ALT: 21 U/L (ref 17–63)
AST: 21 U/L (ref 15–41)
Albumin: 3.4 g/dL — ABNORMAL LOW (ref 3.5–5.0)
Alkaline Phosphatase: 105 U/L (ref 38–126)
Anion gap: 10 (ref 5–15)
BUN: 13 mg/dL (ref 6–20)
CO2: 23 mmol/L (ref 22–32)
Calcium: 8.6 mg/dL — ABNORMAL LOW (ref 8.9–10.3)
Chloride: 101 mmol/L (ref 101–111)
Creatinine, Ser: 1.06 mg/dL (ref 0.61–1.24)
GFR calc Af Amer: >60 mL/min (ref >60)
GFR calc non Af Amer: >60 mL/min (ref >60)
Glucose, Bld: 133 mg/dL — ABNORMAL HIGH (ref 65–99)
Potassium: 4.2 mmol/L (ref 3.5–5.1)
Sodium: 134 mmol/L — ABNORMAL LOW (ref 135–145)
Total Bilirubin: 0.6 mg/dL (ref 0.3–1.2)
Total Protein: 6.5 g/dL (ref 6.5–8.1)

## 2015-05-07 LAB — URINALYSIS COMPLETE WITH MICROSCOPIC (ARMC ONLY)
Bacteria, UA: NONE SEEN
Bilirubin Urine: NEGATIVE
Glucose, UA: NEGATIVE mg/dL
Hgb urine dipstick: NEGATIVE
Ketones, ur: NEGATIVE mg/dL
Leukocytes, UA: NEGATIVE
Nitrite: NEGATIVE
Protein, ur: NEGATIVE mg/dL
Specific Gravity, Urine: 1.024 (ref 1.005–1.030)
pH: 7 (ref 5.0–8.0)

## 2015-05-07 LAB — CBC WITH DIFFERENTIAL/PLATELET
Band Neutrophils: 3 %
Basophils Absolute: 0.1 10*3/uL (ref 0–0.1)
Basophils Relative: 1 %
Blasts: 0 %
Eosinophils Absolute: 0.3 10*3/uL (ref 0–0.7)
Eosinophils Relative: 3 %
HCT: 32.1 % — ABNORMAL LOW (ref 40.0–52.0)
Hemoglobin: 10.8 g/dL — ABNORMAL LOW (ref 13.0–18.0)
Lymphocytes Relative: 9 %
Lymphs Abs: 0.8 10*3/uL — ABNORMAL LOW (ref 1.0–3.6)
MCH: 28.8 pg (ref 26.0–34.0)
MCHC: 33.6 g/dL (ref 32.0–36.0)
MCV: 85.8 fL (ref 80.0–100.0)
Metamyelocytes Relative: 0 %
Monocytes Absolute: 0.5 10*3/uL (ref 0.2–1.0)
Monocytes Relative: 6 %
Myelocytes: 1 %
Neutro Abs: 6.8 10*3/uL — ABNORMAL HIGH (ref 1.4–6.5)
Neutrophils Relative %: 77 %
Other: 0 %
Platelets: 179 10*3/uL (ref 150–440)
Promyelocytes Absolute: 0 %
RBC: 3.74 MIL/uL — ABNORMAL LOW (ref 4.40–5.90)
RDW: 16.2 % — ABNORMAL HIGH (ref 11.5–14.5)
WBC: 8.5 10*3/uL (ref 3.8–10.6)
nRBC: 0 /100 WBC

## 2015-05-07 LAB — PROTIME-INR
INR: 1.13
Prothrombin Time: 14.7 seconds (ref 11.4–15.0)

## 2015-05-07 LAB — APTT: aPTT: 40 seconds — ABNORMAL HIGH (ref 24–36)

## 2015-05-07 LAB — LACTIC ACID, PLASMA
Lactic Acid, Venous: 1.8 mmol/L (ref 0.5–2.0)
Lactic Acid, Venous: 0.9 mmol/L (ref 0.5–2.0)

## 2015-05-07 MED ORDER — LORATADINE 10 MG PO TABS
10.0000 mg | ORAL_TABLET | Freq: Every day | ORAL | Status: DC
  Administered 2015-05-08 – 2015-05-10 (×3): 10 mg via ORAL
  Filled 2015-05-07 (×3): qty 1

## 2015-05-07 MED ORDER — DILTIAZEM HCL ER BEADS 120 MG PO CP24
360.0000 mg | ORAL_CAPSULE | Freq: Every day | ORAL | Status: DC
  Filled 2015-05-07: qty 1

## 2015-05-07 MED ORDER — ONDANSETRON HCL 4 MG/2ML IJ SOLN: 4.0000 mg | Freq: Four times a day (QID) | INTRAMUSCULAR | Status: DC | PRN

## 2015-05-07 MED ORDER — LEVOFLOXACIN IN D5W 750 MG/150ML IV SOLN
750.0000 mg | INTRAVENOUS | Status: DC
  Filled 2015-05-07: qty 150

## 2015-05-07 MED ORDER — ACETAMINOPHEN 650 MG RE SUPP: 650.0000 mg | Freq: Four times a day (QID) | RECTAL | Status: DC | PRN

## 2015-05-07 MED ORDER — ACETAMINOPHEN 325 MG PO TABS
650.0000 mg | ORAL_TABLET | Freq: Four times a day (QID) | ORAL | Status: DC | PRN
  Administered 2015-05-10: 650 mg via ORAL
  Filled 2015-05-07: qty 2

## 2015-05-07 MED ORDER — SODIUM CHLORIDE 0.9 % IV BOLUS (SEPSIS)
2000.0000 mL | Freq: Once | INTRAVENOUS | Status: AC
  Administered 2015-05-07: 2000 mL via INTRAVENOUS

## 2015-05-07 MED ORDER — ONDANSETRON HCL 4 MG PO TABS: 8.0000 mg | ORAL_TABLET | Freq: Three times a day (TID) | ORAL | Status: DC

## 2015-05-07 MED ORDER — LEVOFLOXACIN IN D5W 750 MG/150ML IV SOLN
750.0000 mg | INTRAVENOUS | Status: DC
  Administered 2015-05-08 – 2015-05-09 (×2): 750 mg via INTRAVENOUS
  Filled 2015-05-07 (×3): qty 150

## 2015-05-07 MED ORDER — MAGNESIUM OXIDE 400 (241.3 MG) MG PO TABS
400.0000 mg | ORAL_TABLET | Freq: Every day | ORAL | Status: DC
  Administered 2015-05-07 – 2015-05-10 (×4): 400 mg via ORAL
  Filled 2015-05-07 (×4): qty 1

## 2015-05-07 MED ORDER — PANTOPRAZOLE SODIUM 40 MG PO TBEC
40.0000 mg | DELAYED_RELEASE_TABLET | Freq: Every day | ORAL | Status: DC
  Administered 2015-05-07 – 2015-05-10 (×4): 40 mg via ORAL
  Filled 2015-05-07 (×4): qty 1

## 2015-05-07 MED ORDER — LEVOFLOXACIN IN D5W 750 MG/150ML IV SOLN
750.0000 mg | Freq: Once | INTRAVENOUS | Status: AC
  Administered 2015-05-07: 750 mg via INTRAVENOUS
  Filled 2015-05-07: qty 150

## 2015-05-07 MED ORDER — IOPAMIDOL (ISOVUE-370) INJECTION 76%
100.0000 mL | Freq: Once | INTRAVENOUS | Status: AC | PRN
  Administered 2015-05-07: 100 mL via INTRAVENOUS

## 2015-05-07 MED ORDER — PROCHLORPERAZINE MALEATE 10 MG PO TABS
10.0000 mg | ORAL_TABLET | Freq: Four times a day (QID) | ORAL | Status: DC | PRN
  Filled 2015-05-07: qty 1

## 2015-05-07 MED ORDER — ENOXAPARIN SODIUM 40 MG/0.4ML ~~LOC~~ SOLN
40.0000 mg | SUBCUTANEOUS | Status: DC
  Filled 2015-05-07 (×2): qty 0.4

## 2015-05-07 MED ORDER — ASPIRIN EC 81 MG PO TBEC
81.0000 mg | DELAYED_RELEASE_TABLET | Freq: Every day | ORAL | Status: DC
  Administered 2015-05-07 – 2015-05-10 (×4): 81 mg via ORAL
  Filled 2015-05-07 (×4): qty 1

## 2015-05-07 MED ORDER — LOSARTAN POTASSIUM 50 MG PO TABS
100.0000 mg | ORAL_TABLET | Freq: Every day | ORAL | Status: DC
  Administered 2015-05-07 – 2015-05-08 (×2): 100 mg via ORAL
  Filled 2015-05-07 (×2): qty 2

## 2015-05-07 MED ORDER — ONDANSETRON HCL 4 MG PO TABS: 4.0000 mg | ORAL_TABLET | Freq: Four times a day (QID) | ORAL | Status: DC | PRN

## 2015-05-07 NOTE — H&P (Signed)
[facility] [place] NAME: Mason Houston    MR#:  [id number]  DATE OF BIRTH:  September 23, 1950  DATE OF ADMISSION:  05/07/2015  PRIMARY CARE PHYSICIAN: Keith Rake, MD   REQUESTING/REFERRING PHYSICIAN: Dr. Joni Fears  CHIEF COMPLAINT:   Increasing shortness of breath for 2-3 days HISTORY OF PRESENT ILLNESS:  [name]  is a 65 y.o. male with a known history of diffuse large B cell lymphoma, GERD, A. fib chronic was on Xarelto now off of it since patient developed hematuria a few days ago, hypertension, TIAs, obstructive jaundice status post stent in the CBD comes to the emergency room with increasing shortness of breath low-grade fever 100.4 cough and was found to have diffuse pneumonitis/interstitial infiltrates on CT scan.  Patient received IV Levaquin. He was found to be hypoxic with sats 88-89% on room air. She currently is on 2 L sats are 96%. His heart rate is in the 100s A. fib intermittent. Patient is being admitted for further eval showed management. Lactic acid within normal limits white count 8.5.  Patient is currently getting chemotherapy for his diffuse large B-cell lymphoma.  PAST MEDICAL HISTORY:   Past Medical History  Diagnosis Date  . GERD (gastroesophageal reflux disease)   . Osteoarthrosis, unspecified whether generalized or localized, lower leg   . H/O Bell's palsy   . History of [place] measles   . Atrial fibrillation ([place])   . Hypertension   . Mini stroke ([place])   . Sixth cranial nerve palsy   . Fibromyalgia   . Collagen vascular disease ([place])   . Cancer [facility])     PAST SURGICAL HISTOIRY:   Past Surgical History  Procedure Laterality Date  . Cyst excision    . Trigger finger release    . Ercp N/A 12/29/2014    Procedure: ENDOSCOPIC RETROGRADE CHOLANGIOPANCREATOGRAPHY (ERCP);  Surgeon: Hulen Luster, MD;  Location: [facility] ENDOSCOPY;  Service: Endoscopy;  Laterality: N/A;  . Peripheral vascular catheterization  N/A 01/31/2015    Procedure: Porta Cath Insertion;  Surgeon: Katha Cabal, MD;  Location: [place] CV LAB;  Service: Cardiovascular;  Laterality: N/A;  . Ercp N/A 02/21/2015    Procedure: ENDOSCOPIC RETROGRADE CHOLANGIOPANCREATOGRAPHY (ERCP);  Surgeon: Hulen Luster, MD;  Location: [facility] ENDOSCOPY;  Service: Gastroenterology;  Laterality: N/A;    SOCIAL HISTORY:   Social History  Substance Use Topics  . Smoking status: Former Smoker -- 4.50 packs/day for 15 years    Types: Cigarettes  . Smokeless tobacco: Not on file  . Alcohol Use: No    FAMILY HISTORY:   Family History  Problem Relation Age of Onset  . Hypertension Mother     DRUG ALLERGIES:   Allergies  Allergen Reactions  . Celebrex [Celecoxib] Hives  . Penicillins Hives and Other (See Comments)    Has patient had a PCN reaction causing immediate rash, facial/tongue/throat swelling, SOB or lightheadedness with hypotension: No Has patient had a PCN reaction causing severe rash involving mucus membranes or skin necrosis: No Has patient had a PCN reaction that required hospitalization No Has patient had a PCN reaction occurring within the last 10 years: No If all of the above answers are "NO", then may proceed with Cephalosporin use.    REVIEW OF SYSTEMS:  Review of Systems  Constitutional: Positive for fever. Negative for chills and weight loss.  HENT: Negative for ear discharge, ear pain and nosebleeds.   Eyes: Negative for blurred vision, pain and  discharge.  Respiratory: Positive for cough and shortness of breath. Negative for sputum production, wheezing and stridor.   Cardiovascular: Negative for chest pain, palpitations, orthopnea and PND.  Gastrointestinal: Negative for nausea, vomiting, abdominal pain and diarrhea.  Genitourinary: Negative for urgency and frequency.  Musculoskeletal: Negative for back pain and joint pain.  Neurological: Positive for weakness. Negative for sensory change, speech change and  focal weakness.  Psychiatric/Behavioral: Negative for depression and hallucinations. The patient is not nervous/anxious.   All other systems reviewed and are negative.    MEDICATIONS AT HOME:   Prior to Admission medications   Medication Sig Start Date End Date Taking? Authorizing Provider  diltiazem (TIAZAC) 360 MG 24 hr capsule Take 360 mg by mouth daily.   Yes Historical Provider, MD  loratadine (CLARITIN) 10 MG tablet Take 10 mg by mouth daily.   Yes Historical Provider, MD  magnesium oxide (MAG-OX) 400 (241.3 Mg) MG tablet Take 400 mg by mouth daily.   Yes Historical Provider, MD  acetaminophen (TYLENOL) 500 MG tablet Take 1,000 mg by mouth every 6 (six) hours as needed.    Historical Provider, MD  aspirin EC 81 MG tablet Take 1 tablet (81 mg total) by mouth daily. 05/03/15   Wellington Hampshire, MD  B Complex-C (SUPER B COMPLEX PO) Take by mouth daily.    Historical Provider, MD  Chlorphen-Pseudoephed-APAP (CORICIDIN D PO) Take by mouth.    Historical Provider, MD  Ipratropium-Albuterol (COMBIVENT) 20-100 MCG/ACT AERS respimat Inhale 1 puff into the lungs every 6 (six) hours. 04/23/15   Mayra Reel, NP  lidocaine-prilocaine (EMLA) cream Apply cream 1 hour before chemotherapy treatment and place saran wrap over the cream to protect clothing 01/30/15   Dmitriy Berenzon, MD  losartan (COZAAR) 100 MG tablet Take 1 tablet (100 mg total) by mouth daily. 02/05/15   Wellington Hampshire, MD  nystatin (MYCOSTATIN) 100000 UNIT/ML suspension Take 5 mLs (500,000 Units total) by mouth 4 (four) times daily as needed. 03/23/15   Cammie Sickle, MD  omeprazole (PRILOSEC) 20 MG capsule Take 1 capsule (20 mg total) by mouth daily. 03/05/15   Cammie Sickle, MD  ondansetron (ZOFRAN) 8 MG tablet Take 1 tablet (8 mg total) by mouth every 8 (eight) hours. Start on D3 of each cycle, continue x7d, then use as needed every 8 hrs 02/05/15   Dmitriy Berenzon, MD  predniSONE (DELTASONE) 20 MG tablet Take 4 tablets  daily with food, for first week of treatment of chemo. 04/06/15   Cammie Sickle, MD  prochlorperazine (COMPAZINE) 10 MG tablet Take 1 tablet (10 mg total) by mouth every 6 (six) hours as needed for nausea or vomiting. Use between zofran 02/02/15   Dmitriy Berenzon, MD      VITAL SIGNS:  Blood pressure 126/67, pulse 80, temperature 98.7 F (37.1 C), temperature source Oral, resp. rate 12, height 5\' 8"  (1.727 m), weight 108.863 kg (240 lb), SpO2 94 %.  PHYSICAL EXAMINATION:  GENERAL:  65 y.o.-year-old patient lying in the bed with no acute distress. obese EYES: Pupils equal, round, reactive to light and accommodation. No scleral icterus. Extraocular muscles intact.  HEENT: Head atraumatic, normocephalic. Oropharynx and nasopharynx clear.  NECK:  Supple, no jugular venous distention. No thyroid enlargement, no tenderness.  LUNGS: Normal breath sounds bilaterally, no wheezing, rales,rhonchi or crepitation. No use of accessory muscles of respiration.  CARDIOVASCULAR: S1, S2 normal. No murmurs, rubs, or gallops. tachycardia ABDOMEN: Soft, nontender, nondistended. Bowel sounds present. No organomegaly or mass.  EXTREMITIES: No pedal edema, cyanosis, or clubbing.  NEUROLOGIC: Cranial nerves II through XII are intact. Muscle strength 5/5 in all extremities. Sensation intact. Gait not checked.  PSYCHIATRIC: The patient is alert and oriented x 3.  SKIN: No obvious rash, lesion, or ulcer.   LABORATORY PANEL:   CBC  Recent Labs Lab 05/07/15 1311  WBC 8.5  HGB 10.8*  HCT 32.1*  PLT 179   ------------------------------------------------------------------------------------------------------------------  Chemistries   Recent Labs Lab 05/07/15 1311  NA 134*  K 4.2  CL 101  CO2 23  GLUCOSE 133*  BUN 13  CREATININE 1.06  CALCIUM 8.6*  AST 21  ALT 21  ALKPHOS 105  BILITOT 0.6   RADIOLOGY:  Dg Chest 2 View  05/07/2015  CLINICAL DATA:  Shortness of breath for 3 days.  Initial  encounter. EXAM: CHEST  2 VIEW COMPARISON:  PA and lateral chest 04/23/2015. FINDINGS: Right IJ approach Port-A-Cath is again seen. Lungs are clear. Heart size is normal. No pneumothorax or pleural effusion. Thoracic spondylosis is noted. IMPRESSION: No acute disease. Electronically Signed   By: Inge Rise M.D.   On: 05/07/2015 13:20   Ct Angio Chest Pe W/cm &/or Wo Cm  05/07/2015  CLINICAL DATA:  Pt reports shortness of breath since Saturday; reports MD changed him from Xarelto to aspirin on Wednesday of last week due to blood in urine. Pt also currently being [place] for DIFFUSE LARGE B CELL LYMPHOMA of pancreatic head; EXAM: CT ANGIOGRAPHY CHEST WITH CONTRAST TECHNIQUE: Multidetector CT imaging of the chest was performed using the standard protocol during bolus administration of intravenous contrast. Multiplanar CT image reconstructions and MIPs were obtained to evaluate the vascular anatomy. CONTRAST:  100 mL Isovue 370 COMPARISON:  05/07/2015, 03/28/2015 FINDINGS: 3 mm right upper lobe pulmonary nodule image 46 series 6. 4 mm pulmonary nodule right apex image number 21. No consolidation or pleural effusion. Minimal patchy ground-glass attenuation scattered bilaterally. No filling defects in the pulmonary arterial system. Thoracic aorta shows no dissection or dilatation. There are numerous nonpathologic mediastinal lymph nodes. There is a borderline sub carinal lymph node measuring 10 mm. There is mild LAD coronary calcification. A Port-A-Cath on the right is seen with tip extending into the right atrium. No acute abnormalities involving the musculoskeletal system. Images through the upper abdomen demonstrate dilated common bile duct with stent within it. There is air and fluid in the common bile duct, which measures 14 mm. There is pneumobilia. There is mild increased attenuation in the gallbladder fossa. There is mild gallbladder wall enhancement. Review of the MIP images confirms the above findings.  IMPRESSION: No evidence of pulmonary embolism Minimal ground-glass attenuation in both lungs a nonspecific finding. This could represent very early developing infection or inflammation but may be due to areas of mild atelectasis as well. Stable tiny pulmonary nodules compared to 01/24/2015 Pneumobilia related to common bile duct stent. Mild attenuation gallbladder fossa with gallbladder wall enhancement likely related. Electronically Signed   By: Skipper Cliche M.D.   On: 05/07/2015 14:21    EKG:  Sinus tachycardia with intermittent A. fib on telemetry  IMPRESSION AND PLAN:   Mason Houston  is a 65 y.o. male with a known history of diffuse large B cell lymphoma, GERD, A. fib chronic was on Xarelto now off of it since patient developed hematuria a few days ago, hypertension, TIAs, obstructive jaundice status post stent in the CBD comes to the emergency room with increasing shortness of breath low-grade fever 100.4 cough  and was found to have diffuse pneumonitis/interstitial infiltrates on CT scan.   1. Progressive increase in shortness of breath with hypoxia -CT scan suggesting diffuse bilateral infiltrates -Patient presented with low-grade fever 100.4., Tachycardia -Admit to medical floor -IV antibiotic with Levaquin for pneumonitis -Nebs as needed.  -Pulmonary consultation if needed -Assess for home oxygen need  2. Diffuse large B-cell lymphoma -Patient currently undergoing chemotherapy at the cancer center -Counts are stable   3. Chronic A. fib  -Patient Xarelto due to recent hematuria  -Advised to take baby aspirin by his cardiologist -Continue diltiazem   4.OSA Cont cpap  5.HTN Cont meds  6.DVT prophylaxis lovenox   All the records are reviewed and case discussed with ED provider. Management plans discussed with the patient, family and they are in agreement.  CODE STATUS:full  TOTAL TIME TAKING CARE OF THIS PATIENT: 50 minutes.    , M.D on 05/07/2015 at  3:05 PM  Between 7am to 6pm - Pager - [phone]  After 6pm go to [email or web address] - password [email or web address] [facility]  [facility]  Office  [phone]  CC: Primary care physician; Keith Rake, MD

## 2015-05-07 NOTE — ED Notes (Signed)
Pt to xray

## 2015-05-07 NOTE — ED Notes (Addendum)
Pt reports shortness of breath since Saturday; reports MD changed him from xarelto to aspirin on Wednesday of last week due to blood in urine. Pt also currently being tx for CA; last treatment was 04/27/15. Pt in triage with increased RR and effort, pale, diaphoretic and skin cool to touch. Attempted to start IV x2, unsuccessful but labs and 1 set of BC drawn. Pt reports taking 650mg  of tylenol at 0930, also took his cardia at 0730.

## 2015-05-07 NOTE — Telephone Encounter (Signed)
Per Dr Rogue Bussing patient advised to go to ER. He stated he will be there within an hour

## 2015-05-07 NOTE — Telephone Encounter (Signed)
Has been sick all weekend, runny nose temp over 101 taking tylenol and comes down to 99. He is audibly wheezing over phone and seems short of breath. Asking to be seen. He has tried Claritin and another OTC congestion med.  WBC 3.8 - 10.6 K/uL 0.6 (LL)   Comments: [facility] CRITICAL VALUE PROTOCOL  RESULT REPEATED AND VERIFIED     RBC 4.40 - 5.90 MIL/uL 3.67 (L)   Hemoglobin 13.0 - 18.0 g/dL 10.6 (L)   HCT 40.0 - 52.0 % 31.6 (L)   MCV 80.0 - 100.0 fL 86.2   MCH 26.0 - 34.0 pg 28.8   MCHC 32.0 - 36.0 g/dL 33.4   RDW 11.5 - 14.5 % 16.0 (H)   Platelets 150 - 440 K/uL 186   Neutrophils Relative % % 26   Neutro Abs 1.4 - 6.5 K/uL 0.1 (L)

## 2015-05-07 NOTE — ED Notes (Signed)
Pt to CT

## 2015-05-07 NOTE — ED Provider Notes (Signed)
[facility] Emergency Department Provider Note  ____________________________________________  Time seen: 1:10 PM  I have reviewed the triage vital signs and the nursing notes.   HISTORY  Chief Complaint Shortness of Breath and Code Sepsis    HPI Mason Houston is a 65 y.o. male who complains of worsening shortness of breath for the past 2-3 days. This is particularly worsened with exertion and finds that he is unable to walk more than a few steps without getting very short of breath. He has a history of non-Hodgkin's lymphoma, treated with chemotherapy, most recent chemotherapy infusion was about April 21. Denies cough but does also have some chest pain when taking a deep breath. He is to be on Xarelto but because he developed hematuria they discontinued it.  He is followed by Dr. Rogue Bussing from [place].  He's also had a fever as high as 100.6 over the last 48 hours. It goes down when he takes Tylenol, and then as the Tylenol wears off the fever reoccurs.     Past Medical History  Diagnosis Date  . GERD (gastroesophageal reflux disease)   . Osteoarthrosis, unspecified whether generalized or localized, lower leg   . H/O Bell's palsy   . History of [place] measles   . Atrial fibrillation ([place])   . Hypertension   . Mini stroke ([place])   . Sixth cranial nerve palsy   . Fibromyalgia   . Collagen vascular disease ([place])   . Cancer [facility])      Patient Active Problem List   Diagnosis Date Noted  . 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 B-cell lymphoma ([place]) 01/19/2015  . Pancreatic mass 01/12/2015  . Obstructive jaundice 12/28/2014  . Arthritis 06/09/2014  . Benign essential HTN 10/04/2013  . Cerebellar infarction ([place]) 10/04/2013  . Acid reflux 10/04/2013  . Arthritis, degenerative 10/04/2013  . HLD (hyperlipidemia) 10/04/2013  . H/O disease 10/04/2013  . Apnea, sleep 10/04/2013  . Type 2 diabetes mellitus ([place]) 10/04/2013   . Stroke ([place]) 09/08/2013  . Obstructive sleep apnea 02/03/2013  . Depression 07/05/2012  . Major depressive disorder with single episode ([place]) 07/05/2012  . Hyperlipemia 12/23/2011  . Atrial fibrillation ([place])   . Hypertension      Past Surgical History  Procedure Laterality Date  . Cyst excision    . Trigger finger release    . Ercp N/A 12/29/2014    Procedure: ENDOSCOPIC RETROGRADE CHOLANGIOPANCREATOGRAPHY (ERCP);  Surgeon: Hulen Luster, MD;  Location: [facility] ENDOSCOPY;  Service: Endoscopy;  Laterality: N/A;  . Peripheral vascular catheterization N/A 01/31/2015    Procedure: Porta Cath Insertion;  Surgeon: Katha Cabal, MD;  Location: [place] CV LAB;  Service: Cardiovascular;  Laterality: N/A;  . Ercp N/A 02/21/2015    Procedure: ENDOSCOPIC RETROGRADE CHOLANGIOPANCREATOGRAPHY (ERCP);  Surgeon: Hulen Luster, MD;  Location: [facility] ENDOSCOPY;  Service: Gastroenterology;  Laterality: N/A;     Current Outpatient Rx  Name  Route  Sig  Dispense  Refill  . acetaminophen (TYLENOL) 500 MG tablet   Oral   Take 1,000 mg by mouth every 6 (six) hours as needed.         Marland Kitchen aspirin EC 81 MG tablet   Oral   Take 1 tablet (81 mg total) by mouth daily.   90 tablet   3   . B Complex-C (SUPER B COMPLEX PO)   Oral   Take by mouth daily.         . Chlorphen-Pseudoephed-APAP (  CORICIDIN D PO)   Oral   Take by mouth.         . diltiazem (TAZTIA XT) 360 MG 24 hr capsule      TAKE 1 CAPSULE BY MOUTH EVERY DAY   30 capsule   11   . Ipratropium-Albuterol (COMBIVENT) 20-100 MCG/ACT AERS respimat   Inhalation   Inhale 1 puff into the lungs every 6 (six) hours.   1 Inhaler   0   . lidocaine-prilocaine (EMLA) cream      Apply cream 1 hour before chemotherapy treatment and place saran wrap over the cream to protect clothing   30 g   1   . loratadine (CLARITIN) 10 MG tablet   Oral   Take 10 mg by mouth daily.         Marland Kitchen losartan (COZAAR) 100 MG tablet   Oral   Take 1 tablet  (100 mg total) by mouth daily.   90 tablet   3   . magnesium oxide (MAG-OX) 400 MG tablet   Oral   Take 400 mg by mouth daily.         Marland Kitchen nystatin (MYCOSTATIN) 100000 UNIT/ML suspension   Oral   Take 5 mLs (500,000 Units total) by mouth 4 (four) times daily as needed.   480 mL   1   . omeprazole (PRILOSEC) 20 MG capsule   Oral   Take 1 capsule (20 mg total) by mouth daily.   30 capsule   2   . ondansetron (ZOFRAN) 8 MG tablet   Oral   Take 1 tablet (8 mg total) by mouth every 8 (eight) hours. Start on D3 of each cycle, continue x7d, then use as needed every 8 hrs   30 tablet   2   . predniSONE (DELTASONE) 20 MG tablet      Take 4 tablets daily with food, for first week of treatment of chemo.   60 tablet   0   . prochlorperazine (COMPAZINE) 10 MG tablet   Oral   Take 1 tablet (10 mg total) by mouth every 6 (six) hours as needed for nausea or vomiting. Use between zofran   60 tablet   2      Allergies Celebrex and Penicillins   Family History  Problem Relation Age of Onset  . Hypertension Mother     Social History Social History  Substance Use Topics  . Smoking status: Former Smoker -- 4.50 packs/day for 15 years    Types: Cigarettes  . Smokeless tobacco: None  . Alcohol Use: No    Review of Systems  Constitutional:   Positive fever and chills.  Eyes:   No vision changes.  ENT:   No sore throat. No rhinorrhea. Cardiovascular:   Positive as above for pleuritic chest pain. Respiratory:   Positive shortness of breath without cough. Gastrointestinal:   Negative for abdominal pain, vomiting and diarrhea.  Genitourinary:   Negative for dysuria or difficulty urinating. Musculoskeletal:   Negative for focal pain or swelling Neurological:   Negative for headaches 10-point ROS otherwise negative.  ____________________________________________   PHYSICAL EXAM:  VITAL SIGNS: ED Triage Vitals  Enc Vitals Group     BP 05/07/15 1205 125/66 mmHg      Pulse Rate 05/07/15 1205 111     Resp 05/07/15 1205 28     Temp 05/07/15 1205 98.7 F (37.1 C)     Temp Source 05/07/15 1205 Oral     SpO2 05/07/15 1205 95 %  Weight 05/07/15 1205 240 lb (108.863 kg)     Height 05/07/15 1205 5\' 8"  (1.727 m)     Head Cir --      Peak Flow --      Pain Score 05/07/15 1206 0     Pain Loc --      Pain Edu? --      Excl. in [place]? --     Vital signs reviewed, nursing assessments reviewed. Room air oxygen saturation 89% on my exam.  Constitutional:   Alert and oriented. Uncomfortable but not in distress. Eyes:   No scleral icterus. No conjunctival pallor. PERRL. EOMI.  No nystagmus. ENT   Head:   Normocephalic and atraumatic.   Nose:   No congestion/rhinnorhea. No septal hematoma   Mouth/Throat:   Dry mucous membranes, no pharyngeal erythema. No peritonsillar mass.    Neck:   No stridor. No SubQ emphysema. No meningismus. Hematological/Lymphatic/Immunilogical:   No cervical lymphadenopathy. Cardiovascular:   Tachycardia heart rate 110. Symmetric bilateral radial and DP pulses.  No murmurs.  Respiratory:   Tachypnea. Diffuse bibasilar rhonchi. Symmetric air entry in all lung fields. Gastrointestinal:   Soft and nontender. Non distended. There is no CVA tenderness.  No rebound, rigidity, or guarding. Genitourinary:   deferred Musculoskeletal:   Nontender with normal range of motion in all extremities. No joint effusions.  No lower extremity tenderness.  No edema. Neurologic:   Normal speech and language.  CN 2-10 normal. Motor grossly intact. No gross focal neurologic deficits are appreciated.  Skin:    Skin is warm, positive diaphoresis. Skin is intact.. No rash noted.  No petechiae, purpura, or bullae.  ____________________________________________    LABS (pertinent positives/negatives) (all labs ordered are listed, but only abnormal results are displayed) Labs Reviewed  URINALYSIS COMPLETEWITH MICROSCOPIC ([facility] ONLY) - Abnormal;  Notable for the following:    Color, Urine YELLOW (*)    APPearance CLEAR (*)    Squamous Epithelial / LPF 0-5 (*)    All other components within normal limits  COMPREHENSIVE METABOLIC PANEL - Abnormal; Notable for the following:    Sodium 134 (*)    Glucose, Bld 133 (*)    Calcium 8.6 (*)    Albumin 3.4 (*)    All other components within normal limits  CBC WITH DIFFERENTIAL/PLATELET - Abnormal; Notable for the following:    RBC 3.74 (*)    Hemoglobin 10.8 (*)    HCT 32.1 (*)    RDW 16.2 (*)    All other components within normal limits  APTT - Abnormal; Notable for the following:    aPTT 40 (*)    All other components within normal limits  CULTURE, BLOOD (ROUTINE X 2)  CULTURE, BLOOD (ROUTINE X 2)  URINE CULTURE  LACTIC ACID, PLASMA  PROTIME-INR  PROTIME-INR  LACTIC ACID, PLASMA  CBC WITH DIFFERENTIAL/PLATELET   ____________________________________________   EKG  Interpreted by me Sinus tachycardia rate 111, normal axis and intervals. Normal QRS ST segments and T waves.  ____________________________________________    RADIOLOGY  Chest x-ray unremarkable CT angiogram chest reveals bilateral ground glass opacity consistent with atypical pneumonia  ____________________________________________   PROCEDURES  CRITICAL CARE Performed by: Joni Fears,    Total critical care time: 35 minutes  Critical care time was exclusive of separately billable procedures and treating other patients.  Critical care was necessary to treat or prevent imminent or life-threatening deterioration.  Critical care was time spent personally by me on the following activities: development of treatment plan  with patient and/or surrogate as well as nursing, discussions with consultants, evaluation of patient's response to treatment, examination of patient, obtaining history from patient or surrogate, ordering and performing treatments and interventions, ordering and review of laboratory  studies, ordering and review of radiographic studies, pulse oximetry and re-evaluation of patient's condition.  ____________________________________________   INITIAL IMPRESSION / ASSESSMENT AND PLAN / ED COURSE  Pertinent labs & imaging results that were available during my care of the patient were reviewed by me and considered in my medical decision making (see chart for details).  Patient presents with tachycardia tachypnea and hypoxia and fever in the setting of immunosuppression and lymphoma. Highest suspicions are for pneumonia versus pulmonary embolism. Chest x-ray was unremarkable so I proceeded with CT angiogram of the chest. This was negative for PE but did reveal bilateral ground glass opacities which in this clinical context is consistent with an atypical pneumonia. The patient is given IV fluids and IV Levaquin. He appears to be mildly septic but has no evidence of end organ dysfunction at this time other than the pulmonary compromise from the airspace disease. Saturations are adequate on nasal cannula oxygen.  Discussed the case with hospitalist for admission.    ____________________________________________   FINAL CLINICAL IMPRESSION(S) / ED DIAGNOSES  Final diagnoses:  Community acquired pneumonia  Acute respiratory failure with hypoxia ([place])       Portions of this note were generated with dragon dictation software. Dictation errors may occur despite best attempts at proofreading.   Carrie Mew, MD 05/07/15 1444

## 2015-05-08 MED ORDER — DILTIAZEM HCL ER COATED BEADS 180 MG PO CP24
360.0000 mg | ORAL_CAPSULE | Freq: Every day | ORAL | Status: DC
  Administered 2015-05-08 – 2015-05-10 (×3): 360 mg via ORAL
  Filled 2015-05-08 (×3): qty 2

## 2015-05-08 MED ORDER — BUDESONIDE 0.25 MG/2ML IN SUSP
0.2500 mg | Freq: Two times a day (BID) | RESPIRATORY_TRACT | Status: DC
  Administered 2015-05-08 – 2015-05-10 (×5): 0.25 mg via RESPIRATORY_TRACT
  Filled 2015-05-08 (×5): qty 2

## 2015-05-08 MED ORDER — IPRATROPIUM-ALBUTEROL 0.5-2.5 (3) MG/3ML IN SOLN
3.0000 mL | Freq: Four times a day (QID) | RESPIRATORY_TRACT | Status: DC
  Administered 2015-05-08 – 2015-05-10 (×8): 3 mL via RESPIRATORY_TRACT
  Filled 2015-05-08 (×8): qty 3

## 2015-05-08 NOTE — Progress Notes (Signed)
Patient ID: Mason Houston, male   DOB: 1950-12-02, 65 y.o.   MRN: [id number] [employer] PROGRESS NOTE  Mason Houston [id number] DOB: July 31, 1950 DOA: 05/07/2015 PCP: Keith Rake, MD  HPI/Subjective: Patient feeling better than yesterday but still short of breath when he talks a lot or when he moves around. Some cough but not much production.  Objective: Filed Vitals:   05/08/15 1007 05/08/15 1221  BP: 115/65 111/65  Pulse: 104 114  Temp: 99.2 F (37.3 C) 98.5 F (36.9 C)  Resp: 18 20    Filed Weights   05/07/15 1205 05/07/15 1734  Weight: 108.863 kg (240 lb) 110.224 kg (243 lb)    ROS: Review of Systems  Constitutional: Negative for fever and chills.  Eyes: Negative for blurred vision.  Respiratory: Positive for cough and shortness of breath.   Cardiovascular: Negative for chest pain.  Gastrointestinal: Negative for nausea, vomiting, abdominal pain, diarrhea and constipation.  Genitourinary: Negative for dysuria.  Musculoskeletal: Negative for joint pain.  Neurological: Negative for dizziness and headaches.   Exam: Physical Exam  Constitutional: He is oriented to person, place, and time.  HENT:  Nose: No mucosal edema.  Mouth/Throat: No oropharyngeal exudate or posterior oropharyngeal edema.  Eyes: Conjunctivae, EOM and lids are normal. Pupils are equal, round, and reactive to light.  Neck: No JVD present. Carotid bruit is not present. No edema present. No thyroid mass and no thyromegaly present.  Cardiovascular: S1 normal and S2 normal.  Exam reveals no gallop.   No murmur heard. Pulses:      Dorsalis pedis pulses are 2+ on the right side, and 2+ on the left side.  Respiratory: No respiratory distress. He has decreased breath sounds in the right middle field, the right lower field, the left middle field and the left lower field. He has no wheezes. He has no rhonchi. He has no rales.  GI: Soft. Bowel sounds are normal. There is no tenderness.   Musculoskeletal:       Right ankle: He exhibits swelling.       Left ankle: He exhibits swelling.  Lymphadenopathy:    He has no cervical adenopathy.  Neurological: He is alert and oriented to person, place, and time. No cranial nerve deficit.  Skin: Skin is warm. No rash noted. Nails show no clubbing.  Psychiatric: He has a normal mood and affect.      Data Reviewed: Basic Metabolic Panel:  Recent Labs Lab 05/04/15 1019 05/07/15 1311  NA 138 134*  K 4.6 4.2  CL 99* 101  CO2 32 23  GLUCOSE 156* 133*  BUN 22* 13  CREATININE 0.96 1.06  CALCIUM 8.8* 8.6*   Liver Function Tests:  Recent Labs Lab 05/07/15 1311  AST 21  ALT 21  ALKPHOS 105  BILITOT 0.6  PROT 6.5  ALBUMIN 3.4*   CBC:  Recent Labs Lab 05/04/15 1019 05/07/15 1311  WBC 0.6* 8.5  NEUTROABS 0.1* 6.8*  HGB 10.6* 10.8*  HCT 31.6* 32.1*  MCV 86.2 85.8  PLT 186 179     Recent Results (from the past 240 hour(s))  Culture, blood (Routine x 2)     Status: None (Preliminary result)   Collection Time: 05/07/15 12:02 PM  Result Value Ref Range Status   Specimen Description BLOOD RIGHT WRIST  Final   Special Requests BOTTLES DRAWN AEROBIC AND ANAEROBIC 1ML  Final   Culture NO GROWTH < 24 HOURS  Final   Report Status PENDING  Incomplete  Culture, blood (Routine x  2)     Status: None (Preliminary result)   Collection Time: 05/07/15  1:11 PM  Result Value Ref Range Status   Specimen Description BLOOD RIGHT PORTA CATH  Final   Special Requests BOTTLES DRAWN AEROBIC AND ANAEROBIC [place]  Final   Culture NO GROWTH < 24 HOURS  Final   Report Status PENDING  Incomplete  Urine culture     Status: None (Preliminary result)   Collection Time: 05/07/15  1:11 PM  Result Value Ref Range Status   Specimen Description URINE, RANDOM  Final   Special Requests NONE  Final   Culture TOO YOUNG TO READ  Final   Report Status PENDING  Incomplete     Studies: Dg Chest 2 View  05/07/2015  CLINICAL DATA:  Shortness of  breath for 3 days.  Initial encounter. EXAM: CHEST  2 VIEW COMPARISON:  PA and lateral chest 04/23/2015. FINDINGS: Right IJ approach Port-A-Cath is again seen. Lungs are clear. Heart size is normal. No pneumothorax or pleural effusion. Thoracic spondylosis is noted. IMPRESSION: No acute disease. Electronically Signed   By: Inge Rise M.D.   On: 05/07/2015 13:20   Ct Angio Chest Pe W/cm &/or Wo Cm  05/07/2015  CLINICAL DATA:  Pt reports shortness of breath since Saturday; reports MD changed him from Xarelto to aspirin on Wednesday of last week due to blood in urine. 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 Multiplanar CT image reconstructions and MIPs were obtained to evaluate the vascular anatomy. CONTRAST:  100 mL Isovue 370 COMPARISON:  05/07/2015, 03/28/2015 FINDINGS: 3 mm right upper lobe pulmonary nodule image 46 series 6. 4 mm pulmonary nodule right apex image number 21. No consolidation or pleural effusion. Minimal patchy ground-glass attenuation scattered bilaterally. No filling defects in the pulmonary arterial system. Thoracic aorta shows no dissection or dilatation. There are numerous nonpathologic mediastinal lymph nodes. There is a borderline sub carinal lymph node measuring 10 mm. There is mild LAD coronary calcification. A Port-A-Cath on the right is seen with tip extending into the right atrium. No acute abnormalities involving the musculoskeletal system. Images through the upper abdomen demonstrate dilated common bile duct with stent within it. There is air and fluid in the common bile duct, which measures 14 mm. There is pneumobilia. There is mild increased attenuation in the gallbladder fossa. There is mild gallbladder wall enhancement. Review of the MIP images  confirms the above findings. IMPRESSION: No evidence of pulmonary embolism Minimal ground-glass attenuation in both lungs a nonspecific finding. This could represent very early developing infection or inflammation but may be due to areas of mild atelectasis as well. Stable tiny pulmonary nodules compared to 01/24/2015 Pneumobilia related to common bile duct stent. Mild attenuation gallbladder fossa with gallbladder wall enhancement likely related. Electronically Signed   By: Skipper Cliche M.D.   On: 05/07/2015 14:21    Scheduled Meds: . aspirin EC  81 mg Oral Daily  . diltiazem  360 mg Oral Daily  . enoxaparin (LOVENOX) injection  40 mg Subcutaneous Q24H  . levofloxacin (LEVAQUIN) IV  750 mg Intravenous Q24H  . loratadine  10 mg Oral Daily  . losartan  100 mg Oral Daily  . magnesium oxide  400 mg Oral Daily  . pantoprazole  40 mg Oral Daily    Assessment/Plan:  1. Acute respiratory failure with hypoxia. Continue oxygen supplementation. Check a room air pulse ox tomorrow morning. 2. Pneumonia with groundglass opacities bilateral seen on CT scan. Continue Levaquin and continue to monitor. Add nebulizer treatments and budesonide nebulizers. 3. Pneumobilia. This is secondary to stent in the common bile duct. No further management. 4. Atrial fibrillation rate controlled with diltiazem. Aspirin only for anticoagulation 5. Gastroesophageal reflux disease without esophagitis on Protonix 6. Essential hypertension stable 7. Non-Hodgkin's lymphoma follow-up as outpatient with oncology. Last white count normal.  Code Status:     Code Status Orders        Start     Ordered   05/07/15 1732  Full code   Continuous     05/07/15 1731    Code Status History    Date Active Date Inactive Code Status Order ID Comments User Context   12/28/2014  6:01 PM 12/30/2014  4:02 PM Full Code [id number]  Nicholes Mango, MD Inpatient      Disposition Plan: Home once off oxygen and vitals are  stable  Antibiotics:  Levaquin  Time spent: 25 minutes  Loletha Grayer  [employer]

## 2015-05-09 LAB — CBC
HCT: 29.7 % — ABNORMAL LOW (ref 40.0–52.0)
Hemoglobin: 10 g/dL — ABNORMAL LOW (ref 13.0–18.0)
MCH: 28.6 pg (ref 26.0–34.0)
MCHC: 33.6 g/dL (ref 32.0–36.0)
MCV: 85.2 fL (ref 80.0–100.0)
Platelets: 194 10*3/uL (ref 150–440)
RBC: 3.48 MIL/uL — ABNORMAL LOW (ref 4.40–5.90)
RDW: 16.3 % — ABNORMAL HIGH (ref 11.5–14.5)
WBC: 11.5 10*3/uL — ABNORMAL HIGH (ref 3.8–10.6)

## 2015-05-09 LAB — URINE CULTURE

## 2015-05-09 MED ORDER — LOSARTAN POTASSIUM 50 MG PO TABS
100.0000 mg | ORAL_TABLET | Freq: Every day | ORAL | Status: DC
  Administered 2015-05-09: 100 mg via ORAL
  Filled 2015-05-09: qty 2

## 2015-05-09 MED ORDER — METOPROLOL TARTRATE 25 MG PO TABS
12.5000 mg | ORAL_TABLET | Freq: Two times a day (BID) | ORAL | Status: DC
  Administered 2015-05-09 (×2): 12.5 mg via ORAL
  Filled 2015-05-09 (×2): qty 1

## 2015-05-09 NOTE — Progress Notes (Signed)
Patient ID: Mason Houston, male   DOB: 11-08-1950, 65 y.o.   MRN: [id number] [facility] PROGRESS NOTE  Mason Houston [id number] DOB: [birth year] DOA: 05/07/2015 PCP: Keith Rake, MD  HPI/Subjective: Patient still short of breath when he talks and moves around. Difficult to bring up any phlegm. Still with cough. Had a fever last night  Objective: Filed Vitals:   05/09/15 0901 05/09/15 1240  BP:  108/56  Pulse: 94 94  Temp:  99 F (37.2 C)  Resp:  18    Filed Weights   05/07/15 1205 05/07/15 1734  Weight: 108.863 kg (240 lb) 110.224 kg (243 lb)    ROS: Review of Systems  Constitutional: Positive for fever. Negative for chills.  Eyes: Negative for blurred vision.  Respiratory: Positive for cough and shortness of breath.   Cardiovascular: Negative for chest pain.  Gastrointestinal: Negative for nausea, vomiting, abdominal pain, diarrhea and constipation.  Genitourinary: Negative for dysuria.  Musculoskeletal: Negative for joint pain.  Neurological: Negative for dizziness and headaches.   Exam: Physical Exam  Constitutional: He is oriented to person, place, and time.  HENT:  Nose: No mucosal edema.  Mouth/Throat: No oropharyngeal exudate or posterior oropharyngeal edema.  Eyes: Conjunctivae, EOM and lids are normal. Pupils are equal, round, and reactive to light.  Neck: No JVD present. Carotid bruit is not present. No edema present. No thyroid mass and no thyromegaly present.  Cardiovascular: S1 normal and S2 normal.  Tachycardia present.  Exam reveals no gallop.   No murmur heard. Pulses:      Dorsalis pedis pulses are 2+ on the right side, and 2+ on the left side.  Respiratory: No respiratory distress. He has decreased breath sounds in the right lower field and the left lower field. He has no wheezes. He has no rhonchi. He has no rales.  GI: Soft. Bowel sounds are normal. There is no tenderness.  Musculoskeletal:       Right ankle: He exhibits swelling.   Left ankle: He exhibits swelling.  Lymphadenopathy:    He has no cervical adenopathy.  Neurological: He is alert and oriented to person, place, and time. No cranial nerve deficit.  Skin: Skin is warm. No rash noted. Nails show no clubbing.  Psychiatric: He has a normal mood and affect.      Data Reviewed: Basic Metabolic Panel:  Recent Labs Lab 05/04/15 1019 05/07/15 1311  NA 138 134*  K 4.6 4.2  CL 99* 101  CO2 32 23  GLUCOSE 156* 133*  BUN 22* 13  CREATININE 0.96 1.06  CALCIUM 8.8* 8.6*   Liver Function Tests:  Recent Labs Lab 05/07/15 1311  AST 21  ALT 21  ALKPHOS 105  BILITOT 0.6  PROT 6.5  ALBUMIN 3.4*   CBC:  Recent Labs Lab 05/04/15 1019 05/07/15 1311 05/09/15 0429  WBC 0.6* 8.5 11.5*  NEUTROABS 0.1* 6.8*  --   HGB 10.6* 10.8* 10.0*  HCT 31.6* 32.1* 29.7*  MCV 86.2 85.8 85.2  PLT 186 179 194     Recent Results (from the past 240 hour(s))  Culture, blood (Routine x 2)     Status: None (Preliminary result)   Collection Time: 05/07/15 12:02 PM  Result Value Ref Range Status   Specimen Description BLOOD RIGHT WRIST  Final   Special Requests BOTTLES DRAWN AEROBIC AND ANAEROBIC 1ML  Final   Culture NO GROWTH 2 DAYS  Final   Report Status PENDING  Incomplete  Culture, blood (Routine x 2)  Status: None (Preliminary result)   Collection Time: 05/07/15  1:11 PM  Result Value Ref Range Status   Specimen Description BLOOD RIGHT PORTA CATH  Final   Special Requests BOTTLES DRAWN AEROBIC AND ANAEROBIC [place]  Final   Culture NO GROWTH 2 DAYS  Final   Report Status PENDING  Incomplete  Urine culture     Status: None (Preliminary result)   Collection Time: 05/07/15  1:11 PM  Result Value Ref Range Status   Specimen Description URINE, RANDOM  Final   Special Requests NONE  Final   Culture TOO YOUNG TO READ  Final   Report Status PENDING  Incomplete     Studies: Ct Angio Chest Pe W/cm &/or Wo Cm  05/07/2015  CLINICAL DATA:  Pt reports shortness of  breath since Saturday; reports MD changed him from Xarelto to aspirin on Wednesday of last week due to blood in urine. 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 Multiplanar CT image reconstructions and MIPs were obtained to evaluate the vascular anatomy. CONTRAST:  100 mL Isovue 370 COMPARISON:  05/07/2015, 03/28/2015 FINDINGS: 3 mm right upper lobe pulmonary nodule image 46 series 6. 4 mm pulmonary nodule right apex image number 21. No consolidation or pleural effusion. Minimal patchy ground-glass attenuation scattered bilaterally. No filling defects in the pulmonary arterial system. Thoracic aorta shows no dissection or dilatation. There are numerous nonpathologic mediastinal lymph nodes. There is a borderline sub carinal lymph node measuring 10 mm. There is mild LAD coronary calcification. A Port-A-Cath on the right is seen with tip extending into the right atrium. No acute abnormalities involving the musculoskeletal system. Images through the upper abdomen demonstrate dilated common bile duct with stent within it. There is air and fluid in the common bile duct, which measures 14 mm. There is pneumobilia. There is mild increased attenuation in the gallbladder fossa. There is mild gallbladder wall enhancement. Review of the MIP images confirms the above findings. IMPRESSION: No evidence of pulmonary embolism Minimal ground-glass attenuation in both lungs a nonspecific finding. This could represent very early developing infection or inflammation but may be due to areas of mild atelectasis as well. Stable tiny pulmonary nodules compared to 01/24/2015 Pneumobilia related to common bile duct stent. Mild attenuation gallbladder fossa with gallbladder wall enhancement likely related. Electronically  Signed   By: Skipper Cliche M.D.   On: 05/07/2015 14:21    Scheduled Meds: . aspirin EC  81 mg Oral Daily  . budesonide (PULMICORT) nebulizer solution  0.25 mg Nebulization BID  . diltiazem  360 mg Oral Daily  . enoxaparin (LOVENOX) injection  40 mg Subcutaneous Q24H  . ipratropium-albuterol  3 mL Nebulization Q6H  . levofloxacin (LEVAQUIN) IV  750 mg Intravenous Q24H  . loratadine  10 mg Oral Daily  . losartan  100 mg Oral QHS  . magnesium oxide  400 mg Oral Daily  . metoprolol tartrate  12.5 mg Oral BID  . pantoprazole  40 mg Oral Daily    Assessment/Plan:  1. Acute respiratory failure with hypoxia. Patient with better oxygen saturations. 2. Pneumonia with groundglass opacities bilateral seen on CT scan. Continue Levaquin and continue to monitor. Added nebulizer treatments and budesonide nebulizers. 3. Pneumobilia. This is secondary to stent in the common bile duct. No further management. 4. Atrial fibrillation with tachycardia.  On diltiazem. Add low-dose metoprolol. Aspirin only for anticoagulation 5. Gastroesophageal reflux disease without esophagitis on Protonix 6. Essential hypertension stable 7. Non-Hodgkin's lymphoma follow-up as outpatient with oncology.   Code Status:     Code Status Orders        Start     Ordered   05/07/15 1732  Full code   Continuous     05/07/15 1731    Code Status History    Date Active Date Inactive Code Status Order ID Comments User Context   12/28/2014  6:01 PM 12/30/2014  4:02 PM Full Code [id number]  Nicholes Mango, MD Inpatient      Disposition Plan: Home Potentially tomorrow  Antibiotics:  Levaquin  Time spent: 25 minutes  [place]

## 2015-05-10 MED ORDER — HEPARIN SOD (PORK) LOCK FLUSH 100 UNIT/ML IV SOLN
500.0000 [IU] | Freq: Once | INTRAVENOUS | Status: AC
  Administered 2015-05-10: 500 [IU]
  Filled 2015-05-10: qty 5

## 2015-05-10 MED ORDER — SODIUM CHLORIDE 0.9% FLUSH
10.0000 mL | Freq: Once | INTRAVENOUS | Status: AC
  Administered 2015-05-10: 10 mL

## 2015-05-10 MED ORDER — LEVOFLOXACIN 750 MG PO TABS: 750.0000 mg | ORAL_TABLET | Freq: Every day | ORAL | Status: DC

## 2015-05-10 MED ORDER — METOPROLOL TARTRATE 25 MG PO TABS: 25.0000 mg | ORAL_TABLET | Freq: Two times a day (BID) | ORAL | Status: DC

## 2015-05-10 MED ORDER — METOPROLOL TARTRATE 25 MG PO TABS
25.0000 mg | ORAL_TABLET | Freq: Two times a day (BID) | ORAL | Status: DC
  Administered 2015-05-10: 25 mg via ORAL
  Filled 2015-05-10: qty 1

## 2015-05-10 NOTE — Progress Notes (Signed)
Patient temperature spiked to 102.9 this am.  Paged and spoke with Dr. Marcille Blanco.  Gave patient Tylenol with effective results.

## 2015-05-10 NOTE — Discharge Summary (Signed)
[facility] NAME: Mason Houston    MR#:  [id number]  DATE OF BIRTH:  14-Sep-1950  DATE OF ADMISSION:  05/07/2015 ADMITTING PHYSICIAN: Fritzi Mandes, MD  DATE OF DISCHARGE: 05/10/2015 11:10 AM  PRIMARY CARE PHYSICIAN: Keith Rake, MD    ADMISSION DIAGNOSIS:  Community acquired pneumonia [J18.9] Acute respiratory failure with hypoxia ([place]) [J96.01]  DISCHARGE DIAGNOSIS:  Active Problems:   Hypoxia   SECONDARY DIAGNOSIS:   Past Medical History  Diagnosis Date  . GERD (gastroesophageal reflux disease)   . Osteoarthrosis, unspecified whether generalized or localized, lower leg   . H/O Bell's palsy   . History of [place] measles   . Atrial fibrillation ([place])   . Hypertension   . Mini stroke ([place])   . Sixth cranial nerve palsy   . Fibromyalgia   . Collagen vascular disease ([place])   . Cancer [facility])     HOSPITAL COURSE:   1. Acute respiratory failure with hypoxia. Patient was on oxygen most of the hospital stay. On the day of discharge she felt much better walked around and was holding his oxygen saturations on room air. 2. Pneumonia with groundglass opacities and laterally seen on CT scan. Patient will be given Levaquin upon discharge and had Levaquin IV during the hospital course. During the hospital course I did add nebulizer treatments and budesonide nebulizers. I do not believe the temperature of yesterday of 102 because they tried to take it 3 times before it actually registered. The patient felt well at that time and actually feels very well right now. 3. Pneumobilia. Secondary to common bile duct stent. No further management. 4. Atrial fibrillation with tachycardia on diltiazem. I added metoprolol 25 mg twice a day. Aspirin only anticoagulation 5. Gastroesophageal reflux disease without esophagitis on Protonix 6. Essential hypertension stable 7. Non-Hodgkin's lymphoma follow-up with oncology as outpatient.   DISCHARGE  CONDITIONS:  Satisfactory  CONSULTS OBTAINED:  None  DRUG ALLERGIES:   Allergies  Allergen Reactions  . Celebrex [Celecoxib] Hives  . Penicillins Hives and Other (See Comments)    Has patient had a PCN reaction causing immediate rash, facial/tongue/throat swelling, SOB or lightheadedness with hypotension: No Has patient had a PCN reaction causing severe rash involving mucus membranes or skin necrosis: No Has patient had a PCN reaction that required hospitalization No Has patient had a PCN reaction occurring within the last 10 years: No If all of the above answers are "NO", then may proceed with Cephalosporin use.    DISCHARGE MEDICATIONS:   Discharge Medication List as of 05/10/2015  9:40 AM    START taking these medications   Details  levofloxacin (LEVAQUIN) 750 MG tablet Take 1 tablet (750 mg total) by mouth daily., Starting 05/10/2015, Until Discontinued, Print    metoprolol tartrate (LOPRESSOR) 25 MG tablet Take 1 tablet (25 mg total) by mouth 2 (two) times daily., Starting 05/10/2015, Until Discontinued, Print      CONTINUE these medications which have NOT CHANGED   Details  acetaminophen (TYLENOL) 500 MG tablet Take 1,000 mg by mouth every 6 (six) hours as needed for mild pain, fever or headache. , Until Discontinued, Historical Med    aspirin EC 81 MG tablet Take 81 mg by mouth at bedtime., Until Discontinued, Historical Med    b complex vitamins capsule Take 1 capsule by mouth at bedtime., Until Discontinued, Historical Med    diltiazem (TIAZAC) 360 MG 24 hr capsule Take 360 mg by mouth daily., Until  Discontinued, Historical Med    Ipratropium-Albuterol (COMBIVENT RESPIMAT) 20-100 MCG/ACT AERS respimat Inhale 1 puff into the lungs every 6 (six) hours as needed for wheezing or shortness of breath., Until Discontinued, Historical Med    lidocaine-prilocaine (EMLA) cream Apply 1 application topically as needed (prior to accessing port)., Until Discontinued, Historical Med     loratadine (CLARITIN) 10 MG tablet Take 10 mg by mouth daily as needed for allergies. , Until Discontinued, Historical Med    losartan (COZAAR) 100 MG tablet Take 100 mg by mouth at bedtime., Until Discontinued, Historical Med    magnesium oxide (MAG-OX) 400 (241.3 Mg) MG tablet Take 400 mg by mouth at bedtime. , Until Discontinued, Historical Med    nystatin (MYCOSTATIN) 100000 UNIT/ML suspension Take 5 mLs by mouth 4 (four) times daily as needed (for thrush)., Until Discontinued, Historical Med    omeprazole (PRILOSEC) 20 MG capsule Take 20 mg by mouth at bedtime., Until Discontinued, Historical Med    ondansetron (ZOFRAN) 8 MG tablet Take 8 mg by mouth every 8 (eight) hours as needed for nausea or vomiting., Until Discontinued, Historical Med    prochlorperazine (COMPAZINE) 10 MG tablet Take 10 mg by mouth every 6 (six) hours as needed for nausea or vomiting., Until Discontinued, Historical Med      STOP taking these medications     predniSONE (DELTASONE) 20 MG tablet          DISCHARGE INSTRUCTIONS:   Follow-up PMD one week Follow-up with oncology as scheduled  If you experience worsening of your admission symptoms, develop shortness of breath, life threatening emergency, suicidal or homicidal thoughts you must seek medical attention immediately by calling 911 or calling your MD immediately  if symptoms less severe.  You Must read complete instructions/literature along with all the possible adverse reactions/side effects for all the Medicines you take and that have been prescribed to you. Take any new Medicines after you have completely understood and accept all the possible adverse reactions/side effects.   Please note  You were cared for by a hospitalist during your hospital stay. If you have any questions about your discharge medications or the care you received while you were in the hospital after you are discharged, you can call the unit and asked to speak with the  hospitalist on call if the hospitalist that took care of you is not available. Once you are discharged, your primary care physician will handle any further medical issues. Please note that NO REFILLS for any discharge medications will be authorized once you are discharged, as it is imperative that you return to your primary care physician (or establish a relationship with a primary care physician if you do not have one) for your aftercare needs so that they can reassess your need for medications and monitor your lab values.    Today   CHIEF COMPLAINT:   Chief Complaint  Patient presents with  . Shortness of Breath  . Code Sepsis    HISTORY OF PRESENT ILLNESS:  Mason Houston  is a 65 y.o. male presented with shortness of breath and found to have pneumonia on CT scan   VITAL SIGNS:  Blood pressure 107/58, pulse 94, temperature 97.9 F (36.6 C), temperature source Oral, resp. rate 18, height 5\' 8"  (1.727 m), weight 110.224 kg (243 lb), SpO2 93 %.    PHYSICAL EXAMINATION:  GENERAL:  65 y.o.-year-old patient lying in the bed with no acute distress.  EYES: Pupils equal, round, reactive to light and accommodation.  No scleral icterus. Extraocular muscles intact.  HEENT: Head atraumatic, normocephalic. Oropharynx and nasopharynx clear.  NECK:  Supple, no jugular venous distention. No thyroid enlargement, no tenderness.  LUNGS: Normal breath sounds bilaterally, no wheezing, rales,rhonchi or crepitation. No use of accessory muscles of respiration.  CARDIOVASCULAR: S1, S2 normal. No murmurs, rubs, or gallops.  ABDOMEN: Soft, non-tender, non-distended. Bowel sounds present. No organomegaly or mass.  EXTREMITIES: Trace  edema,  no cyanosis, or clubbing.  NEUROLOGIC: Cranial nerves II through XII are intact. Muscle strength 5/5 in all extremities. Sensation intact. Gait not checked.  PSYCHIATRIC: The patient is alert and oriented x 3.  SKIN: No obvious rash, lesion, or ulcer.   DATA REVIEW:    CBC  Recent Labs Lab 05/09/15 0429  WBC 11.5*  HGB 10.0*  HCT 29.7*  PLT 194    Chemistries   Recent Labs Lab 05/07/15 1311  NA 134*  K 4.2  CL 101  CO2 23  GLUCOSE 133*  BUN 13  CREATININE 1.06  CALCIUM 8.6*  AST 21  ALT 21  ALKPHOS 105  BILITOT 0.6    Microbiology Results  Results for orders placed or performed during the hospital encounter of 05/07/15  Culture, blood (Routine x 2)     Status: None (Preliminary result)   Collection Time: 05/07/15 12:02 PM  Result Value Ref Range Status   Specimen Description BLOOD RIGHT WRIST  Final   Special Requests BOTTLES DRAWN AEROBIC AND ANAEROBIC 1ML  Final   Culture NO GROWTH 3 DAYS  Final   Report Status PENDING  Incomplete  Culture, blood (Routine x 2)     Status: None (Preliminary result)   Collection Time: 05/07/15  1:11 PM  Result Value Ref Range Status   Specimen Description BLOOD RIGHT PORTA CATH  Final   Special Requests BOTTLES DRAWN AEROBIC AND ANAEROBIC [place]  Final   Culture NO GROWTH 3 DAYS  Final   Report Status PENDING  Incomplete  Urine culture     Status: Abnormal   Collection Time: 05/07/15  1:11 PM  Result Value Ref Range Status   Specimen Description URINE, RANDOM  Final   Special Requests NONE  Final   Culture MULTIPLE SPECIES PRESENT, SUGGEST RECOLLECTION (A)  Final   Report Status 05/09/2015 FINAL  Final    Management plans discussed with the patient, And he is in agreement.  CODE STATUS:  Code Status History    Date Active Date Inactive Code Status Order ID Comments User Context   05/07/2015  5:31 PM 05/10/2015  3:11 PM Full Code [id number]  Fritzi Mandes, MD Inpatient   12/28/2014  6:01 PM 12/30/2014  4:02 PM Full Code [id number]  Nicholes Mango, MD Inpatient      TOTAL TIME TAKING CARE OF THIS PATIENT: 35  minutes.    Loletha Grayer M.D on 05/10/2015 at 4:13 PM  Between 7am to 6pm - Pager - [phone]  After 6pm go to [email or web address] - password [email or web address] [facility]   [phone]  CC: Primary care physician; Keith Rake, MD

## 2015-05-10 NOTE — Progress Notes (Signed)
Received MD order to discharge patient to home, reviewed home meds, prescriptions and discharge instruction with patient and patient verbalized understanding, discharged in wheelchair with auxillary and wife to home

## 2015-05-10 NOTE — Discharge Instructions (Signed)

## 2015-05-10 NOTE — Progress Notes (Signed)
SATURATION QUALIFICATIONS: (This note is used to comply with regulatory documentation for home oxygen)  Patient Saturations on Room Air at Rest = 93%  Patient Saturations on Room Air while Ambulating = 89%  Patient Saturations on 0 Liters of oxygen while Ambulating = 89%  Please briefly explain why patient needs home oxygen: 

## 2015-05-11 ENCOUNTER — Inpatient Hospital Stay: Payer: 59 | Attending: Internal Medicine

## 2015-05-11 ENCOUNTER — Ambulatory Visit: Payer: 59

## 2015-05-11 ENCOUNTER — Telehealth: Payer: Self-pay | Admitting: *Deleted

## 2015-05-11 DIAGNOSIS — E875 Hyperkalemia: Secondary | ICD-10-CM | POA: Insufficient documentation

## 2015-05-11 DIAGNOSIS — I4891 Unspecified atrial fibrillation: Secondary | ICD-10-CM | POA: Insufficient documentation

## 2015-05-11 DIAGNOSIS — J189 Pneumonia, unspecified organism: Secondary | ICD-10-CM | POA: Insufficient documentation

## 2015-05-11 DIAGNOSIS — K219 Gastro-esophageal reflux disease without esophagitis: Secondary | ICD-10-CM | POA: Insufficient documentation

## 2015-05-11 DIAGNOSIS — M797 Fibromyalgia: Secondary | ICD-10-CM | POA: Insufficient documentation

## 2015-05-11 DIAGNOSIS — I10 Essential (primary) hypertension: Secondary | ICD-10-CM | POA: Insufficient documentation

## 2015-05-11 DIAGNOSIS — M1990 Unspecified osteoarthritis, unspecified site: Secondary | ICD-10-CM | POA: Insufficient documentation

## 2015-05-11 DIAGNOSIS — Z87891 Personal history of nicotine dependence: Secondary | ICD-10-CM | POA: Insufficient documentation

## 2015-05-11 DIAGNOSIS — Z9221 Personal history of antineoplastic chemotherapy: Secondary | ICD-10-CM | POA: Insufficient documentation

## 2015-05-11 DIAGNOSIS — Z87820 Personal history of traumatic brain injury: Secondary | ICD-10-CM | POA: Insufficient documentation

## 2015-05-11 DIAGNOSIS — Z79899 Other long term (current) drug therapy: Secondary | ICD-10-CM | POA: Insufficient documentation

## 2015-05-11 DIAGNOSIS — C8339 Diffuse large B-cell lymphoma, extranodal and solid organ sites: Secondary | ICD-10-CM | POA: Insufficient documentation

## 2015-05-11 DIAGNOSIS — G4730 Sleep apnea, unspecified: Secondary | ICD-10-CM | POA: Insufficient documentation

## 2015-05-11 DIAGNOSIS — C8330 Diffuse large B-cell lymphoma, unspecified site: Secondary | ICD-10-CM

## 2015-05-11 LAB — CBC WITH DIFFERENTIAL/PLATELET
Basophils Absolute: 0.2 10*3/uL — ABNORMAL HIGH (ref 0–0.1)
Basophils Relative: 1 %
Eosinophils Absolute: 0.1 10*3/uL (ref 0–0.7)
Eosinophils Relative: 1 %
HCT: 32.1 % — ABNORMAL LOW (ref 40.0–52.0)
Hemoglobin: 10.8 g/dL — ABNORMAL LOW (ref 13.0–18.0)
Lymphocytes Relative: 5 %
Lymphs Abs: 0.7 10*3/uL — ABNORMAL LOW (ref 1.0–3.6)
MCH: 28.3 pg (ref 26.0–34.0)
MCHC: 33.5 g/dL (ref 32.0–36.0)
MCV: 84.5 fL (ref 80.0–100.0)
Monocytes Absolute: 1.1 10*3/uL — ABNORMAL HIGH (ref 0.2–1.0)
Monocytes Relative: 7 %
Neutro Abs: 14 10*3/uL — ABNORMAL HIGH (ref 1.4–6.5)
Neutrophils Relative %: 86 %
Platelets: 324 10*3/uL (ref 150–440)
RBC: 3.8 MIL/uL — ABNORMAL LOW (ref 4.40–5.90)
RDW: 16 % — ABNORMAL HIGH (ref 11.5–14.5)
WBC: 16.2 10*3/uL — ABNORMAL HIGH (ref 3.8–10.6)

## 2015-05-11 LAB — BASIC METABOLIC PANEL
Anion gap: 8 (ref 5–15)
BUN: 24 mg/dL — ABNORMAL HIGH (ref 6–20)
CO2: 25 mmol/L (ref 22–32)
Calcium: 8.9 mg/dL (ref 8.9–10.3)
Chloride: 104 mmol/L (ref 101–111)
Creatinine, Ser: 1.2 mg/dL (ref 0.61–1.24)
GFR calc Af Amer: >60 mL/min (ref >60)
GFR calc non Af Amer: >60 mL/min (ref >60)
Glucose, Bld: 119 mg/dL — ABNORMAL HIGH (ref 65–99)
Potassium: 4.5 mmol/L (ref 3.5–5.1)
Sodium: 137 mmol/L (ref 135–145)

## 2015-05-11 NOTE — Telephone Encounter (Signed)
Patient did not show up for his MUGA Scan today. (He was discharged form [facility] yesterday) I notified Vaughan Basta of this. He will need to be rescheduled

## 2015-05-11 NOTE — Telephone Encounter (Signed)
msg sent to cancer center scheduling to r/s this apt

## 2015-05-12 LAB — CULTURE, BLOOD (ROUTINE X 2)
Culture: NO GROWTH
Culture: NO GROWTH

## 2015-05-15 ENCOUNTER — Ambulatory Visit (INDEPENDENT_AMBULATORY_CARE_PROVIDER_SITE_OTHER): Payer: 59 | Admitting: Family Medicine

## 2015-05-15 ENCOUNTER — Ambulatory Visit
Admission: RE | Admit: 2015-05-15 | Discharge: 2015-05-15 | Disposition: A | Discharge status: Home / Self Care | Payer: 59 | Source: Ambulatory Visit | Attending: Family Medicine | Admitting: Family Medicine

## 2015-05-15 ENCOUNTER — Encounter: Payer: Self-pay | Admitting: *Deleted

## 2015-05-15 ENCOUNTER — Telehealth: Payer: Self-pay | Admitting: Internal Medicine

## 2015-05-15 ENCOUNTER — Encounter: Payer: Self-pay | Admitting: Family Medicine

## 2015-05-15 ENCOUNTER — Telehealth: Payer: Self-pay | Admitting: *Deleted

## 2015-05-15 VITALS — BP 124/62 | HR 101 | Temp 98.0°F | Resp 18 | Ht 68.0 in | Wt 243.0 lb

## 2015-05-15 DIAGNOSIS — J189 Pneumonia, unspecified organism: Secondary | ICD-10-CM | POA: Insufficient documentation

## 2015-05-15 DIAGNOSIS — R509 Fever, unspecified: Secondary | ICD-10-CM | POA: Diagnosis not present

## 2015-05-15 NOTE — Telephone Encounter (Signed)
Spoke to patient; short of breath with exertion; cough. Patient needing to take 2 Tylenol to every 6 hours; it stops spiking temp about 100.chest x-ray still shows interstitial prominence bilaterally. Spoke to Dr. Rich Number; regarding bronchoscopy. Asked patient to stop taking aspirin. Waiting to hear regarding starting of Bactrim/prednisone. Dr.B

## 2015-05-15 NOTE — Telephone Encounter (Signed)
He is still having fevers of 100.6 unless he takes tylenol. His PCP Dr. Manuella Ghazi told him that he should not be running a fever and having to take tylenol to keep it down and his wbc was low. He did cxr today as well as urine culture and sent him home with sputum cup to collect a specimen and bring it back tom. To [facility].  He is coughing up white or clear sputum.  He gets sob on exertion and even talking on the phone.  He feels like he will get better but does not feel that he can get chemo Friday .  I told him that based on all above he would not get chemo and that I would send info above to Dr. B and his team but to still come in on Friday and see doctor and get labs and pt agrees to that.  i did run into Dr. B and told him about pt and he says the same but he will look at his cxr to see if he needs to be seen sooner than Friday.  Patient will keep Friday appt unless office calls for sooner appt

## 2015-05-15 NOTE — Progress Notes (Signed)
Name: Mason Houston   MRN: [id number]    DOB: [birth year]   Date:05/15/2015       Progress Note  Subjective  Chief Complaint  Chief Complaint  Patient presents with  . Pneumonia    hospital follow up    Pneumonia He complains of chest tightness, cough, difficulty breathing, shortness of breath, sputum production and wheezing. There is no hemoptysis. This is a new problem. The problem has been gradually improving. Associated symptoms include dyspnea on exertion, a fever (Has been taking two Tylenol tablets 500 mg daily to maintain his body temperature. This AM, his temp was 100.19F before he took Tylenol) and malaise/fatigue. Pertinent negatives include no chest pain.   Discharged from [facility] for acute respiratory failure with hypoxia, CT scan of chest showed pneumonia, was treated with Levaquin 750 mg daily. Shortness of breath is improved but still present, still spiking fevers at home (although his temperature is normal in clinic, reports taking 2 tablets of Tylenol 500 mg this morning)  Past Medical History  Diagnosis Date  . GERD (gastroesophageal reflux disease)   . Osteoarthrosis, unspecified whether generalized or localized, lower leg   . H/O Bell's palsy   . History of [place] measles   . Atrial fibrillation ([place])   . Hypertension   . Mini stroke ([place])   . Sixth cranial nerve palsy   . Fibromyalgia   . Collagen vascular disease ([place])   . Cancer [facility])     Past Surgical History  Procedure Laterality Date  . Cyst excision    . Trigger finger release    . Ercp N/A 12/29/2014    Procedure: ENDOSCOPIC RETROGRADE CHOLANGIOPANCREATOGRAPHY (ERCP);  Surgeon: Hulen Luster, MD;  Location: [facility] ENDOSCOPY;  Service: Endoscopy;  Laterality: N/A;  . Peripheral vascular catheterization N/A 01/31/2015    Procedure: Porta Cath Insertion;  Surgeon: Katha Cabal, MD;  Location: [place] CV LAB;  Service: Cardiovascular;  Laterality: N/A;  . Ercp N/A 02/21/2015    Procedure: ENDOSCOPIC  RETROGRADE CHOLANGIOPANCREATOGRAPHY (ERCP);  Surgeon: Hulen Luster, MD;  Location: [facility] ENDOSCOPY;  Service: Gastroenterology;  Laterality: N/A;    Family History  Problem Relation Age of Onset  . Hypertension Mother     Social History   Social History  . Marital Status: Married    Spouse Name: N/A  . Number of Children: N/A  . Years of Education: N/A   Occupational History  . Not on file.   Social History Main Topics  . Smoking status: Former Smoker -- 4.50 packs/day for 15 years    Types: Cigarettes  . Smokeless tobacco: Not on file  . Alcohol Use: No  . Drug Use: Yes    Special: Marijuana     Comment: PAST  . Sexual Activity: Not on file   Other Topics Concern  . Not on file   Social History Narrative     Current outpatient prescriptions:  .  acetaminophen (TYLENOL) 500 MG tablet, Take 1,000 mg by mouth every 6 (six) hours as needed for mild pain, fever or headache. , Disp: , Rfl:  .  aspirin EC 81 MG tablet, Take 81 mg by mouth at bedtime., Disp: , Rfl:  .  b complex vitamins capsule, Take 1 capsule by mouth at bedtime., Disp: , Rfl:  .  diltiazem (TIAZAC) 360 MG 24 hr capsule, Take 360 mg by mouth daily., Disp: , Rfl:  .  Ipratropium-Albuterol (COMBIVENT RESPIMAT) 20-100 MCG/ACT AERS respimat, Inhale 1 puff into the lungs every  6 (six) hours as needed for wheezing or shortness of breath., Disp: , Rfl:  .  lidocaine-prilocaine (EMLA) cream, Apply 1 application topically as needed (prior to accessing port)., Disp: , Rfl:  .  loratadine (CLARITIN) 10 MG tablet, Take 10 mg by mouth daily as needed for allergies. , Disp: , Rfl:  .  losartan (COZAAR) 100 MG tablet, Take 100 mg by mouth at bedtime., Disp: , Rfl:  .  magnesium oxide (MAG-OX) 400 (241.3 Mg) MG tablet, Take 400 mg by mouth at bedtime. , Disp: , Rfl:  .  metoprolol tartrate (LOPRESSOR) 25 MG tablet, Take 1 tablet (25 mg total) by mouth 2 (two) times daily., Disp: 60 tablet, Rfl: 0 .  nystatin (MYCOSTATIN) 100000  UNIT/ML suspension, Take 5 mLs by mouth 4 (four) times daily as needed (for thrush)., Disp: , Rfl:  .  omeprazole (PRILOSEC) 20 MG capsule, Take 20 mg by mouth at bedtime., Disp: , Rfl:  .  ondansetron (ZOFRAN) 8 MG tablet, Take 8 mg by mouth every 8 (eight) hours as needed for nausea or vomiting., Disp: , Rfl:  .  prochlorperazine (COMPAZINE) 10 MG tablet, Take 10 mg by mouth every 6 (six) hours as needed for nausea or vomiting., Disp: , Rfl:   Allergies  Allergen Reactions  . Celebrex [Celecoxib] Hives  . Penicillins Hives and Other (See Comments)    Has patient had a PCN reaction causing immediate rash, facial/tongue/throat swelling, SOB or lightheadedness with hypotension: No Has patient had a PCN reaction causing severe rash involving mucus membranes or skin necrosis: No Has patient had a PCN reaction that required hospitalization No Has patient had a PCN reaction occurring within the last 10 years: No If all of the above answers are "NO", then may proceed with Cephalosporin use.     Review of Systems  Constitutional: Positive for fever (Has been taking two Tylenol tablets 500 mg daily to maintain his body temperature. This AM, his temp was 100.38F before he took Tylenol) and malaise/fatigue.  Respiratory: Positive for cough, sputum production, shortness of breath and wheezing. Negative for hemoptysis.   Cardiovascular: Positive for dyspnea on exertion. Negative for chest pain.    Objective  Filed Vitals:   05/15/15 1043  BP: 124/62  Pulse: 101  Temp: 98 F (36.7 C)  TempSrc: Oral  Resp: 18  Height: 5\' 8"  (1.727 m)  Weight: 243 lb (110.224 kg)  SpO2: 82%    Physical Exam  Constitutional: He is well-developed, well-nourished, and in no distress.  HENT:  Mouth/Throat: No posterior oropharyngeal erythema.  Cardiovascular: Normal heart sounds.  Tachycardia present.   Pulmonary/Chest: Tachypnea noted. He has wheezes.  Faint wheezes in left and right lung fields.  Nursing  note and vitals reviewed.    Assessment & Plan  1. Pneumonia, unspecified laterality, unspecified part of lung Minimal groundglass attenuation in both lungs, and nonspecific but attributed to pneumonia based on patient's symptoms. We will obtain chest x-ray to ensure resolution of infection. - DG Chest 2 View; Future  2. Fever, unspecified fever cause Unclear as to why he is still pacing spikes in temperature, could be related to the recent pneumonia versus a new infection given that he is immunosuppressed after multiple rounds of chemotherapy. We will obtain laboratory evaluation, including blood work, sputum culture, and urine analysis to rule out secondary causes. Follow-up after review of lab results. - CBC with Differential - Comprehensive Metabolic Panel (CMET) - Influenza a and b - Sputum culture - Urine Culture -  Urinalysis, microscopic only  Hospital discharge summary reviewed in detail. Juwana Thoreson [facility] 05/15/2015 11:11 AM

## 2015-05-15 NOTE — Telephone Encounter (Signed)
Pt called and he has been in hospital for 3 days with pneumonia and was sent home on levaquin in which he finishes last Sat.

## 2015-05-16 ENCOUNTER — Other Ambulatory Visit: Payer: Self-pay | Admitting: Internal Medicine

## 2015-05-16 ENCOUNTER — Telehealth: Payer: Self-pay | Admitting: Internal Medicine

## 2015-05-16 ENCOUNTER — Telehealth: Payer: Self-pay | Admitting: *Deleted

## 2015-05-16 ENCOUNTER — Other Ambulatory Visit: Payer: Self-pay | Admitting: *Deleted

## 2015-05-16 ENCOUNTER — Ambulatory Visit: Admit: 2015-05-16 | Payer: 59 | Admitting: Gastroenterology

## 2015-05-16 DIAGNOSIS — C8330 Diffuse large B-cell lymphoma, unspecified site: Secondary | ICD-10-CM

## 2015-05-16 DIAGNOSIS — B59 Pneumocystosis: Secondary | ICD-10-CM

## 2015-05-16 HISTORY — DX: Cerebral infarction, unspecified: I63.9

## 2015-05-16 HISTORY — DX: Sleep apnea, unspecified: G47.30

## 2015-05-16 LAB — CBC WITH DIFFERENTIAL/PLATELET
Basophils Absolute: 0.2 10*3/uL (ref 0.0–0.2)
Basos: 2 %
EOS (ABSOLUTE): 0.1 10*3/uL (ref 0.0–0.4)
Eos: 1 %
Hematocrit: 32.4 % — ABNORMAL LOW (ref 37.5–51.0)
Hemoglobin: 10.3 g/dL — ABNORMAL LOW (ref 12.6–17.7)
Immature Grans (Abs): 0.1 10*3/uL (ref 0.0–0.1)
Immature Granulocytes: 1 %
Lymphocytes Absolute: 1.1 10*3/uL (ref 0.7–3.1)
Lymphs: 11 %
MCH: 27.2 pg (ref 26.6–33.0)
MCHC: 31.8 g/dL (ref 31.5–35.7)
MCV: 86 fL (ref 79–97)
Monocytes Absolute: 1.1 10*3/uL — ABNORMAL HIGH (ref 0.1–0.9)
Monocytes: 11 %
Neutrophils Absolute: 7.5 10*3/uL — ABNORMAL HIGH (ref 1.4–7.0)
Neutrophils: 74 %
Platelets: 585 10*3/uL — ABNORMAL HIGH (ref 150–379)
RBC: 3.78 x10E6/uL — ABNORMAL LOW (ref 4.14–5.80)
RDW: 16.3 % — ABNORMAL HIGH (ref 12.3–15.4)
WBC: 10.1 10*3/uL (ref 3.4–10.8)

## 2015-05-16 LAB — URINALYSIS, MICROSCOPIC ONLY
Bacteria, UA: NONE SEEN
Casts: NONE SEEN /lpf

## 2015-05-16 LAB — COMPREHENSIVE METABOLIC PANEL
ALT: 18 IU/L (ref 0–44)
AST: 27 IU/L (ref 0–40)
Albumin/Globulin Ratio: 1.5 (ref 1.2–2.2)
Albumin: 3.7 g/dL (ref 3.6–4.8)
Alkaline Phosphatase: 94 IU/L (ref 39–117)
BUN/Creatinine Ratio: 18 (ref 10–24)
BUN: 16 mg/dL (ref 8–27)
Bilirubin Total: 0.2 mg/dL (ref 0.0–1.2)
CO2: 24 mmol/L (ref 18–29)
Calcium: 9.6 mg/dL (ref 8.6–10.2)
Chloride: 101 mmol/L (ref 96–106)
Creatinine, Ser: 0.89 mg/dL (ref 0.76–1.27)
GFR calc Af Amer: 105 mL/min/{1.73_m2} (ref >59)
GFR calc non Af Amer: 91 mL/min/{1.73_m2} (ref >59)
Globulin, Total: 2.4 g/dL (ref 1.5–4.5)
Glucose: 152 mg/dL — ABNORMAL HIGH (ref 65–99)
Potassium: 4.9 mmol/L (ref 3.5–5.2)
Sodium: 142 mmol/L (ref 134–144)
Total Protein: 6.1 g/dL (ref 6.0–8.5)

## 2015-05-16 LAB — URINE CULTURE: Organism ID, Bacteria: NO GROWTH

## 2015-05-16 SURGERY — ERCP, WITH INTERVENTION IF INDICATED
Anesthesia: General

## 2015-05-16 MED ORDER — PREDNISONE 20 MG PO TABS: ORAL_TABLET | ORAL | Status: DC

## 2015-05-16 MED ORDER — SULFAMETHOXAZOLE-TRIMETHOPRIM 800-160 MG PO TABS: ORAL_TABLET | ORAL | Status: DC

## 2015-05-16 NOTE — Telephone Encounter (Signed)
Per Dr. Rogue Bussing, called Mason Houston. Spoke with pharmacy. RX for Bactrim was sent to pharmacy today. Dosing needs to be changed to Bactrim DS 3 pills three times a day. Dispense a 3 weeks supply. No RFs.  Read back process performed with pharmacist. Per pharmacy, pt also sent fax to cancer center now to clarify dose.

## 2015-05-16 NOTE — Telephone Encounter (Signed)
Spoke to pt re: starting bactrim/dose; discussed the allergy with celebrex; many years ago had- blotchy rash on legs when out in sun many many years ago. Otherwise does not remember any specific allergy to bactrim or sulfa drugs. Also started on prednisone. He is aware that pulmonary is planning bronchoscopy either 5/11 or 5/12.

## 2015-05-17 ENCOUNTER — Encounter
Admission: RE | Admit: 2015-05-17 | Discharge: 2015-05-17 | Disposition: A | Discharge status: Home / Self Care | Payer: 59 | Source: Ambulatory Visit | Attending: Internal Medicine | Admitting: Internal Medicine

## 2015-05-17 DIAGNOSIS — Z9189 Other specified personal risk factors, not elsewhere classified: Secondary | ICD-10-CM | POA: Diagnosis present

## 2015-05-17 DIAGNOSIS — C8330 Diffuse large B-cell lymphoma, unspecified site: Secondary | ICD-10-CM | POA: Diagnosis present

## 2015-05-17 DIAGNOSIS — Z9221 Personal history of antineoplastic chemotherapy: Secondary | ICD-10-CM | POA: Insufficient documentation

## 2015-05-17 MED ORDER — TECHNETIUM TC 99M-LABELED RED BLOOD CELLS IV KIT
20.0000 | PACK | Freq: Once | INTRAVENOUS | Status: AC | PRN
  Administered 2015-05-17: 19.52 via INTRAVENOUS

## 2015-05-18 ENCOUNTER — Inpatient Hospital Stay: Payer: 59

## 2015-05-18 ENCOUNTER — Inpatient Hospital Stay (HOSPITAL_BASED_OUTPATIENT_CLINIC_OR_DEPARTMENT_OTHER): Payer: 59 | Admitting: Internal Medicine

## 2015-05-18 VITALS — BP 113/69 | HR 97 | Temp 97.4°F | Resp 18 | Wt 245.4 lb

## 2015-05-18 DIAGNOSIS — E875 Hyperkalemia: Secondary | ICD-10-CM | POA: Diagnosis not present

## 2015-05-18 DIAGNOSIS — J189 Pneumonia, unspecified organism: Secondary | ICD-10-CM

## 2015-05-18 DIAGNOSIS — Z79899 Other long term (current) drug therapy: Secondary | ICD-10-CM

## 2015-05-18 DIAGNOSIS — Z9221 Personal history of antineoplastic chemotherapy: Secondary | ICD-10-CM | POA: Diagnosis not present

## 2015-05-18 DIAGNOSIS — C8330 Diffuse large B-cell lymphoma, unspecified site: Secondary | ICD-10-CM

## 2015-05-18 DIAGNOSIS — C8339 Diffuse large B-cell lymphoma, extranodal and solid organ sites: Secondary | ICD-10-CM

## 2015-05-18 LAB — COMPREHENSIVE METABOLIC PANEL
ALT: 22 U/L (ref 17–63)
AST: 27 U/L (ref 15–41)
Albumin: 3.5 g/dL (ref 3.5–5.0)
Alkaline Phosphatase: 94 U/L (ref 38–126)
Anion gap: 9 (ref 5–15)
BUN: 26 mg/dL — ABNORMAL HIGH (ref 6–20)
CO2: 21 mmol/L — ABNORMAL LOW (ref 22–32)
Calcium: 9.1 mg/dL (ref 8.9–10.3)
Chloride: 103 mmol/L (ref 101–111)
Creatinine, Ser: 1.44 mg/dL — ABNORMAL HIGH (ref 0.61–1.24)
GFR calc Af Amer: 58 mL/min — ABNORMAL LOW (ref >60)
GFR calc non Af Amer: 50 mL/min — ABNORMAL LOW (ref >60)
Glucose, Bld: 305 mg/dL — ABNORMAL HIGH (ref 65–99)
Potassium: 5.5 mmol/L — ABNORMAL HIGH (ref 3.5–5.1)
Sodium: 133 mmol/L — ABNORMAL LOW (ref 135–145)
Total Bilirubin: 0.2 mg/dL — ABNORMAL LOW (ref 0.3–1.2)
Total Protein: 7 g/dL (ref 6.5–8.1)

## 2015-05-18 LAB — CBC WITH DIFFERENTIAL/PLATELET
Basophils Absolute: 0.1 10*3/uL (ref 0–0.1)
Basophils Relative: 1 %
Eosinophils Absolute: 0 10*3/uL (ref 0–0.7)
Eosinophils Relative: 0 %
HCT: 31.7 % — ABNORMAL LOW (ref 40.0–52.0)
Hemoglobin: 10.5 g/dL — ABNORMAL LOW (ref 13.0–18.0)
Lymphocytes Relative: 7 %
Lymphs Abs: 0.8 10*3/uL — ABNORMAL LOW (ref 1.0–3.6)
MCH: 28.5 pg (ref 26.0–34.0)
MCHC: 33.3 g/dL (ref 32.0–36.0)
MCV: 85.6 fL (ref 80.0–100.0)
Monocytes Absolute: 0.2 10*3/uL (ref 0.2–1.0)
Monocytes Relative: 2 %
Neutro Abs: 10.1 10*3/uL — ABNORMAL HIGH (ref 1.4–6.5)
Neutrophils Relative %: 90 %
Platelets: 632 10*3/uL — ABNORMAL HIGH (ref 150–440)
RBC: 3.71 MIL/uL — ABNORMAL LOW (ref 4.40–5.90)
RDW: 17 % — ABNORMAL HIGH (ref 11.5–14.5)
WBC: 11.1 10*3/uL — ABNORMAL HIGH (ref 3.8–10.6)

## 2015-05-18 NOTE — Progress Notes (Signed)
Patient here as hospital follow up regarding pneumonia.  States he is feeling much better.  Not as SOB as he was.

## 2015-05-18 NOTE — Progress Notes (Signed)
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   # Obstructive jaundice-sec to above s/p Stent [Dr.Oh]   INTERVAL HISTORY:  65 year old  Male patient with above history of diffuse large B-cell lymphoma stage IE peripancreatic region currently R CHOP  Status post 5 treatments is here for follow-up.  In the interim patient was admitted to the hospital for shortness of breath- treated with antibiotics for pneumonia. CT chest showed bilateral developing infiltrates. Patient did not significantly improve post discharge. Patient has been started on Bactrim 2 days ago; given the concerns for pneumocystis.  Patient has been on Bactrim for the last 2 days- he feels significantly improved; shortness of breath is much better. Denies any skin rash. Cough significantly improved.  No nausea no vomiting. Appetite is good.Patient denies any tingling and numbness in the hand and feet.  His appetite is good. Is not losing any weight. No abdominal pain.   REVIEW OF SYSTEMS:  A complete 10 point review of system is done which is negative except mentioned above/history of present illness.   PAST MEDICAL HISTORY :  Past Medical History  Diagnosis Date  . GERD (gastroesophageal reflux disease)   . Osteoarthrosis, unspecified whether generalized or localized, lower leg   . H/O Bell's palsy   . History of [place] measles   . Atrial fibrillation ([place])   . Hypertension   . Mini stroke ([place])   . Sixth cranial nerve palsy   . Fibromyalgia   . Collagen vascular disease ([place])   . Cancer ([place])   . Stroke ([place])   . Sleep apnea     PAST SURGICAL HISTORY :   Past Surgical History  Procedure Laterality Date  . Cyst excision    . Trigger finger release    . Ercp  N/A 12/29/2014    Procedure: ENDOSCOPIC RETROGRADE CHOLANGIOPANCREATOGRAPHY (ERCP);  Surgeon: Hulen Luster, MD;  Location: [facility] ENDOSCOPY;  Service: Endoscopy;  Laterality: N/A;  . Peripheral vascular catheterization N/A 01/31/2015    Procedure: Porta Cath Insertion;  Surgeon: Katha Cabal, MD;  Location: [place] CV LAB;  Service: Cardiovascular;  Laterality: N/A;  . Ercp N/A 02/21/2015    Procedure: ENDOSCOPIC RETROGRADE CHOLANGIOPANCREATOGRAPHY (ERCP);  Surgeon: Hulen Luster, MD;  Location: [facility] ENDOSCOPY;  Service: Gastroenterology;  Laterality: N/A;    FAMILY HISTORY :   Family History  Problem Relation Age of Onset  . Hypertension Mother     SOCIAL HISTORY:   Social History  Substance Use Topics  . Smoking status: Former Smoker -- 4.50 packs/day for 15 years    Types: Cigarettes  . Smokeless tobacco: Not on file  . Alcohol Use: No    ALLERGIES:  is allergic to celebrex and penicillins.  MEDICATIONS:  Current Outpatient Prescriptions  Medication Sig Dispense Refill  . acetaminophen (TYLENOL) 500 MG tablet Take 1,000 mg by mouth every 6 (six) hours as needed for mild pain, fever or headache.     Marland Kitchen aspirin EC 81 MG tablet Take 81 mg by mouth at bedtime.    Marland Kitchen b complex vitamins capsule Take 1 capsule by mouth at bedtime.    Marland Kitchen diltiazem (TIAZAC) 360 MG 24 hr capsule Take 360 mg by mouth daily.    Marland Kitchen  Ipratropium-Albuterol (COMBIVENT RESPIMAT) 20-100 MCG/ACT AERS respimat Inhale 1 puff into the lungs every 6 (six) hours as needed for wheezing or shortness of breath.    . lidocaine-prilocaine (EMLA) cream Apply 1 application topically as needed (prior to accessing port).    . loratadine (CLARITIN) 10 MG tablet Take 10 mg by mouth daily as needed for allergies.     Marland Kitchen losartan (COZAAR) 100 MG tablet Take 100 mg by mouth at bedtime.    . magnesium oxide (MAG-OX) 400 (241.3 Mg) MG tablet Take 400 mg by mouth at bedtime.     . metoprolol tartrate (LOPRESSOR) 25 MG tablet Take 1 tablet  (25 mg total) by mouth 2 (two) times daily. 60 tablet 0  . nystatin (MYCOSTATIN) 100000 UNIT/ML suspension Take 5 mLs by mouth 4 (four) times daily as needed (for thrush).    Marland Kitchen omeprazole (PRILOSEC) 20 MG capsule Take 20 mg by mouth at bedtime.    . ondansetron (ZOFRAN) 8 MG tablet Take 8 mg by mouth every 8 (eight) hours as needed for nausea or vomiting.    . predniSONE (DELTASONE) 20 MG tablet 2 pills once a day with food 40 tablet 0  . prochlorperazine (COMPAZINE) 10 MG tablet Take 10 mg by mouth every 6 (six) hours as needed for nausea or vomiting.    . sulfamethoxazole-trimethoprim (BACTRIM DS,SEPTRA DS) 800-160 MG tablet 4 pills every 8 hours 240 tablet 0   No current facility-administered medications for this visit.    PHYSICAL EXAMINATION: ECOG PERFORMANCE STATUS: 0 - Asymptomatic  BP 113/69 mmHg  Pulse 97  Temp(Src) 97.4 F (36.3 C) (Tympanic)  Resp 18  Wt 245 lb 6 oz (111.3 kg)  Filed Weights   05/18/15 0858  Weight: 245 lb 6 oz (111.3 kg)    GENERAL: Well-nourished well-developed; Alert, no distress and comfortable.  Accompanied by his wife.  EYES: no pallor or icterus OROPHARYNX: no thrush or ulceration; good dentition  NECK: supple, no masses felt LYMPH:  no palpable lymphadenopathy in the cervical, axillary or inguinal regions LUNGS: decreased breath sounds to auscultation and  No wheeze or crackles HEART/CVS: regular rate & rhythm and no murmurs; No lower extremity edema ABDOMEN:abdomen soft, non-tender and normal bowel sounds Musculoskeletal:no cyanosis of digits and no clubbing  PSYCH: alert & oriented x 3 with fluent speech NEURO: no focal motor/sensory deficits SKIN:  no rashes or significant lesions; mediport accessed.   LABORATORY DATA:  I have reviewed the data as listed    Component Value Date/Time   NA 133* 05/18/2015 0840   NA 142 05/15/2015 1156   NA 139 12/19/2011 1044   K 5.5* 05/18/2015 0840   K 4.1 12/19/2011 1044   CL 103 05/18/2015 0840    CL 106 12/19/2011 1044   CO2 21* 05/18/2015 0840   CO2 26 12/19/2011 1044   GLUCOSE 305* 05/18/2015 0840   GLUCOSE 152* 05/15/2015 1156   GLUCOSE 95 12/19/2011 1044   BUN 26* 05/18/2015 0840   BUN 16 05/15/2015 1156   BUN 18 12/19/2011 1044   CREATININE 1.44* 05/18/2015 0840   CREATININE 1.06 12/19/2011 1044   CALCIUM 9.1 05/18/2015 0840   CALCIUM 9.0 12/19/2011 1044   PROT 7.0 05/18/2015 0840   PROT 6.1 05/15/2015 1156   PROT 9.0* 12/19/2011 1044   ALBUMIN 3.5 05/18/2015 0840   ALBUMIN 3.7 05/15/2015 1156   ALBUMIN 4.4 12/19/2011 1044   AST 27 05/18/2015 0840   AST 44* 12/19/2011 1044   ALT 22 05/18/2015 0840  ALT 46 12/19/2011 1044   ALKPHOS 94 05/18/2015 0840   ALKPHOS 110 12/19/2011 1044   BILITOT 0.2* 05/18/2015 0840   BILITOT 0.2 05/15/2015 1156   BILITOT 0.7 12/19/2011 1044   GFRNONAA 50* 05/18/2015 0840   GFRNONAA >60 12/19/2011 1044   GFRAA 58* 05/18/2015 0840   GFRAA >60 12/19/2011 1044    No results found for: SPEP, UPEP  Lab Results  Component Value Date   WBC 11.1* 05/18/2015   NEUTROABS 10.1* 05/18/2015   HGB 10.5* 05/18/2015   HCT 31.7* 05/18/2015   MCV 85.6 05/18/2015   PLT 632* 05/18/2015      Chemistry      Component Value Date/Time   NA 133* 05/18/2015 0840   NA 142 05/15/2015 1156   NA 139 12/19/2011 1044   K 5.5* 05/18/2015 0840   K 4.1 12/19/2011 1044   CL 103 05/18/2015 0840   CL 106 12/19/2011 1044   CO2 21* 05/18/2015 0840   CO2 26 12/19/2011 1044   BUN 26* 05/18/2015 0840   BUN 16 05/15/2015 1156   BUN 18 12/19/2011 1044   CREATININE 1.44* 05/18/2015 0840   CREATININE 1.06 12/19/2011 1044      Component Value Date/Time   CALCIUM 9.1 05/18/2015 0840   CALCIUM 9.0 12/19/2011 1044   ALKPHOS 94 05/18/2015 0840   ALKPHOS 110 12/19/2011 1044   AST 27 05/18/2015 0840   AST 44* 12/19/2011 1044   ALT 22 05/18/2015 0840   ALT 46 12/19/2011 1044   BILITOT 0.2* 05/18/2015 0840   BILITOT 0.2 05/15/2015 1156   BILITOT 0.7  12/19/2011 1044       ASSESSMENT & PLAN:   # DLBCL of pancreatic head-  Stage IE.Resolution after 3 cycles on PET scan. Currently status post 5 cycles of R CHOP chemotherapy.   # Hold cycle #6  [C discussion below]. Given the risk and benefits chemotherapy- given the limited stasis disease excellent response to chemotherapy; and the concerns for pneumocystis pneumonia recommend discontinuation of further chemotherapy.  # Pneumonia/question pneumocystis on a clinical basis- patient is on Bactrim double strength 3 pills thrice a day along with prednisone 40 mg. spoke to pulmonary and plan bronchoscopy early next week. Initiate a referral to infectious disease doctor; Dr.Fitzgerald.   # Hyperkalemia/ creatinine- 5.5/ 1.3-question spurious from Bactrim. Recommend repeating labs in 1 week.  Also recommend holding Cozaar. Call us if blood pressure elevated on May 15  # elevated Blood sugars- high- recommend close monitoring.  # Follow-up in about 2 weeks for reevaluation labs.      Cammie Sickle, MD 05/18/2015 9:28 AM

## 2015-05-19 LAB — LOWER RESPIRATORY CULTURE

## 2015-05-20 ENCOUNTER — Other Ambulatory Visit: Payer: Self-pay | Admitting: Oncology

## 2015-05-21 ENCOUNTER — Encounter: Payer: Self-pay | Admitting: *Deleted

## 2015-05-21 ENCOUNTER — Ambulatory Visit
Admission: RE | Admit: 2015-05-21 | Discharge: 2015-05-21 | Disposition: A | Discharge status: Home / Self Care | Payer: 59 | Source: Ambulatory Visit | Attending: Internal Medicine | Admitting: Internal Medicine

## 2015-05-21 ENCOUNTER — Encounter
Admission: RE | Disposition: A | Discharge status: Home / Self Care | Payer: Self-pay | Source: Ambulatory Visit | Attending: Internal Medicine

## 2015-05-21 DIAGNOSIS — Z87891 Personal history of nicotine dependence: Secondary | ICD-10-CM | POA: Insufficient documentation

## 2015-05-21 DIAGNOSIS — Z8572 Personal history of non-Hodgkin lymphomas: Secondary | ICD-10-CM | POA: Insufficient documentation

## 2015-05-21 DIAGNOSIS — M797 Fibromyalgia: Secondary | ICD-10-CM | POA: Insufficient documentation

## 2015-05-21 DIAGNOSIS — I10 Essential (primary) hypertension: Secondary | ICD-10-CM | POA: Insufficient documentation

## 2015-05-21 DIAGNOSIS — Z79899 Other long term (current) drug therapy: Secondary | ICD-10-CM | POA: Insufficient documentation

## 2015-05-21 DIAGNOSIS — M359 Systemic involvement of connective tissue, unspecified: Secondary | ICD-10-CM | POA: Insufficient documentation

## 2015-05-21 DIAGNOSIS — I482 Chronic atrial fibrillation: Secondary | ICD-10-CM | POA: Diagnosis not present

## 2015-05-21 DIAGNOSIS — Z888 Allergy status to other drugs, medicaments and biological substances status: Secondary | ICD-10-CM | POA: Diagnosis not present

## 2015-05-21 DIAGNOSIS — R0902 Hypoxemia: Secondary | ICD-10-CM | POA: Diagnosis not present

## 2015-05-21 DIAGNOSIS — G4730 Sleep apnea, unspecified: Secondary | ICD-10-CM | POA: Insufficient documentation

## 2015-05-21 DIAGNOSIS — R0602 Shortness of breath: Secondary | ICD-10-CM | POA: Insufficient documentation

## 2015-05-21 DIAGNOSIS — Z8673 Personal history of transient ischemic attack (TIA), and cerebral infarction without residual deficits: Secondary | ICD-10-CM | POA: Diagnosis not present

## 2015-05-21 DIAGNOSIS — J189 Pneumonia, unspecified organism: Secondary | ICD-10-CM | POA: Diagnosis not present

## 2015-05-21 DIAGNOSIS — K219 Gastro-esophageal reflux disease without esophagitis: Secondary | ICD-10-CM | POA: Insufficient documentation

## 2015-05-21 DIAGNOSIS — Z880 Allergy status to penicillin: Secondary | ICD-10-CM | POA: Insufficient documentation

## 2015-05-21 DIAGNOSIS — R918 Other nonspecific abnormal finding of lung field: Secondary | ICD-10-CM | POA: Diagnosis present

## 2015-05-21 DIAGNOSIS — Z8669 Personal history of other diseases of the nervous system and sense organs: Secondary | ICD-10-CM | POA: Diagnosis not present

## 2015-05-21 DIAGNOSIS — Z7901 Long term (current) use of anticoagulants: Secondary | ICD-10-CM | POA: Diagnosis not present

## 2015-05-21 DIAGNOSIS — Z7982 Long term (current) use of aspirin: Secondary | ICD-10-CM | POA: Diagnosis not present

## 2015-05-21 DIAGNOSIS — M1990 Unspecified osteoarthritis, unspecified site: Secondary | ICD-10-CM | POA: Diagnosis not present

## 2015-05-21 DIAGNOSIS — H4920 Sixth [abducent] nerve palsy, unspecified eye: Secondary | ICD-10-CM | POA: Insufficient documentation

## 2015-05-21 SURGERY — BRONCHOSCOPY, FLEXIBLE
Anesthesia: Moderate Sedation

## 2015-05-21 MED ORDER — FENTANYL CITRATE (PF) 100 MCG/2ML IJ SOLN
INTRAMUSCULAR | Status: AC
  Filled 2015-05-21: qty 4

## 2015-05-21 MED ORDER — MIDAZOLAM HCL 5 MG/5ML IJ SOLN
INTRAMUSCULAR | Status: AC
  Filled 2015-05-21: qty 10

## 2015-05-21 MED ORDER — DIAZEPAM 5 MG/ML IJ SOLN
INTRAMUSCULAR | Status: AC | PRN
  Administered 2015-05-21: 1 mg via INTRAVENOUS

## 2015-05-21 MED ORDER — FENTANYL CITRATE (PF) 100 MCG/2ML IJ SOLN
INTRAMUSCULAR | Status: AC | PRN
Start: 2015-05-21 — End: 2015-05-21
  Administered 2015-05-21: 25 ug via INTRAVENOUS
  Administered 2015-05-21: 50 ug via INTRAVENOUS

## 2015-05-21 MED ORDER — MIDAZOLAM HCL 2 MG/2ML IJ SOLN
INTRAMUSCULAR | Status: AC | PRN
Start: 2015-05-21 — End: 2015-05-21
  Administered 2015-05-21: 2 mg via INTRAVENOUS

## 2015-05-21 NOTE — Discharge Instructions (Signed)
Flexible Bronchoscopy, Care After Refer to this sheet in the next few weeks. These instructions provide you with information on caring for yourself after your procedure. Your health care provider may also give you more specific instructions. Your treatment has been planned according to current medical practices, but problems sometimes occur. Call your health care provider if you have any problems or questions after your procedure.  WHAT TO EXPECT AFTER THE PROCEDURE It is normal to have the following symptoms for 24-48 hours after the procedure:   Increased cough.  Low-grade fever.  Sore throat or hoarse voice.  Small streaks of blood in your thick spit (sputum) if tissue samples were taken (biopsy). HOME CARE INSTRUCTIONS   Do not eat or drink anything for 2 hours after your procedure. Your nose and throat were numbed by medicine. If you try to eat or drink before the medicine wears off, food or drink could go into your lungs or you could burn yourself. After the numbness is gone and your cough and gag reflexes have returned, you may eat soft food and drink liquids slowly.   The day after the procedure, you can go back to your normal diet.   You may resume normal activities.   Keep all follow-up visits as directed by your health care provider. It is important to keep all your appointments, especially if tissue samples were taken for testing (biopsy). SEEK IMMEDIATE MEDICAL CARE IF:   You have increasing shortness of breath.   You become light-headed or faint.   You have chest pain.   You have any new concerning symptoms.  You cough up more than a small amount of blood.  The amount of blood you cough up increases. MAKE SURE YOU:  Understand these instructions.  Will watch your condition.  Will get help right away if you are not doing well or get worse.   This information is not intended to replace advice given to you by your health care provider. Make sure you discuss  any questions you have with your health care provider.   Document Released: 07/12/2004 Document Revised: 01/13/2014 Document Reviewed: 08/27/2012 Elsevier Interactive Patient Education [employer]. The drugs you were given will stay in your system until tomorrow, so for the next 24 hours you should not.  Drive an automobile. Make any legal decisions.  Drink any alcoholic beverages.  Today you should start with liquids at 17:30 this afternoon and gradually work up to solid foods as your are able to tolerate them  Resume your regular medications as prescribed by your doctor.  Avoid aspirin for 24 hours.    Avoid strenuous activity for the remainder of the day.  Please notify your primary physician immediately if you have any unusual bleeding, trouble breathing, fever >100 degrees or pain not relieved by the medication your doctor prescribed for your doctor prescribed for you physician  If you cough or spit up 2 or more tablespoons of blood, come immediately to the Emergency room and tell them you had a bronchoscopy

## 2015-05-21 NOTE — Op Note (Signed)
[facility] Patient Name: Mason Houston Procedure Date: 05/21/2015 3:08 PM MRN: [id number] Account #: [id number] Date of Birth: 1950/04/02 Admit Type: Outpatient Age: 65 Room: [facility] PROCEDURE RM 01 Gender: Male Note Status: Finalized Attending MD: Patricia Pesa , MD Procedure:         Bronchoscopy Indications:       Bilateral infiltrate Providers:         Patricia Pesa, MD Referring MD:       Medicines:         Fentanyl 75 mcg IV, Midazolam 3 mg mg IV Complications:     No immediate complications Procedure:         Pre-Anesthesia Assessment:                    - A History and Physical has been performed. The patient's                     medications, allergies and sensitivities have been                     reviewed.                    After obtaining informed consent, the bronchoscope was                     passed under direct vision. Throughout the procedure, the                     patient's blood pressure, pulse, and oxygen saturations                     were monitored continuously. the Bronchoscope Olympus                     BF-Q180 S# [id number] was introduced through the mouth and                     advanced to the tracheobronchial tree of both lungs. The                     procedure was accomplished without difficulty. The patient                     tolerated the procedure fairly well. Findings:      The oropharynx appears normal. The larynx appears normal. The vocal       cords appear normal. The subglottic space is normal. The trachea is of       normal caliber. The carina is sharp. The tracheobronchial tree was       examined to at least the first subsegmental level. Bronchial mucosa and       anatomy are normal; there are no endobronchial lesions, and no       secretions. Impression:        - Bilateral infiltrate                    - The examination was normal.                    - No specimens collected.                    -  Pneumonia Recommendation:    - Await BAL results. Patricia Pesa MD Patricia Pesa, MD 05/21/2015 3:22:30 PM This  report has been signed electronically. Number of Addenda: 0 Note Initiated On: 05/21/2015 3:08 PM      [facility]

## 2015-05-22 ENCOUNTER — Encounter: Payer: Self-pay | Admitting: Family Medicine

## 2015-05-22 ENCOUNTER — Ambulatory Visit (INDEPENDENT_AMBULATORY_CARE_PROVIDER_SITE_OTHER): Payer: 59 | Admitting: Family Medicine

## 2015-05-22 VITALS — BP 112/60 | HR 99 | Temp 98.4°F | Resp 18 | Ht 68.0 in | Wt 243.0 lb

## 2015-05-22 DIAGNOSIS — J189 Pneumonia, unspecified organism: Secondary | ICD-10-CM

## 2015-05-22 NOTE — H&P (Signed)
PULMONARY   PATIENT NAME: Mason Houston    MR#:  [id number]  DATE OF BIRTH:  09-24-1950  DATE OF ADMISSION:  05/21/2015  PRIMARY CARE PHYSICIAN: Mason Rake, MD   REQUESTING/REFERRING PHYSICIAN: Dr. Joni Houston  CHIEF COMPLAINT:   Increasing shortness of breath for 2-3 days HISTORY OF PRESENT ILLNESS:  Mason Houston  is a 65 y.o. male with a known history of diffuse large B cell lymphoma, GERD, A. fib chronic was on Xarelto now off of it since patient developed hematuria a few days ago, hypertension, TIAs, obstructive jaundice status post stent in the CBD comes to the emergency room with increasing shortness of breath low-grade fever 100.4 cough and was found to have diffuse pneumonitis/interstitial infiltrates on CT scan.  Patient received IV Levaquin. He was found to be hypoxic with sats 88-89% on room air. She currently is on 2 L sats are 96%. His heart rate is in the 100s A. fib intermittent. Patient is being admitted for further eval showed management. Lactic acid within normal limits white count 8.5.  Patient is currently getting chemotherapy for his diffuse large B-cell lymphoma.  PAST MEDICAL HISTORY:   Past Medical History  Diagnosis Date  . GERD (gastroesophageal reflux disease)   . Osteoarthrosis, unspecified whether generalized or localized, lower leg   . H/O Bell's palsy   . History of [place] measles   . Atrial fibrillation ([place])   . Hypertension   . Mini stroke ([place])   . Sixth cranial nerve palsy   . Fibromyalgia   . Collagen vascular disease ([place])   . Cancer ([place])   . Stroke ([place])   . Sleep apnea     PAST SURGICAL HISTOIRY:   Past Surgical History  Procedure Laterality Date  . Cyst excision    . Trigger finger release    . Ercp N/A 12/29/2014    Procedure: ENDOSCOPIC RETROGRADE CHOLANGIOPANCREATOGRAPHY (ERCP);  Surgeon: Mason Luster, MD;  Location: [facility] ENDOSCOPY;  Service: Endoscopy;  Laterality: N/A;  . Peripheral vascular catheterization N/A 01/31/2015     Procedure: Porta Cath Insertion;  Surgeon: Katha Cabal, MD;  Location: [place] CV LAB;  Service: Cardiovascular;  Laterality: N/A;  . Ercp N/A 02/21/2015    Procedure: ENDOSCOPIC RETROGRADE CHOLANGIOPANCREATOGRAPHY (ERCP);  Surgeon: Mason Luster, MD;  Location: [facility] ENDOSCOPY;  Service: Gastroenterology;  Laterality: N/A;    SOCIAL HISTORY:   Social History  Substance Use Topics  . Smoking status: Former Smoker -- 4.50 packs/day for 15 years    Types: Cigarettes  . Smokeless tobacco: Not on file  . Alcohol Use: No    FAMILY HISTORY:   Family History  Problem Relation Age of Onset  . Hypertension Mother     DRUG ALLERGIES:   Allergies  Allergen Reactions  . Celebrex [Celecoxib] Hives  . Penicillins Hives and Other (See Comments)    Has patient had a PCN reaction causing immediate rash, facial/tongue/throat swelling, SOB or lightheadedness with hypotension: No Has patient had a PCN reaction causing severe rash involving mucus membranes or skin necrosis: No Has patient had a PCN reaction that required hospitalization No Has patient had a PCN reaction occurring within the last 10 years: No If all of the above answers are "NO", then may proceed with Cephalosporin use.    REVIEW OF SYSTEMS:  Review of Systems  Constitutional: Positive for fever. Negative for chills and weight loss.  HENT: Negative for ear discharge, ear pain and nosebleeds.   Eyes: Negative for blurred vision,  pain and discharge.  Respiratory: Positive for cough and shortness of breath. Negative for sputum production, wheezing and stridor.   Cardiovascular: Negative for chest pain, palpitations, orthopnea and PND.  Gastrointestinal: Negative for nausea, vomiting, abdominal pain and diarrhea.  Genitourinary: Negative for urgency and frequency.  Musculoskeletal: Negative for back pain and joint pain.  Neurological: Positive for weakness. Negative for sensory change, speech change and focal weakness.   Psychiatric/Behavioral: Negative for depression and hallucinations. The patient is not nervous/anxious.   All other systems reviewed and are negative.    MEDICATIONS AT HOME:   Prior to Admission medications   Medication Sig Start Date End Date Taking? Authorizing Provider  diltiazem (TIAZAC) 360 MG 24 hr capsule Take 360 mg by mouth daily.   Yes Historical Provider, MD  loratadine (CLARITIN) 10 MG tablet Take 10 mg by mouth daily.   Yes Historical Provider, MD  magnesium oxide (MAG-OX) 400 (241.3 Mg) MG tablet Take 400 mg by mouth daily.   Yes Historical Provider, MD  acetaminophen (TYLENOL) 500 MG tablet Take 1,000 mg by mouth every 6 (six) hours as needed.    Historical Provider, MD  aspirin EC 81 MG tablet Take 1 tablet (81 mg total) by mouth daily. 05/03/15   Wellington Hampshire, MD  B Complex-C (SUPER B COMPLEX PO) Take by mouth daily.    Historical Provider, MD  Chlorphen-Pseudoephed-APAP (CORICIDIN D PO) Take by mouth.    Historical Provider, MD  Ipratropium-Albuterol (COMBIVENT) 20-100 MCG/ACT AERS respimat Inhale 1 puff into the lungs every 6 (six) hours. 04/23/15   Mayra Reel, NP  lidocaine-prilocaine (EMLA) cream Apply cream 1 hour before chemotherapy treatment and place saran wrap over the cream to protect clothing 01/30/15   Dmitriy Berenzon, MD  losartan (COZAAR) 100 MG tablet Take 1 tablet (100 mg total) by mouth daily. 02/05/15   Wellington Hampshire, MD  nystatin (MYCOSTATIN) 100000 UNIT/ML suspension Take 5 mLs (500,000 Units total) by mouth 4 (four) times daily as needed. 03/23/15   Cammie Sickle, MD  omeprazole (PRILOSEC) 20 MG capsule Take 1 capsule (20 mg total) by mouth daily. 03/05/15   Cammie Sickle, MD  ondansetron (ZOFRAN) 8 MG tablet Take 1 tablet (8 mg total) by mouth every 8 (eight) hours. Start on D3 of each cycle, continue x7d, then use as needed every 8 hrs 02/05/15   Dmitriy Berenzon, MD  predniSONE (DELTASONE) 20 MG tablet Take 4 tablets daily with food,  for first week of treatment of chemo. 04/06/15   Cammie Sickle, MD  prochlorperazine (COMPAZINE) 10 MG tablet Take 1 tablet (10 mg total) by mouth every 6 (six) hours as needed for nausea or vomiting. Use between zofran 02/02/15   Dmitriy Berenzon, MD      VITAL SIGNS:  Blood pressure 109/63, pulse 68, temperature 98.8 F (37.1 C), temperature source Oral, resp. rate 18, height 5\' 7"  (1.702 m), weight 242 lb (109.77 kg), SpO2 97 %.  PHYSICAL EXAMINATION:  GENERAL:  65 y.o.-year-old patient lying in the bed with no acute distress. obese EYES: Pupils equal, round, reactive to light and accommodation. No scleral icterus. Extraocular muscles intact.  HEENT: Head atraumatic, normocephalic. Oropharynx and nasopharynx clear.  NECK:  Supple, no jugular venous distention. No thyroid enlargement, no tenderness.  LUNGS: Normal breath sounds bilaterally, no wheezing, rales,rhonchi or crepitation. No use of accessory muscles of respiration.  CARDIOVASCULAR: S1, S2 normal. No murmurs, rubs, or gallops. tachycardia ABDOMEN: Soft, nontender, nondistended. Bowel sounds present. No organomegaly  or mass.  EXTREMITIES: No pedal edema, cyanosis, or clubbing.  NEUROLOGIC: Cranial nerves II through XII are intact. Muscle strength 5/5 in all extremities. Sensation intact. Gait not checked.  PSYCHIATRIC: The patient is alert and oriented x 3.  SKIN: No obvious rash, lesion, or ulcer.   LABORATORY PANEL:   CBC  Recent Labs Lab 05/18/15 0840  WBC 11.1*  HGB 10.5*  HCT 31.7*  PLT 632*   ------------------------------------------------------------------------------------------------------------------  Chemistries   Recent Labs Lab 05/18/15 0840  NA 133*  K 5.5*  CL 103  CO2 21*  GLUCOSE 305*  BUN 26*  CREATININE 1.44*  CALCIUM 9.1  AST 27  ALT 22  ALKPHOS 94  BILITOT 0.2*   RADIOLOGY:  No results found.  EKG:  Sinus tachycardia with intermittent A. fib on telemetry  IMPRESSION AND  PLAN:   Mason Houston  is a 65 y.o. male with a known history of diffuse large B cell lymphoma, GERD, A. fib chronic was on Xarelto now off of it since patient developed hematuria a few days ago, hypertension, TIAs, obstructive jaundice status post stent in the CBD comes to the emergency room with increasing shortness of breath low-grade fever 100.4 cough and was found to have diffuse pneumonitis/interstitial infiltrates on CT scan.   -Progressive increase in shortness of breath with hypoxia -CT scan suggesting diffuse bilateral infiltrates   Plan for BAL Bronchscopy  [facility], M.D.  [facility]  Medical Director [place] Director [place] Department

## 2015-05-22 NOTE — Progress Notes (Signed)
Name: Mason Houston   MRN: [id number]    DOB: 05-Jan-1951   Date:05/22/2015       Progress Note  Subjective  Chief Complaint  Chief Complaint  Patient presents with  . Pneumonia    follow up  . Hypertension    was taken off BP medication     Pneumonia There is no cough, difficulty breathing, hemoptysis, shortness of breath or sputum production. Primary symptoms comments: Started on Bactrim and Prednisone by Oncologist, concern for Pneumocystic. Feels much better.. The problem has been gradually improving.     Past Medical History  Diagnosis Date  . GERD (gastroesophageal reflux disease)   . Osteoarthrosis, unspecified whether generalized or localized, lower leg   . H/O Bell's palsy   . History of [place] measles   . Atrial fibrillation ([place])   . Hypertension   . Mini stroke ([place])   . Sixth cranial nerve palsy   . Fibromyalgia   . Collagen vascular disease ([place])   . Cancer ([place])   . Stroke ([place])   . Sleep apnea     Past Surgical History  Procedure Laterality Date  . Cyst excision    . Trigger finger release    . Ercp N/A 12/29/2014    Procedure: ENDOSCOPIC RETROGRADE CHOLANGIOPANCREATOGRAPHY (ERCP);  Surgeon: Hulen Luster, MD;  Location: [facility] ENDOSCOPY;  Service: Endoscopy;  Laterality: N/A;  . Peripheral vascular catheterization N/A 01/31/2015    Procedure: Porta Cath Insertion;  Surgeon: Katha Cabal, MD;  Location: [place] CV LAB;  Service: Cardiovascular;  Laterality: N/A;  . Ercp N/A 02/21/2015    Procedure: ENDOSCOPIC RETROGRADE CHOLANGIOPANCREATOGRAPHY (ERCP);  Surgeon: Hulen Luster, MD;  Location: [facility] ENDOSCOPY;  Service: Gastroenterology;  Laterality: N/A;    Family History  Problem Relation Age of Onset  . Hypertension Mother     Social History   Social History  . Marital Status: Married    Spouse Name: N/A  . Number of Children: N/A  . Years of Education: N/A   Occupational History  . Not on file.   Social History Main Topics  . Smoking  status: Former Smoker -- 4.50 packs/day for 15 years    Types: Cigarettes  . Smokeless tobacco: Not on file  . Alcohol Use: No  . Drug Use: Yes    Special: Marijuana     Comment: PAST  . Sexual Activity: Not on file   Other Topics Concern  . Not on file   Social History Narrative    No current outpatient prescriptions on file.  Allergies  Allergen Reactions  . Celebrex [Celecoxib] Hives  . Penicillins Hives and Other (See Comments)    Has patient had a PCN reaction causing immediate rash, facial/tongue/throat swelling, SOB or lightheadedness with hypotension: No Has patient had a PCN reaction causing severe rash involving mucus membranes or skin necrosis: No Has patient had a PCN reaction that required hospitalization No Has patient had a PCN reaction occurring within the last 10 years: No If all of the above answers are "NO", then may proceed with Cephalosporin use.     Review of Systems  Respiratory: Negative for cough, hemoptysis, sputum production and shortness of breath.      Objective  Filed Vitals:   05/22/15 0841  BP: 112/60  Pulse: 99  Temp: 98.4 F (36.9 C)  TempSrc: Oral  Resp: 18  Height: 5\' 8"  (1.727 m)  Weight: 243 lb (110.224 kg)  SpO2: 95%    Physical Exam  Constitutional:  He is oriented to person, place, and time and well-developed, well-nourished, and in no distress.  Cardiovascular: Normal rate and regular rhythm.   Pulmonary/Chest: He has no decreased breath sounds. He has wheezes.  Soft expiratory wheezes on auscultation.  Abdominal: Bowel sounds are normal. There is no tenderness.  Neurological: He is alert and oriented to person, place, and time.  Nursing note and vitals reviewed.     Assessment & Plan  1. pneumonia Being managed by oncology and now on Bactrim and prednisone. Reviewed notes from oncology and they're considering referral to infectious disease, however patient is clinically much improved. We'll continue to  follow.     As[facility] 05/22/2015 8:56 AM

## 2015-05-23 ENCOUNTER — Inpatient Hospital Stay: Payer: 59

## 2015-05-23 DIAGNOSIS — C8330 Diffuse large B-cell lymphoma, unspecified site: Secondary | ICD-10-CM

## 2015-05-23 LAB — BASIC METABOLIC PANEL
Anion gap: 8 (ref 5–15)
BUN: 29 mg/dL — ABNORMAL HIGH (ref 6–20)
CO2: 25 mmol/L (ref 22–32)
Calcium: 9.2 mg/dL (ref 8.9–10.3)
Chloride: 101 mmol/L (ref 101–111)
Creatinine, Ser: 1.07 mg/dL (ref 0.61–1.24)
GFR calc Af Amer: >60 mL/min (ref >60)
GFR calc non Af Amer: >60 mL/min (ref >60)
Glucose, Bld: 118 mg/dL — ABNORMAL HIGH (ref 65–99)
Potassium: 5.3 mmol/L — ABNORMAL HIGH (ref 3.5–5.1)
Sodium: 134 mmol/L — ABNORMAL LOW (ref 135–145)

## 2015-05-23 LAB — CULTURE, BAL-QUANTITATIVE

## 2015-05-23 LAB — CULTURE, BAL-QUANTITATIVE W GRAM STAIN: Culture: NORMAL

## 2015-05-24 ENCOUNTER — Other Ambulatory Visit: Payer: Self-pay | Admitting: Internal Medicine

## 2015-05-24 ENCOUNTER — Telehealth: Payer: Self-pay | Admitting: *Deleted

## 2015-05-24 NOTE — Telephone Encounter (Addendum)
-----   Message from Cammie Sickle, MD sent at 05/24/2015  4:32 PM EDT ----- Please Inform patient that his potassium is slightly better/kidney numbers slightly better. Continue to hold off blood pressure medication; continue increased fluid. Also inform regarding ID referral. Continue taking Bactrim/prednisone-not to stop until directed. Recheck labs-cbc/bmp at next visit/follow-up   Patient directions above provided to patient. We are waiting for an apt with Dr. Ola Spurr. I told the patient that I contacted [facility] internal med this afternoon. As of today, the referral has not been made. I asked [place] if the dept received the referral. The receptionist is unable to confirm this. She will have the RN for Dr. Ola Spurr contact the cancer ctr as soon as possible. Pt notified that if he does not hear back by tomorrow morning by noon re: the ref to Dr. Ola Spurr, he is to call our office back.

## 2015-05-25 NOTE — Telephone Encounter (Signed)
Contacted pt.  Dr. Ola Spurr can see the patient on 06/06/15.  I do not have an assigned apt time for this date. I checked with the [facility] internal medicine dept. This apt will be scheduled and [place] will contact patient with appointment time. Pt made aware. He has approximately 3 weeks left of bactrim in bottle. Pt instructed that md would like pt on continuous abx. I asked pt to let Dr. Rogue Bussing know if he runs out sooner with antibiotics than anticipated.   Teach back process performed.

## 2015-05-28 ENCOUNTER — Other Ambulatory Visit: Payer: Self-pay | Admitting: Internal Medicine

## 2015-05-28 ENCOUNTER — Ambulatory Visit (INDEPENDENT_AMBULATORY_CARE_PROVIDER_SITE_OTHER): Payer: 59 | Admitting: Cardiovascular Disease

## 2015-05-28 ENCOUNTER — Encounter: Payer: Self-pay | Admitting: Cardiovascular Disease

## 2015-05-28 VITALS — BP 110/62 | HR 72 | Ht 67.0 in | Wt 246.0 lb

## 2015-05-28 DIAGNOSIS — I480 Paroxysmal atrial fibrillation: Secondary | ICD-10-CM

## 2015-05-28 MED ORDER — METOPROLOL TARTRATE 25 MG PO TABS: 25.0000 mg | ORAL_TABLET | Freq: Two times a day (BID) | ORAL | Status: DC

## 2015-05-28 NOTE — Progress Notes (Signed)
Cardiology Office Note   Date:  05/28/2015   ID:  Mason Houston, DOB Oct 05, 1950, MRN [id number]  PCP:  Keith Rake, MD  Cardiologist:   Kathlyn Sacramento, MD   Chief Complaint  Patient presents with  . other    6 month f/u no complaints. Meds reviewed verbally.      History of Present Illness: Mason Houston is a 65 y.o. male who presents for a followup visit regarding paroxysmal atrial fibrillation.  The patient has a long history of well-controlled hypertension and obesity. He was diagnosed with atrial fibrillation with rapid ventricular response during a routine physical  in December of 2013 in the setting of heavy caffeine intake. He also has sleep apnea currently effectively treated with CPAP.  He had a stroke in August 2015. Since then, he has been on anticoagulation with  Xarelto. Since last visit, he was diagnosed with non-Hodgkin's lymphoma and was started on chemotherapy with subsequent improvement. He was hospitalized for pneumonia which was difficult to treat but this has gradually improved. Blood pressure has been running relatively low and losartan was discontinued. Metoprolol was added for sinus tachycardia. He continues to be on diltiazem. Anticoagulation was held due to hematuria. He is now feeling better overall with no chest pain, shortness of breath or palpitations.   Past Medical History  Diagnosis Date  . GERD (gastroesophageal reflux disease)   . Osteoarthrosis, unspecified whether generalized or localized, lower leg   . H/O Bell's palsy   . History of [place] measles   . Atrial fibrillation ([place])   . Hypertension   . Mini stroke ([place])   . Sixth cranial nerve palsy   . Fibromyalgia   . Collagen vascular disease ([place])   . Cancer ([place])   . Stroke ([place])   . Sleep apnea     Past Surgical History  Procedure Laterality Date  . Cyst excision    . Trigger finger release    . Ercp N/A 12/29/2014    Procedure: ENDOSCOPIC RETROGRADE CHOLANGIOPANCREATOGRAPHY  (ERCP);  Surgeon: Hulen Luster, MD;  Location: [facility] ENDOSCOPY;  Service: Endoscopy;  Laterality: N/A;  . Peripheral vascular catheterization N/A 01/31/2015    Procedure: Porta Cath Insertion;  Surgeon: Katha Cabal, MD;  Location: [place] CV LAB;  Service: Cardiovascular;  Laterality: N/A;  . Ercp N/A 02/21/2015    Procedure: ENDOSCOPIC RETROGRADE CHOLANGIOPANCREATOGRAPHY (ERCP);  Surgeon: Hulen Luster, MD;  Location: [facility] ENDOSCOPY;  Service: Gastroenterology;  Laterality: N/A;     Current Outpatient Prescriptions  Medication Sig Dispense Refill  . acetaminophen (TYLENOL) 500 MG tablet Take 1,000 mg by mouth every 6 (six) hours as needed for mild pain, fever or headache.     . b complex vitamins capsule Take 1 capsule by mouth at bedtime.    Marland Kitchen diltiazem (TIAZAC) 360 MG 24 hr capsule Take 360 mg by mouth daily.    . Ipratropium-Albuterol (COMBIVENT RESPIMAT) 20-100 MCG/ACT AERS respimat Inhale 1 puff into the lungs every 6 (six) hours as needed for wheezing or shortness of breath.    . lidocaine-prilocaine (EMLA) cream Apply 1 application topically as needed (prior to accessing port).    . loratadine (CLARITIN) 10 MG tablet Take 10 mg by mouth daily as needed for allergies.     . magnesium oxide (MAG-OX) 400 (241.3 Mg) MG tablet Take 400 mg by mouth at bedtime.     . metoprolol tartrate (LOPRESSOR) 25 MG tablet Take 1 tablet (25 mg total) by mouth 2 (two)  times daily. 60 tablet 3  . nystatin (MYCOSTATIN) 100000 UNIT/ML suspension Take 5 mLs by mouth 4 (four) times daily as needed (for thrush).    Marland Kitchen omeprazole (PRILOSEC) 20 MG capsule Take 20 mg by mouth at bedtime.    . ondansetron (ZOFRAN) 8 MG tablet Take 8 mg by mouth every 8 (eight) hours as needed for nausea or vomiting. Reported on 05/21/2015    . predniSONE (DELTASONE) 20 MG tablet 2 pills once a day with food 40 tablet 0  . sulfamethoxazole-trimethoprim (BACTRIM DS,SEPTRA DS) 800-160 MG tablet 4 pills every 8 hours 240 tablet 0   No  current facility-administered medications for this visit.    Allergies:   Celebrex and Penicillins    Social History:  The patient  reports that he has quit smoking. His smoking use included Cigarettes. He has a 67.5 pack-year smoking history. He does not have any smokeless tobacco history on file. He reports that he uses illicit drugs (Marijuana). He reports that he does not drink alcohol.   Family History:  The patient's family history includes Hypertension in his mother.    ROS:  Please see the history of present illness.   Otherwise, review of systems are positive for none.   All other systems are reviewed and negative.    PHYSICAL EXAM: VS:  BP 110/62 mmHg  Pulse 72  Ht 5\' 7"  (1.702 m)  Wt 246 lb (111.585 kg)  BMI 38.52 kg/m2 , BMI Body mass index is 38.52 kg/(m^2). GEN: Well nourished, well developed, in no acute distress HEENT: normal Neck: no JVD, carotid bruits, or masses Cardiac: RRR; no murmurs, rubs, or gallops,no edema  Respiratory:  clear to auscultation bilaterally, normal work of breathing GI: soft, nontender, nondistended, + BS MS: no deformity or atrophy Skin: warm and dry, no rash Neuro:  Strength and sensation are intact Psych: euthymic mood, full affect   EKG:  EKG is ordered today. The ekg ordered today demonstrates normal sinus rhythm with no significant ST or T wave changes. Heart rate is 72 bpm.   Recent Labs: 05/18/2015: ALT 22; Hemoglobin 10.5*; Platelets 632* 05/23/2015: BUN 29*; Creatinine, Ser 1.07; Potassium 5.3*; Sodium 134*    Lipid Panel No results found for: CHOL, TRIG, HDL, CHOLHDL, VLDL, LDLCALC, LDLDIRECT    Wt Readings from Last 3 Encounters:  05/28/15 246 lb (111.585 kg)  05/22/15 243 lb (110.224 kg)  05/21/15 242 lb (109.77 kg)        ASSESSMENT AND PLAN:  1.  Paroxysmal atrial fibrillation: Currently maintaining in sinus rhythm. Continue diltiazem and metoprolol. Anticoagulation is currently on hold due to recent  hematuria and anemia. I am hoping that anticoagulation will be resumed in the next few months.  2. Essential hypertension: Blood pressure is controlled without losartan.    Disposition:   FU with me in 3 months  Signed,  Kathlyn Sacramento, MD  05/28/2015 5:40 PM    [place]

## 2015-05-28 NOTE — Patient Instructions (Signed)
Medication Instructions:  Your physician recommends that you continue on your current medications as directed. Please refer to the Current Medication list given to you today.   Labwork: none  Testing/Procedures: none  Follow-Up: Your physician recommends that you schedule a follow-up appointment in: three months with Dr. Arida.    Any Other Special Instructions Will Be Listed Below (If Applicable).     If you need a refill on your cardiac medications before your next appointment, please call your pharmacy.   

## 2015-05-31 LAB — ACID FAST SMEAR (AFB, MYCOBACTERIA): Acid Fast Smear: NEGATIVE

## 2015-05-31 LAB — ACID FAST CULTURE WITH REFLEXED SENSITIVITIES (MYCOBACTERIA)

## 2015-06-01 ENCOUNTER — Inpatient Hospital Stay (HOSPITAL_BASED_OUTPATIENT_CLINIC_OR_DEPARTMENT_OTHER): Payer: 59 | Admitting: Internal Medicine

## 2015-06-01 ENCOUNTER — Other Ambulatory Visit: Payer: Self-pay | Admitting: *Deleted

## 2015-06-01 ENCOUNTER — Inpatient Hospital Stay: Payer: Self-pay

## 2015-06-01 VITALS — BP 146/76 | HR 80 | Temp 97.9°F | Resp 18 | Wt 253.5 lb

## 2015-06-01 DIAGNOSIS — C8330 Diffuse large B-cell lymphoma, unspecified site: Secondary | ICD-10-CM

## 2015-06-01 DIAGNOSIS — E875 Hyperkalemia: Secondary | ICD-10-CM | POA: Diagnosis not present

## 2015-06-01 DIAGNOSIS — Z9221 Personal history of antineoplastic chemotherapy: Secondary | ICD-10-CM | POA: Diagnosis not present

## 2015-06-01 DIAGNOSIS — Z79899 Other long term (current) drug therapy: Secondary | ICD-10-CM

## 2015-06-01 DIAGNOSIS — J189 Pneumonia, unspecified organism: Secondary | ICD-10-CM | POA: Diagnosis not present

## 2015-06-01 DIAGNOSIS — C8339 Diffuse large B-cell lymphoma, extranodal and solid organ sites: Secondary | ICD-10-CM

## 2015-06-01 LAB — CBC WITH DIFFERENTIAL/PLATELET
Basophils Absolute: 0 10*3/uL (ref 0–0.1)
Basophils Relative: 0 %
Eosinophils Absolute: 0 10*3/uL (ref 0–0.7)
Eosinophils Relative: 0 %
HCT: 34.4 % — ABNORMAL LOW (ref 40.0–52.0)
Hemoglobin: 11.5 g/dL — ABNORMAL LOW (ref 13.0–18.0)
Lymphocytes Relative: 5 %
Lymphs Abs: 0.6 10*3/uL — ABNORMAL LOW (ref 1.0–3.6)
MCH: 29.5 pg (ref 26.0–34.0)
MCHC: 33.4 g/dL (ref 32.0–36.0)
MCV: 88.3 fL (ref 80.0–100.0)
Monocytes Absolute: 0.9 10*3/uL (ref 0.2–1.0)
Monocytes Relative: 7 %
Neutro Abs: 10.5 10*3/uL — ABNORMAL HIGH (ref 1.4–6.5)
Neutrophils Relative %: 88 %
Platelets: 303 10*3/uL (ref 150–440)
RBC: 3.9 MIL/uL — ABNORMAL LOW (ref 4.40–5.90)
RDW: 18.7 % — ABNORMAL HIGH (ref 11.5–14.5)
WBC: 12 10*3/uL — ABNORMAL HIGH (ref 3.8–10.6)

## 2015-06-01 LAB — BASIC METABOLIC PANEL
Anion gap: 7 (ref 5–15)
BUN: 27 mg/dL — ABNORMAL HIGH (ref 6–20)
CO2: 25 mmol/L (ref 22–32)
Calcium: 9.2 mg/dL (ref 8.9–10.3)
Chloride: 99 mmol/L — ABNORMAL LOW (ref 101–111)
Creatinine, Ser: 1.07 mg/dL (ref 0.61–1.24)
GFR calc Af Amer: >60 mL/min (ref >60)
GFR calc non Af Amer: >60 mL/min (ref >60)
Glucose, Bld: 139 mg/dL — ABNORMAL HIGH (ref 65–99)
Potassium: 5.3 mmol/L — ABNORMAL HIGH (ref 3.5–5.1)
Sodium: 131 mmol/L — ABNORMAL LOW (ref 135–145)

## 2015-06-01 NOTE — Progress Notes (Signed)
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 A.fib- off xarelto [mild blood in urine]  INTERVAL HISTORY:  65 year old  Male patient with above history of diffuse large B-cell lymphoma stage IE peripancreatic region currently R CHOP  Status post 5 treatments is here for follow-up.  Patient has been on Bactrim for the last 2 weeks; he also had bronchoscopy approximately 10 days ago.  He continues to notice significant improvement in his breathing; coughing improved. However is complaining of weight gain. Denies any skin rash.   No nausea no vomiting. Appetite is good.Patient denies any tingling and numbness in the hand and feet.  His appetite is good.  REVIEW OF SYSTEMS:  A complete 10 point review of system is done which is negative except mentioned above/history of present illness.   PAST MEDICAL HISTORY :  Past Medical History  Diagnosis Date  . GERD (gastroesophageal reflux disease)   . Osteoarthrosis, unspecified whether generalized or localized, lower leg   . H/O Bell's palsy   . History of [place] measles   . Atrial fibrillation ([place])   . Hypertension   . Mini stroke ([place])   . Sixth cranial nerve palsy   . Fibromyalgia   . Collagen vascular disease ([place])   . Cancer ([place])   . Stroke ([place])   . Sleep apnea     PAST SURGICAL HISTORY :   Past Surgical History  Procedure Laterality Date  . Cyst excision    . Trigger finger release    . Ercp N/A 12/29/2014    Procedure:  ENDOSCOPIC RETROGRADE CHOLANGIOPANCREATOGRAPHY (ERCP);  Surgeon: Hulen Luster, MD;  Location: [facility] ENDOSCOPY;  Service: Endoscopy;  Laterality: N/A;  . Peripheral vascular catheterization N/A 01/31/2015    Procedure: Porta Cath Insertion;  Surgeon: Katha Cabal, MD;  Location: [place] [facility];  Service: Cardiovascular;  Laterality: N/A;  . Ercp N/A 02/21/2015    Procedure: ENDOSCOPIC RETROGRADE CHOLANGIOPANCREATOGRAPHY (ERCP);  Surgeon: Hulen Luster, MD;  Location: [facility] ENDOSCOPY;  Service: Gastroenterology;  Laterality: N/A;    FAMILY HISTORY :   Family History  Problem Relation Age of Onset  . Hypertension Mother     SOCIAL HISTORY:   Social History  Substance Use Topics  . Smoking status: Former Smoker -- 4.50 packs/day for 15 years    Types: Cigarettes  . Smokeless tobacco: Not on file  . Alcohol Use: No    ALLERGIES:  is allergic to celebrex and penicillins.  MEDICATIONS:  Current Outpatient Prescriptions  Medication Sig Dispense Refill  . acetaminophen (TYLENOL) 500 MG tablet Take 1,000 mg by mouth every 6 (six) hours as needed for mild pain, fever or headache.     . b complex vitamins capsule Take 1 capsule by mouth at bedtime.    Marland Kitchen diltiazem (TIAZAC) 360 MG 24 hr capsule Take 360 mg by mouth daily.    . Ipratropium-Albuterol (COMBIVENT RESPIMAT) 20-100 MCG/ACT AERS respimat Inhale 1 puff into the lungs every 6 (six) hours as needed  for wheezing or shortness of breath.    . lidocaine-prilocaine (EMLA) cream Apply 1 application topically as needed (prior to accessing port).    . loratadine (CLARITIN) 10 MG tablet Take 10 mg by mouth daily as needed for allergies.     . magnesium oxide (MAG-OX) 400 (241.3 Mg) MG tablet Take 400 mg by mouth at bedtime.     . metoprolol tartrate (LOPRESSOR) 25 MG tablet Take 1 tablet (25 mg total) by mouth 2 (two) times daily. 60 tablet 3  . nystatin (MYCOSTATIN) 100000 UNIT/ML suspension Take 5 mLs by mouth 4 (four) times daily as needed (for  thrush).    Marland Kitchen omeprazole (PRILOSEC) 20 MG capsule Take 20 mg by mouth at bedtime.    . ondansetron (ZOFRAN) 8 MG tablet Take 8 mg by mouth every 8 (eight) hours as needed for nausea or vomiting. Reported on 05/21/2015    . predniSONE (DELTASONE) 20 MG tablet 2 pills once a day with food 40 tablet 0  . sulfamethoxazole-trimethoprim (BACTRIM DS,SEPTRA DS) 800-160 MG tablet 4 pills every 8 hours 240 tablet 0   No current facility-administered medications for this visit.    PHYSICAL EXAMINATION: ECOG PERFORMANCE STATUS: 0 - Asymptomatic  BP 146/76 mmHg  Pulse 80  Temp(Src) 97.9 F (36.6 C) (Tympanic)  Resp 18  Wt 253 lb 8.5 oz (115 kg)  Filed Weights   06/01/15 0948  Weight: 253 lb 8.5 oz (115 kg)    GENERAL: Well-nourished well-developed; Alert, no distress and comfortable. He is alone. EYES: no pallor or icterus OROPHARYNX: no thrush or ulceration; good dentition  NECK: supple, no masses felt LYMPH:  no palpable lymphadenopathy in the cervical, axillary or inguinal regions LUNGS: decreased breath sounds to auscultation and  No wheeze or crackles HEART/CVS: regular rate & rhythm and no murmurs; No lower extremity edema ABDOMEN:abdomen soft, non-tender and normal bowel sounds Musculoskeletal:no cyanosis of digits and no clubbing  PSYCH: alert & oriented x 3 with fluent speech NEURO: no focal motor/sensory deficits SKIN:  no rashes or significant lesions; mediport accessed.   LABORATORY DATA:  I have reviewed the data as listed    Component Value Date/Time   NA 131* 06/01/2015 0905   NA 142 05/15/2015 1156   NA 139 12/19/2011 1044   K 5.3* 06/01/2015 0905   K 4.1 12/19/2011 1044   CL 99* 06/01/2015 0905   CL 106 12/19/2011 1044   CO2 25 06/01/2015 0905   CO2 26 12/19/2011 1044   GLUCOSE 139* 06/01/2015 0905   GLUCOSE 152* 05/15/2015 1156   GLUCOSE 95 12/19/2011 1044   BUN 27* 06/01/2015 0905   BUN 16 05/15/2015 1156   BUN 18 12/19/2011 1044   CREATININE 1.07  06/01/2015 0905   CREATININE 1.06 12/19/2011 1044   CALCIUM 9.2 06/01/2015 0905   CALCIUM 9.0 12/19/2011 1044   PROT 7.0 05/18/2015 0840   PROT 6.1 05/15/2015 1156   PROT 9.0* 12/19/2011 1044   ALBUMIN 3.5 05/18/2015 0840   ALBUMIN 3.7 05/15/2015 1156   ALBUMIN 4.4 12/19/2011 1044   AST 27 05/18/2015 0840   AST 44* 12/19/2011 1044   ALT 22 05/18/2015 0840   ALT 46 12/19/2011 1044   ALKPHOS 94 05/18/2015 0840   ALKPHOS 110 12/19/2011 1044   BILITOT 0.2* 05/18/2015 0840   BILITOT 0.2 05/15/2015 1156   BILITOT 0.7 12/19/2011 1044   GFRNONAA >60 06/01/2015 0905   GFRNONAA >60 12/19/2011 1044   GFRAA >60 06/01/2015 0905   GFRAA >60 12/19/2011  1044    No results found for: SPEP, UPEP  Lab Results  Component Value Date   WBC 12.0* 06/01/2015   NEUTROABS 10.5* 06/01/2015   HGB 11.5* 06/01/2015   HCT 34.4* 06/01/2015   MCV 88.3 06/01/2015   PLT 303 06/01/2015      Chemistry      Component Value Date/Time   NA 131* 06/01/2015 0905   NA 142 05/15/2015 1156   NA 139 12/19/2011 1044   K 5.3* 06/01/2015 0905   K 4.1 12/19/2011 1044   CL 99* 06/01/2015 0905   CL 106 12/19/2011 1044   CO2 25 06/01/2015 0905   CO2 26 12/19/2011 1044   BUN 27* 06/01/2015 0905   BUN 16 05/15/2015 1156   BUN 18 12/19/2011 1044   CREATININE 1.07 06/01/2015 0905   CREATININE 1.06 12/19/2011 1044      Component Value Date/Time   CALCIUM 9.2 06/01/2015 0905   CALCIUM 9.0 12/19/2011 1044   ALKPHOS 94 05/18/2015 0840   ALKPHOS 110 12/19/2011 1044   AST 27 05/18/2015 0840   AST 44* 12/19/2011 1044   ALT 22 05/18/2015 0840   ALT 46 12/19/2011 1044   BILITOT 0.2* 05/18/2015 0840   BILITOT 0.2 05/15/2015 1156   BILITOT 0.7 12/19/2011 1044       ASSESSMENT & PLAN:   # DLBCL of pancreatic head-  Stage IE.Resolution after 3 cycles on PET scan. Currently status post 5 cycles of R CHOP chemotherapy. Discontinue further chemo.  # Pneumonia/question pneumocystis on a clinical basis- patient is  on Bactrim double strength 3 pills thrice a day along with prednisone 40 mg.s/p bronch- awaiting on PCP stains/I spoke microbiology this morning. Taper predinisone to 20mg /day. Awaiting to see Dr.Fitzgerald on 31st.  # HTN- on metoporlol; off cozaar sec to hyperkalemia. Once his potassium normalizes patient go back on Cozaar.  # Hyperkalemia/ creatinine- 5.5/ 1.3-question spurious from Bactrim.    # Hx of Afib- will recommend start xarelto once acute issues resolve. Hx of prior minor strokes.  # s/p biliary stent- will hold extraction while  being treated for pneumonia.    # Follow-up in about 2 weeks for reevaluation labs.     Cammie Sickle, MD 06/01/2015 9:58 AM

## 2015-06-01 NOTE — Progress Notes (Signed)
Patient is concerned about Prednisone not being tapered.  Also questioning having his port flushed now that he is through with treatment.  Informed him that MD will discuss with him about tapering his prednisone and appointments will be scheduled for port flushes.

## 2015-06-01 NOTE — Patient Instructions (Addendum)
Continue taking the bactrim as scheduled.  Start taking the new dose of prednisone of 20 mg daily (1 tablet).

## 2015-06-11 LAB — CULTURE, FUNGUS WITHOUT SMEAR

## 2015-06-18 ENCOUNTER — Inpatient Hospital Stay: Payer: 59

## 2015-06-18 ENCOUNTER — Inpatient Hospital Stay (HOSPITAL_BASED_OUTPATIENT_CLINIC_OR_DEPARTMENT_OTHER): Payer: 59 | Admitting: Internal Medicine

## 2015-06-18 ENCOUNTER — Ambulatory Visit
Admission: RE | Admit: 2015-06-18 | Discharge: 2015-06-18 | Disposition: A | Discharge status: Home / Self Care | Payer: Managed Care, Other (non HMO) | Source: Ambulatory Visit | Attending: Internal Medicine | Admitting: Internal Medicine

## 2015-06-18 ENCOUNTER — Inpatient Hospital Stay: Payer: 59 | Attending: Internal Medicine

## 2015-06-18 VITALS — BP 134/80 | HR 70 | Temp 98.0°F | Wt 266.1 lb

## 2015-06-18 DIAGNOSIS — Z8673 Personal history of transient ischemic attack (TIA), and cerebral infarction without residual deficits: Secondary | ICD-10-CM | POA: Diagnosis not present

## 2015-06-18 DIAGNOSIS — M797 Fibromyalgia: Secondary | ICD-10-CM | POA: Insufficient documentation

## 2015-06-18 DIAGNOSIS — Z7901 Long term (current) use of anticoagulants: Secondary | ICD-10-CM | POA: Insufficient documentation

## 2015-06-18 DIAGNOSIS — I4891 Unspecified atrial fibrillation: Secondary | ICD-10-CM | POA: Diagnosis not present

## 2015-06-18 DIAGNOSIS — C8330 Diffuse large B-cell lymphoma, unspecified site: Secondary | ICD-10-CM | POA: Insufficient documentation

## 2015-06-18 DIAGNOSIS — K219 Gastro-esophageal reflux disease without esophagitis: Secondary | ICD-10-CM | POA: Diagnosis not present

## 2015-06-18 DIAGNOSIS — G4730 Sleep apnea, unspecified: Secondary | ICD-10-CM | POA: Diagnosis not present

## 2015-06-18 DIAGNOSIS — Z9221 Personal history of antineoplastic chemotherapy: Secondary | ICD-10-CM | POA: Diagnosis not present

## 2015-06-18 DIAGNOSIS — J189 Pneumonia, unspecified organism: Secondary | ICD-10-CM | POA: Insufficient documentation

## 2015-06-18 DIAGNOSIS — Z79899 Other long term (current) drug therapy: Secondary | ICD-10-CM

## 2015-06-18 DIAGNOSIS — R609 Edema, unspecified: Secondary | ICD-10-CM | POA: Insufficient documentation

## 2015-06-18 DIAGNOSIS — I10 Essential (primary) hypertension: Secondary | ICD-10-CM | POA: Insufficient documentation

## 2015-06-18 DIAGNOSIS — M7989 Other specified soft tissue disorders: Secondary | ICD-10-CM | POA: Diagnosis not present

## 2015-06-18 DIAGNOSIS — Z87891 Personal history of nicotine dependence: Secondary | ICD-10-CM | POA: Insufficient documentation

## 2015-06-18 LAB — CBC WITH DIFFERENTIAL/PLATELET
Basophils Absolute: 0.1 10*3/uL (ref 0–0.1)
Basophils Relative: 1 %
Eosinophils Absolute: 0.6 10*3/uL (ref 0–0.7)
Eosinophils Relative: 9 %
HCT: 35.9 % — ABNORMAL LOW (ref 40.0–52.0)
Hemoglobin: 12 g/dL — ABNORMAL LOW (ref 13.0–18.0)
Lymphocytes Relative: 16 %
Lymphs Abs: 1 10*3/uL (ref 1.0–3.6)
MCH: 29 pg (ref 26.0–34.0)
MCHC: 33.5 g/dL (ref 32.0–36.0)
MCV: 86.7 fL (ref 80.0–100.0)
Monocytes Absolute: 0.6 10*3/uL (ref 0.2–1.0)
Monocytes Relative: 10 %
Neutro Abs: 4.1 10*3/uL (ref 1.4–6.5)
Neutrophils Relative %: 64 %
Platelets: 387 10*3/uL (ref 150–440)
RBC: 4.14 MIL/uL — ABNORMAL LOW (ref 4.40–5.90)
RDW: 16.6 % — ABNORMAL HIGH (ref 11.5–14.5)
WBC: 6.3 10*3/uL (ref 3.8–10.6)

## 2015-06-18 LAB — COMPREHENSIVE METABOLIC PANEL
ALT: 24 U/L (ref 17–63)
AST: 24 U/L (ref 15–41)
Albumin: 3.6 g/dL (ref 3.5–5.0)
Alkaline Phosphatase: 97 U/L (ref 38–126)
Anion gap: 7 (ref 5–15)
BUN: 13 mg/dL (ref 6–20)
CO2: 28 mmol/L (ref 22–32)
Calcium: 8.9 mg/dL (ref 8.9–10.3)
Chloride: 103 mmol/L (ref 101–111)
Creatinine, Ser: 0.92 mg/dL (ref 0.61–1.24)
GFR calc Af Amer: >60 mL/min (ref >60)
GFR calc non Af Amer: >60 mL/min (ref >60)
Glucose, Bld: 110 mg/dL — ABNORMAL HIGH (ref 65–99)
Potassium: 3.9 mmol/L (ref 3.5–5.1)
Sodium: 138 mmol/L (ref 135–145)
Total Bilirubin: 0.8 mg/dL (ref 0.3–1.2)
Total Protein: 6.8 g/dL (ref 6.5–8.1)

## 2015-06-18 MED ORDER — SODIUM CHLORIDE 0.9% FLUSH
10.0000 mL | Freq: Once | INTRAVENOUS | Status: AC
  Administered 2015-06-18: 10 mL via INTRAVENOUS
  Filled 2015-06-18: qty 10

## 2015-06-18 MED ORDER — HEPARIN SOD (PORK) LOCK FLUSH 100 UNIT/ML IV SOLN
500.0000 [IU] | Freq: Once | INTRAVENOUS | Status: AC
  Administered 2015-06-18: 500 [IU] via INTRAVENOUS

## 2015-06-18 NOTE — Progress Notes (Signed)
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 A.fib- off xarelto [mild blood in urine]  INTERVAL HISTORY:  65 year old  Male patient with above history of diffuse large B-cell lymphoma stage IE peripancreatic region currently R CHOP  Status post 5 treatments; and also pneumocystis pneumonia on Bactrim is here for follow-up.  Patient states that he has gained about 20 pounds in the last few weeks [patient had been on prednisone for his PCP/along with Bactrim]. Recent evaluation with ID- he was recommended Bactrim once a for 2 months.   However patient noted swelling in the leg bilateral/erythema. Weight gain was attributed to prednisone- when stopped; however the redness in the lower extremity got worse. Patient stopped taking Bactrim-about 4 days ago; swelling in the legs improving/ also redness in the legs much improved. The redness in the legs and swelling have not completely resolved.  No nausea no vomiting. Appetite is good.Patient denies any tingling and numbness in the hand and feet. No cough or shortness of breath or chest pain.  REVIEW OF SYSTEMS:  A complete 10 point review of system is done which is negative except mentioned above/history of present illness.   PAST MEDICAL HISTORY :  Past Medical History  Diagnosis Date  . GERD (gastroesophageal reflux disease)   . Osteoarthrosis, unspecified whether generalized or localized, lower leg   . H/O Bell's palsy   . History of [place]  measles   . Atrial fibrillation ([place])   . Hypertension   . Mini stroke ([place])   . Sixth cranial nerve palsy   . Fibromyalgia   . Collagen vascular disease ([place])   . Cancer ([place])   . Stroke ([place])   . Sleep apnea     PAST SURGICAL HISTORY :   Past Surgical History  Procedure Laterality Date  . Cyst excision    . Trigger finger release    . Ercp N/A 12/29/2014    Procedure: ENDOSCOPIC RETROGRADE CHOLANGIOPANCREATOGRAPHY (ERCP);  Surgeon: Hulen Luster, MD;  Location: [facility] ENDOSCOPY;  Service: Endoscopy;  Laterality: N/A;  . Peripheral vascular catheterization N/A 01/31/2015    Procedure: Porta Cath Insertion;  Surgeon: Katha Cabal, MD;  Location: [place] [facility] LAB;  Service: Cardiovascular;  Laterality: N/A;  . Ercp N/A 02/21/2015    Procedure: ENDOSCOPIC RETROGRADE CHOLANGIOPANCREATOGRAPHY (ERCP);  Surgeon: Hulen Luster, MD;  Location: [facility] ENDOSCOPY;  Service: Gastroenterology;  Laterality: N/A;    FAMILY HISTORY :   Family History  Problem Relation Age of Onset  . Hypertension Mother     SOCIAL HISTORY:   Social History  Substance Use Topics  . Smoking status: Former Smoker -- 4.50 packs/day for 15 years    Types: Cigarettes  . Smokeless tobacco: Not on file  . Alcohol Use: No    ALLERGIES:  is allergic to celebrex and penicillins.  MEDICATIONS:  Current Outpatient Prescriptions  Medication Sig Dispense Refill  . acetaminophen (TYLENOL) 500 MG tablet Take 1,000 mg by mouth every 6 (six) hours as  needed for mild pain, fever or headache.     . b complex vitamins capsule Take 1 capsule by mouth at bedtime.    Marland Kitchen diltiazem (TIAZAC) 360 MG 24 hr capsule Take 360 mg by mouth daily.    . Ipratropium-Albuterol (COMBIVENT RESPIMAT) 20-100 MCG/ACT AERS respimat Inhale 1 puff into the lungs every 6 (six) hours as needed for wheezing or shortness of breath.    . lidocaine-prilocaine (EMLA) cream Apply 1 application topically as needed (prior to accessing port).    . loratadine  (CLARITIN) 10 MG tablet Take 10 mg by mouth daily as needed for allergies.     . magnesium oxide (MAG-OX) 400 (241.3 Mg) MG tablet Take 400 mg by mouth at bedtime.     . metoprolol tartrate (LOPRESSOR) 25 MG tablet Take 1 tablet (25 mg total) by mouth 2 (two) times daily. 60 tablet 3  . nystatin (MYCOSTATIN) 100000 UNIT/ML suspension Take 5 mLs by mouth 4 (four) times daily as needed (for thrush).    Marland Kitchen omeprazole (PRILOSEC) 20 MG capsule Take 20 mg by mouth at bedtime.    . ondansetron (ZOFRAN) 8 MG tablet Take 8 mg by mouth every 8 (eight) hours as needed for nausea or vomiting. Reported on 05/21/2015    . predniSONE (DELTASONE) 20 MG tablet 2 pills once a day with food 40 tablet 0  . sulfamethoxazole-trimethoprim (BACTRIM DS,SEPTRA DS) 800-160 MG tablet 4 pills every 8 hours 240 tablet 0   No current facility-administered medications for this visit.    PHYSICAL EXAMINATION: ECOG PERFORMANCE STATUS: 0 - Asymptomatic  There were no vitals taken for this visit.  There were no vitals filed for this visit.  GENERAL: Well-nourished well-developed; Alert, no distress and comfortable. He is alone. EYES: no pallor or icterus OROPHARYNX: no thrush or ulceration; good dentition  NECK: supple, no masses felt LYMPH:  no palpable lymphadenopathy in the cervical, axillary or inguinal regions LUNGS: decreased breath sounds to auscultation and  No wheeze or crackles HEART/CVS: regular rate & rhythm and no murmurs; No lower extremity edema ABDOMEN:abdomen soft, non-tender and normal bowel sounds Musculoskeletal:no cyanosis of digits and no clubbing  PSYCH: alert & oriented x 3 with fluent speech NEURO: no focal motor/sensory deficits SKIN:  no rashes or significant lesions; mediport accessed.   LABORATORY DATA:  I have reviewed the data as listed    Component Value Date/Time   NA 131* 06/01/2015 0905   NA 142 05/15/2015 1156   NA 139 12/19/2011 1044   K 5.3* 06/01/2015 0905   K 4.1 12/19/2011  1044   CL 99* 06/01/2015 0905   CL 106 12/19/2011 1044   CO2 25 06/01/2015 0905   CO2 26 12/19/2011 1044   GLUCOSE 139* 06/01/2015 0905   GLUCOSE 152* 05/15/2015 1156   GLUCOSE 95 12/19/2011 1044   BUN 27* 06/01/2015 0905   BUN 16 05/15/2015 1156   BUN 18 12/19/2011 1044   CREATININE 1.07 06/01/2015 0905   CREATININE 1.06 12/19/2011 1044   CALCIUM 9.2 06/01/2015 0905   CALCIUM 9.0 12/19/2011 1044   PROT 7.0 05/18/2015 0840   PROT 6.1 05/15/2015 1156   PROT 9.0* 12/19/2011 1044   ALBUMIN 3.5 05/18/2015 0840   ALBUMIN 3.7 05/15/2015 1156   ALBUMIN 4.4 12/19/2011 1044   AST 27 05/18/2015 0840   AST 44* 12/19/2011 1044   ALT 22 05/18/2015 0840   ALT 46 12/19/2011 1044   ALKPHOS 94 05/18/2015 0840   ALKPHOS 110 12/19/2011 1044  BILITOT 0.2* 05/18/2015 0840   BILITOT 0.2 05/15/2015 1156   BILITOT 0.7 12/19/2011 1044   GFRNONAA >60 06/01/2015 0905   GFRNONAA >60 12/19/2011 1044   GFRAA >60 06/01/2015 0905   GFRAA >60 12/19/2011 1044    No results found for: SPEP, UPEP  Lab Results  Component Value Date   WBC 12.0* 06/01/2015   NEUTROABS 10.5* 06/01/2015   HGB 11.5* 06/01/2015   HCT 34.4* 06/01/2015   MCV 88.3 06/01/2015   PLT 303 06/01/2015      Chemistry      Component Value Date/Time   NA 131* 06/01/2015 0905   NA 142 05/15/2015 1156   NA 139 12/19/2011 1044   K 5.3* 06/01/2015 0905   K 4.1 12/19/2011 1044   CL 99* 06/01/2015 0905   CL 106 12/19/2011 1044   CO2 25 06/01/2015 0905   CO2 26 12/19/2011 1044   BUN 27* 06/01/2015 0905   BUN 16 05/15/2015 1156   BUN 18 12/19/2011 1044   CREATININE 1.07 06/01/2015 0905   CREATININE 1.06 12/19/2011 1044      Component Value Date/Time   CALCIUM 9.2 06/01/2015 0905   CALCIUM 9.0 12/19/2011 1044   ALKPHOS 94 05/18/2015 0840   ALKPHOS 110 12/19/2011 1044   AST 27 05/18/2015 0840   AST 44* 12/19/2011 1044   ALT 22 05/18/2015 0840   ALT 46 12/19/2011 1044   BILITOT 0.2* 05/18/2015 0840   BILITOT 0.2  05/15/2015 1156   BILITOT 0.7 12/19/2011 1044       ASSESSMENT & PLAN:   # DLBCL of pancreatic head-  Stage IE.Resolution after 3 cycles on PET scan. Currently status post 5 cycles of R CHOP chemotherapy. Discontinue further chemo.  # Pneumonia/question pneumocystis on a clinical basis/; Status post bronchoscopy. Status post evaluation with ID- Bactrim once a day. Currently on hold; see discussion below  # Bilateral lower extremity swelling/erythema- likely from Bactrim; rather than steroids. Does not look clinically cellulitis. Rule out DVT check Dopplers Recommend holding the Bactrim; await recommendations from ID.   # Hx of Afib- will recommend start xarelto once acute issues resolve. Hx of prior minor strokes.  # s/p biliary stent- will hold extraction while  being treated for pneumonia.    # Follow-up in about 2 weeks for reevaluation labs; I paged Dr. Ola Spurr; await recommendations. Discussed with pt.     Cammie Sickle, MD 06/18/2015 1:44 PM

## 2015-06-18 NOTE — Progress Notes (Signed)
Patient states he has finished his prednisone and stopped taking his bactrim on Thursday after noting he was swelling.  Today his lower extremities are edematous and red up to mid calves.  Patient states he was red up to his knees.  He states he feels the fluid in his chest, legs and feet.  States his feet and legs are tender. Hard to bend his legs.  Patient states he is allergic to celebrex and bactrim contains an ingredient that is in celebrex, therefore, he believes all this is related to bactrim.  Patient is not having SOB.

## 2015-06-19 ENCOUNTER — Telehealth: Payer: Self-pay | Admitting: *Deleted

## 2015-06-19 ENCOUNTER — Ambulatory Visit: Admission: RE | Admit: 2015-06-19 | Payer: 59 | Source: Ambulatory Visit

## 2015-06-19 NOTE — Telephone Encounter (Signed)
Dr. B spoke with Dr. Ola Spurr and Dr. Ola Spurr will contact pt to set up follow up. Left message with patient. Instructed pt to callback if has further questions or if does not hear from Dr. Blane Ohara office this week.

## 2015-06-22 ENCOUNTER — Other Ambulatory Visit: Payer: Self-pay

## 2015-06-22 ENCOUNTER — Ambulatory Visit: Payer: Self-pay | Admitting: Internal Medicine

## 2015-06-26 ENCOUNTER — Other Ambulatory Visit
Admission: RE | Admit: 2015-06-26 | Discharge: 2015-06-26 | Disposition: A | Discharge status: Home / Self Care | Payer: 59 | Source: Ambulatory Visit | Attending: Infectious Diseases | Admitting: Infectious Diseases

## 2015-06-26 ENCOUNTER — Other Ambulatory Visit: Payer: Self-pay | Admitting: Internal Medicine

## 2015-06-26 DIAGNOSIS — B59 Pneumocystosis: Secondary | ICD-10-CM | POA: Diagnosis present

## 2015-06-26 DIAGNOSIS — C8333 Diffuse large B-cell lymphoma, intra-abdominal lymph nodes: Secondary | ICD-10-CM | POA: Diagnosis present

## 2015-06-26 DIAGNOSIS — I480 Paroxysmal atrial fibrillation: Secondary | ICD-10-CM | POA: Diagnosis present

## 2015-06-26 LAB — BRAIN NATRIURETIC PEPTIDE: B Natriuretic Peptide: 30 pg/mL (ref 0.0–100.0)

## 2015-06-29 ENCOUNTER — Ambulatory Visit (INDEPENDENT_AMBULATORY_CARE_PROVIDER_SITE_OTHER): Payer: 59 | Admitting: Cardiovascular Disease

## 2015-06-29 ENCOUNTER — Encounter: Payer: Self-pay | Admitting: Cardiovascular Disease

## 2015-06-29 VITALS — BP 122/72 | HR 64 | Ht 67.0 in | Wt 260.8 lb

## 2015-06-29 DIAGNOSIS — I480 Paroxysmal atrial fibrillation: Secondary | ICD-10-CM

## 2015-06-29 DIAGNOSIS — R600 Localized edema: Secondary | ICD-10-CM

## 2015-06-29 DIAGNOSIS — R0600 Dyspnea, unspecified: Secondary | ICD-10-CM

## 2015-06-29 MED ORDER — RIVAROXABAN 20 MG PO TABS: 20.0000 mg | ORAL_TABLET | Freq: Every day | ORAL | Status: DC

## 2015-06-29 NOTE — Patient Instructions (Addendum)
Medication Instructions:  Your physician has recommended you make the following change in your medication:  START taking xarelto 20mg  once daily   Labwork: none  Testing/Procedures: Your physician has requested that you have an echocardiogram. Echocardiography is a painless test that uses sound waves to create images of your heart. It provides your doctor with information about the size and shape of your heart and how well your heart's chambers and valves are working. This procedure takes approximately one hour. There are no restrictions for this procedure.   Follow-Up: Keep your August appointment with Dr. Fletcher Anon.    Any Other Special Instructions Will Be Listed Below (If Applicable).     If you need a refill on your cardiac medications before your next appointment, please call your pharmacy.  Echocardiogram An echocardiogram, or echocardiography, uses sound waves (ultrasound) to produce an image of your heart. The echocardiogram is simple, painless, obtained within a short period of time, and offers valuable information to your health care provider. The images from an echocardiogram can provide information such as:  Evidence of coronary artery disease (CAD).  Heart size.  Heart muscle function.  Heart valve function.  Aneurysm detection.  Evidence of a past heart attack.  Fluid buildup around the heart.  Heart muscle thickening.  Assess heart valve function. LET [facility] CARE PROVIDER KNOW ABOUT:  Any allergies you have.  All medicines you are taking, including vitamins, herbs, eye drops, creams, and over-the-counter medicines.  Previous problems you or members of your family have had with the use of anesthetics.  Any blood disorders you have.  Previous surgeries you have had.  Medical conditions you have.  Possibility of pregnancy, if this applies. BEFORE THE PROCEDURE  No special preparation is needed. Eat and drink normally.  PROCEDURE   In order  to produce an image of your heart, gel will be applied to your chest and a wand-like tool (transducer) will be moved over your chest. The gel will help transmit the sound waves from the transducer. The sound waves will harmlessly bounce off your heart to allow the heart images to be captured in real-time motion. These images will then be recorded.  You may need an IV to receive a medicine that improves the quality of the pictures. AFTER THE PROCEDURE You may return to your normal schedule including diet, activities, and medicines, unless your health care provider tells you otherwise.   This information is not intended to replace advice given to you by your health care provider. Make sure you discuss any questions you have with your health care provider.   Document Released: 12/21/1999 Document Revised: 01/13/2014 Document Reviewed: 08/30/2012 Elsevier Interactive Patient Education [employer].

## 2015-06-29 NOTE — Progress Notes (Signed)
Cardiology Office Note   Date:  06/29/2015   ID:  Mason Houston, DOB [birth year], MRN [id number]  PCP:  Keith Rake, MD  Cardiologist:   Kathlyn Sacramento, MD   Chief Complaint  Patient presents with  . other    Pt. c/o swelling all over his body, cramping/pain in calf, had a reaction to Bactrim. Meds reviewed by the patient verbally.       History of Present Illness: Mason Houston is a 65 y.o. male who presents for a followup visit regarding paroxysmal atrial fibrillation.  The patient has a long history of well-controlled hypertension and obesity. He was diagnosed with atrial fibrillation with rapid ventricular response during a routine physical  in December of 2013 in the setting of heavy caffeine intake. He also has sleep apnea currently effectively treated with CPAP.  He had a stroke in August 2015.  Since last visit, he was diagnosed with non-Hodgkin's lymphoma and was started on chemotherapy with subsequent improvement.  Anticoagulation was held due to hematuria. He was hospitalized recently for pneumonia And initially treated with Levaquin. His symptoms continued and ultimately he was treated with Bactrim and prednisone for suspected PCP pneumonia. He then developed severe leg edema and generalized swelling with lower extremity rash and 20 pounds weight gain in spite of no change in his diet. He was suspected of having an allergic reaction to Bactrim with fluid retention worsened by prednisone. BNP was normal. Lower extremity venous duplex was negative for DVT. He was placed on small dose furosemide and was asked to follow-up with [place] for an echocardiogram. He lost 5 pounds since he was started on furosemide. He feels better and the edema is less. No chest pain, shortness of breath or palpitations.    Past Medical History  Diagnosis Date  . GERD (gastroesophageal reflux disease)   . Osteoarthrosis, unspecified whether generalized or localized, lower leg   . H/O Bell's palsy   .  History of [place] measles   . Atrial fibrillation ([place])   . Hypertension   . Mini stroke ([place])   . Sixth cranial nerve palsy   . Fibromyalgia   . Collagen vascular disease ([place])   . Cancer ([place])   . Stroke ([place])   . Sleep apnea     Past Surgical History  Procedure Laterality Date  . Cyst excision    . Trigger finger release    . Ercp N/A 12/29/2014    Procedure: ENDOSCOPIC RETROGRADE CHOLANGIOPANCREATOGRAPHY (ERCP);  Surgeon: Hulen Luster, MD;  Location: [facility] ENDOSCOPY;  Service: Endoscopy;  Laterality: N/A;  . Peripheral vascular catheterization N/A 01/31/2015    Procedure: Porta Cath Insertion;  Surgeon: Katha Cabal, MD;  Location: [place] CV LAB;  Service: Cardiovascular;  Laterality: N/A;  . Ercp N/A 02/21/2015    Procedure: ENDOSCOPIC RETROGRADE CHOLANGIOPANCREATOGRAPHY (ERCP);  Surgeon: Hulen Luster, MD;  Location: [facility] ENDOSCOPY;  Service: Gastroenterology;  Laterality: N/A;     Current Outpatient Prescriptions  Medication Sig Dispense Refill  . acetaminophen (TYLENOL) 500 MG tablet Take 1,000 mg by mouth every 6 (six) hours as needed for mild pain, fever or headache.     . b complex vitamins capsule Take 1 capsule by mouth at bedtime.    Marland Kitchen diltiazem (TIAZAC) 360 MG 24 hr capsule Take 360 mg by mouth daily.    . furosemide (LASIX) 20 MG tablet Take 20 mg by mouth daily.     Marland Kitchen lidocaine-prilocaine (EMLA) cream Apply 1 application topically as needed (  prior to accessing port). Reported on 06/29/2015    . loratadine (CLARITIN) 10 MG tablet Take 10 mg by mouth daily as needed for allergies.     . magnesium oxide (MAG-OX) 400 (241.3 Mg) MG tablet Take 400 mg by mouth at bedtime.     . metoprolol tartrate (LOPRESSOR) 25 MG tablet Take 1 tablet (25 mg total) by mouth 2 (two) times daily. 60 tablet 3  . nystatin (MYCOSTATIN) 100000 UNIT/ML suspension Take 5 mLs by mouth 4 (four) times daily as needed (for thrush).    Marland Kitchen omeprazole (PRILOSEC) 20 MG capsule Take 20 mg by mouth at  bedtime.    . rivaroxaban (XARELTO) 20 MG TABS tablet Take 1 tablet (20 mg total) by mouth daily with supper. 30 tablet 3   No current facility-administered medications for this visit.    Allergies:   Celebrex and Penicillins    Social History:  The patient  reports that he has quit smoking. His smoking use included Cigarettes. He has a 67.5 pack-year smoking history. He does not have any smokeless tobacco history on file. He reports that he uses illicit drugs (Marijuana). He reports that he does not drink alcohol.   Family History:  The patient's family history includes Hypertension in his mother.    ROS:  Please see the history of present illness.   Otherwise, review of systems are positive for none.   All other systems are reviewed and negative.    PHYSICAL EXAM: VS:  BP 122/72 mmHg  Pulse 64  Ht 5\' 7"  (1.702 m)  Wt 260 lb 12 oz (118.275 kg)  BMI 40.83 kg/m2 , BMI Body mass index is 40.83 kg/(m^2). GEN: Well nourished, well developed, in no acute distress HEENT: normal Neck: no JVD, carotid bruits, or masses Cardiac: RRR; no murmurs, rubs, or gallops, Mild-to-moderate leg edema  Respiratory:  clear to auscultation bilaterally, normal work of breathing GI: soft, nontender, nondistended, + BS MS: no deformity or atrophy Skin: warm and dry, no rash Neuro:  Strength and sensation are intact Psych: euthymic mood, full affect   EKG:  EKG is ordered today. The ekg ordered today demonstrates normal sinus rhythm with no significant ST or T wave changes. Heart rate is 64  bpm.   Recent Labs: 06/18/2015: ALT 24; BUN 13; Creatinine, Ser 0.92; Hemoglobin 12.0*; Platelets 387; Potassium 3.9; Sodium 138 06/26/2015: B Natriuretic Peptide 30.0    Lipid Panel No results found for: CHOL, TRIG, HDL, CHOLHDL, VLDL, LDLCALC, LDLDIRECT    Wt Readings from Last 3 Encounters:  06/29/15 260 lb 12 oz (118.275 kg)  06/18/15 266 lb 1.5 oz (120.7 kg)  06/01/15 253 lb 8.5 oz (115 kg)         ASSESSMENT AND PLAN:  1. Generalized edema and weight gain: Possible reaction to Bactrim and volume retention in the setting of prednisone treatment. No clear signs of congestive heart failure. BNP was normal. Continue small dose furosemide and I asked him to elevate his legs at least 3 times during the day. Given his edema and worsening dyspnea, I requested an echocardiogram.  2.  Paroxysmal atrial fibrillation: Currently maintaining in sinus rhythm. Continue diltiazem and metoprolol. His hemoglobin has been stable with no further bleeding issues. No further chemotherapy is planned and thus I think it's reasonable to resume anticoagulation with Xarelto. He is going to start this medication tomorrow.   2. Essential hypertension: Blood pressure is controlled without losartan.    Disposition:   FU with me in  2 months  Signed,  Kathlyn Sacramento, MD  06/29/2015 10:20 AM    [place]

## 2015-07-02 ENCOUNTER — Inpatient Hospital Stay (HOSPITAL_BASED_OUTPATIENT_CLINIC_OR_DEPARTMENT_OTHER): Payer: 59 | Admitting: Internal Medicine

## 2015-07-02 ENCOUNTER — Inpatient Hospital Stay: Payer: 59

## 2015-07-02 VITALS — BP 147/84 | HR 73 | Temp 98.0°F | Resp 18 | Wt 261.9 lb

## 2015-07-02 DIAGNOSIS — R609 Edema, unspecified: Secondary | ICD-10-CM

## 2015-07-02 DIAGNOSIS — J189 Pneumonia, unspecified organism: Secondary | ICD-10-CM

## 2015-07-02 DIAGNOSIS — Z9221 Personal history of antineoplastic chemotherapy: Secondary | ICD-10-CM

## 2015-07-02 DIAGNOSIS — Z79899 Other long term (current) drug therapy: Secondary | ICD-10-CM

## 2015-07-02 DIAGNOSIS — M7989 Other specified soft tissue disorders: Secondary | ICD-10-CM

## 2015-07-02 DIAGNOSIS — C8330 Diffuse large B-cell lymphoma, unspecified site: Secondary | ICD-10-CM

## 2015-07-02 LAB — CBC WITH DIFFERENTIAL/PLATELET
Basophils Absolute: 0.1 K/uL (ref 0–0.1)
Basophils Relative: 1 %
Eosinophils Absolute: 0.3 K/uL (ref 0–0.7)
Eosinophils Relative: 4 %
HCT: 38.6 % — ABNORMAL LOW (ref 40.0–52.0)
Hemoglobin: 12.9 g/dL — ABNORMAL LOW (ref 13.0–18.0)
Lymphocytes Relative: 24 %
Lymphs Abs: 1.7 K/uL (ref 1.0–3.6)
MCH: 28.4 pg (ref 26.0–34.0)
MCHC: 33.5 g/dL (ref 32.0–36.0)
MCV: 84.6 fL (ref 80.0–100.0)
Monocytes Absolute: 1 K/uL (ref 0.2–1.0)
Monocytes Relative: 14 %
Neutro Abs: 4.1 K/uL (ref 1.4–6.5)
Neutrophils Relative %: 57 %
Platelets: 404 K/uL (ref 150–440)
RBC: 4.57 MIL/uL (ref 4.40–5.90)
RDW: 16.1 % — ABNORMAL HIGH (ref 11.5–14.5)
WBC: 7.2 K/uL (ref 3.8–10.6)

## 2015-07-02 LAB — BASIC METABOLIC PANEL
Anion gap: 8 (ref 5–15)
BUN: 19 mg/dL (ref 6–20)
CO2: 27 mmol/L (ref 22–32)
Calcium: 8.9 mg/dL (ref 8.9–10.3)
Chloride: 101 mmol/L (ref 101–111)
Creatinine, Ser: 0.92 mg/dL (ref 0.61–1.24)
GFR calc Af Amer: >60 mL/min (ref >60)
GFR calc non Af Amer: >60 mL/min (ref >60)
Glucose, Bld: 120 mg/dL — ABNORMAL HIGH (ref 65–99)
Potassium: 3.9 mmol/L (ref 3.5–5.1)
Sodium: 136 mmol/L (ref 135–145)

## 2015-07-02 NOTE — Progress Notes (Signed)
Patient states Dr. Ola Spurr gave him a five day supply of Lasix that expired on 07-01-15. States he has lost about 6# since starting the Lasix and wants to know if MD wants him to continue with Lasix.  If so will need new prescription.  Patient also started back on Xarelto.

## 2015-07-02 NOTE — Progress Notes (Signed)
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  Bactrim was discontinued because of fluid retention and swelling in the legs. He was started on Lasix; leg swelling significantly improved. However continues to have dependent edema. He has lost weight attributes to Lasix. He is currently off Lasix. In the interim he has evaluated by cardiology awaiting a 2-D echo.   No nausea no vomiting. Appetite is good.Patient denies any tingling and numbness in the hand and feet. No cough or shortness of breath or chest pain. no cough. No shortness of breath with exertion.   REVIEW OF SYSTEMS:  A complete 10 point review of system is done which is negative except mentioned above/history of present illness.   PAST MEDICAL HISTORY :  Past Medical History  Diagnosis Date  . GERD (gastroesophageal reflux disease)   . Osteoarthrosis, unspecified whether generalized or localized, lower leg   . H/O Bell's palsy   . History of [place] measles   . Atrial fibrillation ([place])   . Hypertension   . Mini stroke  ([place])   . Sixth cranial nerve palsy   . Fibromyalgia   . Collagen vascular disease ([place])   . Cancer ([place])   . Stroke ([place])   . Sleep apnea     PAST SURGICAL HISTORY :   Past Surgical History  Procedure Laterality Date  . Cyst excision    . Trigger finger release    . Ercp N/A 12/29/2014    Procedure: ENDOSCOPIC RETROGRADE CHOLANGIOPANCREATOGRAPHY (ERCP);  Surgeon: Hulen Luster, MD;  Location: [facility] ENDOSCOPY;  Service: Endoscopy;  Laterality: N/A;  . Peripheral vascular catheterization N/A 01/31/2015    Procedure: Porta Cath Insertion;  Surgeon: Katha Cabal, MD;  Location: [place] CV LAB;  Service: Cardiovascular;  Laterality: N/A;  . Ercp N/A 02/21/2015    Procedure: ENDOSCOPIC RETROGRADE CHOLANGIOPANCREATOGRAPHY (ERCP);  Surgeon: Hulen Luster, MD;  Location: [facility] ENDOSCOPY;  Service: Gastroenterology;  Laterality: N/A;    FAMILY HISTORY :   Family History  Problem Relation Age of Onset  . Hypertension Mother     SOCIAL HISTORY:   Social History  Substance Use Topics  . Smoking status: Former Smoker -- 4.50 packs/day for 15 years    Types: Cigarettes  . Smokeless tobacco: Not on file  . Alcohol Use: No    ALLERGIES:  is allergic to celebrex and penicillins.  MEDICATIONS:  Current Outpatient Prescriptions  Medication Sig Dispense Refill  . acetaminophen (TYLENOL) 500 MG tablet Take 1,000 mg by mouth every 6 (six) hours as needed for mild pain, fever or headache.     Marland Kitchen  b complex vitamins capsule Take 1 capsule by mouth at bedtime.    Marland Kitchen diltiazem (TIAZAC) 360 MG 24 hr capsule Take 360 mg by mouth daily.    Marland Kitchen lidocaine-prilocaine (EMLA) cream Apply 1 application topically as needed (prior to accessing port). Reported on 06/29/2015    . loratadine (CLARITIN) 10 MG tablet Take 10 mg by mouth daily as needed for allergies.     . magnesium oxide (MAG-OX) 400 (241.3 Mg) MG tablet Take 400 mg by mouth at bedtime.     . metoprolol tartrate (LOPRESSOR) 25 MG tablet Take 1  tablet (25 mg total) by mouth 2 (two) times daily. 60 tablet 3  . nystatin (MYCOSTATIN) 100000 UNIT/ML suspension Take 5 mLs by mouth 4 (four) times daily as needed (for thrush).    Marland Kitchen omeprazole (PRILOSEC) 20 MG capsule Take 20 mg by mouth at bedtime.    . rivaroxaban (XARELTO) 20 MG TABS tablet Take 1 tablet (20 mg total) by mouth daily with supper. 30 tablet 3  . furosemide (LASIX) 20 MG tablet Take 20 mg by mouth daily.      No current facility-administered medications for this visit.    PHYSICAL EXAMINATION: ECOG PERFORMANCE STATUS: 0 - Asymptomatic  BP 147/84 mmHg  Pulse 73  Temp(Src) 98 F (36.7 C) (Tympanic)  Resp 18  Wt 261 lb 14.5 oz (118.8 kg)  Filed Weights   07/02/15 1429  Weight: 261 lb 14.5 oz (118.8 kg)    GENERAL: Well-nourished well-developed; Alert, no distress and comfortable. He is alone. EYES: no pallor or icterus OROPHARYNX: no thrush or ulceration; good dentition  NECK: supple, no masses felt LYMPH:  no palpable lymphadenopathy in the cervical, axillary or inguinal regions LUNGS: decreased breath sounds to auscultation and  No wheeze or crackles HEART/CVS: regular rate & rhythm and no murmurs; No lower extremity edema ABDOMEN:abdomen soft, non-tender and normal bowel sounds Musculoskeletal:no cyanosis of digits and no clubbing  PSYCH: alert & oriented x 3 with fluent speech NEURO: no focal motor/sensory deficits SKIN:  no rashes or significant lesions; mediport accessed.   LABORATORY DATA:  I have reviewed the data as listed    Component Value Date/Time   NA 136 07/02/2015 1406   NA 142 05/15/2015 1156   NA 139 12/19/2011 1044   K 3.9 07/02/2015 1406   K 4.1 12/19/2011 1044   CL 101 07/02/2015 1406   CL 106 12/19/2011 1044   CO2 27 07/02/2015 1406   CO2 26 12/19/2011 1044   GLUCOSE 120* 07/02/2015 1406   GLUCOSE 152* 05/15/2015 1156   GLUCOSE 95 12/19/2011 1044   BUN 19 07/02/2015 1406   BUN 16 05/15/2015 1156   BUN 18 12/19/2011 1044    CREATININE 0.92 07/02/2015 1406   CREATININE 1.06 12/19/2011 1044   CALCIUM 8.9 07/02/2015 1406   CALCIUM 9.0 12/19/2011 1044   PROT 6.8 06/18/2015 1323   PROT 6.1 05/15/2015 1156   PROT 9.0* 12/19/2011 1044   ALBUMIN 3.6 06/18/2015 1323   ALBUMIN 3.7 05/15/2015 1156   ALBUMIN 4.4 12/19/2011 1044   AST 24 06/18/2015 1323   AST 44* 12/19/2011 1044   ALT 24 06/18/2015 1323   ALT 46 12/19/2011 1044   ALKPHOS 97 06/18/2015 1323   ALKPHOS 110 12/19/2011 1044   BILITOT 0.8 06/18/2015 1323   BILITOT 0.2 05/15/2015 1156   BILITOT 0.7 12/19/2011 1044   GFRNONAA >60 07/02/2015 1406   GFRNONAA >60 12/19/2011 1044   GFRAA >60 07/02/2015 1406   GFRAA >  60 12/19/2011 1044    No results found for: SPEP, UPEP  Lab Results  Component Value Date   WBC 7.2 07/02/2015   NEUTROABS 4.1 07/02/2015   HGB 12.9* 07/02/2015   HCT 38.6* 07/02/2015   MCV 84.6 07/02/2015   PLT 404 07/02/2015      Chemistry      Component Value Date/Time   NA 136 07/02/2015 1406   NA 142 05/15/2015 1156   NA 139 12/19/2011 1044   K 3.9 07/02/2015 1406   K 4.1 12/19/2011 1044   CL 101 07/02/2015 1406   CL 106 12/19/2011 1044   CO2 27 07/02/2015 1406   CO2 26 12/19/2011 1044   BUN 19 07/02/2015 1406   BUN 16 05/15/2015 1156   BUN 18 12/19/2011 1044   CREATININE 0.92 07/02/2015 1406   CREATININE 1.06 12/19/2011 1044      Component Value Date/Time   CALCIUM 8.9 07/02/2015 1406   CALCIUM 9.0 12/19/2011 1044   ALKPHOS 97 06/18/2015 1323   ALKPHOS 110 12/19/2011 1044   AST 24 06/18/2015 1323   AST 44* 12/19/2011 1044   ALT 24 06/18/2015 1323   ALT 46 12/19/2011 1044   BILITOT 0.8 06/18/2015 1323   BILITOT 0.2 05/15/2015 1156   BILITOT 0.7 12/19/2011 1044       ASSESSMENT & PLAN:   DLBCL (diffuse large B cell lymphoma) (HCC) # DLBCL of pancreatic head-  Stage IE.Resolution after 3 cycles on PET scan. Currently status post 5 cycles of R CHOP chemotherapy. Discontinue further chemo. Repeat PET in 4  weeks.   # Pneumonia/question pneumocystis on a clinical basis/; Status post bronchoscopy. S/p Bactrim; per ID. Currently off sec to rash/fluid retention.   # Bilateral lower extremity swelling/erythema- likely from Bactrim/steroids- improving. Awaiting 2-D echo from cardiology.  # Hx of A.fib on xarelto as per cardiology.   # s/p biliary stent- await for PET reeval- and then stent extraction.   # Mediport- continue port flushes; if PET scan negative then we can have it explanted.  # Follow-up with me with CBC CMP in 5 weeks.       Cammie Sickle, MD 07/02/2015 4:46 PM

## 2015-07-02 NOTE — Assessment & Plan Note (Addendum)
#   DLBCL of pancreatic head-  Stage IE.Resolution after 3 cycles on PET scan. Currently status post 5 cycles of R CHOP chemotherapy. Discontinue further chemo. Repeat PET in 4 weeks.   # Pneumonia/question pneumocystis on a clinical basis/; Status post bronchoscopy. S/p Bactrim; per ID. Currently off sec to rash/fluid retention.   # Bilateral lower extremity swelling/erythema- likely from Bactrim/steroids- improving. Awaiting 2-D echo from cardiology.  # Hx of A.fib on xarelto as per cardiology.   # s/p biliary stent- await for PET reeval- and then stent extraction.   # Mediport- continue port flushes; if PET scan negative then we can have it explanted.  # Follow-up with me with CBC CMP in 5 weeks.

## 2015-07-03 ENCOUNTER — Encounter: Payer: Self-pay | Admitting: Family Medicine

## 2015-07-03 ENCOUNTER — Ambulatory Visit (INDEPENDENT_AMBULATORY_CARE_PROVIDER_SITE_OTHER): Payer: 59 | Admitting: Family Medicine

## 2015-07-03 VITALS — BP 110/72 | HR 75 | Temp 98.8°F | Resp 18 | Ht 67.0 in | Wt 262.0 lb

## 2015-07-03 DIAGNOSIS — Z0000 Encounter for general adult medical examination without abnormal findings: Secondary | ICD-10-CM | POA: Diagnosis not present

## 2015-07-03 DIAGNOSIS — M7989 Other specified soft tissue disorders: Secondary | ICD-10-CM | POA: Diagnosis not present

## 2015-07-03 LAB — POCT GLYCOSYLATED HEMOGLOBIN (HGB A1C): Hemoglobin A1C: 6.2

## 2015-07-03 NOTE — Progress Notes (Signed)
Name: Mason Houston   MRN: [id number]    DOB: 11/14/1950   Date:07/03/2015       Progress Note  Subjective  Chief Complaint  Chief Complaint  Patient presents with  . Annual Exam    CPE    HPI  Pt. Presents for a Complete Physical Exam.  Last Colonoscopy was 3-4 years ago, was reportedly normal. Last DRE was 2 years ago.  Past Medical History  Diagnosis Date  . GERD (gastroesophageal reflux disease)   . Osteoarthrosis, unspecified whether generalized or localized, lower leg   . H/O Bell's palsy   . History of [place] measles   . Atrial fibrillation ([place])   . Hypertension   . Mini stroke ([place])   . Sixth cranial nerve palsy   . Fibromyalgia   . Collagen vascular disease ([place])   . Cancer ([place])   . Stroke ([place])   . Sleep apnea     Past Surgical History  Procedure Laterality Date  . Cyst excision    . Trigger finger release    . Ercp N/A 12/29/2014    Procedure: ENDOSCOPIC RETROGRADE CHOLANGIOPANCREATOGRAPHY (ERCP);  Surgeon: Hulen Luster, MD;  Location: [facility] ENDOSCOPY;  Service: Endoscopy;  Laterality: N/A;  . Peripheral vascular catheterization N/A 01/31/2015    Procedure: Porta Cath Insertion;  Surgeon: Katha Cabal, MD;  Location: [place] CV LAB;  Service: Cardiovascular;  Laterality: N/A;  . Ercp N/A 02/21/2015    Procedure: ENDOSCOPIC RETROGRADE CHOLANGIOPANCREATOGRAPHY (ERCP);  Surgeon: Hulen Luster, MD;  Location: [facility] ENDOSCOPY;  Service: Gastroenterology;  Laterality: N/A;    Family History  Problem Relation Age of Onset  . Hypertension Mother     Social History   Social History  . Marital Status: Married    Spouse Name: N/A  . Number of Children: N/A  . Years of Education: N/A   Occupational History  . Not on file.   Social History Main Topics  . Smoking status: Former Smoker -- 4.50 packs/day for 15 years    Types: Cigarettes  . Smokeless tobacco: Not on file  . Alcohol Use: No  . Drug Use: Yes    Special: Marijuana     Comment: PAST   . Sexual Activity: Not on file   Other Topics Concern  . Not on file   Social History Narrative     Current outpatient prescriptions:  .  acetaminophen (TYLENOL) 500 MG tablet, Take 1,000 mg by mouth every 6 (six) hours as needed for mild pain, fever or headache. , Disp: , Rfl:  .  b complex vitamins capsule, Take 1 capsule by mouth at bedtime., Disp: , Rfl:  .  diltiazem (TIAZAC) 360 MG 24 hr capsule, Take 360 mg by mouth daily., Disp: , Rfl:  .  lidocaine-prilocaine (EMLA) cream, Apply 1 application topically as needed (prior to accessing port). Reported on 06/29/2015, Disp: , Rfl:  .  loratadine (CLARITIN) 10 MG tablet, Take 10 mg by mouth daily as needed for allergies. , Disp: , Rfl:  .  magnesium oxide (MAG-OX) 400 (241.3 Mg) MG tablet, Take 400 mg by mouth at bedtime. , Disp: , Rfl:  .  metoprolol tartrate (LOPRESSOR) 25 MG tablet, Take 1 tablet (25 mg total) by mouth 2 (two) times daily., Disp: 60 tablet, Rfl: 3 .  omeprazole (PRILOSEC) 20 MG capsule, Take 20 mg by mouth at bedtime., Disp: , Rfl:  .  rivaroxaban (XARELTO) 20 MG TABS tablet, Take 1 tablet (20 mg total) by mouth  daily with supper., Disp: 30 tablet, Rfl: 3 .  furosemide (LASIX) 20 MG tablet, Take 20 mg by mouth daily. , Disp: , Rfl:   Allergies  Allergen Reactions  . Celebrex [Celecoxib] Hives  . Penicillins Hives and Other (See Comments)    Has patient had a PCN reaction causing immediate rash, facial/tongue/throat swelling, SOB or lightheadedness with hypotension: No Has patient had a PCN reaction causing severe rash involving mucus membranes or skin necrosis: No Has patient had a PCN reaction that required hospitalization No Has patient had a PCN reaction occurring within the last 10 years: No If all of the above answers are "NO", then may proceed with Cephalosporin use.     Review of Systems  Constitutional: Negative for fever, chills and malaise/fatigue.  Eyes: Negative for blurred vision and double  vision.  Respiratory: Negative for cough and shortness of breath.   Cardiovascular: Positive for leg swelling. Negative for chest pain and palpitations.  Gastrointestinal: Negative for heartburn, nausea, vomiting, abdominal pain, blood in stool and melena.  Genitourinary: Negative for dysuria and hematuria.  Neurological: Negative for headaches.  Psychiatric/Behavioral: Positive for depression (feeling depressed recently because of fluid retention from Prednisone.). The patient is not nervous/anxious and does not have insomnia.     Objective  Filed Vitals:   07/03/15 0908  BP: 110/72  Pulse: 75  Temp: 98.8 F (37.1 C)  TempSrc: Oral  Resp: 18  Height: 5\' 7"  (1.702 m)  Weight: 262 lb (118.842 kg)  SpO2: 95%    Physical Exam  Constitutional: He is oriented to person, place, and time and well-developed, well-nourished, and in no distress.  HENT:  Head: Normocephalic and atraumatic.  Cardiovascular: Normal rate, regular rhythm and normal heart sounds.   No murmur heard. Pulmonary/Chest: Effort normal and breath sounds normal. He has no wheezes.  Abdominal: Soft. Bowel sounds are normal. There is no tenderness.  Genitourinary: Prostate normal. Rectal exam shows external hemorrhoid.  Musculoskeletal: Normal range of motion.       Right ankle: He exhibits swelling.       Left ankle: He exhibits swelling.  3+ pitting edema, lower extremity erythema and tightness.  Neurological: He is alert and oriented to person, place, and time.  Psychiatric: Mood, memory, affect and judgment normal.  Nursing note and vitals reviewed.      Assessment & Plan  1. Annual physical exam  - Lipid Profile - PSA - TSH - Vitamin D (25 hydroxy) - POCT HgB A1C  2. Leg swelling  Swelling likely from prednisone therapy, now off prednisone. Advised to elevate legs, increase fluids to avoid possible intravascular volume depletion. Has taken short course of diuretic therapy by cardiology.   Mason Houston  [facility] 07/03/2015 9:33 AM

## 2015-07-04 LAB — LIPID PANEL
Chol/HDL Ratio: 4.1 ratio units (ref 0.0–5.0)
Cholesterol, Total: 181 mg/dL (ref 100–199)
HDL: 44 mg/dL (ref >39)
LDL Calculated: 122 mg/dL — ABNORMAL HIGH (ref 0–99)
Triglycerides: 74 mg/dL (ref 0–149)
VLDL Cholesterol Cal: 15 mg/dL (ref 5–40)

## 2015-07-04 LAB — VITAMIN D 25 HYDROXY (VIT D DEFICIENCY, FRACTURES): Vit D, 25-Hydroxy: 23 ng/mL — ABNORMAL LOW (ref 30.0–100.0)

## 2015-07-04 LAB — TSH: TSH: 1.13 u[IU]/mL (ref 0.450–4.500)

## 2015-07-04 LAB — PSA: Prostate Specific Ag, Serum: 1.5 ng/mL (ref 0.0–4.0)

## 2015-07-05 ENCOUNTER — Other Ambulatory Visit: Payer: Self-pay | Admitting: Cardiovascular Disease

## 2015-07-11 ENCOUNTER — Other Ambulatory Visit: Payer: Self-pay

## 2015-07-11 ENCOUNTER — Ambulatory Visit (INDEPENDENT_AMBULATORY_CARE_PROVIDER_SITE_OTHER): Payer: 59

## 2015-07-11 ENCOUNTER — Telehealth: Payer: Self-pay

## 2015-07-11 DIAGNOSIS — R600 Localized edema: Secondary | ICD-10-CM | POA: Diagnosis not present

## 2015-07-11 DIAGNOSIS — R0600 Dyspnea, unspecified: Secondary | ICD-10-CM

## 2015-07-11 LAB — ECHOCARDIOGRAM COMPLETE
E decel time: 187 msec
E/e' ratio: 6.68
FS: 32 % (ref 28–44)
IVS/LV PW RATIO, ED: 0.88
LA ID, A-P, ES: 40 mm
LA diam end sys: 40 mm
LA diam index: 1.65 cm/m2
LA vol A4C: 68.9 ml
LA vol index: 31 mL/m2
LA vol: 75.2 mL
LV E/e' medial: 6.68
LV E/e'average: 6.68
LV PW d: 9.46 mm — AB (ref 0.6–1.1)
LV dias vol index: 55 mL/m2
LV dias vol: 134 mL (ref 62–150)
LV e' LATERAL: 11.4 cm/s
LV sys vol index: 21 mL/m2
LV sys vol: 51 mL (ref 21–61)
LVOT SV: 105 mL
LVOT VTI: 27.5 cm
LVOT area: 3.8 cm2
LVOT diameter: 22 mm
LVOT peak grad rest: 6 mmHg
LVOT peak vel: 123 cm/s
MV Dec: 187
MV Peak grad: 2 mmHg
MV pk A vel: 91.7 m/s
MV pk E vel: 76.2 m/s
RV sys press: 16 mmHg
Reg peak vel: 181 cm/s
Simpson's disk: 62
Stroke v: 83 ml
TDI e' lateral: 11.4
TDI e' medial: 9.68
TR max vel: 181 cm/s

## 2015-07-11 MED ORDER — VITAMIN D (ERGOCALCIFEROL) 1.25 MG (50000 UNIT) PO CAPS: 50000.0000 [IU] | ORAL_CAPSULE | ORAL | Status: DC

## 2015-07-11 NOTE — Telephone Encounter (Signed)
Lab results have been reported to patient and a prescription for Vitamin D 50,000 units take 1 capsule once a week for 12 weeks has been sent to [facility] patient has been notified and verbalized understanding

## 2015-07-18 ENCOUNTER — Ambulatory Visit: Payer: 59 | Admitting: Family Medicine

## 2015-07-23 ENCOUNTER — Other Ambulatory Visit: Payer: Self-pay | Admitting: Internal Medicine

## 2015-07-25 ENCOUNTER — Ambulatory Visit: Payer: 59 | Admitting: Family Medicine

## 2015-07-25 ENCOUNTER — Ambulatory Visit (INDEPENDENT_AMBULATORY_CARE_PROVIDER_SITE_OTHER): Payer: 59 | Admitting: Family Medicine

## 2015-07-25 ENCOUNTER — Encounter: Payer: Self-pay | Admitting: Family Medicine

## 2015-07-25 VITALS — BP 118/70 | HR 73 | Temp 98.2°F | Resp 18 | Ht 67.0 in | Wt 257.1 lb

## 2015-07-25 DIAGNOSIS — E785 Hyperlipidemia, unspecified: Secondary | ICD-10-CM | POA: Diagnosis not present

## 2015-07-25 DIAGNOSIS — Z8673 Personal history of transient ischemic attack (TIA), and cerebral infarction without residual deficits: Secondary | ICD-10-CM | POA: Diagnosis not present

## 2015-07-25 DIAGNOSIS — R600 Localized edema: Secondary | ICD-10-CM | POA: Diagnosis not present

## 2015-07-25 NOTE — Progress Notes (Signed)
Name: Mason [place]   MRN: [id number]    DOB: Nov 08, 1950   Date:07/25/2015       Progress Note  Subjective  Chief Complaint  Chief Complaint  Patient presents with  . discuss statin therapy    Hyperlipidemia This is [place] chronic problem. The problem is uncontrolled. Recent lipid tests were reviewed and are high. Exacerbating diseases include obesity. Pertinent negatives include no chest pain or shortness of breath. Current antihyperlipidemic treatment includes diet change (recently on [place] Ketogenic diet, is trying to lose weight.). There are no compliance problems.  Risk factors for coronary artery disease include dyslipidemia, male sex, obesity and [place] sedentary lifestyle.    Past Medical History  Diagnosis Date  . GERD (gastroesophageal reflux disease)   . Osteoarthrosis, unspecified whether generalized or localized, lower leg   . H/O Bell's palsy   . History of [place] measles   . Atrial fibrillation ([place])   . Hypertension   . Mini stroke ([place])   . Sixth cranial nerve palsy   . Fibromyalgia   . Collagen vascular disease ([place])   . Cancer ([place])   . Stroke ([place])   . Sleep apnea     Past Surgical History  Procedure Laterality Date  . Cyst excision    . Trigger finger release    . Ercp N/[place] 12/29/2014    Procedure: ENDOSCOPIC RETROGRADE CHOLANGIOPANCREATOGRAPHY (ERCP);  Surgeon: Hulen Luster, MD;  Location: [facility] ENDOSCOPY;  Service: Endoscopy;  Laterality: N/[place];  . Peripheral vascular catheterization N/[place] 01/31/2015    Procedure: Porta Cath Insertion;  Surgeon: Katha Cabal, MD;  Location: [place] CV LAB;  Service: Cardiovascular;  Laterality: N/[place];  . Ercp N/[place] 02/21/2015    Procedure: ENDOSCOPIC RETROGRADE CHOLANGIOPANCREATOGRAPHY (ERCP);  Surgeon: Hulen Luster, MD;  Location: [facility] ENDOSCOPY;  Service: Gastroenterology;  Laterality: N/[place];    Family History  Problem Relation Age of Onset  . Hypertension Mother     Social History   Social History  . Marital Status: Married    Spouse Name: N/[place]  . Number of Children: N/[place]  . Years of Education: N/[place]   Occupational History  . Not on file.   Social History Main Topics  . Smoking status: Former Smoker -- 4.50 packs/day for 15 years    Types: Cigarettes  . Smokeless tobacco: Not on file  . Alcohol Use: No  . Drug Use: Yes    Special: Marijuana     Comment: PAST  . Sexual Activity: Not on file   Other Topics Concern  . Not on file   Social History Narrative     Current outpatient prescriptions:  .  acetaminophen (TYLENOL) 500 MG tablet, Take 1,000 mg by mouth every 6 (six) hours as needed for mild pain, fever or headache. , Disp: , Rfl:  .  b complex vitamins capsule, Take 1 capsule by mouth at bedtime., Disp: , Rfl:  .  diltiazem (TIAZAC) 360 MG 24 hr capsule, Take 360 mg by mouth daily., Disp: , Rfl:  .  diltiazem (TIAZAC) 360 MG 24 hr capsule, TAKE 1 CAPSULE BY MOUTH EVERY DAY, Disp: 30 capsule, Rfl: 3 .  furosemide (LASIX) 20 MG tablet, Take 20 mg by mouth daily. , Disp: , Rfl:  .  lidocaine-prilocaine (EMLA) cream, Apply 1 application topically as needed (prior to accessing port). Reported on 06/29/2015, Disp: , Rfl:  .  loratadine (CLARITIN) 10 MG tablet, Take 10 mg by mouth daily as needed for allergies. , Disp: , Rfl:  .  magnesium oxide (MAG-OX) 400 (241.3 Mg) MG tablet, Take 400 mg by mouth at bedtime. , Disp: , Rfl:  .  metoprolol tartrate (LOPRESSOR) 25 MG tablet, Take 1 tablet (25 mg total) by mouth 2 (two) times daily., Disp: 60 tablet, Rfl: 3 .  omeprazole (PRILOSEC) 20 MG capsule, Take 20 mg by mouth at bedtime., Disp: , Rfl:  .  omeprazole (PRILOSEC) 20 MG capsule, TAKE 1 CAPSULE(20 MG) BY MOUTH DAILY, Disp: 30 capsule, Rfl: 0 .  rivaroxaban (XARELTO) 20 MG TABS tablet, Take 1 tablet (20 mg total) by mouth daily with supper., Disp: 30 tablet, Rfl: 3 .  Vitamin D, Ergocalciferol, (DRISDOL) 50000 units CAPS capsule, Take 1 capsule (50,000 Units total) by mouth once [place] week. For 12 weeks, Disp: 12  capsule, Rfl: 0  Allergies  Allergen Reactions  . Celebrex [Celecoxib] Hives  . Penicillins Hives and Other (See Comments)    Has patient had [place] PCN reaction causing immediate rash, facial/tongue/throat swelling, SOB or lightheadedness with hypotension: No Has patient had [place] PCN reaction causing severe rash involving mucus membranes or skin necrosis: No Has patient had [place] PCN reaction that required hospitalization No Has patient had [place] PCN reaction occurring within the last 10 years: No If all of the above answers are "NO", then may proceed with Cephalosporin use.    Review of Systems  Respiratory: Negative for shortness of breath.   Cardiovascular: Positive for leg swelling. Negative for chest pain.  Gastrointestinal: Negative for abdominal pain.    Objective  Filed Vitals:   07/25/15 0823  BP: 118/70  Pulse: 73  Temp: 98.2 F (36.8 C)  Resp: 18  Height: 5\' 7"  (1.702 m)  Weight: 257 lb 1 oz (116.603 kg)  SpO2: 98%    Physical Exam  Constitutional: He is oriented to person, place, and time and well-developed, well-nourished, and in no distress.  HENT:  Head: Normocephalic and atraumatic.  Cardiovascular: Normal rate, regular rhythm and normal heart sounds.   No murmur heard. Pulmonary/Chest: Effort normal and breath sounds normal.  Musculoskeletal:       Right ankle: He exhibits swelling. Tenderness.       Left ankle: He exhibits swelling. Tenderness.  Neurological: He is alert and oriented to person, place, and time.  Psychiatric: Mood, memory, affect and judgment normal.  Nursing note and vitals reviewed.     Assessment & Plan  1. Hyperlipidemia LDL goal <100 Discussed starting on statin therapy, patient has started [place] new diet to lose weight and requesting to recheck FLP in [place] few months to assess  2. History of stroke On anticoagulation.  3. Bilateral leg edema Improving, continue on Lasix, elevation of legs especially in the afternoon.     Mason Houston Mason [place].  [facility] 07/25/2015 8:59 AM

## 2015-07-30 ENCOUNTER — Inpatient Hospital Stay: Payer: 59 | Attending: Internal Medicine

## 2015-07-30 ENCOUNTER — Ambulatory Visit: Payer: 59

## 2015-07-30 DIAGNOSIS — C8339 Diffuse large B-cell lymphoma, extranodal and solid organ sites: Secondary | ICD-10-CM | POA: Insufficient documentation

## 2015-07-30 DIAGNOSIS — I10 Essential (primary) hypertension: Secondary | ICD-10-CM | POA: Diagnosis not present

## 2015-07-30 DIAGNOSIS — M7989 Other specified soft tissue disorders: Secondary | ICD-10-CM | POA: Insufficient documentation

## 2015-07-30 DIAGNOSIS — M797 Fibromyalgia: Secondary | ICD-10-CM | POA: Diagnosis not present

## 2015-07-30 DIAGNOSIS — Z79899 Other long term (current) drug therapy: Secondary | ICD-10-CM | POA: Insufficient documentation

## 2015-07-30 DIAGNOSIS — Z87891 Personal history of nicotine dependence: Secondary | ICD-10-CM | POA: Insufficient documentation

## 2015-07-30 DIAGNOSIS — K219 Gastro-esophageal reflux disease without esophagitis: Secondary | ICD-10-CM | POA: Diagnosis not present

## 2015-07-30 DIAGNOSIS — C8330 Diffuse large B-cell lymphoma, unspecified site: Secondary | ICD-10-CM

## 2015-07-30 DIAGNOSIS — Z7901 Long term (current) use of anticoagulants: Secondary | ICD-10-CM | POA: Diagnosis not present

## 2015-07-30 DIAGNOSIS — Z452 Encounter for adjustment and management of vascular access device: Secondary | ICD-10-CM | POA: Insufficient documentation

## 2015-07-30 DIAGNOSIS — I4891 Unspecified atrial fibrillation: Secondary | ICD-10-CM | POA: Diagnosis not present

## 2015-07-30 DIAGNOSIS — G4730 Sleep apnea, unspecified: Secondary | ICD-10-CM | POA: Insufficient documentation

## 2015-07-30 DIAGNOSIS — Z8673 Personal history of transient ischemic attack (TIA), and cerebral infarction without residual deficits: Secondary | ICD-10-CM | POA: Insufficient documentation

## 2015-07-30 MED ORDER — SODIUM CHLORIDE 0.9% FLUSH
10.0000 mL | INTRAVENOUS | Status: DC | PRN
  Administered 2015-07-30: 10 mL via INTRAVENOUS
  Filled 2015-07-30: qty 10

## 2015-07-30 MED ORDER — HEPARIN SOD (PORK) LOCK FLUSH 100 UNIT/ML IV SOLN
500.0000 [IU] | Freq: Once | INTRAVENOUS | Status: AC
  Administered 2015-07-30: 500 [IU] via INTRAVENOUS
  Filled 2015-07-30: qty 5

## 2015-08-03 ENCOUNTER — Ambulatory Visit
Admission: RE | Admit: 2015-08-03 | Discharge: 2015-08-03 | Disposition: A | Discharge status: Home / Self Care | Payer: 59 | Source: Ambulatory Visit | Attending: Internal Medicine | Admitting: Internal Medicine

## 2015-08-03 DIAGNOSIS — C8330 Diffuse large B-cell lymphoma, unspecified site: Secondary | ICD-10-CM | POA: Insufficient documentation

## 2015-08-03 DIAGNOSIS — I2510 Atherosclerotic heart disease of native coronary artery without angina pectoris: Secondary | ICD-10-CM | POA: Insufficient documentation

## 2015-08-03 LAB — GLUCOSE, CAPILLARY: Glucose-Capillary: 127 mg/dL — ABNORMAL HIGH (ref 65–99)

## 2015-08-03 MED ORDER — FLUDEOXYGLUCOSE F - 18 (FDG) INJECTION
12.2200 | Freq: Once | INTRAVENOUS | Status: AC | PRN
  Administered 2015-08-03: 12.22 via INTRAVENOUS

## 2015-08-06 ENCOUNTER — Inpatient Hospital Stay (HOSPITAL_BASED_OUTPATIENT_CLINIC_OR_DEPARTMENT_OTHER): Payer: 59 | Admitting: Internal Medicine

## 2015-08-06 ENCOUNTER — Inpatient Hospital Stay: Payer: 59

## 2015-08-06 VITALS — BP 144/84 | HR 66 | Temp 96.7°F | Resp 18 | Wt 254.5 lb

## 2015-08-06 DIAGNOSIS — M7989 Other specified soft tissue disorders: Secondary | ICD-10-CM

## 2015-08-06 DIAGNOSIS — I4891 Unspecified atrial fibrillation: Secondary | ICD-10-CM

## 2015-08-06 DIAGNOSIS — C8330 Diffuse large B-cell lymphoma, unspecified site: Secondary | ICD-10-CM

## 2015-08-06 DIAGNOSIS — Z7901 Long term (current) use of anticoagulants: Secondary | ICD-10-CM

## 2015-08-06 DIAGNOSIS — C8339 Diffuse large B-cell lymphoma, extranodal and solid organ sites: Secondary | ICD-10-CM

## 2015-08-06 DIAGNOSIS — Z79899 Other long term (current) drug therapy: Secondary | ICD-10-CM

## 2015-08-06 LAB — CBC WITH DIFFERENTIAL/PLATELET
Basophils Absolute: 0.1 10*3/uL (ref 0–0.1)
Basophils Relative: 1 %
Eosinophils Absolute: 0.4 10*3/uL (ref 0–0.7)
Eosinophils Relative: 7 %
HCT: 39.9 % — ABNORMAL LOW (ref 40.0–52.0)
Hemoglobin: 13.5 g/dL (ref 13.0–18.0)
Lymphocytes Relative: 27 %
Lymphs Abs: 1.6 10*3/uL (ref 1.0–3.6)
MCH: 27.7 pg (ref 26.0–34.0)
MCHC: 33.7 g/dL (ref 32.0–36.0)
MCV: 82.3 fL (ref 80.0–100.0)
Monocytes Absolute: 0.9 10*3/uL (ref 0.2–1.0)
Monocytes Relative: 15 %
Neutro Abs: 3 10*3/uL (ref 1.4–6.5)
Neutrophils Relative %: 50 %
Platelets: 400 10*3/uL (ref 150–440)
RBC: 4.85 MIL/uL (ref 4.40–5.90)
RDW: 15.1 % — ABNORMAL HIGH (ref 11.5–14.5)
WBC: 6 10*3/uL (ref 3.8–10.6)

## 2015-08-06 LAB — COMPREHENSIVE METABOLIC PANEL
ALT: 26 U/L (ref 17–63)
AST: 32 U/L (ref 15–41)
Albumin: 4.5 g/dL (ref 3.5–5.0)
Alkaline Phosphatase: 100 U/L (ref 38–126)
Anion gap: 8 (ref 5–15)
BUN: 21 mg/dL — ABNORMAL HIGH (ref 6–20)
CO2: 25 mmol/L (ref 22–32)
Calcium: 9.2 mg/dL (ref 8.9–10.3)
Chloride: 102 mmol/L (ref 101–111)
Creatinine, Ser: 1 mg/dL (ref 0.61–1.24)
GFR calc Af Amer: >60 mL/min (ref >60)
GFR calc non Af Amer: >60 mL/min (ref >60)
Glucose, Bld: 112 mg/dL — ABNORMAL HIGH (ref 65–99)
Potassium: 4.4 mmol/L (ref 3.5–5.1)
Sodium: 135 mmol/L (ref 135–145)
Total Bilirubin: 0.7 mg/dL (ref 0.3–1.2)
Total Protein: 7.8 g/dL (ref 6.5–8.1)

## 2015-08-06 NOTE — Assessment & Plan Note (Signed)
#   DLBCL of pancreatic head-  Stage IE.Resolution after 3 cycles on PET scan. Currently status post 5 cycles of R CHOP chemotherapy.  PET July 2017- NED except for slight uptake around the pan head/ ? Stent irritation.   # Pneumonia/question pneumocystis on a clinical basis- resolved.   # Bilateral lower extremity swelling/erythema- likely from Bactrim/steroids- improving.   #  A.fib on xarelto as per cardiology.   # s/p biliary stent-  Okay to have stent extraction. Pt will call GI [Dr.Oh] for extraction.   # Mediport- continue port flushes; if PET scan negative then we can have it explanted.  # Call us back re: stent ex-planation for PET scan scheduling [PET to be scheduled to be done 3 months post stent extraction]   I reviewed the images myself and with the patient and family in detail. A copy of this report was given.  # # 25 minutes face-to-face with the patient discussing the above plan of care; more than 50% of time spent on prognosis/ natural history; counseling and coordination.

## 2015-08-06 NOTE — Progress Notes (Signed)
Patient here today for PET results. 

## 2015-08-06 NOTE — Progress Notes (Signed)
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 A.fib- off xarelto [mild blood in urine]     DLBCL (diffuse large B cell lymphoma) ([place])   01/19/2015 Initial Diagnosis    DLBCL (diffuse large B cell lymphoma) (HCC)       INTERVAL HISTORY:  65 year old  Male patient with above history of diffuse large B-cell lymphoma stage IE peripancreatic region currently R CHOP  Status post 5 treatments; and also pneumocystis pneumonia Status post Bactrim- Is here today to review the results of his restaging PET scan.  Patient is trying to lose weight; modified his diet. Continues to have intermittent swelling in the legs mostly dependent. Not on any diuretics.  No nausea no vomiting. Appetite is good.Patient denies any tingling and numbness in the hand and feet. No cough or shortness of breath or chest pain. no cough. No shortness of breath with exertion.   REVIEW OF SYSTEMS:  A complete 10 point review of system is done which is negative except mentioned above/history of present illness.   PAST MEDICAL HISTORY :  Past Medical History:  Diagnosis Date  . Atrial fibrillation ([place])   . Cancer ([place])   . Collagen vascular disease ([place])   . Fibromyalgia   . GERD (gastroesophageal reflux disease)   . H/O Bell's palsy   . History of [place] measles   . Hypertension   . Mini stroke ([place])   . Osteoarthrosis, unspecified whether  generalized or localized, lower leg   . Sixth cranial nerve palsy   . Sleep apnea   . Stroke [facility])     PAST SURGICAL HISTORY :   Past Surgical History:  Procedure Laterality Date  . CYST EXCISION    . ERCP N/A 12/29/2014   Procedure: ENDOSCOPIC RETROGRADE CHOLANGIOPANCREATOGRAPHY (ERCP);  Surgeon: Hulen Luster, MD;  Location: [facility] ENDOSCOPY;  Service: Endoscopy;  Laterality: N/A;  . ERCP N/A 02/21/2015   Procedure: ENDOSCOPIC RETROGRADE CHOLANGIOPANCREATOGRAPHY (ERCP);  Surgeon: Hulen Luster, MD;  Location: [facility] ENDOSCOPY;  Service: Gastroenterology;  Laterality: N/A;  . PERIPHERAL VASCULAR CATHETERIZATION N/A 01/31/2015   Procedure: Glori Luis Cath Insertion;  Surgeon: Katha Cabal, MD;  Location: [place] CV LAB;  Service: Cardiovascular;  Laterality: N/A;  . TRIGGER FINGER RELEASE      FAMILY HISTORY :   Family History  Problem Relation Age of Onset  . Hypertension Mother     SOCIAL HISTORY:   Social History  Substance Use Topics  . Smoking status: Former Smoker    Packs/day: 4.50    Years: 15.00    Types: Cigarettes  . Smokeless tobacco: Not on file  . Alcohol use No    ALLERGIES:  is allergic to celebrex [celecoxib] and penicillins.  MEDICATIONS:  Current Outpatient Prescriptions  Medication Sig Dispense Refill  . acetaminophen (TYLENOL) 500 MG tablet Take 1,000 mg by mouth every 6 (six) hours as needed for mild pain, fever or  headache.     . b complex vitamins capsule Take 1 capsule by mouth at bedtime.    Marland Kitchen diltiazem (TIAZAC) 360 MG 24 hr capsule Take 360 mg by mouth daily.    Marland Kitchen diltiazem (TIAZAC) 360 MG 24 hr capsule TAKE 1 CAPSULE BY MOUTH EVERY DAY 30 capsule 3  . lidocaine-prilocaine (EMLA) cream Apply 1 application topically as needed (prior to accessing port). Reported on 06/29/2015    . loratadine (CLARITIN) 10 MG tablet Take 10 mg by mouth daily as needed for allergies.     . magnesium oxide (MAG-OX) 400 (241.3 Mg) MG tablet Take 400 mg by mouth at bedtime.      . metoprolol tartrate (LOPRESSOR) 25 MG tablet Take 1 tablet (25 mg total) by mouth 2 (two) times daily. 60 tablet 3  . omeprazole (PRILOSEC) 20 MG capsule Take 20 mg by mouth at bedtime.    Marland Kitchen omeprazole (PRILOSEC) 20 MG capsule TAKE 1 CAPSULE(20 MG) BY MOUTH DAILY 30 capsule 0  . rivaroxaban (XARELTO) 20 MG TABS tablet Take 1 tablet (20 mg total) by mouth daily with supper. 30 tablet 3  . Vitamin D, Ergocalciferol, (DRISDOL) 50000 units CAPS capsule Take 1 capsule (50,000 Units total) by mouth once a week. For 12 weeks 12 capsule 0  . furosemide (LASIX) 20 MG tablet Take 20 mg by mouth daily.      No current facility-administered medications for this visit.     PHYSICAL EXAMINATION: ECOG PERFORMANCE STATUS: 0 - Asymptomatic  BP (!) 144/84 (BP Location: Left Arm, Patient Position: Sitting)   Pulse 66   Temp (!) 96.7 F (35.9 C) (Tympanic)   Resp 18   Wt 254 lb 8 oz (115.4 kg)   BMI 39.86 kg/m   Filed Weights   08/06/15 1500  Weight: 254 lb 8 oz (115.4 kg)    GENERAL: Well-nourished well-developed; Alert, no distress and comfortable. He is alone. EYES: no pallor or icterus OROPHARYNX: no thrush or ulceration; good dentition  NECK: supple, no masses felt LYMPH:  no palpable lymphadenopathy in the cervical, axillary or inguinal regions LUNGS: decreased breath sounds to auscultation and  No wheeze or crackles HEART/CVS: regular rate & rhythm and no murmurs; No lower extremity edema ABDOMEN:abdomen soft, non-tender and normal bowel sounds Musculoskeletal:no cyanosis of digits and no clubbing  PSYCH: alert & oriented x 3 with fluent speech NEURO: no focal motor/sensory deficits SKIN:  no rashes or significant lesions; mediport accessed.   LABORATORY DATA:  I have reviewed the data as listed    Component Value Date/Time   NA 135 08/06/2015 1410   NA 142 05/15/2015 1156   NA 139 12/19/2011 1044   K 4.4 08/06/2015 1410   K 4.1 12/19/2011 1044   CL 102 08/06/2015 1410   CL  106 12/19/2011 1044   CO2 25 08/06/2015 1410   CO2 26 12/19/2011 1044   GLUCOSE 112 (H) 08/06/2015 1410   GLUCOSE 95 12/19/2011 1044   BUN 21 (H) 08/06/2015 1410   BUN 16 05/15/2015 1156   BUN 18 12/19/2011 1044   CREATININE 1.00 08/06/2015 1410   CREATININE 1.06 12/19/2011 1044   CALCIUM 9.2 08/06/2015 1410   CALCIUM 9.0 12/19/2011 1044   PROT 7.8 08/06/2015 1410   PROT 6.1 05/15/2015 1156   PROT 9.0 (H) 12/19/2011 1044   ALBUMIN 4.5 08/06/2015 1410   ALBUMIN 3.7 05/15/2015 1156   ALBUMIN 4.4 12/19/2011 1044   AST 32 08/06/2015 1410   AST 44 (H) 12/19/2011  1044   ALT 26 08/06/2015 1410   ALT 46 12/19/2011 1044   ALKPHOS 100 08/06/2015 1410   ALKPHOS 110 12/19/2011 1044   BILITOT 0.7 08/06/2015 1410   BILITOT 0.2 05/15/2015 1156   BILITOT 0.7 12/19/2011 1044   GFRNONAA >60 08/06/2015 1410   GFRNONAA >60 12/19/2011 1044   GFRAA >60 08/06/2015 1410   GFRAA >60 12/19/2011 1044    No results found for: SPEP, UPEP  Lab Results  Component Value Date   WBC 6.0 08/06/2015   NEUTROABS 3.0 08/06/2015   HGB 13.5 08/06/2015   HCT 39.9 (L) 08/06/2015   MCV 82.3 08/06/2015   PLT 400 08/06/2015      Chemistry      Component Value Date/Time   NA 135 08/06/2015 1410   NA 142 05/15/2015 1156   NA 139 12/19/2011 1044   K 4.4 08/06/2015 1410   K 4.1 12/19/2011 1044   CL 102 08/06/2015 1410   CL 106 12/19/2011 1044   CO2 25 08/06/2015 1410   CO2 26 12/19/2011 1044   BUN 21 (H) 08/06/2015 1410   BUN 16 05/15/2015 1156   BUN 18 12/19/2011 1044   CREATININE 1.00 08/06/2015 1410   CREATININE 1.06 12/19/2011 1044      Component Value Date/Time   CALCIUM 9.2 08/06/2015 1410   CALCIUM 9.0 12/19/2011 1044   ALKPHOS 100 08/06/2015 1410   ALKPHOS 110 12/19/2011 1044   AST 32 08/06/2015 1410   AST 44 (H) 12/19/2011 1044   ALT 26 08/06/2015 1410   ALT 46 12/19/2011 1044   BILITOT 0.7 08/06/2015 1410   BILITOT 0.2 05/15/2015 1156   BILITOT 0.7 12/19/2011 1044        ASSESSMENT & PLAN:   DLBCL (diffuse large B cell lymphoma) (HCC) # DLBCL of pancreatic head-  Stage IE.Resolution after 3 cycles on PET scan. Currently status post 5 cycles of R CHOP chemotherapy.  PET July 2017- NED except for slight uptake around the pan head/ ? Stent irritation.   # Pneumonia/question pneumocystis on a clinical basis- resolved.   # Bilateral lower extremity swelling/erythema- likely from Bactrim/steroids- improving.   #  A.fib on xarelto as per cardiology.   # s/p biliary stent-  Okay to have stent extraction. Pt will call GI [Dr.Oh] for extraction.   # Mediport- continue port flushes; if PET scan negative then we can have it explanted.  # Call us back re: stent ex-planation for PET scan scheduling [PET to be scheduled to be done 3 months post stent extraction]   I reviewed the images myself and with the patient and family in detail. A copy of this report was given.  # # 25 minutes face-to-face with the patient discussing the above plan of care; more than 50% of time spent on prognosis/ natural history; counseling and coordination.      Cammie Sickle, MD 08/06/2015 5:11 PM

## 2015-08-10 ENCOUNTER — Ambulatory Visit
Admission: RE | Admit: 2015-08-10 | Discharge: 2015-08-10 | Disposition: A | Discharge status: Home / Self Care | Payer: 59 | Source: Ambulatory Visit | Attending: Gastroenterology | Admitting: Gastroenterology

## 2015-08-10 ENCOUNTER — Other Ambulatory Visit: Payer: Self-pay

## 2015-08-10 DIAGNOSIS — Z4589 Encounter for adjustment and management of other implanted devices: Secondary | ICD-10-CM | POA: Insufficient documentation

## 2015-08-10 DIAGNOSIS — T85520A Displacement of bile duct prosthesis, initial encounter: Secondary | ICD-10-CM

## 2015-08-13 ENCOUNTER — Inpatient Hospital Stay: Payer: 59

## 2015-08-13 NOTE — Telephone Encounter (Signed)
Opened in error

## 2015-08-14 ENCOUNTER — Encounter
Admission: RE | Disposition: A | Discharge status: Home / Self Care | Payer: Self-pay | Source: Ambulatory Visit | Attending: Gastroenterology

## 2015-08-14 ENCOUNTER — Ambulatory Visit
Admission: RE | Admit: 2015-08-14 | Discharge: 2015-08-14 | Disposition: A | Discharge status: Home / Self Care | Payer: 59 | Source: Ambulatory Visit | Attending: Gastroenterology | Admitting: Gastroenterology

## 2015-08-14 ENCOUNTER — Ambulatory Visit: Payer: 59 | Admitting: Anesthesiology

## 2015-08-14 ENCOUNTER — Other Ambulatory Visit: Payer: Self-pay

## 2015-08-14 ENCOUNTER — Encounter: Payer: Self-pay | Admitting: *Deleted

## 2015-08-14 DIAGNOSIS — Z8572 Personal history of non-Hodgkin lymphomas: Secondary | ICD-10-CM | POA: Insufficient documentation

## 2015-08-14 DIAGNOSIS — K839 Disease of biliary tract, unspecified: Secondary | ICD-10-CM | POA: Diagnosis not present

## 2015-08-14 DIAGNOSIS — K8689 Other specified diseases of pancreas: Secondary | ICD-10-CM | POA: Diagnosis not present

## 2015-08-14 DIAGNOSIS — G4730 Sleep apnea, unspecified: Secondary | ICD-10-CM | POA: Diagnosis not present

## 2015-08-14 DIAGNOSIS — M797 Fibromyalgia: Secondary | ICD-10-CM | POA: Diagnosis not present

## 2015-08-14 DIAGNOSIS — I10 Essential (primary) hypertension: Secondary | ICD-10-CM | POA: Insufficient documentation

## 2015-08-14 DIAGNOSIS — Z880 Allergy status to penicillin: Secondary | ICD-10-CM | POA: Diagnosis not present

## 2015-08-14 DIAGNOSIS — M1990 Unspecified osteoarthritis, unspecified site: Secondary | ICD-10-CM | POA: Insufficient documentation

## 2015-08-14 DIAGNOSIS — G709 Myoneural disorder, unspecified: Secondary | ICD-10-CM | POA: Diagnosis not present

## 2015-08-14 DIAGNOSIS — K859 Acute pancreatitis without necrosis or infection, unspecified: Secondary | ICD-10-CM | POA: Diagnosis not present

## 2015-08-14 DIAGNOSIS — F329 Major depressive disorder, single episode, unspecified: Secondary | ICD-10-CM | POA: Insufficient documentation

## 2015-08-14 DIAGNOSIS — H4920 Sixth [abducent] nerve palsy, unspecified eye: Secondary | ICD-10-CM | POA: Diagnosis not present

## 2015-08-14 DIAGNOSIS — Z79899 Other long term (current) drug therapy: Secondary | ICD-10-CM | POA: Diagnosis not present

## 2015-08-14 DIAGNOSIS — Z87891 Personal history of nicotine dependence: Secondary | ICD-10-CM | POA: Insufficient documentation

## 2015-08-14 DIAGNOSIS — R079 Chest pain, unspecified: Secondary | ICD-10-CM | POA: Diagnosis not present

## 2015-08-14 DIAGNOSIS — K219 Gastro-esophageal reflux disease without esophagitis: Secondary | ICD-10-CM | POA: Insufficient documentation

## 2015-08-14 DIAGNOSIS — I4891 Unspecified atrial fibrillation: Secondary | ICD-10-CM | POA: Insufficient documentation

## 2015-08-14 DIAGNOSIS — Z4659 Encounter for fitting and adjustment of other gastrointestinal appliance and device: Secondary | ICD-10-CM

## 2015-08-14 DIAGNOSIS — Z8673 Personal history of transient ischemic attack (TIA), and cerebral infarction without residual deficits: Secondary | ICD-10-CM | POA: Diagnosis not present

## 2015-08-14 HISTORY — PX: ERCP: SHX5425

## 2015-08-14 SURGERY — ERCP, WITH INTERVENTION IF INDICATED
Anesthesia: General

## 2015-08-14 MED ORDER — FENTANYL CITRATE (PF) 100 MCG/2ML IJ SOLN
INTRAMUSCULAR | Status: DC | PRN
  Administered 2015-08-14: 50 ug via INTRAVENOUS

## 2015-08-14 MED ORDER — IPRATROPIUM-ALBUTEROL 0.5-2.5 (3) MG/3ML IN SOLN
RESPIRATORY_TRACT | Status: AC
  Filled 2015-08-14: qty 3

## 2015-08-14 MED ORDER — SODIUM CHLORIDE 0.9 % IV SOLN
INTRAVENOUS | Status: DC
  Administered 2015-08-14 (×2): via INTRAVENOUS
  Administered 2015-08-14: 1000 mL via INTRAVENOUS

## 2015-08-14 MED ORDER — NEOSTIGMINE METHYLSULFATE 10 MG/10ML IV SOLN
INTRAVENOUS | Status: DC | PRN
  Administered 2015-08-14: 3 mg via INTRAVENOUS

## 2015-08-14 MED ORDER — PROPOFOL 10 MG/ML IV BOLUS
INTRAVENOUS | Status: DC | PRN
  Administered 2015-08-14: 200 mg via INTRAVENOUS

## 2015-08-14 MED ORDER — SUCCINYLCHOLINE CHLORIDE 20 MG/ML IJ SOLN
INTRAMUSCULAR | Status: DC | PRN
  Administered 2015-08-14: 120 mg via INTRAVENOUS

## 2015-08-14 MED ORDER — IPRATROPIUM-ALBUTEROL 0.5-2.5 (3) MG/3ML IN SOLN
3.0000 mL | Freq: Once | RESPIRATORY_TRACT | Status: AC
  Administered 2015-08-14: 3 mL via RESPIRATORY_TRACT

## 2015-08-14 MED ORDER — ROCURONIUM BROMIDE 100 MG/10ML IV SOLN
INTRAVENOUS | Status: DC | PRN
  Administered 2015-08-14: 5 mg via INTRAVENOUS
  Administered 2015-08-14: 45 mg via INTRAVENOUS

## 2015-08-14 MED ORDER — GLYCOPYRROLATE 0.2 MG/ML IJ SOLN
INTRAMUSCULAR | Status: DC | PRN
  Administered 2015-08-14: 0.6 mg via INTRAVENOUS

## 2015-08-14 MED ORDER — LIDOCAINE HCL (CARDIAC) 20 MG/ML IV SOLN
INTRAVENOUS | Status: DC | PRN
  Administered 2015-08-14: 30 mg via INTRAVENOUS

## 2015-08-14 MED ORDER — MIDAZOLAM HCL 2 MG/2ML IJ SOLN
INTRAMUSCULAR | Status: DC | PRN
Start: 2015-08-14 — End: 2015-08-14
  Administered 2015-08-14: 1 mg via INTRAVENOUS

## 2015-08-14 MED ORDER — SUGAMMADEX SODIUM 200 MG/2ML IV SOLN
INTRAVENOUS | Status: DC | PRN
  Administered 2015-08-14: 200 mg via INTRAVENOUS

## 2015-08-14 NOTE — H&P (Signed)
Mason Lame, MD [facility] [address] [place], [place] [address] Phone: [phone] Fax : [phone]  Primary Care Physician:  Keith Rake, MD Primary Gastroenterologist:  Dr. Allen Norris  Pre-Procedure History & Physical: HPI:  Mason [place] is a 65 y.o. male is here for an ERCP.   Past Medical History:  Diagnosis Date  . Atrial fibrillation ([place])   . Cancer ([place])   . Collagen vascular disease ([place])   . Fibromyalgia   . GERD (gastroesophageal reflux disease)   . H/O Bell's palsy   . History of [place] measles   . Hypertension   . Mini stroke ([place])   . Osteoarthrosis, unspecified whether generalized or localized, lower leg   . Sixth cranial nerve palsy   . Sleep apnea   . Stroke [facility])     Past Surgical History:  Procedure Laterality Date  . CYST EXCISION    . ERCP N/A 12/29/2014   Procedure: ENDOSCOPIC RETROGRADE CHOLANGIOPANCREATOGRAPHY (ERCP);  Surgeon: Hulen Luster, MD;  Location: [facility] ENDOSCOPY;  Service: Endoscopy;  Laterality: N/A;  . ERCP N/A 02/21/2015   Procedure: ENDOSCOPIC RETROGRADE CHOLANGIOPANCREATOGRAPHY (ERCP);  Surgeon: Hulen Luster, MD;  Location: [facility] ENDOSCOPY;  Service: Gastroenterology;  Laterality: N/A;  . PERIPHERAL VASCULAR CATHETERIZATION N/A 01/31/2015   Procedure: Glori Luis Cath Insertion;  Surgeon: Katha Cabal, MD;  Location: [place] CV LAB;  Service: Cardiovascular;  Laterality: N/A;  . TRIGGER FINGER RELEASE      Prior to Admission medications   Medication Sig Start Date End Date Taking? Authorizing Provider  acetaminophen (TYLENOL) 500 MG tablet Take 1,000 mg by mouth every 6 (six) hours as needed for mild pain, fever or headache.    Yes Historical Provider, MD  b complex vitamins capsule Take 1 capsule by mouth at bedtime.   Yes Historical Provider, MD  diltiazem (TIAZAC) 360 MG 24 hr capsule TAKE 1 CAPSULE BY MOUTH EVERY DAY 07/05/15  Yes Wellington [place], MD  lidocaine-prilocaine (EMLA) cream Apply 1 application topically as needed  (prior to accessing port). Reported on 06/29/2015   Yes Historical Provider, MD  loratadine (CLARITIN) 10 MG tablet Take 10 mg by mouth daily as needed for allergies.    Yes Historical Provider, MD  magnesium oxide (MAG-OX) 400 (241.3 Mg) MG tablet Take 400 mg by mouth at bedtime.    Yes Historical Provider, MD  metoprolol tartrate (LOPRESSOR) 25 MG tablet Take 1 tablet (25 mg total) by mouth 2 (two) times daily. 05/28/15  Yes Wellington Hampshire, MD  omeprazole (PRILOSEC) 20 MG capsule Take 20 mg by mouth at bedtime.   Yes Historical Provider, MD  omeprazole (PRILOSEC) 20 MG capsule TAKE 1 CAPSULE(20 MG) BY MOUTH DAILY 07/23/15  Yes Cammie Sickle, MD  rivaroxaban (XARELTO) 20 MG TABS tablet Take 1 tablet (20 mg total) by mouth daily with supper. 06/29/15  Yes Wellington [place], MD  Vitamin D, Ergocalciferol, (DRISDOL) 50000 units CAPS capsule Take 1 capsule (50,000 Units total) by mouth once a week. For 12 weeks 07/11/15  Yes Roselee Nova, MD  diltiazem [facility]) 360 MG 24 hr capsule Take 360 mg by mouth daily.    Historical Provider, MD  furosemide (LASIX) 20 MG tablet Take 20 mg by mouth daily.  06/26/15 07/01/15  Historical Provider, MD    Allergies as of 08/10/2015 - Review Complete 07/25/2015  Allergen Reaction Noted  . Celebrex [celecoxib] Hives 12/23/2011  . Penicillins Hives and Other (See Comments) 12/23/2011    Family History  Problem Relation Age of Onset  . Hypertension Mother     Social History   Social History  . Marital status: Married    Spouse name: N/A  . Number of children: N/A  . Years of education: N/A   Occupational History  . Not on file.   Social History Main Topics  . Smoking status: Former Smoker    Packs/day: 4.50    Years: 15.00    Types: Cigarettes  . Smokeless tobacco: Not on file  . Alcohol use No  . Drug use:     Types: Marijuana     Comment: PAST  . Sexual activity: Not on file   Other Topics Concern  . Not on file   Social History  Narrative  . No narrative on file    Review of Systems: See HPI, otherwise negative ROS  Physical Exam: BP 134/75   Pulse 65   Temp 97.4 F (36.3 C) (Tympanic)   Resp 16   Ht 5\' 7"  (1.702 m)   Wt 254 lb (115.2 kg)   SpO2 98%   BMI 39.78 kg/m  General:   Alert,  pleasant and cooperative in NAD Head:  Normocephalic and atraumatic. Neck:  Supple; no masses or thyromegaly. Lungs:  Clear throughout to auscultation.    Heart:  Regular rate and rhythm. Abdomen:  Soft, nontender and nondistended. Normal bowel sounds, without guarding, and without rebound.   Neurologic:  Alert and  oriented x4;  grossly normal neurologically.  Impression/Plan: Delmonte Yerk is here for an ERCP to be performed for stent removal  Risks, benefits, limitations, and alternatives regarding  ERCP have been reviewed with the patient.  Questions have been answered.  All parties agreeable.   Mason Lame, MD  08/14/2015, 10:56 AM

## 2015-08-14 NOTE — Anesthesia Procedure Notes (Signed)
Procedure Name: Intubation Date/Time: 08/14/2015 1:00 PM Performed by: Courtney Paris Pre-anesthesia Checklist: Patient identified, Emergency Drugs available, Suction available, Patient being monitored and Timeout performed Patient Re-evaluated:Patient Re-evaluated prior to inductionOxygen Delivery Method: Simple face mask, Circle system utilized, Non-rebreather mask, Nasal cannula and Ambu bag Preoxygenation: Pre-oxygenation with 100% oxygen Intubation Type: IV induction and Combination inhalational/ intravenous induction Ventilation: Mask ventilation without difficulty Laryngoscope Size: Miller and 3 Grade View: Grade III Tube type: Oral Tube size: 7.5 mm Number of attempts: 2 Airway Equipment and Method: Stylet Placement Confirmation: ETT inserted through vocal cords under direct vision,  positive ETCO2,  CO2 detector and breath sounds checked- equal and bilateral Secured at: 22 cm Tube secured with: Tape Dental Injury: Bloody posterior oropharynx

## 2015-08-14 NOTE — OR Nursing (Signed)
14:25 - Patient arrived via stretcher from PACU.  Complains of new onset of chest discomfort.  Rates pain 1/10.  Dr. Allen Norris and Dr. Kayleen Memos notified.  12-lead EKG obtained as ordered which show NSR.  14:45 - Patient states he is feeling fine.  Dr. Kayleen Memos in to see patient.  To be discharged.    Patient instructed to contact MD or come to [facility] Room if chest pressure return.  Norlene Campbell, RN

## 2015-08-14 NOTE — Op Note (Signed)
[facility] Patient Name: Mason Houston Procedure Date: 08/14/2015 12:53 PM MRN: [id number] Account #: [id number] Date of Birth: 10-25-1950 Admit Type: Outpatient Age: 65 Room: [facility] ENDO ROOM 4 Gender: Male Note Status: Finalized Procedure:            ERCP Indications:          Stent removal Providers:            [name] MD, MD Referring MD:         No Local Md, MD (Referring MD) Medicines:            General Anesthesia Complications:        No immediate complications. Procedure:            Pre-Anesthesia Assessment:                       - Prior to the procedure, a History and Physical was                        performed, and patient medications and allergies were                        reviewed. The patient's tolerance of previous                        anesthesia was also reviewed. The risks and benefits of                        the procedure and the sedation options and risks were                        discussed with the patient. All questions were                        answered, and informed consent was obtained. Prior                        Anticoagulants: The patient has taken anticoagulant                        medication, last dose was 3 days prior to procedure.                        ASA Grade Assessment: II - A patient with mild systemic                        disease. After reviewing the risks and benefits, the                        patient was deemed in satisfactory condition to undergo                        the procedure.                       After obtaining informed consent, the scope was passed                        under direct vision. Throughout the procedure, the  patient's blood pressure, pulse, and oxygen saturations                        were monitored continuously. The Endosonoscope was                        introduced through the mouth, and used to inject   contrast into and used to inject contrast into the bile                        duct. The ERCP was accomplished without difficulty. The                        patient tolerated the procedure well. Findings:      A biliary stent was visible on the scout film. The major papilla was       enlarged. One stent was removed from the biliary tree using a snare. A       wire was passed into the biliary tree. The bile duct was deeply       cannulated with the short-nosed traction sphincterotome. Contrast was       injected. I personally interpreted the bile duct images. There was brisk       flow of contrast through the ducts. Image quality was excellent.       Contrast extended to the hepatic ducts. The lower third of the main bile       duct contained filling defect(s) thought to be sludge. The biliary tree       was swept with a 15 mm balloon starting at the bifurcation. Sludge was       swept from the duct. This was biopsied with a cold forceps for histology. Impression:           - The major papilla appeared to be enlarged. Ampula was                        biopsied.                       - The examination was suspicious for sludge.                       - One stent was removed from the biliary tree.                       - The biliary tree was swept and sludge was found. Recommendation:       - Watch for pancreatitis, bleeding, perforation, and                        cholangitis. Procedure Code(s):    --- Professional ---                       [phone], Endoscopic retrograde cholangiopancreatography                        (ERCP); with removal of foreign body(s) or stent(s)                        from biliary/pancreatic duct(s)  [id number], Endoscopic retrograde cholangiopancreatography                        (ERCP); with removal of calculi/debris from                        biliary/pancreatic duct(s)                       [id number], Endoscopic retrograde cholangiopancreatography                         (ERCP); with biopsy, single or multiple                       [id number], Endoscopic catheterization of the biliary ductal                        system, radiological supervision and interpretation Diagnosis Code(s):    --- Professional ---                       [id number], Encounter for fitting and adjustment of other                        gastrointestinal appliance and device                       K83.9, Disease of biliary tract, unspecified CPT copyright 2016 American Medical Association. All rights reserved. The codes documented in this report are preliminary and upon coder review may  be revised to meet current compliance requirements. [name] MD, MD 08/14/2015 1:26:01 PM This report has been signed electronically. Number of Addenda: 0 Note Initiated On: 08/14/2015 12:53 PM      [facility]

## 2015-08-14 NOTE — Anesthesia Procedure Notes (Signed)
Procedure Name: Intubation Date/Time: 08/14/2015 1:00 PM Performed by: Courtney Paris Pre-anesthesia Checklist: Patient identified, Emergency Drugs available, Suction available, Patient being monitored and Timeout performed Patient Re-evaluated:Patient Re-evaluated prior to inductionOxygen Delivery Method: Circle system utilized Preoxygenation: Pre-oxygenation with 100% oxygen Intubation Type: IV induction and Combination inhalational/ intravenous induction Ventilation: Mask ventilation without difficulty Laryngoscope Size: Miller and 3 Grade View: Grade III Tube type: Oral Tube size: 7.5 mm Number of attempts: 2 Airway Equipment and Method: Stylet Placement Confirmation: ETT inserted through vocal cords under direct vision,  positive ETCO2,  CO2 detector and breath sounds checked- equal and bilateral Secured at: 21 cm Tube secured with: Tape Dental Injury: Bloody posterior oropharynx

## 2015-08-14 NOTE — Anesthesia Preprocedure Evaluation (Signed)
Anesthesia Evaluation  Patient identified by MRN, date of birth, ID band Patient awake    Reviewed: Allergy & Precautions, NPO status , Patient's Chart, lab work & pertinent test results, reviewed documented beta blocker date and time   History of Anesthesia Complications Negative for: history of anesthetic complications  Airway Mallampati: III  TM Distance: <3 FB Neck ROM: limited    Dental  (+) Poor Dentition, Missing, Upper Dentures, Lower Dentures   Pulmonary neg shortness of breath, sleep apnea and Continuous Positive Airway Pressure Ventilation , pneumonia, resolved, neg COPD, neg recent URI, former smoker,           Cardiovascular Exercise Tolerance: Good hypertension, Pt. on medications (-) angina(-) CAD, (-) Past MI, (-) Cardiac Stents and (-) CABG (-) dysrhythmias + Valvular Problems/Murmurs (as a child)      Neuro/Psych PSYCHIATRIC DISORDERS Depression  Neuromuscular disease CVA    GI/Hepatic Neg liver ROS, GERD  Medicated and Controlled,  Endo/Other  neg diabetesMorbid obesity  Renal/GU negative Renal ROS  negative genitourinary   Musculoskeletal  (+) Arthritis , Fibromyalgia -  Abdominal   Peds  Hematology negative hematology ROS (+)   Anesthesia Other Findings Past Medical History: No date: Atrial fibrillation (HCC) No date: Cancer ([place]) No date: Collagen vascular disease (HCC) No date: Fibromyalgia No date: GERD (gastroesophageal reflux disease) No date: H/O Bell's palsy No date: History of [place] measles No date: Hypertension No date: Mini stroke ([place]) No date: Osteoarthrosis, unspecified whether generalize* 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 No date: Sleep apnea No date: Stroke ([place])  BMI    Body Mass Index:  39.78 kg/m      Reproductive/Obstetrics                             Anesthesia Physical  Anesthesia Plan  ASA: III  Anesthesia Plan: General ETT    Post-op Pain Management:    Induction: Intravenous  Airway Management Planned: Oral ETT  Additional Equipment:   Intra-op Plan:   Post-operative Plan:   Informed Consent: I have reviewed the patients History and Physical, chart, labs and discussed the procedure including the risks, benefits and alternatives for the proposed anesthesia with the patient or authorized representative who has indicated his/her understanding and acceptance.   Dental Advisory Given  Plan Discussed with: CRNA, Anesthesiologist and Surgeon  Anesthesia Plan Comments:         Anesthesia Quick Evaluation

## 2015-08-14 NOTE — Transfer of Care (Signed)
Immediate Anesthesia Transfer of Care Note  Patient: Mason Houston  Procedure(s) Performed: Procedure(s): ENDOSCOPIC RETROGRADE CHOLANGIOPANCREATOGRAPHY (ERCP) Stent removal (N/A)  Patient Location: PACU  Anesthesia Type:General  Level of Consciousness: oriented and patient cooperative  Airway & Oxygen Therapy: Patient Spontanous Breathing and Patient connected to face mask oxygen  Post-op Assessment: Report given to RN and Post -op Vital signs reviewed and stable  Post vital signs: Reviewed and stable  Last Vitals:  Vitals:   08/14/15 1044 08/14/15 1345  BP: 134/75 129/85  Pulse: 65 89  Resp: 16   Temp: 36.3 C 37.3 C    Last Pain:  Vitals:   08/14/15 1044  TempSrc: Tympanic         Complications: No apparent anesthesia complications

## 2015-08-15 ENCOUNTER — Other Ambulatory Visit: Payer: Self-pay | Admitting: Internal Medicine

## 2015-08-15 ENCOUNTER — Encounter: Payer: Self-pay | Admitting: Gastroenterology

## 2015-08-15 ENCOUNTER — Telehealth: Payer: Self-pay | Admitting: *Deleted

## 2015-08-15 DIAGNOSIS — C8330 Diffuse large B-cell lymphoma, unspecified site: Secondary | ICD-10-CM

## 2015-08-15 LAB — SURGICAL PATHOLOGY

## 2015-08-15 NOTE — Anesthesia Postprocedure Evaluation (Signed)
Anesthesia Post Note  Patient: Mason Houston  Procedure(s) Performed: Procedure(s) (LRB): ENDOSCOPIC RETROGRADE CHOLANGIOPANCREATOGRAPHY (ERCP) Stent removal (N/A)  Patient location during evaluation: PACU Anesthesia Type: General Level of consciousness: awake and alert Pain management: pain level controlled Vital Signs Assessment: post-procedure vital signs reviewed and stable Respiratory status: spontaneous breathing, nonlabored ventilation, respiratory function stable and patient connected to nasal cannula oxygen Cardiovascular status: blood pressure returned to baseline and stable Postop Assessment: no signs of nausea or vomiting Anesthetic complications: no    Last Vitals:  Vitals:   08/14/15 1445 08/14/15 1455  BP: 125/67 110/60  Pulse: 62 66  Resp: 16 16  Temp:      Last Pain:  Vitals:   08/14/15 1425  TempSrc: Tympanic  PainSc:                  Precious Haws [place]it[place]

## 2015-08-15 NOTE — Telephone Encounter (Signed)
Patient called to notify Dr. B that his stint was removed on 08/14/2015. He stated the Dr. B was going to schedule a PET 3 months after stint removal. He also stated that a biopsy was performed during stint removal.

## 2015-08-16 NOTE — Telephone Encounter (Signed)
Pet scan ordered by Dr. Jacinto Reap. msg sent to sch. To arrange

## 2015-08-23 ENCOUNTER — Other Ambulatory Visit: Payer: Self-pay | Admitting: Internal Medicine

## 2015-08-30 ENCOUNTER — Ambulatory Visit (INDEPENDENT_AMBULATORY_CARE_PROVIDER_SITE_OTHER): Payer: 59 | Admitting: Cardiovascular Disease

## 2015-08-30 ENCOUNTER — Telehealth: Payer: Self-pay

## 2015-08-30 ENCOUNTER — Encounter: Payer: Self-pay | Admitting: Cardiovascular Disease

## 2015-08-30 ENCOUNTER — Encounter (INDEPENDENT_AMBULATORY_CARE_PROVIDER_SITE_OTHER): Payer: Self-pay

## 2015-08-30 VITALS — BP 114/64 | HR 68 | Ht 67.0 in | Wt 252.5 lb

## 2015-08-30 DIAGNOSIS — I480 Paroxysmal atrial fibrillation: Secondary | ICD-10-CM | POA: Diagnosis not present

## 2015-08-30 DIAGNOSIS — I10 Essential (primary) hypertension: Secondary | ICD-10-CM

## 2015-08-30 NOTE — Progress Notes (Signed)
Cardiology Office Note   Date:  08/30/2015   ID:  Mason Houston, DOB Feb 28, 1950, MRN [id number]  PCP:  Mason Rake, MD  Cardiologist:   Mason Sacramento, MD   Chief Complaint  Patient presents with  . Other    No complaints today. Meds reviewed verbally with pt.      History of Present Illness: Mason Houston is a 65 y.o. male who presents for a followup visit regarding paroxysmal atrial fibrillation.  The patient has a long history of well-controlled hypertension and obesity. He was diagnosed with atrial fibrillation with rapid ventricular response during a routine physical  in December of 2013 in the setting of heavy caffeine intake. He also has sleep apnea currently effectively treated with CPAP.  He had a stroke in August 2015.  He was diagnosed with non-Hodgkin's lymphoma this year and was treated successfully with chemotherapy. He had problems during last visit with generalized edema and weight gain thought to be due to prednisone and possible reaction to Bactrim. He required short-term furosemide. Echocardiogram showed normal LV systolic function. He has been doing very well since then with no chest pain, shortness of breath or orthopnea. He gets swelling in his legs only at the end of the day.   Past Medical History:  Diagnosis Date  . Atrial fibrillation ([place])   . Cancer ([place])   . Collagen vascular disease ([place])   . Fibromyalgia   . GERD (gastroesophageal reflux disease)   . H/O Bell's palsy   . History of [place] measles   . Hypertension   . Mini stroke ([place])   . Osteoarthrosis, unspecified whether generalized or localized, lower leg   . Sixth cranial nerve palsy   . Sleep apnea   . Stroke [facility])     Past Surgical History:  Procedure Laterality Date  . CYST EXCISION    . ERCP N/A 12/29/2014   Procedure: ENDOSCOPIC RETROGRADE CHOLANGIOPANCREATOGRAPHY (ERCP);  Surgeon: Hulen Luster, MD;  Location: [facility] ENDOSCOPY;  Service: Endoscopy;  Laterality: N/A;  . ERCP N/A  02/21/2015   Procedure: ENDOSCOPIC RETROGRADE CHOLANGIOPANCREATOGRAPHY (ERCP);  Surgeon: Hulen Luster, MD;  [facility] ENDOSCOPY;  Service: Gastroenterology;  Laterality: N/A;  . ERCP N/A 08/14/2015   Procedure: ENDOSCOPIC RETROGRADE CHOLANGIOPANCREATOGRAPHY (ERCP) Stent removal;  Surgeon: [name], MD;  Location: [facility] ENDOSCOPY;  Service: Endoscopy;  Laterality: N/A;  . PERIPHERAL VASCULAR CATHETERIZATION N/A 01/31/2015   Procedure: Glori Luis Cath Insertion;  Surgeon: Katha Cabal, MD;  Location: [facility] LAB;  Service: Cardiovascular;  Laterality: N/A;  . TRIGGER FINGER RELEASE       Current Outpatient Prescriptions  Medication Sig Dispense Refill  . acetaminophen (TYLENOL) 500 MG tablet Take 1,000 mg by mouth every 6 (six) hours as needed for mild pain, fever or headache.     . b complex vitamins capsule Take 1 capsule by mouth at bedtime.    Marland Kitchen diltiazem (TIAZAC) 360 MG 24 hr capsule TAKE 1 CAPSULE BY MOUTH EVERY DAY 30 capsule 3  . lidocaine-prilocaine (EMLA) cream Apply 1 application topically as needed (prior to accessing port). Reported on 06/29/2015    . loratadine (CLARITIN) 10 MG tablet Take 10 mg by mouth daily as needed for allergies.     . magnesium oxide (MAG-OX) 400 (241.3 Mg) MG tablet Take 400 mg by mouth at bedtime.     . metoprolol tartrate (LOPRESSOR) 25 MG tablet Take 1 tablet (25 mg total) by mouth 2 (two) times daily. 60 tablet 3  .  omeprazole (PRILOSEC) 20 MG capsule TAKE 1 CAPSULE(20 MG) BY MOUTH DAILY 30 capsule 0  . rivaroxaban (XARELTO) 20 MG TABS tablet Take 1 tablet (20 mg total) by mouth daily with supper. 30 tablet 3  . Vitamin D, Ergocalciferol, (DRISDOL) 50000 units CAPS capsule Take 1 capsule (50,000 Units total) by mouth once a week. For 12 weeks 12 capsule 0   No current facility-administered medications for this visit.     Allergies:   Celebrex [celecoxib] and Penicillins    Social History:  The patient  reports that he has quit smoking. His  smoking use included Cigarettes. He has a 67.50 pack-year smoking history. He has never used smokeless tobacco. He reports that he uses drugs, including Marijuana. He reports that he does not drink alcohol.   Family History:  The patient's family history includes Hypertension in his mother.    ROS:  Please see the history of present illness.   Otherwise, review of systems are positive for none.   All other systems are reviewed and negative.    PHYSICAL EXAM: VS:  BP 114/64 (BP Location: Left Arm, Patient Position: Sitting, Cuff Size: Large)   Pulse 68   Ht 5\' 7"  (1.702 m)   Wt 252 lb 8 oz (114.5 kg)   BMI 39.55 kg/m  , BMI Body mass index is 39.55 kg/m. GEN: Well nourished, well developed, in no acute distress  HEENT: normal  Neck: no JVD, carotid bruits, or masses Cardiac: RRR; no murmurs, rubs, or gallops, trace leg edema  Respiratory:  clear to auscultation bilaterally, normal work of breathing GI: soft, nontender, nondistended, + BS MS: no deformity or atrophy  Skin: warm and dry, no rash Neuro:  Strength and sensation are intact Psych: euthymic mood, full affect   EKG:  EKG is ordered today. The ekg ordered today demonstrates normal sinus rhythm with no significant ST or T wave changes. Heart rate is 68  bpm.   Recent Labs: 06/26/2015: B Natriuretic Peptide 30.0 07/03/2015: TSH 1.130 08/06/2015: ALT 26; BUN 21; Creatinine, Ser 1.00; Hemoglobin 13.5; Platelets 400; Potassium 4.4; Sodium 135    Lipid Panel    Component Value Date/Time   CHOL 181 07/03/2015 1007   TRIG 74 07/03/2015 1007   HDL 44 07/03/2015 1007   CHOLHDL 4.1 07/03/2015 1007   LDLCALC 122 (H) 07/03/2015 1007      Wt Readings from Last 3 Encounters:  08/30/15 252 lb 8 oz (114.5 kg)  08/14/15 254 lb (115.2 kg)  08/06/15 254 lb 8 oz (115.4 kg)        ASSESSMENT AND PLAN:  1.  Paroxysmal atrial fibrillation: Currently maintaining in sinus rhythm. Continue diltiazem and metoprolol.  I reviewed  his recent labs which were unremarkable including normal CBC and renal function. Continue anticoagulation with Xarelto.  2. Essential hypertension: Blood pressure is controlled on current medications.  3. Chronic leg edema: This has improved significantly after stopping prednisone. Diltiazem might be contributing. If this worsens in the future, we can consider increasing the dose of diltiazem and increasing metoprolol.  Disposition:   FU with me in 6 months  Signed,  Mason Sacramento, MD  08/30/2015 9:02 AM    [place]

## 2015-08-30 NOTE — Patient Instructions (Signed)
Medication Instructions: Continue same medications.   Labwork: None.   Procedures/Testing: None.   Follow-Up: 6 months with Dr. Jerline Linzy.   Any Additional Special Instructions Will Be Listed Below (If Applicable).     If you need a refill on your cardiac medications before your next appointment, please call your pharmacy.   

## 2015-08-30 NOTE — Telephone Encounter (Signed)
Pt notified of ERCP results.

## 2015-08-30 NOTE — Telephone Encounter (Signed)
-----   Message from [name], MD sent at 08/25/2015  5:07 PM EDT ----- Let the patient know that the biopsy of the ampulla showed just ulcerations with inflammation but no sign of cancerous or precancerous tissue.

## 2015-09-17 ENCOUNTER — Inpatient Hospital Stay: Payer: 59

## 2015-09-18 ENCOUNTER — Inpatient Hospital Stay: Payer: 59

## 2015-09-18 ENCOUNTER — Inpatient Hospital Stay: Payer: 59 | Attending: Internal Medicine

## 2015-09-18 DIAGNOSIS — Z452 Encounter for adjustment and management of vascular access device: Secondary | ICD-10-CM | POA: Diagnosis not present

## 2015-09-18 DIAGNOSIS — Z95828 Presence of other vascular implants and grafts: Secondary | ICD-10-CM

## 2015-09-18 DIAGNOSIS — Z7901 Long term (current) use of anticoagulants: Secondary | ICD-10-CM | POA: Insufficient documentation

## 2015-09-18 DIAGNOSIS — I4891 Unspecified atrial fibrillation: Secondary | ICD-10-CM | POA: Diagnosis not present

## 2015-09-18 DIAGNOSIS — C8339 Diffuse large B-cell lymphoma, extranodal and solid organ sites: Secondary | ICD-10-CM | POA: Insufficient documentation

## 2015-09-18 MED ORDER — SODIUM CHLORIDE 0.9% FLUSH
10.0000 mL | INTRAVENOUS | Status: DC | PRN
  Administered 2015-09-18: 10 mL via INTRAVENOUS
  Filled 2015-09-18: qty 10

## 2015-09-18 MED ORDER — HEPARIN SOD (PORK) LOCK FLUSH 100 UNIT/ML IV SOLN
500.0000 [IU] | Freq: Once | INTRAVENOUS | Status: AC
  Administered 2015-09-18: 500 [IU] via INTRAVENOUS

## 2015-09-18 NOTE — Progress Notes (Signed)
Survivorship Care Plan visit completed.  Treatment summary reviewed and given to patient.  ASCO answers booklet reviewed and given to patient.  CARE program and Cancer Transitions discussed with patient along with other resources cancer center offers to patients and caregivers.  Patient verbalized understanding.    

## 2015-09-20 ENCOUNTER — Other Ambulatory Visit: Payer: Self-pay | Admitting: Internal Medicine

## 2015-10-01 ENCOUNTER — Other Ambulatory Visit: Payer: Self-pay | Admitting: Family Medicine

## 2015-10-03 ENCOUNTER — Telehealth: Payer: Self-pay | Admitting: *Deleted

## 2015-10-03 ENCOUNTER — Other Ambulatory Visit: Payer: Self-pay | Admitting: *Deleted

## 2015-10-03 DIAGNOSIS — C8330 Diffuse large B-cell lymphoma, unspecified site: Secondary | ICD-10-CM

## 2015-10-03 NOTE — Telephone Encounter (Signed)
Patient contacted Dr. Aletha Halim nurse to request amb referral to dietary.   This request order was entered.

## 2015-10-04 ENCOUNTER — Other Ambulatory Visit: Payer: Self-pay | Admitting: Cardiovascular Disease

## 2015-10-05 ENCOUNTER — Encounter: Payer: 59 | Attending: Internal Medicine | Admitting: Dietician

## 2015-10-05 VITALS — Ht 67.0 in | Wt 258.9 lb

## 2015-10-05 DIAGNOSIS — E669 Obesity, unspecified: Secondary | ICD-10-CM

## 2015-10-05 NOTE — Patient Instructions (Signed)
   Track your food intake by keeping a diary of everything you eat. Record what you eat, and the amounts.   Keep up your healthy food choices and portion control!   Continue with regular exercise, think of some exercise you can do during winter season.

## 2015-10-05 NOTE — Progress Notes (Signed)
Medical Nutrition Therapy: Visit start time: 0930  end time: 1115  Assessment:  Diagnosis: obesity  Past medical history: non hodgkin's lymphoma Psychosocial issues/ stress concerns: none Preferred learning method:  . Auditory . Visual . Hands-on . No preference indicated  Current weight: 258.9lbs  Height: 5'7" Medications, supplements: reviewed list in chart with patient Progress and evaluation: Patient reports weight loss of about 40lbs last year when following "keto" diet, which was very low carb. He has not lost any additional weight during cancer treatments, but now that he is finished with treatments, wants to resume weight loss. He has been advised to improve the health value of his diet by resuming fruit intake and avoiding processed meats, which he has done, but has gained several pounds. He also was skipping meals frequently, but is now eating 3 meals daily. He seeks help in resuming weight loss. He reports being inactive for several months during chemo treatment and recent bout of pneumonia, requiring treatment with steroid meds.    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  Dietary Intake:  Usual eating pattern includes 3 meals and 1-2 snacks per day. Dining out frequency: 0-1 meals per week.  Breakfast: oatmeal with fruit Snack: sometimes fruit Lunch: sandwich, or vegetables and lean meat Snack: none or 100-cal pkg popcorn Supper: chicken or fish, hamburger patty once a week, + 2-3 cups vegetables either broccoli blend, or corn blend.  Snack: sugar free popsicle Beverages: 2-3c. Black coffee, water  Nutrition Care Education: Topics covered: weight management, heart healthy diet Basic nutrition: basic food groups, appropriate nutrient balance, appropriate meal and snack schedule, general nutrition guidelines    Weight control: 1500kcal daily intake with 10-11 servings of carbohydrate foods  (45%), 8-9oz protein (25%), and high intake of vegetables and fruits. Discussed food portions and importance of low fat and low sugar choices, which patient is already making. Discussed effects of basal metabolism, and importance of ongoing regular exercise to boost metabolism.  Hypertension: DASH diet guidelines as overall healthy diet; identifying high sodium foods, identifying food sources of Calcium, potassium, magnesium Hyperlipidemia: healthy and unhealthy fats, role of fiber, food sources of folate, phytochemicals Other lifestyle changes:  benefits of making changes, increasing motivation, readiness for change, identifying habits that need to change, alcohol use, food and drug interactions  Nutritional Diagnosis:  [place]-3.3 Overweight/obesity As related to history of excess calories, inactivity.  As evidenced by patient report.  Intervention: Instruction as noted above.   Set goals with patient input.    He will keep a food diary for 2-4 weeks, and schedule follow-up later if needed.   Education Materials given:  Marland Kitchen DASH diet guidelines . Food lists/ Planning A Balanced Meal . Goals/ instructions  Learner/ who was taught:  . Patient   Level of understanding: Marland Kitchen Verbalizes/ demonstrates competency  Demonstrated degree of understanding via:   Teach back Learning barriers: . None  Willingness to learn/ readiness for change: . Eager, change in progress  Monitoring and Evaluation:  Dietary intake, exercise, and body weight      follow up: prn

## 2015-10-08 ENCOUNTER — Inpatient Hospital Stay: Payer: 59

## 2015-10-17 ENCOUNTER — Telehealth: Payer: Self-pay | Admitting: Family Medicine

## 2015-10-17 NOTE — Telephone Encounter (Signed)
patient has appointment for next Thursday (10/25/15) he was told to call a week before his appointment to request a lab order for his cholesterol (that way the results will be here by his appointment date). It is okay to leave detailed message on answering machine

## 2015-10-18 NOTE — Telephone Encounter (Signed)
That is not necessary, we will order labs after the office visit and we'll call him back with results.

## 2015-10-19 NOTE — Telephone Encounter (Signed)
Left voice message informing patient.

## 2015-10-25 ENCOUNTER — Encounter: Payer: Self-pay | Admitting: Family Medicine

## 2015-10-25 ENCOUNTER — Ambulatory Visit (INDEPENDENT_AMBULATORY_CARE_PROVIDER_SITE_OTHER): Payer: 59 | Admitting: Family Medicine

## 2015-10-25 VITALS — BP 116/63 | HR 70 | Temp 98.3°F | Resp 16 | Ht 67.0 in | Wt 255.2 lb

## 2015-10-25 DIAGNOSIS — I10 Essential (primary) hypertension: Secondary | ICD-10-CM | POA: Diagnosis not present

## 2015-10-25 DIAGNOSIS — R7303 Prediabetes: Secondary | ICD-10-CM | POA: Diagnosis not present

## 2015-10-25 DIAGNOSIS — E785 Hyperlipidemia, unspecified: Secondary | ICD-10-CM

## 2015-10-25 LAB — POCT GLYCOSYLATED HEMOGLOBIN (HGB A1C): Hemoglobin A1C: 6

## 2015-10-25 MED ORDER — ROSUVASTATIN CALCIUM 10 MG PO TABS: 10.0000 mg | ORAL_TABLET | Freq: Every day | ORAL | 1 refills | Status: DC

## 2015-10-25 NOTE — Progress Notes (Signed)
Name: Mason Houston   MRN: [id number]    DOB: 10/25/1950   Date:10/25/2015       Progress Note  Subjective  Chief Complaint  Chief Complaint  Patient presents with  . Follow-up    3 mo  . Labs Only    Fasting    Hyperlipidemia  This is a chronic problem. The problem is uncontrolled. Recent lipid tests were reviewed and are high. Pertinent negatives include no chest pain, myalgias or shortness of breath. He is currently on no antihyperlipidemic treatment.    Past Medical History:  Diagnosis Date  . Atrial fibrillation ([place])   . Cancer ([place])   . Collagen vascular disease ([place])   . Fibromyalgia   . GERD (gastroesophageal reflux disease)   . H/O Bell's palsy   . History of [place] measles   . Hypertension   . Mini stroke ([place])   . Osteoarthrosis, unspecified whether generalized or localized, lower leg   . Sixth cranial nerve palsy   . Sleep apnea   . Stroke [facility])     Past Surgical History:  Procedure Laterality Date  . CYST EXCISION    . ERCP N/A 12/29/2014   Procedure: ENDOSCOPIC RETROGRADE CHOLANGIOPANCREATOGRAPHY (ERCP);  Surgeon: Hulen Luster, MD;  Location: [facility] ENDOSCOPY;  Service: Endoscopy;  Laterality: N/A;  . ERCP N/A 02/21/2015   Procedure: ENDOSCOPIC RETROGRADE CHOLANGIOPANCREATOGRAPHY (ERCP);  Surgeon: Hulen Luster, MD;  Location: [facility] ENDOSCOPY;  Service: Gastroenterology;  Laterality: N/A;  . ERCP N/A 08/14/2015   Procedure: ENDOSCOPIC RETROGRADE CHOLANGIOPANCREATOGRAPHY (ERCP) Stent removal;  Surgeon: [name], MD;  Location: [facility] ENDOSCOPY;  Service: Endoscopy;  Laterality: N/A;  . PERIPHERAL VASCULAR CATHETERIZATION N/A 01/31/2015   Procedure: Glori Luis Cath Insertion;  Surgeon: Katha Cabal, MD;  Location: [place] CV LAB;  Service: Cardiovascular;  Laterality: N/A;  . TRIGGER FINGER RELEASE      Family History  Problem Relation Age of Onset  . Hypertension Mother     Social History   Social History  . Marital status: Married    Spouse name:  N/A  . Number of children: N/A  . Years of education: N/A   Occupational History  . Not on file.   Social History Main Topics  . Smoking status: Former Smoker    Packs/day: 4.50    Years: 15.00    Types: Cigarettes  . Smokeless tobacco: Never Used  . Alcohol use No  . Drug use:     Types: Marijuana     Comment: PAST  . Sexual activity: Not on file   Other Topics Concern  . Not on file   Social History Narrative  . No narrative on file     Current Outpatient Prescriptions:  .  acetaminophen (TYLENOL) 500 MG tablet, Take 1,000 mg by mouth every 6 (six) hours as needed for mild pain, fever or headache. , Disp: , Rfl:  .  b complex vitamins capsule, Take 1 capsule by mouth at bedtime., Disp: , Rfl:  .  diltiazem (TIAZAC) 360 MG 24 hr capsule, TAKE 1 CAPSULE BY MOUTH EVERY DAY, Disp: 30 capsule, Rfl: 3 .  lidocaine-prilocaine (EMLA) cream, Apply 1 application topically as needed (prior to accessing port). Reported on 06/29/2015, Disp: , Rfl:  .  loratadine (CLARITIN) 10 MG tablet, Take 10 mg by mouth daily as needed for allergies. , Disp: , Rfl:  .  magnesium oxide (MAG-OX) 400 (241.3 Mg) MG tablet, Take 400 mg by mouth at bedtime. , Disp: , Rfl:  .  metoprolol tartrate (LOPRESSOR) 25 MG tablet, TAKE 1 TABLET(25 MG) BY MOUTH TWICE DAILY, Disp: 60 tablet, Rfl: 3 .  omeprazole (PRILOSEC) 20 MG capsule, TAKE 1 CAPSULE(20 MG) BY MOUTH DAILY, Disp: 30 capsule, Rfl: 3 .  rivaroxaban (XARELTO) 20 MG TABS tablet, Take 1 tablet (20 mg total) by mouth daily with supper., Disp: 30 tablet, Rfl: 3 .  Vitamin D, Ergocalciferol, (DRISDOL) 50000 units CAPS capsule, Take 1 capsule (50,000 Units total) by mouth once a week. For 12 weeks, Disp: 12 capsule, Rfl: 0  Allergies  Allergen Reactions  . Celebrex [Celecoxib] Hives  . Penicillins Hives and Other (See Comments)    Has patient had a PCN reaction causing immediate rash, facial/tongue/throat swelling, SOB or lightheadedness with hypotension:  No Has patient had a PCN reaction causing severe rash involving mucus membranes or skin necrosis: No Has patient had a PCN reaction that required hospitalization No Has patient had a PCN reaction occurring within the last 10 years: No If all of the above answers are "NO", then may proceed with Cephalosporin use.     Review of Systems  Constitutional: Negative for chills, fever and malaise/fatigue.  Respiratory: Negative for cough and shortness of breath.   Cardiovascular: Negative for chest pain and palpitations.  Musculoskeletal: Negative for myalgias.    Objective  Vitals:   10/25/15 0855  BP: 116/63  Pulse: 70  Resp: 16  Temp: 98.3 F (36.8 C)  TempSrc: Oral  SpO2: 98%  Weight: 255 lb 3.2 oz (115.8 kg)  Height: 5\' 7"  (1.702 m)    Physical Exam  Constitutional: He is oriented to person, place, and time and well-developed, well-nourished, and in no distress.  HENT:  Head: Normocephalic and atraumatic.  Cardiovascular: Normal rate, regular rhythm and normal heart sounds.   No murmur heard. Pulmonary/Chest: Effort normal and breath sounds normal. He has no wheezes.  Abdominal: Soft. Bowel sounds are normal.  Neurological: He is alert and oriented to person, place, and time.  Psychiatric: Mood, memory, affect and judgment normal.  Nursing note and vitals reviewed.    Assessment & Plan  1. Benign essential HTN Now off losartan. Blood pressure is well controlled on metoprolol.  2. Hyperlipidemia LDL goal <100 Patient has changed his diet and eating more fruits and vegetables, avoiding fried foods. Start on Crestor 10 mg at bedtime because of history of stroke. - rosuvastatin (CRESTOR) 10 MG tablet; Take 1 tablet (10 mg total) by mouth daily.  Dispense: 90 tablet; Refill: 1  3. Prediabetes A1c is 6.0%, consistent with prediabetes. Advised to continue with physical activity and a balanced diet, recheck in 6 months - POCT HgB A1C    Jelisha Weed Asad A. [facility] 10/25/2015 9:04 AM

## 2015-10-30 ENCOUNTER — Encounter: Payer: Self-pay | Admitting: Family Medicine

## 2015-10-30 ENCOUNTER — Ambulatory Visit (INDEPENDENT_AMBULATORY_CARE_PROVIDER_SITE_OTHER): Payer: 59 | Admitting: Family Medicine

## 2015-10-30 DIAGNOSIS — J3489 Other specified disorders of nose and nasal sinuses: Secondary | ICD-10-CM | POA: Diagnosis not present

## 2015-10-30 NOTE — Progress Notes (Signed)
Name: Mason [place]   MRN: [id number]    DOB: 16-Jun-1950   Date:10/30/2015       Progress Note  Subjective  Chief Complaint  Chief Complaint  Patient presents with  . Nose Sores    nose sores and lesion for 2 months    HPI  Nasal Lesion: Has been ongoing for some time now, starts off in the outside nasal cavity, feels like scab then comes off after a few days and resolves. Lately he has noticed that the 'scab' travels from one nostril to another. He picks his nose often and his wife thinks that's what causing his scab to form.  No nasal bleeding, irritation, or allergic symptoms.    Past Medical History:  Diagnosis Date  . Atrial fibrillation ([place])   . Cancer ([place])   . Collagen vascular disease ([place])   . Fibromyalgia   . GERD (gastroesophageal reflux disease)   . H/O Bell's palsy   . History of [place] measles   . Hypertension   . Mini stroke ([place])   . Osteoarthrosis, unspecified whether generalized or localized, lower leg   . Sixth cranial nerve palsy   . Sleep apnea   . Stroke [facility])     Past Surgical History:  Procedure Laterality Date  . CYST EXCISION    . ERCP N/A 12/29/2014   Procedure: ENDOSCOPIC RETROGRADE CHOLANGIOPANCREATOGRAPHY (ERCP);  Surgeon: Hulen Luster, MD;  Location: [facility] ENDOSCOPY;  Service: Endoscopy;  Laterality: N/A;  . ERCP N/A 02/21/2015   Procedure: ENDOSCOPIC RETROGRADE CHOLANGIOPANCREATOGRAPHY (ERCP);  Surgeon: Hulen Luster, MD;  Location: [facility] ENDOSCOPY;  Service: Gastroenterology;  Laterality: N/A;  . ERCP N/A 08/14/2015   Procedure: ENDOSCOPIC RETROGRADE CHOLANGIOPANCREATOGRAPHY (ERCP) Stent removal;  Surgeon: [name], MD;  Location: [facility] ENDOSCOPY;  Service: Endoscopy;  Laterality: N/A;  . PERIPHERAL VASCULAR CATHETERIZATION N/A 01/31/2015   Procedure: Glori Luis Cath Insertion;  Surgeon: Katha Cabal, MD;  Location: [place] CV LAB;  Service: Cardiovascular;  Laterality: N/A;  . TRIGGER FINGER RELEASE      Family History  Problem  Relation Age of Onset  . Hypertension Mother     Social History   Social History  . Marital status: Married    Spouse name: N/A  . Number of children: N/A  . Years of education: N/A   Occupational History  . Not on file.   Social History Main Topics  . Smoking status: Former Smoker    Packs/day: 4.50    Years: 15.00    Types: Cigarettes  . Smokeless tobacco: Never Used  . Alcohol use No  . Drug use:     Types: Marijuana     Comment: PAST  . Sexual activity: Not on file   Other Topics Concern  . Not on file   Social History Narrative  . No narrative on file     Current Outpatient Prescriptions:  .  acetaminophen (TYLENOL) 500 MG tablet, Take 1,000 mg by mouth every 6 (six) hours as needed for mild pain, fever or headache. , Disp: , Rfl:  .  b complex vitamins capsule, Take 1 capsule by mouth at bedtime., Disp: , Rfl:  .  diltiazem (TIAZAC) 360 MG 24 hr capsule, TAKE 1 CAPSULE BY MOUTH EVERY DAY, Disp: 30 capsule, Rfl: 3 .  lidocaine-prilocaine (EMLA) cream, Apply 1 application topically as needed (prior to accessing port). Reported on 06/29/2015, Disp: , Rfl:  .  loratadine (CLARITIN) 10 MG tablet, Take 10 mg by mouth daily as needed for  allergies. , Disp: , Rfl:  .  magnesium oxide (MAG-OX) 400 (241.3 Mg) MG tablet, Take 400 mg by mouth at bedtime. , Disp: , Rfl:  .  metoprolol tartrate (LOPRESSOR) 25 MG tablet, TAKE 1 TABLET(25 MG) BY MOUTH TWICE DAILY, Disp: 60 tablet, Rfl: 3 .  omeprazole (PRILOSEC) 20 MG capsule, TAKE 1 CAPSULE(20 MG) BY MOUTH DAILY, Disp: 30 capsule, Rfl: 3 .  rivaroxaban (XARELTO) 20 MG TABS tablet, Take 1 tablet (20 mg total) by mouth daily with supper., Disp: 30 tablet, Rfl: 3 .  rosuvastatin (CRESTOR) 10 MG tablet, Take 1 tablet (10 mg total) by mouth daily., Disp: 90 tablet, Rfl: 1 .  Vitamin D, Ergocalciferol, (DRISDOL) 50000 units CAPS capsule, Take 1 capsule (50,000 Units total) by mouth once a week. For 12 weeks, Disp: 12 capsule, Rfl:  0  Allergies  Allergen Reactions  . Celebrex [Celecoxib] Hives  . Penicillins Hives and Other (See Comments)    Has patient had a PCN reaction causing immediate rash, facial/tongue/throat swelling, SOB or lightheadedness with hypotension: No Has patient had a PCN reaction causing severe rash involving mucus membranes or skin necrosis: No Has patient had a PCN reaction that required hospitalization No Has patient had a PCN reaction occurring within the last 10 years: No If all of the above answers are "NO", then may proceed with Cephalosporin use.     Review of Systems  Constitutional: Negative for chills, fever and malaise/fatigue.  HENT: Negative for congestion and nosebleeds.   Neurological: Negative for headaches.    Objective  Vitals:   10/30/15 1019  BP: 122/64  Pulse: 82  Resp: 16  Temp: 98.7 F (37.1 C)  TempSrc: Oral  SpO2: 95%  Weight: 255 lb (115.7 kg)  Height: 5\' 7"  (1.702 m)    Physical Exam  Constitutional: He is oriented to person, place, and time and well-developed, well-nourished, and in no distress.  HENT:  Nose: No rhinorrhea, nose lacerations, sinus tenderness or nasal deformity. No epistaxis.  No foreign bodies.  Enlarged tubinates, mild mucosal erythema, no FB, no laceration.   Neurological: He is alert and oriented to person, place, and time.  Psychiatric: Mood, memory, affect and judgment normal.  Nursing note and vitals reviewed.     Assessment & Plan  1. Internal nasal lesion Upon inspection of his nasal cavity, no concerning lesions were identified. Mildly enlarged turbinates with erythema. No evidence of bleeding or infection. Patient reassured. Advised to follow up with ENT if the symptoms recur   [facility] 10/30/2015 10:23 AM

## 2015-11-08 ENCOUNTER — Telehealth: Payer: Self-pay | Admitting: *Deleted

## 2015-11-08 NOTE — Telephone Encounter (Signed)
Has a "knot" in his groin which is tender. Started Monday night 4 inches below belly button on left. Has not done any heavy lifting, does not feel it is a hernia. It is a little hard, not mushy, it is not red or hot to the touch. He had been scratching the area earlier. Wife is concerned about area, Has no FU until end of month. Please advise

## 2015-11-08 NOTE — Telephone Encounter (Signed)
Advised to see PCP regarding this, he said his cancer does not present this way. Patient expressed understanding

## 2015-11-09 ENCOUNTER — Ambulatory Visit (INDEPENDENT_AMBULATORY_CARE_PROVIDER_SITE_OTHER): Payer: 59 | Admitting: Family Medicine

## 2015-11-09 ENCOUNTER — Encounter: Payer: Self-pay | Admitting: Family Medicine

## 2015-11-09 DIAGNOSIS — R229 Localized swelling, mass and lump, unspecified: Secondary | ICD-10-CM | POA: Diagnosis not present

## 2015-11-09 MED ORDER — CLINDAMYCIN HCL 300 MG PO CAPS: 300.0000 mg | ORAL_CAPSULE | Freq: Four times a day (QID) | ORAL | 0 refills | Status: DC

## 2015-11-09 NOTE — Progress Notes (Signed)
Name: Mason Houston   MRN: [id number]    DOB: Jul 14, 1950   Date:11/09/2015       Progress Note  Subjective  Chief Complaint  Chief Complaint  Patient presents with  . Hernia    possible hernia in groin area tender  . Follow-up    nasal sores    HPI  Knot in Left Groin: Pt. Presents for evaluation of [place] knot in his left groin, came on 4-5 days ago, he contacted his oncologist fearing that his cancer may have come back who advised him to seek consultation with PCP. He feels the knot is [place] little tender, no rash on it. He reports episodes of heavy lifting as he works in his yard.  Past Medical History:  Diagnosis Date  . Atrial fibrillation ([place])   . Cancer ([place])   . Collagen vascular disease ([place])   . Fibromyalgia   . GERD (gastroesophageal reflux disease)   . H/O Bell's palsy   . History of [place] measles   . Hypertension   . Mini stroke ([place])   . Osteoarthrosis, unspecified whether generalized or localized, lower leg   . Sixth cranial nerve palsy   . Sleep apnea   . Stroke [facility])     Past Surgical History:  Procedure Laterality Date  . CYST EXCISION    . ERCP N/[place] 12/29/2014   Procedure: ENDOSCOPIC RETROGRADE CHOLANGIOPANCREATOGRAPHY (ERCP);  Surgeon: Hulen Luster, MD;  Location: [facility] ENDOSCOPY;  Service: Endoscopy;  Laterality: N/[place];  . ERCP N/[place] 02/21/2015   Procedure: ENDOSCOPIC RETROGRADE CHOLANGIOPANCREATOGRAPHY (ERCP);  Surgeon: Hulen Luster, MD;  Location: [facility] ENDOSCOPY;  Service: Gastroenterology;  Laterality: N/[place];  . ERCP N/[place] 08/14/2015   Procedure: ENDOSCOPIC RETROGRADE CHOLANGIOPANCREATOGRAPHY (ERCP) Stent removal;  Surgeon: [name], MD;  Location: [facility] ENDOSCOPY;  Service: Endoscopy;  Laterality: N/[place];  . PERIPHERAL VASCULAR CATHETERIZATION N/[place] 01/31/2015   Procedure: Glori Luis Cath Insertion;  Surgeon: Katha Cabal, MD;  Location: [place] CV LAB;  Service: Cardiovascular;  Laterality: N/[place];  . TRIGGER FINGER RELEASE      Family History  Problem Relation Age  of Onset  . Hypertension Mother     Social History   Social History  . Marital status: Married    Spouse name: N/Houston  . Number of children: N/Houston  . Years of education: N/[place]   Occupational History  . Not on file.   Social History Main Topics  . Smoking status: Former Smoker    Packs/day: 4.50    Years: 15.00    Types: Cigarettes  . Smokeless tobacco: Never Used  . Alcohol use No  . Drug use:     Types: Marijuana     Comment: PAST  . Sexual activity: Not on file   Other Topics Concern  . Not on file   Social History Narrative  . No narrative on file     Current Outpatient Prescriptions:  .  acetaminophen (TYLENOL) 500 MG tablet, Take 1,000 mg by mouth every 6 (six) hours as needed for mild pain, fever or headache. , Disp: , Rfl:  .  b complex vitamins capsule, Take 1 capsule by mouth at bedtime., Disp: , Rfl:  .  diltiazem (TIAZAC) 360 MG 24 hr capsule, TAKE 1 CAPSULE BY MOUTH EVERY DAY, Disp: 30 capsule, Rfl: 3 .  lidocaine-prilocaine (EMLA) cream, Apply 1 application topically as needed (prior to accessing port). Reported on 06/29/2015, Disp: , Rfl:  .  loratadine (CLARITIN) 10 MG tablet, Take 10 mg by mouth daily  as needed for allergies. , Disp: , Rfl:  .  magnesium oxide (MAG-OX) 400 (241.3 Mg) MG tablet, Take 400 mg by mouth at bedtime. , Disp: , Rfl:  .  metoprolol tartrate (LOPRESSOR) 25 MG tablet, TAKE 1 TABLET(25 MG) BY MOUTH TWICE DAILY, Disp: 60 tablet, Rfl: 3 .  omeprazole (PRILOSEC) 20 MG capsule, TAKE 1 CAPSULE(20 MG) BY MOUTH DAILY, Disp: 30 capsule, Rfl: 3 .  rivaroxaban (XARELTO) 20 MG TABS tablet, Take 1 tablet (20 mg total) by mouth daily with supper., Disp: 30 tablet, Rfl: 3 .  rosuvastatin (CRESTOR) 10 MG tablet, Take 1 tablet (10 mg total) by mouth daily., Disp: 90 tablet, Rfl: 1 .  Vitamin D, Ergocalciferol, (DRISDOL) 50000 units CAPS capsule, Take 1 capsule (50,000 Units total) by mouth once [place] week. For 12 weeks, Disp: 12 capsule, Rfl: 0  Allergies    Allergen Reactions  . Celebrex [Celecoxib] Hives  . Penicillins Hives and Other (See Comments)    Has patient had [place] PCN reaction causing immediate rash, facial/tongue/throat swelling, SOB or lightheadedness with hypotension: No Has patient had [place] PCN reaction causing severe rash involving mucus membranes or skin necrosis: No Has patient had [place] PCN reaction that required hospitalization No Has patient had [place] PCN reaction occurring within the last 10 years: No If all of the above answers are "NO", then may proceed with Cephalosporin use.     Review of Systems  Constitutional: Negative for chills, fever and malaise/fatigue.  Gastrointestinal: Negative for abdominal pain.    Objective  Vitals:   11/09/15 0837  BP: 118/70  Pulse: 67  Resp: 18  Temp: 98.2 F (36.8 C)  TempSrc: Oral  SpO2: 96%  Weight: 256 lb (116.1 kg)  Height: 5\' 7"  (1.702 m)    Physical Exam  Constitutional: He is oriented to person, place, and time and well-developed, well-nourished, and in no distress.  Abdominal:    Raised indurated erythematous and tender mass over the left supra-pubic area, overlying skin rash.  Neurological: He is alert and oriented to person, place, and time.  Psychiatric: Mood, memory, affect and judgment normal.  Nursing note and vitals reviewed.    Assessment & Plan  1. Localized skin mass, lump, or swelling Localized suprapubic tender mass with overlying erythema consistent with infection. Start on clindamycin for 7 days, obtain ultrasound to evaluate the nature of the mass - clindamycin (CLEOCIN) 300 MG capsule; Take 1 capsule (300 mg total) by mouth 4 (four) times daily.  Dispense: 28 capsule; Refill: 0 - [place] Misc Soft Tissue; Future - [place] Extrem Low Left Ltd; Future - CBC with Differential   Mason [facility]. [facility] 11/09/2015 8:47 AM

## 2015-11-10 ENCOUNTER — Encounter: Payer: Self-pay | Admitting: Gynecology

## 2015-11-10 ENCOUNTER — Ambulatory Visit
Admission: EM | Admit: 2015-11-10 | Discharge: 2015-11-10 | Disposition: A | Discharge status: Home / Self Care | Payer: 59 | Attending: Family Medicine | Admitting: Family Medicine

## 2015-11-10 DIAGNOSIS — L03314 Cellulitis of groin: Secondary | ICD-10-CM

## 2015-11-10 NOTE — ED Triage Notes (Signed)
Per patient seen by his PCP x yesterday for his testicular mass , Ultrasound schedule for Monday. Per patient was given antibiotic and believe he is having  reaction to the antibiotic. Now with swelling /redness at testicle.

## 2015-11-10 NOTE — ED Provider Notes (Signed)
[facility]    CSN: [id number] Arrival date & time: 11/10/15  1522     History   Chief Complaint Chief Complaint  Patient presents with  . Testicular Mass    HPI Mason Houston is a 65 y.o. male.   65 yo male with a c/o redness and swelling to groin suprapubic area. States saw PCP yesterday and started on antibiotic, took 2 doses but states he thought last night there was more swelling to the area and he wasn't sure if this was a reaction to the antibiotic. He hasn't taken any of the antibiotic today. Denies fevers, chills, dysuria, injury, penile discharge,  testicular/scrotal pain, redness or swelling.    The history is provided by the patient.    Past Medical History:  Diagnosis Date  . Atrial fibrillation ([place])   . Cancer ([place])   . Collagen vascular disease ([place])   . Fibromyalgia   . GERD (gastroesophageal reflux disease)   . H/O Bell's palsy   . History of [place] measles   . Hypertension   . Mini stroke ([place])   . Osteoarthrosis, unspecified whether generalized or localized, lower leg   . Sixth cranial nerve palsy   . Sleep apnea   . Stroke [facility])     Patient Active Problem List   Diagnosis Date Noted  . Localized skin mass, lump, or swelling 11/09/2015  . Internal nasal lesion 10/30/2015  . Fitting and adjustment of gastrointestinal appliance and device   . Disease of biliary tract   . History of stroke 07/25/2015  . Annual physical exam 07/03/2015  . Pneumonia 05/15/2015  . Fever 05/15/2015  . Hypoxia 05/07/2015  . 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 Pancreatic mass 01/12/2015  . Obstructive jaundice 12/28/2014  . Arthritis 06/09/2014  . Benign essential HTN 10/04/2013  . Cerebellar infarction ([place]) 10/04/2013  . Acid reflux 10/04/2013  . Arthritis, degenerative 10/04/2013  . HLD (hyperlipidemia) 10/04/2013  . H/O disease 10/04/2013  . Apnea, sleep 10/04/2013  . Type 2 diabetes mellitus ([place]) 10/04/2013  . Stroke  ([place]) 09/08/2013  . Obstructive sleep apnea 02/03/2013  . Depression 07/05/2012  . Major depressive disorder with single episode 07/05/2012  . Hyperlipidemia LDL goal <100 12/23/2011  . Atrial fibrillation ([place])   . Hypertension     Past Surgical History:  Procedure Laterality Date  . CYST EXCISION    . ERCP N/A 12/29/2014   Procedure: ENDOSCOPIC RETROGRADE CHOLANGIOPANCREATOGRAPHY (ERCP);  Surgeon: Hulen Luster, MD;  [facility] END[facility];  Service: Endoscopy;  Laterality: N/A;  . ERCP N/A 02/21/2015   Procedure: ENDOSCOPIC RETROGRADE CHOLANGIOPANCREATOGRAPHY (ERCP);  Surgeon: Hulen Luster, MD;  Location: [facility] ENDOSCOPY;  Service: Gastroenterology;  Laterality: N/A;  . ERCP N/A 08/14/2015   Procedure: ENDOSCOPIC RETROGRADE CHOLANGIOPANCREATOGRAPHY (ERCP) Stent removal;  Surgeon: [name], MD;  Location: [facility] ENDOSCOPY;  Service: Endoscopy;  Laterality: N/A;  . PERIPHERAL VASCULAR CATHETERIZATION N/A 01/31/2015   Procedure: Glori Luis Cath Insertion;  Surgeon: Katha Cabal, MD;  Location: [facility] CV LAB;  Service: Cardiovascular;  Laterality: N/A;  . TRIGGER FINGER RELEASE         Home Medications    Prior to Admission medications   Medication Sig Start Date End Date Taking? Authorizing Provider  acetaminophen (TYLENOL) 500 MG tablet Take 1,000 mg by mouth every 6 (six) hours as needed for mild pain, fever or headache.    Yes Historical Provider, MD  b complex vitamins capsule Take 1 capsule by  mouth at bedtime.   Yes Historical Provider, MD  diltiazem (TIAZAC) 360 MG 24 hr capsule TAKE 1 CAPSULE BY MOUTH EVERY DAY 07/05/15  Yes Wellington Hampshire, MD  lidocaine-prilocaine (EMLA) cream Apply 1 application topically as needed (prior to accessing port). Reported on 06/29/2015   Yes Historical Provider, MD  loratadine (CLARITIN) 10 MG tablet Take 10 mg by mouth daily as needed for allergies.    Yes Historical Provider, MD  magnesium oxide (MAG-OX) 400 (241.3 Mg) MG tablet Take 400 mg by  mouth at bedtime.    Yes Historical Provider, MD  metoprolol tartrate (LOPRESSOR) 25 MG tablet TAKE 1 TABLET(25 MG) BY MOUTH TWICE DAILY 10/04/15  Yes Wellington Hampshire, MD  omeprazole (PRILOSEC) 20 MG capsule TAKE 1 CAPSULE(20 MG) BY MOUTH DAILY 09/20/15  Yes Cammie Sickle, MD  rivaroxaban (XARELTO) 20 MG TABS tablet Take 1 tablet (20 mg total) by mouth daily with supper. 06/29/15  Yes Wellington Hampshire, MD  rosuvastatin (CRESTOR) 10 MG tablet Take 1 tablet (10 mg total) by mouth daily. 10/25/15  Yes Roselee Nova, MD  Vitamin D, Ergocalciferol, (DRISDOL) 50000 units CAPS capsule Take 1 capsule (50,000 Units total) by mouth once a week. For 12 weeks 07/11/15  Yes Roselee Nova, MD  clindamycin (CLEOCIN) 300 MG capsule Take 1 capsule (300 mg total) by mouth 4 (four) times daily. 11/09/15   Roselee Nova, MD    Family History Family History  Problem Relation Age of Onset  . Hypertension Mother     Social History Social History  Substance Use Topics  . Smoking status: Former Smoker    Packs/day: 4.50    Years: 15.00    Types: Cigarettes  . Smokeless tobacco: Never Used  . Alcohol use No     Allergies   Celebrex [celecoxib] and Penicillins   Review of Systems Review of Systems   Physical Exam Triage Vital Signs ED Triage Vitals  Enc Vitals Group     BP 11/10/15 1610 (!) 143/73     Pulse Rate 11/10/15 1610 71     Resp 11/10/15 1610 16     Temp 11/10/15 1610 98.7 F (37.1 C)     Temp Source 11/10/15 1610 Oral     SpO2 11/10/15 1610 98 %     Weight 11/10/15 1611 256 lb (116.1 kg)     Height 11/10/15 1611 5\' 7"  (1.702 m)     Head Circumference --      Peak Flow --      Pain Score 11/10/15 1612 2     Pain Loc --      Pain Edu? --      Excl. in [place]? --    No data found.   Updated Vital Signs BP (!) 143/73 (BP Location: Left Arm)   Pulse 71   Temp 98.7 F (37.1 C) (Oral)   Resp 16   Ht 5\' 7"  (1.702 m)   Wt 256 lb (116.1 kg)   SpO2 98%   BMI 40.10 kg/m     Visual Acuity Right Eye Distance:   Left Eye Distance:   Bilateral Distance:    Right Eye Near:   Left Eye Near:    Bilateral Near:     Physical Exam  Constitutional: He appears well-developed and well-nourished. No distress.  Genitourinary: Testes normal.     Genitourinary Comments: Blanchable erythema, warmth and tenderness to the suprapubic skin area; approx. 3cm subcutaneous mass palpated in  this region also  Skin: He is not diaphoretic. There is erythema.  Nursing note and vitals reviewed.    UC Treatments / Results  Labs (all labs ordered are listed, but only abnormal results are displayed) Labs Reviewed - No data to display  EKG  EKG Interpretation None       Radiology No results found.  Procedures Procedures (including critical care time)  Medications Ordered in UC Medications - No data to display   Initial Impression / Assessment and Plan / UC Course  I have reviewed the triage vital signs and the nursing notes.  Pertinent labs & imaging results that were available during my care of the patient were reviewed by me and considered in my medical decision making (see chart for details).  Clinical Course      Final Clinical Impressions(s) / UC Diagnoses   Final diagnoses:  Cellulitis of groin    New Prescriptions Discharge Medication List as of 11/10/2015  5:03 PM     1. diagnosis reviewed with patient 2. Continue/re-start current antibiotic prescribed by PCP yesterday 3. Recommend warm compresses to area 4. Follow-up prn if symptoms worsen or don't improve   Norval Gable, MD 11/10/15 [phone]

## 2015-11-10 NOTE — Discharge Instructions (Signed)
Warm compresses to area for 15-20 minutes every 1-2 hours

## 2015-11-12 ENCOUNTER — Inpatient Hospital Stay: Payer: 59 | Attending: Internal Medicine

## 2015-11-12 ENCOUNTER — Ambulatory Visit
Admission: RE | Admit: 2015-11-12 | Discharge: 2015-11-12 | Disposition: A | Discharge status: Home / Self Care | Payer: 59 | Source: Ambulatory Visit | Attending: Family Medicine | Admitting: Family Medicine

## 2015-11-12 ENCOUNTER — Other Ambulatory Visit: Payer: Self-pay | Admitting: Family Medicine

## 2015-11-12 DIAGNOSIS — C8339 Diffuse large B-cell lymphoma, extranodal and solid organ sites: Secondary | ICD-10-CM | POA: Insufficient documentation

## 2015-11-12 DIAGNOSIS — I10 Essential (primary) hypertension: Secondary | ICD-10-CM | POA: Insufficient documentation

## 2015-11-12 DIAGNOSIS — I4891 Unspecified atrial fibrillation: Secondary | ICD-10-CM | POA: Diagnosis not present

## 2015-11-12 DIAGNOSIS — R1900 Intra-abdominal and pelvic swelling, mass and lump, unspecified site: Secondary | ICD-10-CM | POA: Diagnosis not present

## 2015-11-12 DIAGNOSIS — Z7901 Long term (current) use of anticoagulants: Secondary | ICD-10-CM | POA: Insufficient documentation

## 2015-11-12 DIAGNOSIS — R229 Localized swelling, mass and lump, unspecified: Secondary | ICD-10-CM

## 2015-11-12 DIAGNOSIS — R600 Localized edema: Secondary | ICD-10-CM | POA: Insufficient documentation

## 2015-11-12 DIAGNOSIS — Z9221 Personal history of antineoplastic chemotherapy: Secondary | ICD-10-CM | POA: Insufficient documentation

## 2015-11-12 DIAGNOSIS — K219 Gastro-esophageal reflux disease without esophagitis: Secondary | ICD-10-CM | POA: Diagnosis not present

## 2015-11-12 DIAGNOSIS — Z452 Encounter for adjustment and management of vascular access device: Secondary | ICD-10-CM | POA: Diagnosis not present

## 2015-11-12 DIAGNOSIS — Z79899 Other long term (current) drug therapy: Secondary | ICD-10-CM | POA: Insufficient documentation

## 2015-11-12 DIAGNOSIS — G4730 Sleep apnea, unspecified: Secondary | ICD-10-CM | POA: Insufficient documentation

## 2015-11-12 DIAGNOSIS — L02211 Cutaneous abscess of abdominal wall: Secondary | ICD-10-CM | POA: Diagnosis not present

## 2015-11-12 DIAGNOSIS — Z87891 Personal history of nicotine dependence: Secondary | ICD-10-CM | POA: Diagnosis not present

## 2015-11-12 DIAGNOSIS — M797 Fibromyalgia: Secondary | ICD-10-CM | POA: Diagnosis not present

## 2015-11-12 DIAGNOSIS — Z8673 Personal history of transient ischemic attack (TIA), and cerebral infarction without residual deficits: Secondary | ICD-10-CM | POA: Diagnosis not present

## 2015-11-12 DIAGNOSIS — C8330 Diffuse large B-cell lymphoma, unspecified site: Secondary | ICD-10-CM

## 2015-11-12 MED ORDER — HEPARIN SOD (PORK) LOCK FLUSH 100 UNIT/ML IV SOLN
500.0000 [IU] | Freq: Once | INTRAVENOUS | Status: AC
  Administered 2015-11-12: 500 [IU] via INTRAVENOUS
  Filled 2015-11-12: qty 5

## 2015-11-12 MED ORDER — SODIUM CHLORIDE 0.9% FLUSH
10.0000 mL | Freq: Once | INTRAVENOUS | Status: AC
  Administered 2015-11-12: 10 mL via INTRAVENOUS
  Filled 2015-11-12: qty 10

## 2015-11-16 ENCOUNTER — Encounter
Admission: RE | Admit: 2015-11-16 | Discharge: 2015-11-16 | Disposition: A | Discharge status: Home / Self Care | Payer: 59 | Source: Ambulatory Visit | Attending: Internal Medicine | Admitting: Internal Medicine

## 2015-11-16 DIAGNOSIS — C8330 Diffuse large B-cell lymphoma, unspecified site: Secondary | ICD-10-CM | POA: Diagnosis present

## 2015-11-16 LAB — GLUCOSE, CAPILLARY: Glucose-Capillary: 103 mg/dL — ABNORMAL HIGH (ref 65–99)

## 2015-11-16 MED ORDER — FLUDEOXYGLUCOSE F - 18 (FDG) INJECTION
12.4400 | Freq: Once | INTRAVENOUS | Status: AC | PRN
  Administered 2015-11-16: 12.44 via INTRAVENOUS

## 2015-11-21 ENCOUNTER — Other Ambulatory Visit: Payer: Self-pay | Admitting: Family Medicine

## 2015-11-21 DIAGNOSIS — R229 Localized swelling, mass and lump, unspecified: Secondary | ICD-10-CM

## 2015-11-23 ENCOUNTER — Telehealth: Payer: Self-pay | Admitting: Family Medicine

## 2015-11-23 NOTE — Telephone Encounter (Signed)
PT SAID THAT HE HAS NOT RECEIVED A MESSAGE ABOUT HIS RESULTS OF HIS ULTRASOUND.Marland Kitchen

## 2015-11-26 NOTE — Telephone Encounter (Signed)
Patient has been notified of lab results on 11/15/2015, called patient again today and left voicemail.

## 2015-11-28 ENCOUNTER — Encounter: Payer: Self-pay | Admitting: General Surgery

## 2015-11-28 ENCOUNTER — Encounter: Payer: Self-pay | Admitting: Family Medicine

## 2015-11-28 ENCOUNTER — Ambulatory Visit (INDEPENDENT_AMBULATORY_CARE_PROVIDER_SITE_OTHER): Payer: 59 | Admitting: Family Medicine

## 2015-11-28 VITALS — BP 139/73 | HR 71 | Temp 99.0°F | Resp 16 | Ht 67.0 in | Wt 257.2 lb

## 2015-11-28 DIAGNOSIS — L02211 Cutaneous abscess of abdominal wall: Secondary | ICD-10-CM | POA: Diagnosis not present

## 2015-11-28 MED ORDER — CLINDAMYCIN HCL 300 MG PO CAPS: 300.0000 mg | ORAL_CAPSULE | Freq: Three times a day (TID) | ORAL | 0 refills | Status: DC

## 2015-11-28 NOTE — Progress Notes (Signed)
Name: Mason Houston   MRN: [id number]    DOB: 11-22-1950   Date:11/28/2015       Progress Note  Subjective  Chief Complaint  Chief Complaint  Patient presents with  . Follow-up    Ultrasound    HPI  Pt. Presents for evaluation of a subcutaneous infection in left lower abdominal wall, initially came on around the end of October, started off as itching in the left lower abdomen with a lump, ultrasound of the palpable area showed a possible samll abscess. From observation, the area looked like it had cellulitis. He has finished a 7-day course of Clindamycin, believes the lump has decreased in size, although still there.   Past Medical History:  Diagnosis Date  . Atrial fibrillation ([place])   . Cancer ([place])   . Collagen vascular disease ([place])   . Fibromyalgia   . GERD (gastroesophageal reflux disease)   . H/O Bell's palsy   . History of [place] measles   . Hypertension   . Mini stroke ([place])   . Osteoarthrosis, unspecified whether generalized or localized, lower leg   . Sixth cranial nerve palsy   . Sleep apnea   . Stroke [facility])     Past Surgical History:  Procedure Laterality Date  . CYST EXCISION    . ERCP N/A 12/29/2014   Procedure: ENDOSCOPIC RETROGRADE CHOLANGIOPANCREATOGRAPHY (ERCP);  Surgeon: Hulen Luster, MD;  Location: [facility] ENDOSCOPY;  Service: Endoscopy;  Laterality: N/A;  . ERCP N/A 02/21/2015   Procedure: ENDOSCOPIC RETROGRADE CHOLANGIOPANCREATOGRAPHY (ERCP);  Surgeon: Hulen Luster, MD;  Location: [facility] ENDOSCOPY;  Service: Gastroenterology;  Laterality: N/A;  . ERCP N/A 08/14/2015   Procedure: ENDOSCOPIC RETROGRADE CHOLANGIOPANCREATOGRAPHY (ERCP) Stent removal;  Surgeon: [name], MD;  Location: [facility] ENDOSCOPY;  Service: Endoscopy;  Laterality: N/A;  . PERIPHERAL VASCULAR CATHETERIZATION N/A 01/31/2015   Procedure: Glori Luis Cath Insertion;  Surgeon: Katha Cabal, MD;  Location: [place] CV LAB;  Service: Cardiovascular;  Laterality: N/A;  . TRIGGER FINGER RELEASE        Family History  Problem Relation Age of Onset  . Hypertension Mother     Social History   Social History  . Marital status: Married    Spouse name: N/A  . Number of children: N/A  . Years of education: N/A   Occupational History  . Not on file.   Social History Main Topics  . Smoking status: Former Smoker    Packs/day: 4.50    Years: 15.00    Types: Cigarettes  . Smokeless tobacco: Never Used  . Alcohol use No  . Drug use:     Types: Marijuana     Comment: PAST  . Sexual activity: Not on file   Other Topics Concern  . Not on file   Social History Narrative  . No narrative on file     Current Outpatient Prescriptions:  .  acetaminophen (TYLENOL) 500 MG tablet, Take 1,000 mg by mouth every 6 (six) hours as needed for mild pain, fever or headache. , Disp: , Rfl:  .  b complex vitamins capsule, Take 1 capsule by mouth at bedtime., Disp: , Rfl:  .  diltiazem (TIAZAC) 360 MG 24 hr capsule, TAKE 1 CAPSULE BY MOUTH EVERY DAY, Disp: 30 capsule, Rfl: 3 .  lidocaine-prilocaine (EMLA) cream, Apply 1 application topically as needed (prior to accessing port). Reported on 06/29/2015, Disp: , Rfl:  .  loratadine (CLARITIN) 10 MG tablet, Take 10 mg by mouth daily as needed for allergies. ,  Disp: , Rfl:  .  magnesium oxide (MAG-OX) 400 (241.3 Mg) MG tablet, Take 400 mg by mouth at bedtime. , Disp: , Rfl:  .  metoprolol tartrate (LOPRESSOR) 25 MG tablet, TAKE 1 TABLET(25 MG) BY MOUTH TWICE DAILY, Disp: 60 tablet, Rfl: 3 .  omeprazole (PRILOSEC) 20 MG capsule, TAKE 1 CAPSULE(20 MG) BY MOUTH DAILY, Disp: 30 capsule, Rfl: 3 .  rivaroxaban (XARELTO) 20 MG TABS tablet, Take 1 tablet (20 mg total) by mouth daily with supper., Disp: 30 tablet, Rfl: 3 .  rosuvastatin (CRESTOR) 10 MG tablet, Take 1 tablet (10 mg total) by mouth daily., Disp: 90 tablet, Rfl: 1 .  Vitamin D, Ergocalciferol, (DRISDOL) 50000 units CAPS capsule, Take 1 capsule (50,000 Units total) by mouth once a week. For 12  weeks, Disp: 12 capsule, Rfl: 0 .  clindamycin (CLEOCIN) 300 MG capsule, Take 1 capsule (300 mg total) by mouth 4 (four) times daily. (Patient not taking: Reported on 11/28/2015), Disp: 28 capsule, Rfl: 0  Allergies  Allergen Reactions  . Celebrex [Celecoxib] Hives  . Penicillins Hives and Other (See Comments)    Has patient had a PCN reaction causing immediate rash, facial/tongue/throat swelling, SOB or lightheadedness with hypotension: No Has patient had a PCN reaction causing severe rash involving mucus membranes or skin necrosis: No Has patient had a PCN reaction that required hospitalization No Has patient had a PCN reaction occurring within the last 10 years: No If all of the above answers are "NO", then may proceed with Cephalosporin use.     Review of Systems  Constitutional: Negative for chills, fever and malaise/fatigue.  Skin: Negative for itching and rash.      Objective  Vitals:   11/28/15 0800  BP: 139/73  Pulse: 71  Resp: 16  Temp: 99 F (37.2 C)  TempSrc: Oral  SpO2: 96%  Weight: 257 lb 3.2 oz (116.7 kg)  Height: 5\' 7"  (1.702 m)    Physical Exam  Constitutional: He is oriented to person, place, and time and well-developed, well-nourished, and in no distress.  Neurological: He is alert and oriented to person, place, and time.  Skin:     2.5 cm area of erythematous, palpable flat lesion consistent with a resolving abscess, no drainage visualized, The abscess and the overlying cellulitis has improved from last visit.  Nursing note and vitals reviewed.      Assessment & Plan  1. Cutaneous abscess of abdominal wall Confirmed on ultrasound, and will refer to general surgery for possible I&D. Started on a second round of clindamycin with instructions to use warm compresses 3-4 times daily. - clindamycin (CLEOCIN) 300 MG capsule; Take 1 capsule (300 mg total) by mouth 3 (three) times daily.  Dispense: 21 capsule; Refill: 0 - Ambulatory referral to  General Surgery      [place] Churchill Asad A. [facility] 11/28/2015 8:19 AM

## 2015-11-29 ENCOUNTER — Other Ambulatory Visit: Payer: Self-pay | Admitting: Cardiovascular Disease

## 2015-12-06 ENCOUNTER — Inpatient Hospital Stay (HOSPITAL_BASED_OUTPATIENT_CLINIC_OR_DEPARTMENT_OTHER): Payer: 59 | Admitting: Internal Medicine

## 2015-12-06 ENCOUNTER — Inpatient Hospital Stay: Payer: 59

## 2015-12-06 VITALS — BP 127/78 | HR 66 | Temp 97.6°F | Resp 20 | Ht 67.0 in | Wt 257.0 lb

## 2015-12-06 DIAGNOSIS — Z7901 Long term (current) use of anticoagulants: Secondary | ICD-10-CM

## 2015-12-06 DIAGNOSIS — Z79899 Other long term (current) drug therapy: Secondary | ICD-10-CM

## 2015-12-06 DIAGNOSIS — Z9221 Personal history of antineoplastic chemotherapy: Secondary | ICD-10-CM

## 2015-12-06 DIAGNOSIS — C8333 Diffuse large B-cell lymphoma, intra-abdominal lymph nodes: Secondary | ICD-10-CM | POA: Insufficient documentation

## 2015-12-06 DIAGNOSIS — C8330 Diffuse large B-cell lymphoma, unspecified site: Secondary | ICD-10-CM

## 2015-12-06 DIAGNOSIS — I4891 Unspecified atrial fibrillation: Secondary | ICD-10-CM

## 2015-12-06 DIAGNOSIS — C8339 Diffuse large B-cell lymphoma, extranodal and solid organ sites: Secondary | ICD-10-CM

## 2015-12-06 DIAGNOSIS — L02211 Cutaneous abscess of abdominal wall: Secondary | ICD-10-CM

## 2015-12-06 DIAGNOSIS — Z95828 Presence of other vascular implants and grafts: Secondary | ICD-10-CM

## 2015-12-06 LAB — CBC WITH DIFFERENTIAL/PLATELET
Basophils Absolute: 0.1 10*3/uL (ref 0–0.1)
Basophils Relative: 1 %
Eosinophils Absolute: 0.3 10*3/uL (ref 0–0.7)
Eosinophils Relative: 5 %
HCT: 42 % (ref 40.0–52.0)
Hemoglobin: 14.2 g/dL (ref 13.0–18.0)
Lymphocytes Relative: 23 %
Lymphs Abs: 1.4 10*3/uL (ref 1.0–3.6)
MCH: 27.6 pg (ref 26.0–34.0)
MCHC: 33.8 g/dL (ref 32.0–36.0)
MCV: 81.6 fL (ref 80.0–100.0)
Monocytes Absolute: 0.6 10*3/uL (ref 0.2–1.0)
Monocytes Relative: 11 %
Neutro Abs: 3.7 10*3/uL (ref 1.4–6.5)
Neutrophils Relative %: 60 %
Platelets: 319 10*3/uL (ref 150–440)
RBC: 5.14 MIL/uL (ref 4.40–5.90)
RDW: 15.5 % — ABNORMAL HIGH (ref 11.5–14.5)
WBC: 6.1 10*3/uL (ref 3.8–10.6)

## 2015-12-06 LAB — COMPREHENSIVE METABOLIC PANEL
ALT: 35 U/L (ref 17–63)
AST: 44 U/L — ABNORMAL HIGH (ref 15–41)
Albumin: 4.3 g/dL (ref 3.5–5.0)
Alkaline Phosphatase: 135 U/L — ABNORMAL HIGH (ref 38–126)
Anion gap: 8 (ref 5–15)
BUN: 22 mg/dL — ABNORMAL HIGH (ref 6–20)
CO2: 29 mmol/L (ref 22–32)
Calcium: 9.3 mg/dL (ref 8.9–10.3)
Chloride: 101 mmol/L (ref 101–111)
Creatinine, Ser: 0.93 mg/dL (ref 0.61–1.24)
GFR calc Af Amer: >60 mL/min (ref >60)
GFR calc non Af Amer: >60 mL/min (ref >60)
Glucose, Bld: 108 mg/dL — ABNORMAL HIGH (ref 65–99)
Potassium: 4.5 mmol/L (ref 3.5–5.1)
Sodium: 138 mmol/L (ref 135–145)
Total Bilirubin: 0.7 mg/dL (ref 0.3–1.2)
Total Protein: 7.7 g/dL (ref 6.5–8.1)

## 2015-12-06 LAB — LACTATE DEHYDROGENASE: LDH: 155 U/L (ref 98–192)

## 2015-12-06 NOTE — Progress Notes (Signed)
Pt reports a recent abscess/subcutaneous infection in left lower abdominal wall approxiately. At the end of October. Pt was placed on abx for a period of time. Pt states that this area has decreased in size. He will be referred to a surgeon to determine if the area still needs to be lanced. Patient did not elect to see surgeon immediately for lancing procedure. He decided to wait until s/p pet scan to discuss the lancing the area.  Patient would also like to discuss influenza vaccine administration. He had a head cold last week. Pt still has some chest congestion which he is taking an influenza vaccine.  "Im much better now, but still have chest congestion."   RN advised pt that if he has cold like symptoms today, he may need to consider post poning the flu vaccine by a few weeks.

## 2015-12-06 NOTE — Assessment & Plan Note (Addendum)
#   DLBCL of pancreatic head-  Stage IE. Currently status post 5 cycles of R CHOP chemotherapy.  PET NOV NED. Discussed with the patient that scans will be ordered as needed clinically. He agrees.  # Mediport-okay to have explanted.  # Lower abdominal abscess/ improved. Defer to PCP/ surgeon.   # follow up in 3 months/labs/ no scan.   # I reviewed the blood work- with the patient in detail; also reviewed the imaging independently [as summarized above]; and with the patient in detail.    # 25 minutes face-to-face with the patient discussing the above plan of care; more than 50% of time spent on prognosis/ natural history; counseling and coordination.

## 2015-12-06 NOTE — Progress Notes (Signed)
[facility] OFFICE PROGRESS NOTE  Patient Care Team: Roselee Nova, MD as PCP - General (Internal Medicine) [name], MD (Infectious Diseases) Wellington Hampshire, MD as Consulting Physician (Cardiology)   SUMMARY OF ONCOLOGIC HISTORY: Oncology History   # JAN 2017- DIFFUSE LARGE B CELL LYMPHOMA of pancreatic head; STAGE IE; March 2017- R-CHOP x3; PET- CR; R-CHOP x5 [finished in April 2017]; STOP sec to PCP; July 24 PET NED except slight uptake around the biliary stent; NOV 2017- PET NED  # May 2017- PCP Pneumonia- on Bactrim  # Obstructive jaundice-sec to above s/p Stent [Dr.Oh]; # Lung nodules- Jan 2017- <27mm. 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  Patient recently noted to have a infection of his anterior low abdominal wall- 4 which has been treated with antibiotics. Improving. He is awaiting a surgical evaluation for possible drainage.  No nausea no vomiting. Appetite is good.Patient denies any tingling and numbness in the hand and feet. No cough or shortness of breath or chest pain. no cough. No shortness of breath with exertion. Patient is trying to lose weight.  REVIEW OF SYSTEMS:  A complete 10 point review of system is done which is negative except mentioned above/history of present illness.   PAST MEDICAL HISTORY :  Past Medical History:  Diagnosis Date  . Atrial fibrillation ([place])   . Cancer ([place])   . Collagen vascular  disease ([place])   . Fibromyalgia   . GERD (gastroesophageal reflux disease)   . H/O Bell's palsy   . History of [place] measles   . Hypertension   . Mini stroke ([place])   . Osteoarthrosis, unspecified whether generalized or localized, lower leg   . Sixth cranial nerve palsy   . Sleep apnea   . Stroke [facility])     PAST SURGICAL HISTORY :   Past Surgical History:  Procedure Laterality Date  . CYST EXCISION    . ERCP N/A 12/29/2014   Procedure: ENDOSCOPIC RETROGRADE CHOLANGIOPANCREATOGRAPHY (ERCP);  Surgeon: Hulen Luster, MD;  Location: [facility] ENDOSCOPY;  Service: Endoscopy;  Laterality: N/A;  . ERCP N/A 02/21/2015   Procedure: ENDOSCOPIC RETROGRADE CHOLANGIOPANCREATOGRAPHY (ERCP);  Surgeon: Hulen Luster, MD;  Location: [facility] ENDOSCOPY;  Service: Gastroenterology;  Laterality: N/A;  . ERCP N/A 08/14/2015   Procedure: ENDOSCOPIC RETROGRADE CHOLANGIOPANCREATOGRAPHY (ERCP) Stent removal;  Surgeon: [name], MD;  Location: [facility] ENDOSCOPY;  Service: Endoscopy;  Laterality: N/A;  . PERIPHERAL VASCULAR CATHETERIZATION N/A 01/31/2015   Procedure: Glori Luis Cath Insertion;  Surgeon: Katha Cabal, MD;  Location: [place] CV LAB;  Service: Cardiovascular;  Laterality: N/A;  . TRIGGER FINGER RELEASE      FAMILY HISTORY :   Family History  Problem Relation Age of Onset  . Hypertension Mother     SOCIAL HISTORY:   Social History  Substance Use Topics  . Smoking status: Former Smoker    Packs/day: 4.50    Years: 15.00  Types: Cigarettes  . Smokeless tobacco: Never Used  . Alcohol use No    ALLERGIES:  is allergic to celebrex [celecoxib] and penicillins.  MEDICATIONS:  Current Outpatient Prescriptions  Medication Sig Dispense Refill  . acetaminophen (TYLENOL) 500 MG tablet Take 1,000 mg by mouth every 6 (six) hours as needed for mild pain, fever or headache.     . b complex vitamins capsule Take 1 capsule by mouth at bedtime.    . clindamycin (CLEOCIN) 300 MG capsule Take 1 capsule (300 mg  total) by mouth 3 (three) times daily. 21 capsule 0  . diltiazem (TIAZAC) 360 MG 24 hr capsule TAKE 1 CAPSULE BY MOUTH EVERY DAY 30 capsule 3  . magnesium oxide (MAG-OX) 400 (241.3 Mg) MG tablet Take 400 mg by mouth at bedtime.     Marland Kitchen omeprazole (PRILOSEC) 20 MG capsule TAKE 1 CAPSULE(20 MG) BY MOUTH DAILY 30 capsule 3  . rivaroxaban (XARELTO) 20 MG TABS tablet Take 1 tablet (20 mg total) by mouth daily with supper. 30 tablet 3  . rosuvastatin (CRESTOR) 10 MG tablet Take 1 tablet (10 mg total) by mouth daily. 90 tablet 1  . lidocaine-prilocaine (EMLA) cream Apply 1 application topically as needed (prior to accessing port). Reported on 06/29/2015     No current facility-administered medications for this visit.     PHYSICAL EXAMINATION: ECOG PERFORMANCE STATUS: 0 - Asymptomatic  BP 127/78   Pulse 66   Temp 97.6 F (36.4 C) (Tympanic)   Resp 20   Ht 5\' 7"  (1.702 m)   Wt 257 lb (116.6 kg)   BMI 40.25 kg/m   Filed Weights   12/06/15 1537  Weight: 257 lb (116.6 kg)    GENERAL: Well-nourished well-developed; Alert, no distress and comfortable. He is alone. EYES: no pallor or icterus OROPHARYNX: no thrush or ulceration; good dentition  NECK: supple, no masses felt LYMPH:  no palpable lymphadenopathy in the cervical, axillary or inguinal regions LUNGS: decreased breath sounds to auscultation and  No wheeze or crackles HEART/CVS: regular rate & rhythm and no murmurs; No lower extremity edema ABDOMEN:abdomen soft, non-tender and normal bowel sounds Musculoskeletal:no cyanosis of digits and no clubbing  PSYCH: alert & oriented x 3 with fluent speech NEURO: no focal motor/sensory deficits SKIN:  no rashes or significant lesions; mediport accessed.   LABORATORY DATA:  I have reviewed the data as listed    Component Value Date/Time   NA 138 12/06/2015 1500   NA 142 05/15/2015 1156   NA 139 12/19/2011 1044   K 4.5 12/06/2015 1500   K 4.1 12/19/2011 1044   CL 101 12/06/2015 1500   CL  106 12/19/2011 1044   CO2 29 12/06/2015 1500   CO2 26 12/19/2011 1044   GLUCOSE 108 (H) 12/06/2015 1500   GLUCOSE 95 12/19/2011 1044   BUN 22 (H) 12/06/2015 1500   BUN 16 05/15/2015 1156   BUN 18 12/19/2011 1044   CREATININE 0.93 12/06/2015 1500   CREATININE 1.06 12/19/2011 1044   CALCIUM 9.3 12/06/2015 1500   CALCIUM 9.0 12/19/2011 1044   PROT 7.7 12/06/2015 1500   PROT 6.1 05/15/2015 1156   PROT 9.0 (H) 12/19/2011 1044   ALBUMIN 4.3 12/06/2015 1500   ALBUMIN 3.7 05/15/2015 1156   ALBUMIN 4.4 12/19/2011 1044   AST 44 (H) 12/06/2015 1500   AST 44 (H) 12/19/2011 1044   ALT 35 12/06/2015 1500   ALT 46 12/19/2011 1044   ALKPHOS 135 (H) 12/06/2015 1500   ALKPHOS 110  12/19/2011 1044   BILITOT 0.7 12/06/2015 1500   BILITOT 0.2 05/15/2015 1156   BILITOT 0.7 12/19/2011 1044   GFRNONAA >60 12/06/2015 1500   GFRNONAA >60 12/19/2011 1044   GFRAA >60 12/06/2015 1500   GFRAA >60 12/19/2011 1044    No results found for: SPEP, UPEP  Lab Results  Component Value Date   WBC 6.1 12/06/2015   NEUTROABS 3.7 12/06/2015   HGB 14.2 12/06/2015   HCT 42.0 12/06/2015   MCV 81.6 12/06/2015   PLT 319 12/06/2015      Chemistry      Component Value Date/Time   NA 138 12/06/2015 1500   NA 142 05/15/2015 1156   NA 139 12/19/2011 1044   K 4.5 12/06/2015 1500   K 4.1 12/19/2011 1044   CL 101 12/06/2015 1500   CL 106 12/19/2011 1044   CO2 29 12/06/2015 1500   CO2 26 12/19/2011 1044   BUN 22 (H) 12/06/2015 1500   BUN 16 05/15/2015 1156   BUN 18 12/19/2011 1044   CREATININE 0.93 12/06/2015 1500   CREATININE 1.06 12/19/2011 1044      Component Value Date/Time   CALCIUM 9.3 12/06/2015 1500   CALCIUM 9.0 12/19/2011 1044   ALKPHOS 135 (H) 12/06/2015 1500   ALKPHOS 110 12/19/2011 1044   AST 44 (H) 12/06/2015 1500   AST 44 (H) 12/19/2011 1044   ALT 35 12/06/2015 1500   ALT 46 12/19/2011 1044   BILITOT 0.7 12/06/2015 1500   BILITOT 0.2 05/15/2015 1156   BILITOT 0.7 12/19/2011 1044        ASSESSMENT & PLAN:   Diffuse large b-cell lymphoma, intra-abdominal lymph nodes (HCC) # DLBCL of pancreatic head-  Stage IE. Currently status post 5 cycles of R CHOP chemotherapy.  PET NOV NED. Discussed with the patient that scans will be ordered as needed clinically. He agrees.  # Mediport-okay to have explanted.  # Lower abdominal abscess/ improved. Defer to PCP/ surgeon.   # follow up in 3 months/labs/ no scan.   # I reviewed the blood work- with the patient in detail; also reviewed the imaging independently [as summarized above]; and with the patient in detail.    # 25 minutes face-to-face with the patient discussing the above plan of care; more than 50% of time spent on prognosis/ natural history; counseling and coordination.    Cammie Sickle, MD 12/06/2015 5:04 PM

## 2015-12-10 ENCOUNTER — Ambulatory Visit (INDEPENDENT_AMBULATORY_CARE_PROVIDER_SITE_OTHER): Payer: 59 | Admitting: Vascular Surgery

## 2015-12-10 ENCOUNTER — Encounter (INDEPENDENT_AMBULATORY_CARE_PROVIDER_SITE_OTHER): Payer: Self-pay | Admitting: Vascular Surgery

## 2015-12-10 VITALS — BP 129/80 | HR 69 | Resp 16 | Ht 66.5 in | Wt 261.6 lb

## 2015-12-10 DIAGNOSIS — E785 Hyperlipidemia, unspecified: Secondary | ICD-10-CM | POA: Diagnosis not present

## 2015-12-10 DIAGNOSIS — E118 Type 2 diabetes mellitus with unspecified complications: Secondary | ICD-10-CM

## 2015-12-10 DIAGNOSIS — C8330 Diffuse large B-cell lymphoma, unspecified site: Secondary | ICD-10-CM | POA: Diagnosis not present

## 2015-12-10 NOTE — Progress Notes (Signed)
Subjective:    Patient ID: Mason Houston, male    DOB: 1950/01/29, 65 y.o.   MRN: [id number] Chief Complaint  Patient presents with  . New Patient (Initial Visit)   Presents as new patient for "port-a-cath removal". Port-A-Cath was placed on 01/31/15 by Dr. Delana Meyer for non-hodgkin's lymphoma. He has finished his course of chemotherapy and is in remission. No issues with port-a-cath during treatment. Site healed well. Denies any fever, nausea or vomiting.    Review of Systems  Constitutional: Negative.   HENT: Negative.   Eyes: Negative.   Respiratory: Negative.   Cardiovascular: Negative.   Gastrointestinal: Negative.   Endocrine: Negative.   Genitourinary: Negative.   Musculoskeletal: Negative.   Skin: Negative.   Allergic/Immunologic: Negative.   Neurological: Negative.   Hematological: Negative.   Psychiatric/Behavioral: Negative.        Objective:   Physical Exam  Constitutional: He is oriented to person, place, and time. He appears well-developed and well-nourished.  HENT:  Head: Normocephalic and atraumatic.  Right Ear: External ear normal.  Left Ear: External ear normal.  Eyes: Conjunctivae and EOM are normal. Pupils are equal, round, and reactive to light.  Neck: Normal range of motion.  Cardiovascular: Normal rate, regular rhythm, normal heart sounds and intact distal pulses.   Pulses:      Radial pulses are 2+ on the right side, and 2+ on the left side.       Dorsalis pedis pulses are 2+ on the right side, and 2+ on the left side.       Posterior tibial pulses are 2+ on the right side, and 2+ on the left side.  Pulmonary/Chest: Effort normal and breath sounds normal.  Abdominal: Soft. Bowel sounds are normal.  Musculoskeletal: Normal range of motion. He exhibits edema.  Neurological: He is alert and oriented to person, place, and time.  Skin: Skin is warm and dry.  Port-A-Cath Site Healed. No infection noted.   Psychiatric: He has a normal mood and affect.  His behavior is normal. Judgment and thought content normal.   BP 129/80   Pulse 69   Resp 16   Ht 5' 6.5" (1.689 m)   Wt 261 lb 9.6 oz (118.7 kg)   BMI 41.59 kg/m   Past Medical History:  Diagnosis Date  . Atrial fibrillation ([place])   . Cancer ([place])   . Collagen vascular disease ([place])   . Fibromyalgia   . GERD (gastroesophageal reflux disease)   . H/O Bell's palsy   . History of [place] measles   . Hypertension   . Mini stroke ([place])   . Osteoarthrosis, unspecified whether generalized or localized, lower leg   . Sixth cranial nerve palsy   . Sleep apnea   . Stroke [facility])    Social History   Social History  . Marital status: Married    Spouse name: N/A  . Number of children: N/A  . Years of education: N/A   Occupational History  . Not on file.   Social History Main Topics  . Smoking status: Former Smoker    Packs/day: 4.50    Years: 15.00    Types: Cigarettes  . Smokeless tobacco: Never Used  . Alcohol use No  . Drug use:     Types: Marijuana     Comment: PAST  . Sexual activity: Not on file   Other Topics Concern  . Not on file   Social History Narrative  . No narrative on file   Past  Surgical History:  Procedure Laterality Date  . CYST EXCISION    . ERCP N/A 12/29/2014   Procedure: ENDOSCOPIC RETROGRADE CHOLANGIOPANCREATOGRAPHY (ERCP);  Surgeon: Hulen Luster, MD;  Location: [facility] ENDOSCOPY;  Service: Endoscopy;  Laterality: N/A;  . ERCP N/A 02/21/2015   Procedure: ENDOSCOPIC RETROGRADE CHOLANGIOPANCREATOGRAPHY (ERCP);  Surgeon: Hulen Luster, MD;  Location: [facility] ENDOSCOPY;  Service: Gastroenterology;  Laterality: N/A;  . ERCP N/A 08/14/2015   Procedure: ENDOSCOPIC RETROGRADE CHOLANGIOPANCREATOGRAPHY (ERCP) Stent removal;  Surgeon: [name], MD;  Location: [facility] ENDOSCOPY;  Service: Endoscopy;  Laterality: N/A;  . PERIPHERAL VASCULAR CATHETERIZATION N/A 01/31/2015   Procedure: Glori Luis Cath Insertion;  Surgeon: Katha Cabal, MD;  Location: [place] CV LAB;   Service: Cardiovascular;  Laterality: N/A;  . TRIGGER FINGER RELEASE     Family History  Problem Relation Age of Onset  . Hypertension Mother    Allergies  Allergen Reactions  . Celebrex [Celecoxib] Hives  . Penicillins Hives and Other (See Comments)    Has patient had a PCN reaction causing immediate rash, facial/tongue/throat swelling, SOB or lightheadedness with hypotension: No Has patient had a PCN reaction causing severe rash involving mucus membranes or skin necrosis: No Has patient had a PCN reaction that required hospitalization No Has patient had a PCN reaction occurring within the last 10 years: No If all of the above answers are "NO", then may proceed with Cephalosporin use.      Assessment & Plan:  Presents as new patient for "port-a-cath removal". Port-A-Cath was placed on 01/31/15 by Dr. Delana Meyer for non-hodgkin's lymphoma. He has finished his course of chemotherapy and is in remission. No issues with port-a-cath during treatment. Site healed well. Denies any fever, nausea or vomiting.   1. 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 Delana Meyer for non-hodgkin's lymphoma. He has finished his course of chemotherapy and is in remission. Presents for port-a-cath removal.   2. Hyperlipidemia, unspecified hyperlipidemia type - Stable Encouraged good control as its slows the progression of atherosclerotic disease  3. Type 2 diabetes mellitus with complication, unspecified long term insulin use status (HCC) - Stable Encouraged good control as its slows the progression of atherosclerotic disease  Current Outpatient Prescriptions on File Prior to Visit  Medication Sig Dispense Refill  . acetaminophen (TYLENOL) 500 MG tablet Take 1,000 mg by mouth every 6 (six) hours as needed for mild pain, fever or headache.     . b complex vitamins capsule Take 1 capsule by mouth at bedtime.    Marland Kitchen diltiazem (TIAZAC) 360 MG 24 hr  capsule TAKE 1 CAPSULE BY MOUTH EVERY DAY 30 capsule 3  . lidocaine-prilocaine (EMLA) cream Apply 1 application topically as needed (prior to accessing port). Reported on 06/29/2015    . magnesium oxide (MAG-OX) 400 (241.3 Mg) MG tablet Take 400 mg by mouth at bedtime.     Marland Kitchen omeprazole (PRILOSEC) 20 MG capsule TAKE 1 CAPSULE(20 MG) BY MOUTH DAILY 30 capsule 3  . rivaroxaban (XARELTO) 20 MG TABS tablet Take 1 tablet (20 mg total) by mouth daily with supper. 30 tablet 3  . rosuvastatin (CRESTOR) 10 MG tablet Take 1 tablet (10 mg total) by mouth daily. 90 tablet 1  . clindamycin (CLEOCIN) 300 MG capsule Take 1 capsule (300 mg total) by mouth 3 (three) times daily. (Patient not taking: Reported on 12/10/2015) 21 capsule 0   No current facility-administered medications on file prior to visit.  There are no Patient Instructions on file for this visit. No Follow-up on file.   Clements Toro A Joselyn Edling, PA-C

## 2015-12-12 ENCOUNTER — Encounter (INDEPENDENT_AMBULATORY_CARE_PROVIDER_SITE_OTHER): Payer: Self-pay

## 2015-12-12 ENCOUNTER — Other Ambulatory Visit (INDEPENDENT_AMBULATORY_CARE_PROVIDER_SITE_OTHER): Payer: Self-pay | Admitting: Vascular Surgery

## 2015-12-12 ENCOUNTER — Encounter: Payer: Self-pay | Admitting: General Surgery

## 2015-12-12 ENCOUNTER — Ambulatory Visit (INDEPENDENT_AMBULATORY_CARE_PROVIDER_SITE_OTHER): Payer: 59 | Admitting: General Surgery

## 2015-12-12 VITALS — BP 142/68 | HR 64 | Resp 14 | Ht 67.0 in | Wt 262.0 lb

## 2015-12-12 DIAGNOSIS — K651 Peritoneal abscess: Secondary | ICD-10-CM

## 2015-12-12 DIAGNOSIS — IMO0002 Reserved for concepts with insufficient information to code with codable children: Secondary | ICD-10-CM

## 2015-12-12 NOTE — Progress Notes (Signed)
Patient ID: Mason Houston, male   DOB: 1950/07/24, 65 y.o.   MRN: [id number]  Chief Complaint  Patient presents with  . Abscess    HPI Mason Houston is a 65 y.o. male.  Here today for evaluation of a lower left abdominal wall abscess. He first noticed this area at lest 3-4 weeks ago. He states it was itching, swelling and turned red. He has taken 2 rounds of antibiotics and used heat to the area. He states the area has cleared up for at least a week now.  He has completed all his treatments for the Non Hodgkin's lymphoma.  HPI  Past Medical History:  Diagnosis Date  . Atrial fibrillation ([place])   . Cancer ([place])   . Collagen vascular disease ([place])   . Fibromyalgia   . GERD (gastroesophageal reflux disease)   . H/O Bell's palsy   . History of [place] measles   . Hypertension   . Mini stroke ([place])   . Non Hodgkin's lymphoma ([place]) 2017  . Osteoarthrosis, unspecified whether generalized or localized, lower leg   . Sixth cranial nerve palsy   . Sleep apnea   . Stroke [facility])     Past Surgical History:  Procedure Laterality Date  . COLONOSCOPY  2012   Dr Candace Cruise  . CYST EXCISION    . ERCP N/A 12/29/2014   Procedure: ENDOSCOPIC RETROGRADE CHOLANGIOPANCREATOGRAPHY (ERCP);  Surgeon: Hulen Luster, MD;  Location: [facility] ENDOSCOPY;  Service: Endoscopy;  Laterality: N/A;  . ERCP N/A 02/21/2015   Procedure: ENDOSCOPIC RETROGRADE CHOLANGIOPANCREATOGRAPHY (ERCP);  Surgeon: Hulen Luster, MD;  Location: [facility] ENDOSCOPY;  Service: Gastroenterology;  Laterality: N/A;  . ERCP N/A 08/14/2015   Procedure: ENDOSCOPIC RETROGRADE CHOLANGIOPANCREATOGRAPHY (ERCP) Stent removal;  Surgeon: [name], MD;  Location: [facility] ENDOSCOPY;  Service: Endoscopy;  Laterality: N/A;  . PERIPHERAL VASCULAR CATHETERIZATION N/A 01/31/2015   Procedure: Glori Luis Cath Insertion;  Surgeon: Katha Cabal, MD;  Location: [place] CV LAB;  Service: Cardiovascular;  Laterality: N/A;  . TRIGGER FINGER RELEASE      Family History   Problem Relation Age of Onset  . Hypertension Mother   . Heart attack Father   . Stroke Father     Social History Social History  Substance Use Topics  . Smoking status: Former Smoker    Packs/day: 4.50    Years: 15.00    Types: Cigarettes  . Smokeless tobacco: Never Used  . Alcohol use No    Allergies  Allergen Reactions  . Celebrex [Celecoxib] Hives  . Penicillins Hives and Other (See Comments)    Has patient had a PCN reaction causing immediate rash, facial/tongue/throat swelling, SOB or lightheadedness with hypotension: No Has patient had a PCN reaction causing severe rash involving mucus membranes or skin necrosis: No Has patient had a PCN reaction that required hospitalization No Has patient had a PCN reaction occurring within the last 10 years: No If all of the above answers are "NO", then may proceed with Cephalosporin use.    Current Outpatient Prescriptions  Medication Sig Dispense Refill  . acetaminophen (TYLENOL) 500 MG tablet Take 1,000 mg by mouth every 6 (six) hours as needed for mild pain, fever or headache.     . b complex vitamins capsule Take 1 capsule by mouth at bedtime.    . clindamycin (CLEOCIN) 300 MG capsule Take 1 capsule (300 mg total) by mouth 3 (three) times daily. 21 capsule 0  . diltiazem (TIAZAC) 360 MG 24 hr capsule TAKE 1 CAPSULE  BY MOUTH EVERY DAY 30 capsule 3  . lidocaine-prilocaine (EMLA) cream Apply 1 application topically as needed (prior to accessing port). Reported on 06/29/2015    . magnesium oxide (MAG-OX) 400 (241.3 Mg) MG tablet Take 400 mg by mouth at bedtime.     . metoprolol tartrate (LOPRESSOR) 25 MG tablet   3  . omeprazole (PRILOSEC) 20 MG capsule TAKE 1 CAPSULE(20 MG) BY MOUTH DAILY 30 capsule 3  . rivaroxaban (XARELTO) 20 MG TABS tablet Take 1 tablet (20 mg total) by mouth daily with supper. 30 tablet 3  . rosuvastatin (CRESTOR) 10 MG tablet Take 1 tablet (10 mg total) by mouth daily. 90 tablet 1   No current  facility-administered medications for this visit.     Review of Systems Review of Systems  Constitutional: Negative.   Respiratory: Negative.   Cardiovascular: Negative.     Blood pressure (!) 142/68, pulse 64, resp. rate 14, height 5\' 7"  (1.702 m), weight 262 lb (118.8 kg).  Physical Exam Physical Exam  Constitutional: He is oriented to person, place, and time. He appears well-developed and well-nourished.  HENT:  Mouth/Throat: Oropharynx is clear and moist.  Eyes: Conjunctivae are normal. No scleral icterus.  Neck: Neck supple.  Abdominal: Soft. Normal appearance.    Small bruise <1 cm left abdominal fold.  Lymphadenopathy:    He has no cervical adenopathy.  Neurological: He is alert and oriented to person, place, and time.  Skin: Skin is warm and dry.  Psychiatric: His behavior is normal.    Data Reviewed PCP notes  Assessment    Resolved abdominal wall abscess.     Plan         Follow up as needed. The patient is aware to call back for any questions or concerns.    This information has been scribed by Karie Fetch RN, BSN,BC.   Robert Bellow 12/13/2015, 3:27 PM

## 2015-12-12 NOTE — Patient Instructions (Signed)
The patient is aware to call back for any questions or concerns.  

## 2015-12-13 DIAGNOSIS — IMO0002 Reserved for concepts with insufficient information to code with codable children: Secondary | ICD-10-CM | POA: Insufficient documentation

## 2015-12-17 ENCOUNTER — Inpatient Hospital Stay: Payer: 59

## 2015-12-17 MED ORDER — CLINDAMYCIN PHOSPHATE 300 MG/50ML IV SOLN: 300.0000 mg | Freq: Once | INTRAVENOUS | Status: DC

## 2015-12-18 ENCOUNTER — Ambulatory Visit
Admission: RE | Admit: 2015-12-18 | Discharge: 2015-12-18 | Disposition: A | Discharge status: Home / Self Care | Payer: 59 | Source: Ambulatory Visit | Attending: Vascular Surgery | Admitting: Vascular Surgery

## 2015-12-18 ENCOUNTER — Encounter: Payer: Self-pay | Admitting: Vascular Surgery

## 2015-12-18 ENCOUNTER — Encounter
Admission: RE | Disposition: A | Discharge status: Home / Self Care | Payer: Self-pay | Source: Ambulatory Visit | Attending: Vascular Surgery

## 2015-12-18 DIAGNOSIS — Z8673 Personal history of transient ischemic attack (TIA), and cerebral infarction without residual deficits: Secondary | ICD-10-CM | POA: Insufficient documentation

## 2015-12-18 DIAGNOSIS — E119 Type 2 diabetes mellitus without complications: Secondary | ICD-10-CM | POA: Diagnosis not present

## 2015-12-18 DIAGNOSIS — I10 Essential (primary) hypertension: Secondary | ICD-10-CM | POA: Insufficient documentation

## 2015-12-18 DIAGNOSIS — Z7902 Long term (current) use of antithrombotics/antiplatelets: Secondary | ICD-10-CM | POA: Diagnosis not present

## 2015-12-18 DIAGNOSIS — Z8619 Personal history of other infectious and parasitic diseases: Secondary | ICD-10-CM | POA: Diagnosis not present

## 2015-12-18 DIAGNOSIS — G510 Bell's palsy: Secondary | ICD-10-CM | POA: Insufficient documentation

## 2015-12-18 DIAGNOSIS — M797 Fibromyalgia: Secondary | ICD-10-CM | POA: Diagnosis not present

## 2015-12-18 DIAGNOSIS — M359 Systemic involvement of connective tissue, unspecified: Secondary | ICD-10-CM | POA: Insufficient documentation

## 2015-12-18 DIAGNOSIS — Z888 Allergy status to other drugs, medicaments and biological substances status: Secondary | ICD-10-CM | POA: Diagnosis not present

## 2015-12-18 DIAGNOSIS — Z452 Encounter for adjustment and management of vascular access device: Secondary | ICD-10-CM | POA: Diagnosis present

## 2015-12-18 DIAGNOSIS — Z87891 Personal history of nicotine dependence: Secondary | ICD-10-CM | POA: Insufficient documentation

## 2015-12-18 DIAGNOSIS — Z794 Long term (current) use of insulin: Secondary | ICD-10-CM | POA: Insufficient documentation

## 2015-12-18 DIAGNOSIS — C8339 Diffuse large B-cell lymphoma, extranodal and solid organ sites: Secondary | ICD-10-CM | POA: Diagnosis not present

## 2015-12-18 DIAGNOSIS — G4730 Sleep apnea, unspecified: Secondary | ICD-10-CM | POA: Diagnosis not present

## 2015-12-18 DIAGNOSIS — Z8249 Family history of ischemic heart disease and other diseases of the circulatory system: Secondary | ICD-10-CM | POA: Diagnosis not present

## 2015-12-18 DIAGNOSIS — K219 Gastro-esophageal reflux disease without esophagitis: Secondary | ICD-10-CM | POA: Diagnosis not present

## 2015-12-18 DIAGNOSIS — Z880 Allergy status to penicillin: Secondary | ICD-10-CM | POA: Insufficient documentation

## 2015-12-18 DIAGNOSIS — M1990 Unspecified osteoarthritis, unspecified site: Secondary | ICD-10-CM | POA: Diagnosis not present

## 2015-12-18 DIAGNOSIS — C8590 Non-Hodgkin lymphoma, unspecified, unspecified site: Secondary | ICD-10-CM | POA: Diagnosis not present

## 2015-12-18 DIAGNOSIS — I4891 Unspecified atrial fibrillation: Secondary | ICD-10-CM | POA: Diagnosis not present

## 2015-12-18 HISTORY — PX: PERIPHERAL VASCULAR CATHETERIZATION: SHX172C

## 2015-12-18 SURGERY — PORTA CATH REMOVAL
Anesthesia: Moderate Sedation

## 2015-12-18 MED ORDER — LIDOCAINE-EPINEPHRINE 1 %-1:100000 IJ SOLN
INTRAMUSCULAR | Status: AC
  Filled 2015-12-18: qty 1

## 2015-12-18 MED ORDER — FENTANYL CITRATE (PF) 100 MCG/2ML IJ SOLN
INTRAMUSCULAR | Status: DC | PRN
  Administered 2015-12-18 (×2): 50 ug via INTRAVENOUS

## 2015-12-18 MED ORDER — MIDAZOLAM HCL 2 MG/2ML IJ SOLN
INTRAMUSCULAR | Status: DC | PRN
  Administered 2015-12-18 (×2): 2 mg via INTRAVENOUS

## 2015-12-18 MED ORDER — ONDANSETRON HCL 4 MG/2ML IJ SOLN: 4.0000 mg | Freq: Four times a day (QID) | INTRAMUSCULAR | Status: DC | PRN

## 2015-12-18 MED ORDER — HYDROMORPHONE HCL 1 MG/ML IJ SOLN: 1.0000 mg | Freq: Once | INTRAMUSCULAR | Status: DC

## 2015-12-18 MED ORDER — FENTANYL CITRATE (PF) 100 MCG/2ML IJ SOLN
INTRAMUSCULAR | Status: AC
  Filled 2015-12-18: qty 2

## 2015-12-18 MED ORDER — SODIUM CHLORIDE 0.9 % IV SOLN
INTRAVENOUS | Status: DC
  Administered 2015-12-18: 11:00:00 via INTRAVENOUS

## 2015-12-18 MED ORDER — MIDAZOLAM HCL 5 MG/5ML IJ SOLN
INTRAMUSCULAR | Status: AC
  Filled 2015-12-18: qty 5

## 2015-12-18 SURGICAL SUPPLY — 7 items
ADH SKN CLS APL DERMABOND .7 (GAUZE/BANDAGES/DRESSINGS) ×1
DERMABOND ADVANCED (GAUZE/BANDAGES/DRESSINGS) ×2
DERMABOND ADVANCED .7 DNX12 (GAUZE/BANDAGES/DRESSINGS) ×1 IMPLANT
PACK ANGIOGRAPHY (CUSTOM PROCEDURE TRAY) ×3 IMPLANT
SUT MNCRL 4-0 (SUTURE) ×3
SUT MNCRL 4-0 27XMFL (SUTURE) ×1
SUTURE MNCRL 4-0 27XMF (SUTURE) ×1 IMPLANT

## 2015-12-18 NOTE — Op Note (Signed)
  OPERATIVE NOTE   PROCEDURE: Removal of Infuse-a-Port  PRE-OPERATIVE DIAGNOSIS: Lymphoma  POST-OPERATIVE DIAGNOSIS: Same  SURGEON: Hortencia Pilar, M.D. ASSISTANT(S): None  ANESTHESIA: IV sedation  ESTIMATED BLOOD LOSS: Minimal  FINDING(S): 1.  None  SPECIMEN(S):  Port intact  INDICATIONS:   Mason Houston is a 65 y.o. y.o. male who presents with completion of his treatment for lymphoma. Therefore he no longer needs his port and is being removed   DESCRIPTION: After obtaining full informed written consent, the patient is brought to special procedures and positioned supine.  The patient received IV antibiotics.  The patient was prepped and draped in the standard fashion appropriate time out is called.    After infiltrating 1% lidocaine with epinephrine into the soft tissues and skin surrounding the port the previous incisional scar is reopened with an 11 blade scalpel.  The port is slipped from the pocket and subsequently removed without difficulty otherwise intact.  Pressure is held at the base of the neck for 5 minutes, a pocket is irrigated. Subtotally the wound is packed with saline moistened gauze and sterile dressings applied.  The patient tolerated the procedure without changes in her overall condition she remains critically ill in guarded condition.  COMPLICATIONS: None  CONDITION: Good  Hortencia Pilar, M.D. [facility] Office: [phone]   12/18/2015, 1:22 PM

## 2015-12-18 NOTE — H&P (Signed)
[facility] History & Physical Update  The patient was interviewed and re-examined.  The patient's previous History and Physical has been reviewed and is unchanged.  There is no change in the plan of care. We plan to proceed with the scheduled procedure.  Hortencia Pilar, MD  12/18/2015, 12:12 PM

## 2015-12-30 ENCOUNTER — Other Ambulatory Visit: Payer: Self-pay | Admitting: Cardiovascular Disease

## 2016-01-15 ENCOUNTER — Ambulatory Visit (INDEPENDENT_AMBULATORY_CARE_PROVIDER_SITE_OTHER): Payer: 59

## 2016-01-15 DIAGNOSIS — Z23 Encounter for immunization: Secondary | ICD-10-CM | POA: Diagnosis not present

## 2016-01-28 ENCOUNTER — Ambulatory Visit: Payer: 59 | Admitting: Family Medicine

## 2016-01-29 ENCOUNTER — Other Ambulatory Visit: Payer: Self-pay | Admitting: Internal Medicine

## 2016-01-29 DIAGNOSIS — K219 Gastro-esophageal reflux disease without esophagitis: Secondary | ICD-10-CM

## 2016-01-29 NOTE — Telephone Encounter (Signed)
Dr. Rogue Bussing said that he will fill the script for now, but moving fwd pt needs to receive this rx from pcp since pt is no longer on active tx.

## 2016-02-06 ENCOUNTER — Inpatient Hospital Stay: Payer: 59 | Attending: Internal Medicine | Admitting: Internal Medicine

## 2016-02-06 ENCOUNTER — Inpatient Hospital Stay: Payer: 59

## 2016-02-06 VITALS — BP 151/81 | HR 60 | Temp 97.5°F | Resp 20 | Ht 67.0 in | Wt 268.0 lb

## 2016-02-06 DIAGNOSIS — Z8673 Personal history of transient ischemic attack (TIA), and cerebral infarction without residual deficits: Secondary | ICD-10-CM | POA: Insufficient documentation

## 2016-02-06 DIAGNOSIS — C8333 Diffuse large B-cell lymphoma, intra-abdominal lymph nodes: Secondary | ICD-10-CM

## 2016-02-06 DIAGNOSIS — Z7901 Long term (current) use of anticoagulants: Secondary | ICD-10-CM | POA: Diagnosis not present

## 2016-02-06 DIAGNOSIS — K219 Gastro-esophageal reflux disease without esophagitis: Secondary | ICD-10-CM | POA: Diagnosis not present

## 2016-02-06 DIAGNOSIS — M797 Fibromyalgia: Secondary | ICD-10-CM | POA: Insufficient documentation

## 2016-02-06 DIAGNOSIS — I4891 Unspecified atrial fibrillation: Secondary | ICD-10-CM | POA: Diagnosis not present

## 2016-02-06 DIAGNOSIS — Z87891 Personal history of nicotine dependence: Secondary | ICD-10-CM | POA: Insufficient documentation

## 2016-02-06 DIAGNOSIS — Z9221 Personal history of antineoplastic chemotherapy: Secondary | ICD-10-CM | POA: Diagnosis not present

## 2016-02-06 DIAGNOSIS — G4730 Sleep apnea, unspecified: Secondary | ICD-10-CM | POA: Insufficient documentation

## 2016-02-06 DIAGNOSIS — I10 Essential (primary) hypertension: Secondary | ICD-10-CM | POA: Insufficient documentation

## 2016-02-06 LAB — CBC WITH DIFFERENTIAL/PLATELET
Basophils Absolute: 0.1 10*3/uL (ref 0–0.1)
Basophils Relative: 1 %
Eosinophils Absolute: 0.4 10*3/uL (ref 0–0.7)
Eosinophils Relative: 5 %
HCT: 41.8 % (ref 40.0–52.0)
Hemoglobin: 14.2 g/dL (ref 13.0–18.0)
Lymphocytes Relative: 23 %
Lymphs Abs: 1.8 10*3/uL (ref 1.0–3.6)
MCH: 28.4 pg (ref 26.0–34.0)
MCHC: 34.1 g/dL (ref 32.0–36.0)
MCV: 83.5 fL (ref 80.0–100.0)
Monocytes Absolute: 0.8 10*3/uL (ref 0.2–1.0)
Monocytes Relative: 11 %
Neutro Abs: 4.5 10*3/uL (ref 1.4–6.5)
Neutrophils Relative %: 60 %
Platelets: 319 10*3/uL (ref 150–440)
RBC: 5.01 MIL/uL (ref 4.40–5.90)
RDW: 14.3 % (ref 11.5–14.5)
WBC: 7.6 10*3/uL (ref 3.8–10.6)

## 2016-02-06 LAB — COMPREHENSIVE METABOLIC PANEL
ALT: 20 U/L (ref 17–63)
AST: 26 U/L (ref 15–41)
Albumin: 4.2 g/dL (ref 3.5–5.0)
Alkaline Phosphatase: 98 U/L (ref 38–126)
Anion gap: 7 (ref 5–15)
BUN: 19 mg/dL (ref 6–20)
CO2: 29 mmol/L (ref 22–32)
Calcium: 9.2 mg/dL (ref 8.9–10.3)
Chloride: 103 mmol/L (ref 101–111)
Creatinine, Ser: 0.91 mg/dL (ref 0.61–1.24)
GFR calc Af Amer: >60 mL/min (ref >60)
GFR calc non Af Amer: >60 mL/min (ref >60)
Glucose, Bld: 118 mg/dL — ABNORMAL HIGH (ref 65–99)
Potassium: 4.5 mmol/L (ref 3.5–5.1)
Sodium: 139 mmol/L (ref 135–145)
Total Bilirubin: 0.5 mg/dL (ref 0.3–1.2)
Total Protein: 7.6 g/dL (ref 6.5–8.1)

## 2016-02-06 LAB — LACTATE DEHYDROGENASE: LDH: 136 U/L (ref 98–192)

## 2016-02-06 NOTE — Progress Notes (Signed)
Patient here for follow up with Dr. Rogue Bussing. He has an excellent appetite. He has no medical complaints

## 2016-02-06 NOTE — Assessment & Plan Note (Addendum)
#   DLBCL of pancreatic head-  Stage IE. Currently status post 5 cycles of R CHOP chemotherapy.  PET NOV NED. Clinically no evidence of recurrence. Educated the patient regarding the signs and symptoms of recurrence.  # Lower abdominal abscess- resolved.   # follow up in 4 months/labs/ no scan.

## 2016-02-06 NOTE — Progress Notes (Signed)
Mason Houston OFFICE PROGRESS NOTE  Patient Care Team: Roselee Nova, MD as PCP - General (Internal Medicine) [name], MD (Infectious Diseases) Wellington Hampshire, MD as Consulting Physician (Cardiology) Robert Bellow, MD (General Surgery) Roselee Nova, MD as Referring Physician (Family Medicine)   SUMMARY OF ONCOLOGIC HISTORY: Oncology History   # JAN 2017- DIFFUSE LARGE B CELL LYMPHOMA of pancreatic head; STAGE IE; March 2017- R-CHOP x3; PET- CR; R-CHOP x5 [finished in April 2017]; STOP sec to PCP; July 24 PET NED except slight uptake around the biliary stent; NOV 2017- PET NED  # May 2017- PCP Pneumonia- on Bactrim  # Obstructive jaundice-sec to above s/p Stent [Dr.Oh]; # Lung nodules- Jan 2017- <104mm. A.fib- off xarelto [mild blood in urine]     DLBCL (diffuse large B cell lymphoma) ([place])   01/19/2015 Initial Diagnosis    DLBCL (diffuse large B cell lymphoma) (HCC)       Diffuse large b-cell lymphoma, intra-abdominal lymph nodes ([place])   12/06/2015 Initial Diagnosis    Diffuse large b-cell lymphoma, intra-abdominal lymph nodes (HCC)       INTERVAL HISTORY:  66 year old  Male patient with above history of diffuse large B-cell lymphoma stage IE peripancreatic region s/p R-CHOP summer 2017Is here for follow-up.  Patient's only concern is weight gain.Denies any nausea vomiting abdominal pain. No nausea no vomiting. Appetite is good.Patient denies any tingling and numbness in the hand and feet. No cough or shortness of breath or chest pain. no cough. No shortness of breath with exertion.   REVIEW OF SYSTEMS:  A complete 10 point review of system is done which is negative except mentioned above/history of present illness.   PAST MEDICAL HISTORY :  Past Medical History:  Diagnosis Date  . Atrial fibrillation ([place])   . Cancer ([place])   . Collagen vascular disease ([place])   . Fibromyalgia   . GERD (gastroesophageal reflux disease)   . H/O Bell's  palsy   . History of [place] measles   . Hypertension   . Mini stroke ([place])   . Non Hodgkin's lymphoma ([place]) 2017  . Osteoarthrosis, unspecified whether generalized or localized, lower leg   . Sixth cranial nerve palsy   . Sleep apnea   . Stroke [facility])     PAST SURGICAL HISTORY :   Past Surgical History:  Procedure Laterality Date  . COLONOSCOPY  2012   Dr Candace Cruise  . CYST EXCISION    . ERCP N/A 12/29/2014   Procedure: ENDOSCOPIC RETROGRADE CHOLANGIOPANCREATOGRAPHY (ERCP);  Surgeon: Hulen Luster, MD;  Location: [facility] ENDOSCOPY;  Service: Endoscopy;  Laterality: N/A;  . ERCP N/A 02/21/2015   Procedure: ENDOSCOPIC RETROGRADE CHOLANGIOPANCREATOGRAPHY (ERCP);  Surgeon: Hulen Luster, MD;  Location[facility] ENDOSCOPY;  Service: Gastroenterology;  Laterality: N/A;  . ERCP N/A 08/14/2015   Procedure: ENDOSCOPIC RETROGRADE CHOLANGIOPANCREATOGRAPHY (ERCP) Stent removal;  Surgeon: [name], MD;  Location: [facility] ENDOSCOPY;  Service: Endoscopy;  Laterality: N/A;  . PERIPHERAL VASCULAR CATHETERIZATION N/A 01/31/2015   Procedure: Glori Luis Cath Insertion;  Surgeon: Katha Cabal, MD;  Location: [place] CV LAB;  Service: Cardiovascular;  Laterality: N/A;  . PERIPHERAL VASCULAR CATHETERIZATION N/A 12/18/2015   Procedure: Glori Luis Cath Removal;  Surgeon: Katha Cabal, MD;  Location: [place] CV LAB;  Service: Cardiovascular;  Laterality: N/A;  . TRIGGER FINGER RELEASE      FAMILY HISTORY :   Family History  Problem Relation Age of Onset  . Hypertension Mother   .  Heart attack Father   . Stroke Father     SOCIAL HISTORY:   Social History  Substance Use Topics  . Smoking status: Former Smoker    Packs/day: 4.50    Years: 15.00    Types: Cigarettes  . Smokeless tobacco: Never Used  . Alcohol use No    ALLERGIES:  is allergic to celebrex [celecoxib]; other; and penicillins.  MEDICATIONS:  Current Outpatient Prescriptions  Medication Sig Dispense Refill  . acetaminophen (TYLENOL) 650 MG CR  tablet Take 1,300 mg by mouth daily.    Marland Kitchen b complex vitamins capsule Take 1 capsule by mouth daily.     Marland Kitchen diltiazem (TIAZAC) 360 MG 24 hr capsule TAKE 1 CAPSULE BY MOUTH EVERY DAY 30 capsule 3  . magnesium oxide (MAG-OX) 400 (241.3 Mg) MG tablet Take 400 mg by mouth daily.     . metoprolol tartrate (LOPRESSOR) 25 MG tablet Take 25 mg by mouth daily.   3  . omeprazole (PRILOSEC) 20 MG capsule TAKE 1 CAPSULE(20 MG) BY MOUTH DAILY 30 capsule 0  . rosuvastatin (CRESTOR) 10 MG tablet Take 1 tablet (10 mg total) by mouth daily. 90 tablet 1  . XARELTO 20 MG TABS tablet TAKE 1 TABLET(20 MG) BY MOUTH DAILY WITH SUPPER 30 tablet 3   No current facility-administered medications for this visit.     PHYSICAL EXAMINATION: ECOG PERFORMANCE STATUS: 0 - Asymptomatic  BP (!) 151/81 (Patient Position: Sitting)   Pulse 60   Temp 97.5 F (36.4 C) (Tympanic)   Resp 20   Ht 5\' 7"  (1.702 m)   Wt 268 lb (121.6 kg)   BMI 41.97 kg/m   Filed Weights   02/06/16 1458  Weight: 268 lb (121.6 kg)    GENERAL: Well-nourished well-developed; Alert, no distress and comfortable. He is alone. EYES: no pallor or icterus OROPHARYNX: no thrush or ulceration; good dentition  NECK: supple, no masses felt LYMPH:  no palpable lymphadenopathy in the cervical, axillary or inguinal regions LUNGS: decreased breath sounds to auscultation and  No wheeze or crackles HEART/CVS: regular rate & rhythm and no murmurs; No lower extremity edema ABDOMEN:abdomen soft, non-tender and normal bowel sounds Musculoskeletal:no cyanosis of digits and no clubbing  PSYCH: alert & oriented x 3 with fluent speech NEURO: no focal motor/sensory deficits SKIN:  no rashes or significant lesions; mediport- explanted.   LABORATORY DATA:  I have reviewed the data as listed    Component Value Date/Time   NA 139 02/06/2016 1443   NA 142 05/15/2015 1156   NA 139 12/19/2011 1044   K 4.5 02/06/2016 1443   K 4.1 12/19/2011 1044   CL 103 02/06/2016  1443   CL 106 12/19/2011 1044   CO2 29 02/06/2016 1443   CO2 26 12/19/2011 1044   GLUCOSE 118 (H) 02/06/2016 1443   GLUCOSE 95 12/19/2011 1044   BUN 19 02/06/2016 1443   BUN 16 05/15/2015 1156   BUN 18 12/19/2011 1044   CREATININE 0.91 02/06/2016 1443   CREATININE 1.06 12/19/2011 1044   CALCIUM 9.2 02/06/2016 1443   CALCIUM 9.0 12/19/2011 1044   PROT 7.6 02/06/2016 1443   PROT 6.1 05/15/2015 1156   PROT 9.0 (H) 12/19/2011 1044   ALBUMIN 4.2 02/06/2016 1443   ALBUMIN 3.7 05/15/2015 1156   ALBUMIN 4.4 12/19/2011 1044   AST 26 02/06/2016 1443   AST 44 (H) 12/19/2011 1044   ALT 20 02/06/2016 1443   ALT 46 12/19/2011 1044   ALKPHOS 98 02/06/2016 1443  ALKPHOS 110 12/19/2011 1044   BILITOT 0.5 02/06/2016 1443   BILITOT 0.2 05/15/2015 1156   BILITOT 0.7 12/19/2011 1044   GFRNONAA >60 02/06/2016 1443   GFRNONAA >60 12/19/2011 1044   GFRAA >60 02/06/2016 1443   GFRAA >60 12/19/2011 1044    No results found for: SPEP, UPEP  Lab Results  Component Value Date   WBC 7.6 02/06/2016   NEUTROABS 4.5 02/06/2016   HGB 14.2 02/06/2016   HCT 41.8 02/06/2016   MCV 83.5 02/06/2016   PLT 319 02/06/2016      Chemistry      Component Value Date/Time   NA 139 02/06/2016 1443   NA 142 05/15/2015 1156   NA 139 12/19/2011 1044   K 4.5 02/06/2016 1443   K 4.1 12/19/2011 1044   CL 103 02/06/2016 1443   CL 106 12/19/2011 1044   CO2 29 02/06/2016 1443   CO2 26 12/19/2011 1044   BUN 19 02/06/2016 1443   BUN 16 05/15/2015 1156   BUN 18 12/19/2011 1044   CREATININE 0.91 02/06/2016 1443   CREATININE 1.06 12/19/2011 1044      Component Value Date/Time   CALCIUM 9.2 02/06/2016 1443   CALCIUM 9.0 12/19/2011 1044   ALKPHOS 98 02/06/2016 1443   ALKPHOS 110 12/19/2011 1044   AST 26 02/06/2016 1443   AST 44 (H) 12/19/2011 1044   ALT 20 02/06/2016 1443   ALT 46 12/19/2011 1044   BILITOT 0.5 02/06/2016 1443   BILITOT 0.2 05/15/2015 1156   BILITOT 0.7 12/19/2011 1044        ASSESSMENT & PLAN:   Diffuse large b-cell lymphoma, intra-abdominal lymph nodes (HCC) # DLBCL of pancreatic head-  Stage IE. Currently status post 5 cycles of R CHOP chemotherapy.  PET NOV NED. Clinically no evidence of recurrence. Educated the patient regarding the signs and symptoms of recurrence.  # Lower abdominal abscess- resolved.   # follow up in 4 months/labs/ no scan.     Cammie Sickle, MD 02/06/2016 4:15 PM

## 2016-02-28 ENCOUNTER — Other Ambulatory Visit: Payer: Self-pay | Admitting: Internal Medicine

## 2016-03-18 ENCOUNTER — Telehealth: Payer: Self-pay | Admitting: *Deleted

## 2016-03-18 NOTE — Telephone Encounter (Signed)
patient agrees to appt Thursday at 945

## 2016-03-18 NOTE — Telephone Encounter (Signed)
Per Dr Rogue Bussing, we can see the pt on Thursday at 9:45, please notify pt and add to schedule if he is willing to come then (this is the only available appt this week)

## 2016-03-18 NOTE — Telephone Encounter (Signed)
Having intermittent sharpe pains in right ribs area over the liver for 1 week, He had an appt to see his neurologist so he asked him about it and it was suggested he contact Dr Rogue Bussing for evaluation or at least blood workup given his history of cancer. He had been pulling weeds last week prior to the pain starting. His fu appt is not until May. Please advise

## 2016-03-20 ENCOUNTER — Inpatient Hospital Stay: Payer: 59 | Attending: Internal Medicine | Admitting: Internal Medicine

## 2016-03-20 ENCOUNTER — Inpatient Hospital Stay: Payer: 59

## 2016-03-20 ENCOUNTER — Encounter: Payer: Self-pay | Admitting: *Deleted

## 2016-03-20 VITALS — BP 140/71 | HR 59 | Temp 99.1°F | Wt 265.1 lb

## 2016-03-20 DIAGNOSIS — Z9221 Personal history of antineoplastic chemotherapy: Secondary | ICD-10-CM | POA: Insufficient documentation

## 2016-03-20 DIAGNOSIS — I10 Essential (primary) hypertension: Secondary | ICD-10-CM | POA: Diagnosis not present

## 2016-03-20 DIAGNOSIS — M797 Fibromyalgia: Secondary | ICD-10-CM | POA: Diagnosis not present

## 2016-03-20 DIAGNOSIS — Z87891 Personal history of nicotine dependence: Secondary | ICD-10-CM | POA: Insufficient documentation

## 2016-03-20 DIAGNOSIS — R0789 Other chest pain: Secondary | ICD-10-CM | POA: Insufficient documentation

## 2016-03-20 DIAGNOSIS — C8333 Diffuse large B-cell lymphoma, intra-abdominal lymph nodes: Secondary | ICD-10-CM | POA: Diagnosis not present

## 2016-03-20 DIAGNOSIS — K219 Gastro-esophageal reflux disease without esophagitis: Secondary | ICD-10-CM | POA: Insufficient documentation

## 2016-03-20 DIAGNOSIS — Z8673 Personal history of transient ischemic attack (TIA), and cerebral infarction without residual deficits: Secondary | ICD-10-CM | POA: Insufficient documentation

## 2016-03-20 DIAGNOSIS — G4730 Sleep apnea, unspecified: Secondary | ICD-10-CM | POA: Insufficient documentation

## 2016-03-20 DIAGNOSIS — Z79899 Other long term (current) drug therapy: Secondary | ICD-10-CM | POA: Insufficient documentation

## 2016-03-20 DIAGNOSIS — Z7901 Long term (current) use of anticoagulants: Secondary | ICD-10-CM | POA: Insufficient documentation

## 2016-03-20 DIAGNOSIS — I4891 Unspecified atrial fibrillation: Secondary | ICD-10-CM | POA: Insufficient documentation

## 2016-03-20 LAB — COMPREHENSIVE METABOLIC PANEL
ALT: 22 U/L (ref 17–63)
AST: 30 U/L (ref 15–41)
Albumin: 4.3 g/dL (ref 3.5–5.0)
Alkaline Phosphatase: 103 U/L (ref 38–126)
Anion gap: 8 (ref 5–15)
BUN: 15 mg/dL (ref 6–20)
CO2: 29 mmol/L (ref 22–32)
Calcium: 9.2 mg/dL (ref 8.9–10.3)
Chloride: 103 mmol/L (ref 101–111)
Creatinine, Ser: 1.05 mg/dL (ref 0.61–1.24)
GFR calc Af Amer: >60 mL/min (ref >60)
GFR calc non Af Amer: >60 mL/min (ref >60)
Glucose, Bld: 128 mg/dL — ABNORMAL HIGH (ref 65–99)
Potassium: 4.6 mmol/L (ref 3.5–5.1)
Sodium: 140 mmol/L (ref 135–145)
Total Bilirubin: 0.5 mg/dL (ref 0.3–1.2)
Total Protein: 7.8 g/dL (ref 6.5–8.1)

## 2016-03-20 LAB — CBC WITH DIFFERENTIAL/PLATELET
Basophils Absolute: 0.1 10*3/uL (ref 0–0.1)
Basophils Relative: 1 %
Eosinophils Absolute: 0.3 10*3/uL (ref 0–0.7)
Eosinophils Relative: 4 %
HCT: 41.8 % (ref 40.0–52.0)
Hemoglobin: 14.2 g/dL (ref 13.0–18.0)
Lymphocytes Relative: 19 %
Lymphs Abs: 1.5 10*3/uL (ref 1.0–3.6)
MCH: 28.4 pg (ref 26.0–34.0)
MCHC: 34 g/dL (ref 32.0–36.0)
MCV: 83.5 fL (ref 80.0–100.0)
Monocytes Absolute: 0.7 10*3/uL (ref 0.2–1.0)
Monocytes Relative: 9 %
Neutro Abs: 5.4 10*3/uL (ref 1.4–6.5)
Neutrophils Relative %: 67 %
Platelets: 348 10*3/uL (ref 150–440)
RBC: 5 MIL/uL (ref 4.40–5.90)
RDW: 14 % (ref 11.5–14.5)
WBC: 8 10*3/uL (ref 3.8–10.6)

## 2016-03-20 LAB — LACTATE DEHYDROGENASE: LDH: 135 U/L (ref 98–192)

## 2016-03-20 NOTE — Assessment & Plan Note (Signed)
#   DLBCL of pancreatic head-  Stage IE. Currently status post 5 cycles of R CHOP chemotherapy.  PET NOV NED.   # Clinically no evidence of recurrence. Educated the patient regarding the signs and symptoms of recurrence.  # Right chest wall pain- likely musculoskeletal- recommend NSAIDs if needed.  On xarelto. Clinically does not appear to be PE.   # follow up in 4 months/labs/ no scan.

## 2016-03-20 NOTE — Progress Notes (Signed)
Right side pain, only hurts when coughs or taking deep breath

## 2016-03-20 NOTE — Progress Notes (Signed)
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 A.fib- off xarelto [mild blood in urine]     Diffuse large b-cell lymphoma, intra-abdominal lymph nodes ([place])   12/06/2015 Initial Diagnosis    Diffuse large b-cell lymphoma, intra-abdominal lymph nodes (HCC)       INTERVAL HISTORY:  66 year old  Male patient with above history of diffuse large B-cell lymphoma stage IE peripancreatic region s/p R-CHOP summer 2017Is here for follow-up; And also to discuss the recent onset of right chest wall pain.  Patient states noted to have right chest wall pain pleuritic in nature over the last few weeks. Noted to have pain started after heavy yard work. It is 1-2 on a scale of 10. He has not been taking any pain medications. Denies any nausea vomiting abdominal pain. No nausea no vomiting. Appetite is good.Patient denies any tingling and numbness in the hand and feet. No cough or shortness of breath or chest pain.   REVIEW OF SYSTEMS:  A complete 10 point review of system is done which is negative except mentioned above/history of present illness.   PAST MEDICAL HISTORY :  Past Medical History:  Diagnosis Date  . Atrial fibrillation ([place])   . Cancer ([place])   . Collagen vascular disease ([place])   . Fibromyalgia    . GERD (gastroesophageal reflux disease)   . H/O Bell's palsy   . History of [place] measles   . Hypertension   . Mini stroke ([place])   . Non Hodgkin's lymphoma ([place]) 2017  . Osteoarthrosis, unspecified whether generalized or localized, lower leg   . Sixth cranial nerve palsy   . Sleep apnea   . Stroke [facility])     PAST SURGICAL HISTORY :   Past Surgical History:  Procedure Laterality Date  . COLONOSCOPY  2012   Dr Candace Cruise  . CYST EXCISION    . ERCP N/A 12/29/2014   Procedure: ENDOSCOPIC RETROGRADE CHOLANGIOPANCREATOGRAPHY (ERCP);  Surgeon: Hulen Luster, MD;  Location: [facility] ENDOSCOPY;  Service: Endoscopy;  Laterality: N/A;  . ERCP N/A 02/21/2015   Procedure: ENDOSCOPIC RETROGRADE CHOLANGIOPANCREATOGRAPHY (ERCP);  Surgeon: Hulen Luster, MD;  Location: [facility] ENDOSCOPY;  Service: Gastroenterology;  Laterality: N/A;  . ERCP N/A 08/14/2015   Procedure: ENDOSCOPIC RETROGRADE CHOLANGIOPANCREATOGRAPHY (ERCP) Stent removal;  Surgeon: [name], MD;  Location: [facility] ENDOSCOPY;  Service: Endoscopy;  Laterality: N/A;  . PERIPHERAL VASCULAR CATHETERIZATION N/A 01/31/2015   Procedure: Glori Luis Cath Insertion;  Surgeon: Katha Cabal, MD;  Location: [place] CV LAB;  Service: Cardiovascular;  Laterality: N/A;  . PERIPHERAL VASCULAR CATHETERIZATION N/A 12/18/2015   Procedure: Glori Luis Cath Removal;  Surgeon: Katha Cabal, MD;  Location: [place] CV LAB;  Service: Cardiovascular;  Laterality: N/A;  . TRIGGER FINGER RELEASE      FAMILY HISTORY :  Family History  Problem Relation Age of Onset  . Hypertension Mother   . Heart attack Father   . Stroke Father     SOCIAL HISTORY:   Social History  Substance Use Topics  . Smoking status: Former Smoker    Packs/day: 4.50    Years: 15.00    Types: Cigarettes  . Smokeless tobacco: Never Used  . Alcohol use No    ALLERGIES:  is allergic to celebrex [celecoxib]; other; and penicillins.  MEDICATIONS:  Current Outpatient Prescriptions  Medication  Sig Dispense Refill  . acetaminophen (TYLENOL) 650 MG CR tablet Take 1,300 mg by mouth daily.    Marland Kitchen b complex vitamins capsule Take 1 capsule by mouth daily.     Marland Kitchen diltiazem (TIAZAC) 360 MG 24 hr capsule TAKE 1 CAPSULE BY MOUTH EVERY DAY 30 capsule 3  . magnesium oxide (MAG-OX) 400 (241.3 Mg) MG tablet Take 400 mg by mouth daily.     . metoprolol tartrate (LOPRESSOR) 25 MG tablet Take 25 mg by mouth daily.   3  . omeprazole (PRILOSEC) 20 MG capsule TAKE 1 CAPSULE(20 MG) BY MOUTH DAILY 30 capsule 0  . rosuvastatin (CRESTOR) 10 MG tablet Take 1 tablet (10 mg total) by mouth daily. 90 tablet 1  . XARELTO 20 MG TABS tablet TAKE 1 TABLET(20 MG) BY MOUTH DAILY WITH SUPPER 30 tablet 3   No current facility-administered medications for this visit.     PHYSICAL EXAMINATION: ECOG PERFORMANCE STATUS: 0 - Asymptomatic  BP 140/71 (BP Location: Left Arm)   Pulse (!) 59   Temp 99.1 F (37.3 C) (Tympanic)   Wt 265 lb 2 oz (120.3 kg)   BMI 41.52 kg/m   Filed Weights   03/20/16 0934  Weight: 265 lb 2 oz (120.3 kg)    GENERAL: Well-nourished well-developed; Alert, no distress and comfortable. He is alone. EYES: no pallor or icterus OROPHARYNX: no thrush or ulceration; good dentition  NECK: supple, no masses felt LYMPH:  no palpable lymphadenopathy in the cervical, axillary or inguinal regions LUNGS: decreased breath sounds to auscultation and  No wheeze or crackles HEART/CVS: regular rate & rhythm and no murmurs; No lower extremity edema ABDOMEN:abdomen soft, non-tender and normal bowel sounds Musculoskeletal:no cyanosis of digits and no clubbing  PSYCH: alert & oriented x 3 with fluent speech NEURO: no focal motor/sensory deficits SKIN:  no rashes or significant lesions; mediport- explanted.   LABORATORY DATA:  I have reviewed the data as listed    Component Value Date/Time   NA 140 03/20/2016 1010   NA 142 05/15/2015 1156   NA 139 12/19/2011 1044   K 4.6 03/20/2016 1010   K 4.1  12/19/2011 1044   CL 103 03/20/2016 1010   CL 106 12/19/2011 1044   CO2 29 03/20/2016 1010   CO2 26 12/19/2011 1044   GLUCOSE 128 (H) 03/20/2016 1010   GLUCOSE 95 12/19/2011 1044   BUN 15 03/20/2016 1010   BUN 16 05/15/2015 1156   BUN 18 12/19/2011 1044   CREATININE 1.05 03/20/2016 1010   CREATININE 1.06 12/19/2011 1044   CALCIUM 9.2 03/20/2016 1010   CALCIUM 9.0 12/19/2011 1044   PROT 7.8 03/20/2016 1010   PROT 6.1 05/15/2015 1156   PROT 9.0 (H) 12/19/2011 1044   ALBUMIN 4.3 03/20/2016 1010   ALBUMIN 3.7 05/15/2015 1156   ALBUMIN 4.4 12/19/2011 1044   AST 30 03/20/2016 1010   AST 44 (H) 12/19/2011 1044   ALT 22 03/20/2016 1010   ALT 46  12/19/2011 1044   ALKPHOS 103 03/20/2016 1010   ALKPHOS 110 12/19/2011 1044   BILITOT 0.5 03/20/2016 1010   BILITOT 0.2 05/15/2015 1156   BILITOT 0.7 12/19/2011 1044   GFRNONAA >60 03/20/2016 1010   GFRNONAA >60 12/19/2011 1044   GFRAA >60 03/20/2016 1010   GFRAA >60 12/19/2011 1044    No results found for: SPEP, UPEP  Lab Results  Component Value Date   WBC 8.0 03/20/2016   NEUTROABS 5.4 03/20/2016   HGB 14.2 03/20/2016   HCT 41.8 03/20/2016   MCV 83.5 03/20/2016   PLT 348 03/20/2016      Chemistry      Component Value Date/Time   NA 140 03/20/2016 1010   NA 142 05/15/2015 1156   NA 139 12/19/2011 1044   K 4.6 03/20/2016 1010   K 4.1 12/19/2011 1044   CL 103 03/20/2016 1010   CL 106 12/19/2011 1044   CO2 29 03/20/2016 1010   CO2 26 12/19/2011 1044   BUN 15 03/20/2016 1010   BUN 16 05/15/2015 1156   BUN 18 12/19/2011 1044   CREATININE 1.05 03/20/2016 1010   CREATININE 1.06 12/19/2011 1044      Component Value Date/Time   CALCIUM 9.2 03/20/2016 1010   CALCIUM 9.0 12/19/2011 1044   ALKPHOS 103 03/20/2016 1010   ALKPHOS 110 12/19/2011 1044   AST 30 03/20/2016 1010   AST 44 (H) 12/19/2011 1044   ALT 22 03/20/2016 1010   ALT 46 12/19/2011 1044   BILITOT 0.5 03/20/2016 1010   BILITOT 0.2 05/15/2015 1156   BILITOT  0.7 12/19/2011 1044       ASSESSMENT & PLAN:   Diffuse large b-cell lymphoma, intra-abdominal lymph nodes (HCC) # DLBCL of pancreatic head-  Stage IE. Currently status post 5 cycles of R CHOP chemotherapy.  PET NOV NED.   # Clinically no evidence of recurrence. Educated the patient regarding the signs and symptoms of recurrence.  # Right chest wall pain- likely musculoskeletal- recommend NSAIDs if needed.  On xarelto. Clinically does not appear to be PE.   # follow up in 4 months/labs/ no scan.     Cammie Sickle, MD 03/20/2016 12:25 PM

## 2016-03-31 ENCOUNTER — Other Ambulatory Visit: Payer: Self-pay | Admitting: Internal Medicine

## 2016-04-21 ENCOUNTER — Other Ambulatory Visit: Payer: Self-pay | Admitting: Family Medicine

## 2016-04-21 DIAGNOSIS — E785 Hyperlipidemia, unspecified: Secondary | ICD-10-CM

## 2016-04-22 ENCOUNTER — Other Ambulatory Visit: Payer: Self-pay | Admitting: Family Medicine

## 2016-04-22 DIAGNOSIS — E785 Hyperlipidemia, unspecified: Secondary | ICD-10-CM

## 2016-05-02 ENCOUNTER — Other Ambulatory Visit: Payer: Self-pay | Admitting: Internal Medicine

## 2016-05-02 ENCOUNTER — Other Ambulatory Visit: Payer: Self-pay | Admitting: Cardiovascular Disease

## 2016-05-06 ENCOUNTER — Encounter: Payer: Self-pay | Admitting: Cardiovascular Disease

## 2016-05-06 ENCOUNTER — Ambulatory Visit (INDEPENDENT_AMBULATORY_CARE_PROVIDER_SITE_OTHER): Payer: 59 | Admitting: Cardiovascular Disease

## 2016-05-06 VITALS — BP 132/70 | HR 58 | Ht 67.0 in | Wt 260.5 lb

## 2016-05-06 DIAGNOSIS — I480 Paroxysmal atrial fibrillation: Secondary | ICD-10-CM | POA: Diagnosis not present

## 2016-05-06 DIAGNOSIS — I10 Essential (primary) hypertension: Secondary | ICD-10-CM | POA: Diagnosis not present

## 2016-05-06 NOTE — Progress Notes (Signed)
Cardiology Office Note   Date:  05/06/2016   ID:  Mason Houston, DOB [birth year], MRN [id number]  PCP:  Keith Rake, MD  Cardiologist:   Kathlyn Sacramento, MD   Chief Complaint  Patient presents with  . other    49mo f/u. PT states he has been doing well other than stress. reviewed meds with pt verbally.      History of Present Illness: General Mason Houston is a 66 y.o. male who presents for a followup visit regarding paroxysmal atrial fibrillation.  The patient has a long history of well-controlled hypertension and obesity. He was diagnosed with atrial fibrillation with rapid ventricular response during a routine physical  in December of 2013 in the setting of heavy caffeine intake. He also has sleep apnea currently effectively treated with CPAP.  He had a stroke in August 2015.   He was diagnosed with non-Hodgkin's lymphoma in 2017 and was treated successfully with chemotherapy. Echocardiogram In July 2017 showed normal LV systolic function.  He has been doing very well with no chest pain, shortness of breath or palpitations. He noted bradycardia at home. He has been more active and has been trying to walk 5 miles every day. He started to lose some weight.   Past Medical History:  Diagnosis Date  . Atrial fibrillation ([place])   . Cancer ([place])   . Collagen vascular disease ([place])   . Fibromyalgia   . GERD (gastroesophageal reflux disease)   . H/O Bell's palsy   . History of [place] measles   . Hypertension   . Mini stroke ([place])   . Non Hodgkin's lymphoma ([place]) 2017  . Osteoarthrosis, unspecified whether generalized or localized, lower leg   . Sixth cranial nerve palsy   . Sleep apnea   . Stroke [facility])     Past Surgical History:  Procedure Laterality Date  . COLONOSCOPY  2012   Dr Candace Cruise  . CYST EXCISION    . ERCP N/A 12/29/2014   Procedure: ENDOSCOPIC RETROGRADE CHOLANGIOPANCREATOGRAPHY (ERCP);  Surgeon: Hulen Luster, MD;  Location[facility] ENDOSCOPY;  Service: Endoscopy;  Laterality:  N/A;  . ERCP N/A 02/21/2015   Procedure: ENDOSCOPIC RETROGRADE CHOLANGIOPANCREATOGRAPHY (ERCP);  Surgeon: Hulen Luster, MD;  Location[facility] ENDOSCOPY;  Service: Gastroenterology;  Laterality: N/A;  . ERCP N/A 08/14/2015   Procedure: ENDOSCOPIC RETROGRADE CHOLANGIOPANCREATOGRAPHY (ERCP) Stent removal;  Surgeon: [name], MD;  Location: [facility] ENDOSCOPY;  Service: Endoscopy;  Laterality: N/A;  . PERIPHERAL VASCULAR CATHETERIZATION N/A 01/31/2015   Procedure: Glori Luis Cath Insertion;  Surgeon: Katha Cabal, MD;  Location: [facility];  Service: Cardiovascular;  Laterality: N/A;  . PERIPHERAL VASCULAR CATHETERIZATION N/A 12/18/2015   Procedure: Glori Luis Cath Removal;  Surgeon: Katha Cabal, MD;  Location: [place] CV LAB;  Service: Cardiovascular;  Laterality: N/A;  . TRIGGER FINGER RELEASE       Current Outpatient Prescriptions  Medication Sig Dispense Refill  . acetaminophen (TYLENOL) 650 MG CR tablet Take 1,300 mg by mouth daily.    Marland Kitchen b complex vitamins capsule Take 1 capsule by mouth daily.     Marland Kitchen diltiazem (TIAZAC) 360 MG 24 hr capsule TAKE 1 CAPSULE BY MOUTH EVERY DAY 30 capsule 3  . magnesium oxide (MAG-OX) 400 (241.3 Mg) MG tablet Take 400 mg by mouth daily.     . metoprolol tartrate (LOPRESSOR) 25 MG tablet Take 25 mg by mouth daily.   3  . omeprazole (PRILOSEC) 20 MG capsule TAKE 1 CAPSULE(20 MG) BY MOUTH DAILY  90 capsule 3  . rosuvastatin (CRESTOR) 10 MG tablet Take 1 tablet (10 mg total) by mouth daily. 90 tablet 1  . XARELTO 20 MG TABS tablet TAKE 1 TABLET(20 MG) BY MOUTH DAILY WITH SUPPER 30 tablet 0   No current facility-administered medications for this visit.     Allergies:   Celebrex [celecoxib]; Other; and Penicillins    Social History:  The patient  reports that he has quit smoking. His smoking use included Cigarettes. He has a 67.50 pack-year smoking history. He has never used smokeless tobacco. He reports that he does not drink alcohol or use drugs.    Family History:  The patient's family history includes Heart attack in his father; Hypertension in his mother; Stroke in his father.    ROS:  Please see the history of present illness.   Otherwise, review of systems are positive for none.   All other systems are reviewed and negative.    PHYSICAL EXAM: VS:  BP 132/70 (BP Location: Left Arm, Patient Position: Sitting, Cuff Size: Normal)   Pulse (!) 58   Ht 5\' 7"  (1.702 m)   Wt 260 lb 8 oz (118.2 kg)   BMI 40.80 kg/m  , BMI Body mass index is 40.8 kg/m. GEN: Well nourished, well developed, in no acute distress  HEENT: normal  Neck: no JVD, carotid bruits, or masses Cardiac: RRR; no murmurs, rubs, or gallops, trace leg edema  Respiratory:  clear to auscultation bilaterally, normal work of breathing GI: soft, nontender, nondistended, + BS MS: no deformity or atrophy  Skin: warm and dry, no rash Neuro:  Strength and sensation are intact Psych: euthymic mood, full affect   EKG:  EKG is ordered today. The ekg ordered today demonstrates normal sinus rhythm with no significant ST or T wave changes. Heart rate is 58  bpm.   Recent Labs: 06/26/2015: B Natriuretic Peptide 30.0 07/03/2015: TSH 1.130 03/20/2016: ALT 22; BUN 15; Creatinine, Ser 1.05; Hemoglobin 14.2; Platelets 348; Potassium 4.6; Sodium 140    Lipid Panel    Component Value Date/Time   CHOL 181 07/03/2015 1007   TRIG 74 07/03/2015 1007   HDL 44 07/03/2015 1007   CHOLHDL 4.1 07/03/2015 1007   LDLCALC 122 (H) 07/03/2015 1007      Wt Readings from Last 3 Encounters:  05/06/16 260 lb 8 oz (118.2 kg)  03/20/16 265 lb 2 oz (120.3 kg)  02/06/16 268 lb (121.6 kg)        ASSESSMENT AND PLAN:  1.  Paroxysmal atrial fibrillation: Currently maintaining in sinus rhythm. Continue diltiazem . Given mild bradycardia, I elected to discontinue small dose metoprolol. Continue anticoagulation with Xarelto. I reviewed recent labs which overall were unremarkable.  2.  Essential hypertension: Blood pressure is controlled on current medications.   Disposition:   FU with me in 6 months  Signed,  Kathlyn Sacramento, MD  05/06/2016 2:52 PM    [facility]

## 2016-05-06 NOTE — Patient Instructions (Signed)
Medication Instructions:  Your physician has recommended you make the following change in your medication:  STOP taking metoprolol   Labwork: none  Testing/Procedures: None.  Follow-Up: Your physician wants you to follow-up in: 6 months with Dr. Fletcher Anon.  You will receive a reminder letter in the mail two months in advance. If you don't receive a letter, please call our office to schedule the follow-up appointment.   Any Other Special Instructions Will Be Listed Below (If Applicable).     If you need a refill on your cardiac medications before your next appointment, please call your pharmacy.

## 2016-05-08 ENCOUNTER — Encounter: Payer: Self-pay | Admitting: Family Medicine

## 2016-05-08 ENCOUNTER — Ambulatory Visit (INDEPENDENT_AMBULATORY_CARE_PROVIDER_SITE_OTHER): Payer: 59 | Admitting: Family Medicine

## 2016-05-08 VITALS — BP 140/74 | HR 76 | Temp 98.2°F | Resp 16 | Ht 67.0 in | Wt 263.7 lb

## 2016-05-08 DIAGNOSIS — E785 Hyperlipidemia, unspecified: Secondary | ICD-10-CM

## 2016-05-08 MED ORDER — ROSUVASTATIN CALCIUM 10 MG PO TABS: 10.0000 mg | ORAL_TABLET | Freq: Every day | ORAL | 1 refills | Status: DC

## 2016-05-08 NOTE — Addendum Note (Signed)
Addended by: Zakyia Gagan L on: 05/08/2016 09:16 AM   Modules accepted: Orders  

## 2016-05-08 NOTE — Progress Notes (Signed)
Name: Mason Houston   MRN: [id number]    DOB: 01-22-1950   Date:05/08/2016       Progress Note  Subjective  Chief Complaint  Chief Complaint  Patient presents with  . Medication Refill    Hyperlipidemia  This is a chronic problem. The problem is uncontrolled. Recent lipid tests were reviewed and are high. Exacerbating diseases include obesity. Pertinent negatives include no leg pain, myalgias or shortness of breath. Current antihyperlipidemic treatment includes statins. Risk factors for coronary artery disease include dyslipidemia, stress and obesity.     Past Medical History:  Diagnosis Date  . Atrial fibrillation ([place])   . Cancer ([place])   . Collagen vascular disease ([place])   . Fibromyalgia   . GERD (gastroesophageal reflux disease)   . H/O Bell's palsy   . History of [place] measles   . Hypertension   . Mini stroke ([place])   . Non Hodgkin's lymphoma ([place]) 2017  . Osteoarthrosis, unspecified whether generalized or localized, lower leg   . Sixth cranial nerve palsy   . Sleep apnea   . Stroke [facility])     Past Surgical History:  Procedure Laterality Date  . COLONOSCOPY  2012   Dr Candace Cruise  . CYST EXCISION    . ERCP N/A 12/29/2014   Procedure: ENDOSCOPIC RETROGRADE CHOLANGIOPANCREATOGRAPHY (ERCP);  Surgeon: Hulen Luster, MD;  Location: [facility] ENDOSCOPY;  Service: Endoscopy;  Laterality: N/A;  . ERCP N/A 02/21/2015   Procedure: ENDOSCOPIC RETROGRADE CHOLANGIOPANCREATOGRAPHY (ERCP);  Surgeon: Hulen Luster, MD;  Location: [facility] ENDOSCOPY;  Service: Gastroenterology;  Laterality: N/A;  . ERCP N/A 08/14/2015   Procedure: ENDOSCOPIC RETROGRADE CHOLANGIOPANCREATOGRAPHY (ERCP) Stent removal;  Surgeon: [name], MD;  Location: [facility] ENDOSCOPY;  Service: Endoscopy;  Laterality: N/A;  . PERIPHERAL VASCULAR CATHETERIZATION N/A 01/31/2015   Procedure: Glori Luis Cath Insertion;  Surgeon: Katha Cabal, MD;  Location: [place] CV LAB;  Service: Cardiovascular;  Laterality: N/A;  . PERIPHERAL VASCULAR  CATHETERIZATION N/A 12/18/2015   Procedure: Glori Luis Cath Removal;  Surgeon: Katha Cabal, MD;  Location: [place] CV LAB;  Service: Cardiovascular;  Laterality: N/A;  . TRIGGER FINGER RELEASE      Family History  Problem Relation Age of Onset  . Hypertension Mother   . Heart attack Father   . Stroke Father     Social History   Social History  . Marital status: Married    Spouse name: N/A  . Number of children: N/A  . Years of education: N/A   Occupational History  . Not on file.   Social History Main Topics  . Smoking status: Former Smoker    Packs/day: 4.50    Years: 15.00    Types: Cigarettes  . Smokeless tobacco: Never Used  . Alcohol use No  . Drug use: No     Comment: PAST  . Sexual activity: Not on file   Other Topics Concern  . Not on file   Social History Narrative  . No narrative on file     Current Outpatient Prescriptions:  .  acetaminophen (TYLENOL) 650 MG CR tablet, Take 1,300 mg by mouth daily., Disp: , Rfl:  .  b complex vitamins capsule, Take 1 capsule by mouth daily. , Disp: , Rfl:  .  diltiazem (TIAZAC) 360 MG 24 hr capsule, TAKE 1 CAPSULE BY MOUTH EVERY DAY, Disp: 30 capsule, Rfl: 3 .  magnesium oxide (MAG-OX) 400 (241.3 Mg) MG tablet, Take 400 mg by mouth daily. , Disp: , Rfl:  .  omeprazole (PRILOSEC) 20 MG capsule, TAKE 1 CAPSULE(20 MG) BY MOUTH DAILY, Disp: 90 capsule, Rfl: 3 .  rosuvastatin (CRESTOR) 10 MG tablet, Take 1 tablet (10 mg total) by mouth daily., Disp: 90 tablet, Rfl: 1 .  XARELTO 20 MG TABS tablet, TAKE 1 TABLET(20 MG) BY MOUTH DAILY WITH SUPPER, Disp: 30 tablet, Rfl: 0  Allergies  Allergen Reactions  . Celebrex [Celecoxib] Hives  . Other Itching    FAB LAUNDRY DETERGENT (itching)  . Penicillins Hives and Other (See Comments)    Has patient had a PCN reaction causing immediate rash, facial/tongue/throat swelling, SOB or lightheadedness with hypotension: No Has patient had a PCN reaction causing severe rash  involving mucus membranes or skin necrosis: No Has patient had a PCN reaction that required hospitalization No Has patient had a PCN reaction occurring within the last 10 years: No If all of the above answers are "NO", then may proceed with Cephalosporin use.     Review of Systems  Respiratory: Negative for shortness of breath.   Musculoskeletal: Negative for myalgias.     Objective  Vitals:   05/08/16 0922  BP: 140/74  Pulse: 76  Resp: 16  Temp: 98.2 F (36.8 C)  TempSrc: Oral  SpO2: 96%  Weight: 263 lb 11.2 oz (119.6 kg)  Height: 5\' 7"  (1.702 m)    Physical Exam  Constitutional: He is oriented to person, place, and time and well-developed, well-nourished, and in no distress.  HENT:  Head: Normocephalic and atraumatic.  Cardiovascular: Normal rate, regular rhythm and normal heart sounds.   No murmur heard. Pulmonary/Chest: Effort normal and breath sounds normal. He has no wheezes.  Abdominal: Soft. Bowel sounds are normal. There is no tenderness.  Neurological: He is alert and oriented to person, place, and time.  Psychiatric: Mood, memory, affect and judgment normal.  Nursing note and vitals reviewed.      Assessment & Plan  1. Hyperlipidemia LDL goal <100 Continue rosuvastatin, obtain FLP - rosuvastatin (CRESTOR) 10 MG tablet; Take 1 tablet (10 mg total) by mouth daily.  Dispense: 90 tablet; Refill: 1 - Lipid panel - COMPLETE METABOLIC PANEL WITH GFR    Lin Hackmann [facility] 05/08/2016 9:57 AM

## 2016-05-10 LAB — COMPLETE METABOLIC PANEL WITH GFR
ALT: 16 U/L (ref 9–46)
AST: 22 U/L (ref 10–35)
Albumin: 4 g/dL (ref 3.6–5.1)
Alkaline Phosphatase: 91 U/L (ref 40–115)
BUN: 18 mg/dL (ref 7–25)
CO2: 23 mmol/L (ref 20–31)
Calcium: 9.2 mg/dL (ref 8.6–10.3)
Chloride: 107 mmol/L (ref 98–110)
Creat: 0.88 mg/dL (ref 0.70–1.25)
GFR, Est African American: >89 mL/min (ref >=60)
GFR, Est Non African American: >89 mL/min (ref >=60)
Glucose, Bld: 102 mg/dL — ABNORMAL HIGH (ref 65–99)
Potassium: 4.4 mmol/L (ref 3.5–5.3)
Sodium: 143 mmol/L (ref 135–146)
Total Bilirubin: 0.4 mg/dL (ref 0.2–1.2)
Total Protein: 6.7 g/dL (ref 6.1–8.1)

## 2016-05-10 LAB — LIPID PANEL
Cholesterol: 139 mg/dL (ref <200)
HDL: 42 mg/dL (ref >40)
LDL Cholesterol: 83 mg/dL (ref <100)
Total CHOL/HDL Ratio: 3.3 Ratio (ref <5.0)
Triglycerides: 69 mg/dL (ref <150)
VLDL: 14 mg/dL (ref <30)

## 2016-05-29 ENCOUNTER — Other Ambulatory Visit: Payer: Self-pay | Admitting: Cardiovascular Disease

## 2016-06-04 ENCOUNTER — Other Ambulatory Visit: Payer: PRIVATE HEALTH INSURANCE

## 2016-06-04 ENCOUNTER — Ambulatory Visit: Payer: PRIVATE HEALTH INSURANCE | Admitting: Internal Medicine

## 2016-06-11 ENCOUNTER — Encounter: Payer: 59 | Admitting: Family Medicine

## 2016-07-08 ENCOUNTER — Ambulatory Visit (INDEPENDENT_AMBULATORY_CARE_PROVIDER_SITE_OTHER): Payer: 59 | Admitting: Family Medicine

## 2016-07-08 ENCOUNTER — Encounter: Payer: Self-pay | Admitting: Family Medicine

## 2016-07-08 VITALS — BP 122/68 | HR 79 | Temp 98.3°F | Resp 16 | Ht 67.0 in | Wt 265.0 lb

## 2016-07-08 DIAGNOSIS — Z125 Encounter for screening for malignant neoplasm of prostate: Secondary | ICD-10-CM

## 2016-07-08 DIAGNOSIS — Z0000 Encounter for general adult medical examination without abnormal findings: Secondary | ICD-10-CM

## 2016-07-08 DIAGNOSIS — Z1211 Encounter for screening for malignant neoplasm of colon: Secondary | ICD-10-CM | POA: Diagnosis not present

## 2016-07-08 DIAGNOSIS — R7303 Prediabetes: Secondary | ICD-10-CM | POA: Diagnosis not present

## 2016-07-08 DIAGNOSIS — Z1159 Encounter for screening for other viral diseases: Secondary | ICD-10-CM

## 2016-07-08 LAB — POCT GLYCOSYLATED HEMOGLOBIN (HGB A1C): Hemoglobin A1C: 5

## 2016-07-08 NOTE — Progress Notes (Signed)
Name: Mason Houston   MRN: [id number]    DOB: [birth year]   Date:07/08/2016       Progress Note  Subjective  Chief Complaint  Chief Complaint  Patient presents with  . Annual Exam    CPE    HPI  Pt. Presents for Complete Physical Exam. Last colonoscopy was in April 2014, no records available.  He is due for prostate cancer and Hepatitis C screening.    Past Medical History:  Diagnosis Date  . Atrial fibrillation ([place])   . Cancer ([place])   . Collagen vascular disease ([place])   . Fibromyalgia   . GERD (gastroesophageal reflux disease)   . H/O Bell's palsy   . History of [place] measles   . Hypertension   . Mini stroke ([place])   . Non Hodgkin's lymphoma ([place]) 2017  . Osteoarthrosis, unspecified whether generalized or localized, lower leg   . Sixth cranial nerve palsy   . Sleep apnea   . Stroke [facility])     Past Surgical History:  Procedure Laterality Date  . COLONOSCOPY  2012   Dr Candace Cruise  . CYST EXCISION    . ERCP N/A 12/29/2014   Procedure: ENDOSCOPIC RETROGRADE CHOLANGIOPANCREATOGRAPHY (ERCP);  Surgeon: Hulen Luster, MD;  Location: [facility] ENDOSCOPY;  Service: Endoscopy;  Laterality: N/A;  . ERCP N/A 02/21/2015   Procedure: ENDOSCOPIC RETROGRADE CHOLANGIOPANCREATOGRAPHY (ERCP);  Surgeon: Hulen Luster, MD;  Location: [facility] ENDOSCOPY;  Service: Gastroenterology;  Laterality: N/A;  . ERCP N/A 08/14/2015   Procedure: ENDOSCOPIC RETROGRADE CHOLANGIOPANCREATOGRAPHY (ERCP) Stent removal;  Surgeon: [name], MD;  Location: [facility] ENDOSCOPY;  Service: Endoscopy;  Laterality: N/A;  . PERIPHERAL VASCULAR CATHETERIZATION N/A 01/31/2015   Procedure: Glori Luis Cath Insertion;  Surgeon: Katha Cabal, MD;  Location: [place] CV LAB;  Service: Cardiovascular;  Laterality: N/A;  . PERIPHERAL VASCULAR CATHETERIZATION N/A 12/18/2015   Procedure: Glori Luis Cath Removal;  Surgeon: Katha Cabal, MD;  Location: [place] CV LAB;  Service: Cardiovascular;  Laterality: N/A;  . TRIGGER FINGER RELEASE       Family History  Problem Relation Age of Onset  . Hypertension Mother   . Heart attack Father   . Stroke Father     Social History   Social History  . Marital status: Married    Spouse name: N/A  . Number of children: N/A  . Years of education: N/A   Occupational History  . Not on file.   Social History Main Topics  . Smoking status: Former Smoker    Packs/day: 4.50    Years: 15.00    Types: Cigarettes  . Smokeless tobacco: Never Used  . Alcohol use No  . Drug use: No     Comment: PAST  . Sexual activity: Not on file   Other Topics Concern  . Not on file   Social History Narrative  . No narrative on file     Current Outpatient Prescriptions:  .  acetaminophen (TYLENOL) 650 MG CR tablet, Take 1,300 mg by mouth daily., Disp: , Rfl:  .  b complex vitamins capsule, Take 1 capsule by mouth daily. , Disp: , Rfl:  .  diltiazem (TIAZAC) 360 MG 24 hr capsule, TAKE 1 CAPSULE BY MOUTH EVERY DAY, Disp: 30 capsule, Rfl: 3 .  magnesium oxide (MAG-OX) 400 (241.3 Mg) MG tablet, Take 400 mg by mouth daily. , Disp: , Rfl:  .  omeprazole (PRILOSEC) 20 MG capsule, TAKE 1 CAPSULE(20 MG) BY MOUTH DAILY, Disp: 90 capsule, Rfl: 3 .  rosuvastatin (CRESTOR) 10 MG tablet, Take 1 tablet (10 mg total) by mouth daily., Disp: 90 tablet, Rfl: 1 .  XARELTO 20 MG TABS tablet, TAKE 1 TABLET(20 MG) BY MOUTH DAILY WITH SUPPER, Disp: 30 tablet, Rfl: 2  Allergies  Allergen Reactions  . Celebrex [Celecoxib] Hives  . Other Itching    FAB LAUNDRY DETERGENT (itching)  . Penicillins Hives and Other (See Comments)    Has patient had a PCN reaction causing immediate rash, facial/tongue/throat swelling, SOB or lightheadedness with hypotension: No Has patient had a PCN reaction causing severe rash involving mucus membranes or skin necrosis: No Has patient had a PCN reaction that required hospitalization No Has patient had a PCN reaction occurring within the last 10 years: No If all of the above answers  are "NO", then may proceed with Cephalosporin use.     Review of Systems  Constitutional: Negative for chills, fever and malaise/fatigue.  HENT: Negative for congestion, ear pain and sore throat.   Eyes: Negative for blurred vision and double vision.  Respiratory: Positive for cough. Negative for shortness of breath.   Cardiovascular: Negative for chest pain and leg swelling (occasional swelling in legs in the evening).  Gastrointestinal: Negative for abdominal pain, blood in stool, diarrhea, nausea and vomiting.  Genitourinary: Negative for dysuria and hematuria.  Musculoskeletal: Negative for back pain, joint pain and neck pain.  Neurological: Negative for dizziness and headaches.  Psychiatric/Behavioral: Negative for depression. The patient is not nervous/anxious and does not have insomnia.      Objective  Vitals:   07/08/16 1107  BP: 122/68  Pulse: 79  Resp: 16  Temp: 98.3 F (36.8 C)  TempSrc: Oral  SpO2: 96%  Weight: 265 lb (120.2 kg)  Height: 5\' 7"  (1.702 m)    Physical Exam  Constitutional: He is oriented to person, place, and time and well-developed, well-nourished, and in no distress.  HENT:  Head: Normocephalic and atraumatic.  Right Ear: External ear normal.  Left Ear: External ear normal.  Mouth/Throat: Oropharynx is clear and moist.  Neck: Neck supple.  Cardiovascular: Normal rate, regular rhythm and normal heart sounds.   No murmur heard. Pulmonary/Chest: Effort normal and breath sounds normal. He has no wheezes.  Abdominal: Soft. Bowel sounds are normal. There is no tenderness.  Genitourinary: Prostate normal.  Musculoskeletal: He exhibits edema.       Right ankle: He exhibits swelling.       Left ankle: He exhibits swelling.  Neurological: He is alert and oriented to person, place, and time.  Psychiatric: Mood, memory, affect and judgment normal.  Nursing note and vitals reviewed.      Assessment & Plan  1. Annual physical exam Obtain  age-appropriate laboratory screening - TSH - VITAMIN D 25 Hydroxy (Vit-D Deficiency, Fractures)  2. Screening for colon cancer  - Cologuard  3. Screening for prostate cancer  - PSA  4. Need for hepatitis C screening test  - Hepatitis C antibody  5. Prediabetes Point-of-care A1c is 5.0%, this is significantly improved from 6.0% from October 2017, patient is considered within the normal range - POCT HgB A1C   Mason Houston [facility] 07/08/2016 11:19 AM

## 2016-07-08 NOTE — Telephone Encounter (Signed)
done

## 2016-07-09 LAB — PSA: PSA: 0.4 ng/mL (ref <=4.0)

## 2016-07-09 LAB — HEPATITIS C ANTIBODY: HCV Ab: NEGATIVE

## 2016-07-09 LAB — VITAMIN D 25 HYDROXY (VIT D DEFICIENCY, FRACTURES): Vit D, 25-Hydroxy: 32 ng/mL (ref 30–100)

## 2016-07-09 LAB — TSH: TSH: 0.99 mIU/L (ref 0.40–4.50)

## 2016-07-21 ENCOUNTER — Inpatient Hospital Stay: Payer: 59 | Admitting: Internal Medicine

## 2016-07-21 ENCOUNTER — Inpatient Hospital Stay: Payer: 59

## 2016-07-22 ENCOUNTER — Telehealth: Payer: Self-pay | Admitting: *Deleted

## 2016-07-22 ENCOUNTER — Inpatient Hospital Stay: Payer: 59 | Attending: Internal Medicine

## 2016-07-22 ENCOUNTER — Inpatient Hospital Stay (HOSPITAL_BASED_OUTPATIENT_CLINIC_OR_DEPARTMENT_OTHER): Payer: 59 | Admitting: Internal Medicine

## 2016-07-22 VITALS — BP 172/93 | HR 69 | Temp 97.6°F | Resp 20 | Ht 67.0 in | Wt 266.8 lb

## 2016-07-22 DIAGNOSIS — Z8572 Personal history of non-Hodgkin lymphomas: Secondary | ICD-10-CM | POA: Diagnosis not present

## 2016-07-22 DIAGNOSIS — I4891 Unspecified atrial fibrillation: Secondary | ICD-10-CM | POA: Diagnosis not present

## 2016-07-22 DIAGNOSIS — M1990 Unspecified osteoarthritis, unspecified site: Secondary | ICD-10-CM

## 2016-07-22 DIAGNOSIS — E119 Type 2 diabetes mellitus without complications: Secondary | ICD-10-CM

## 2016-07-22 DIAGNOSIS — H532 Diplopia: Secondary | ICD-10-CM | POA: Insufficient documentation

## 2016-07-22 DIAGNOSIS — M797 Fibromyalgia: Secondary | ICD-10-CM | POA: Diagnosis not present

## 2016-07-22 DIAGNOSIS — K219 Gastro-esophageal reflux disease without esophagitis: Secondary | ICD-10-CM

## 2016-07-22 DIAGNOSIS — H4920 Sixth [abducent] nerve palsy, unspecified eye: Secondary | ICD-10-CM | POA: Diagnosis not present

## 2016-07-22 DIAGNOSIS — Z8673 Personal history of transient ischemic attack (TIA), and cerebral infarction without residual deficits: Secondary | ICD-10-CM | POA: Diagnosis not present

## 2016-07-22 DIAGNOSIS — Z87891 Personal history of nicotine dependence: Secondary | ICD-10-CM | POA: Diagnosis not present

## 2016-07-22 DIAGNOSIS — Z79899 Other long term (current) drug therapy: Secondary | ICD-10-CM | POA: Diagnosis not present

## 2016-07-22 DIAGNOSIS — Z9221 Personal history of antineoplastic chemotherapy: Secondary | ICD-10-CM | POA: Insufficient documentation

## 2016-07-22 DIAGNOSIS — I998 Other disorder of circulatory system: Secondary | ICD-10-CM | POA: Insufficient documentation

## 2016-07-22 DIAGNOSIS — I10 Essential (primary) hypertension: Secondary | ICD-10-CM | POA: Diagnosis not present

## 2016-07-22 DIAGNOSIS — Z7901 Long term (current) use of anticoagulants: Secondary | ICD-10-CM | POA: Diagnosis not present

## 2016-07-22 DIAGNOSIS — Z8781 Personal history of (healed) traumatic fracture: Secondary | ICD-10-CM

## 2016-07-22 DIAGNOSIS — C8333 Diffuse large B-cell lymphoma, intra-abdominal lymph nodes: Secondary | ICD-10-CM

## 2016-07-22 LAB — CBC WITH DIFFERENTIAL/PLATELET
Basophils Absolute: 0.1 10*3/uL (ref 0–0.1)
Basophils Relative: 1 %
Eosinophils Absolute: 0.4 10*3/uL (ref 0–0.7)
Eosinophils Relative: 5 %
HCT: 41 % (ref 40.0–52.0)
Hemoglobin: 13.6 g/dL (ref 13.0–18.0)
Lymphocytes Relative: 27 %
Lymphs Abs: 2.2 10*3/uL (ref 1.0–3.6)
MCH: 27.9 pg (ref 26.0–34.0)
MCHC: 33.2 g/dL (ref 32.0–36.0)
MCV: 83.8 fL (ref 80.0–100.0)
Monocytes Absolute: 0.9 10*3/uL (ref 0.2–1.0)
Monocytes Relative: 11 %
Neutro Abs: 4.7 10*3/uL (ref 1.4–6.5)
Neutrophils Relative %: 56 %
Platelets: 342 10*3/uL (ref 150–440)
RBC: 4.9 MIL/uL (ref 4.40–5.90)
RDW: 13.8 % (ref 11.5–14.5)
WBC: 8.1 10*3/uL (ref 3.8–10.6)

## 2016-07-22 LAB — COMPREHENSIVE METABOLIC PANEL
ALT: 22 U/L (ref 17–63)
AST: 28 U/L (ref 15–41)
Albumin: 4.2 g/dL (ref 3.5–5.0)
Alkaline Phosphatase: 99 U/L (ref 38–126)
Anion gap: 8 (ref 5–15)
BUN: 18 mg/dL (ref 6–20)
CO2: 30 mmol/L (ref 22–32)
Calcium: 9.3 mg/dL (ref 8.9–10.3)
Chloride: 101 mmol/L (ref 101–111)
Creatinine, Ser: 1.1 mg/dL (ref 0.61–1.24)
GFR calc Af Amer: >60 mL/min (ref >60)
GFR calc non Af Amer: >60 mL/min (ref >60)
Glucose, Bld: 133 mg/dL — ABNORMAL HIGH (ref 65–99)
Potassium: 4.5 mmol/L (ref 3.5–5.1)
Sodium: 139 mmol/L (ref 135–145)
Total Bilirubin: 0.6 mg/dL (ref 0.3–1.2)
Total Protein: 7.8 g/dL (ref 6.5–8.1)

## 2016-07-22 LAB — LACTATE DEHYDROGENASE: LDH: 153 U/L (ref 98–192)

## 2016-07-22 NOTE — Telephone Encounter (Signed)
Phone call returned to patient. Explained to patient that his glucose was abn today. Sugar has been high in the past. Pt states that he didn't eat breakfast today. Pt states his last hgb a1c was normal. I asked pt to address this with his pcp.

## 2016-07-22 NOTE — Telephone Encounter (Signed)
-----   Message from Secundino Ginger sent at 07/22/2016 11:11 AM EDT ----- Regarding: question about lab Contact: [phone] Pt called and said one of his labs was highlighted "Glucose" and he wants to know what that means?

## 2016-07-22 NOTE — Assessment & Plan Note (Addendum)
#   DLBCL of pancreatic head-  Stage IE. Currently status post 5 cycles of R CHOP chemotherapy.  PET NOV NED.   # Clinically no evidence of recurrence. Educated the patient regarding the signs and symptoms of recurrence.  # Right chest wall pain- likely musculoskeletal- resolved.  # Elevated Blood Pressure-170s/90s- pressures at home normal.   # Vision issues/ double vision- s/p opthal eval   # follow up in 4 months/labs/ no scan.

## 2016-07-22 NOTE — Progress Notes (Signed)
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 A.fib- off xarelto [mild blood in urine]     Diffuse large b-cell lymphoma, intra-abdominal lymph nodes (HCC)     INTERVAL HISTORY:  66 year-old  Male patient with above history of diffuse large B-cell lymphoma stage IE peripancreatic region s/p R-CHOP summer 2017Is here for follow-up.   Patient has been working to get his diabetes under control; states his hemoglobin A1c has improved to 5.  Recently noted to have double vision/history of 6 nerve palsy- improved/ status post ophthalmology evaluation. Denies any headaches. No abdominal pain no nausea no vomiting.  REVIEW OF SYSTEMS:  A complete 10 point review of system is done which is negative except mentioned above/history of present illness.   PAST MEDICAL HISTORY :  Past Medical History:  Diagnosis Date  . Atrial fibrillation ([place])   . Cancer ([place])   . Collagen vascular disease ([place])   . Fibromyalgia   . GERD (gastroesophageal reflux disease)   . H/O Bell's palsy   . History of [place] measles   . Hypertension   . Mini stroke ([place])   . Non Hodgkin's lymphoma ([place]) 2017  . Osteoarthrosis, unspecified whether generalized or localized, lower leg   . Sixth  cranial nerve palsy   . Sleep apnea   . Stroke [facility])     PAST SURGICAL HISTORY :   Past Surgical History:  Procedure Laterality Date  . COLONOSCOPY  2012   Dr Candace Cruise  . CYST EXCISION    . ERCP N/A 12/29/2014   Procedure: ENDOSCOPIC RETROGRADE CHOLANGIOPANCREATOGRAPHY (ERCP);  Surgeon: Hulen Luster, MD;  Location: [facility] ENDOSCOPY;  Service: Endoscopy;  Laterality: N/A;  . ERCP N/A 02/21/2015   Procedure: ENDOSCOPIC RETROGRADE CHOLANGIOPANCREATOGRAPHY (ERCP);  Surgeon: Hulen Luster, MD;  Location: [facility] ENDOSCOPY;  Service: Gastroenterology;  Laterality: N/A;  . ERCP N/A 08/14/2015   Procedure: ENDOSCOPIC RETROGRADE CHOLANGIOPANCREATOGRAPHY (ERCP) Stent removal;  Surgeon: [name], MD;  Location: [facility] ENDOSCOPY;  Service: Endoscopy;  Laterality: N/A;  . PERIPHERAL VASCULAR CATHETERIZATION N/A 01/31/2015   Procedure: Glori Luis Cath Insertion;  Surgeon: Katha Cabal, MD;  Location: [place] CV LAB;  Service: Cardiovascular;  Laterality: N/A;  . PERIPHERAL VASCULAR CATHETERIZATION N/A 12/18/2015   Procedure: Glori Luis Cath Removal;  Surgeon: Katha Cabal, MD;  Location: [place] CV LAB;  Service: Cardiovascular;  Laterality: N/A;  . TRIGGER FINGER RELEASE      FAMILY HISTORY :   Family History  Problem Relation Age of Onset  . Hypertension Mother   . Heart attack Father   . Stroke Father     SOCIAL HISTORY:   Social History  Substance Use Topics  . Smoking status: Former Smoker    Packs/day: 4.50    Years: 15.00  Types: Cigarettes  . Smokeless tobacco: Never Used  . Alcohol use No    ALLERGIES:  is allergic to celebrex [celecoxib]; other; and penicillins.  MEDICATIONS:  Current Outpatient Prescriptions  Medication Sig Dispense Refill  . acetaminophen (TYLENOL) 650 MG CR tablet Take 1,300 mg by mouth daily.    Marland Kitchen b complex vitamins capsule Take 1 capsule by mouth daily.     Marland Kitchen diltiazem (TIAZAC) 360 MG 24 hr capsule TAKE 1 CAPSULE BY MOUTH EVERY DAY 30 capsule 3  .  magnesium oxide (MAG-OX) 400 (241.3 Mg) MG tablet Take 400 mg by mouth daily.     Marland Kitchen omeprazole (PRILOSEC) 20 MG capsule TAKE 1 CAPSULE(20 MG) BY MOUTH DAILY 90 capsule 3  . rosuvastatin (CRESTOR) 10 MG tablet Take 1 tablet (10 mg total) by mouth daily. 90 tablet 1  . XARELTO 20 MG TABS tablet TAKE 1 TABLET(20 MG) BY MOUTH DAILY WITH SUPPER 30 tablet 2   No current facility-administered medications for this visit.     PHYSICAL EXAMINATION: ECOG PERFORMANCE STATUS: 0 - Asymptomatic  BP (!) 172/93 (BP Location: Left Arm, Patient Position: Sitting)   Pulse 69   Temp 97.6 F (36.4 C) (Tympanic)   Resp 20   Ht 5\' 7"  (1.702 m)   Wt 266 lb 12.1 oz (121 kg)   BMI 41.78 kg/m   Filed Weights   07/22/16 0845  Weight: 266 lb 12.1 oz (121 kg)    GENERAL: Well-nourished well-developed; Alert, no distress and comfortable. He is alone. EYES: no pallor or icterus OROPHARYNX: no thrush or ulceration; good dentition  NECK: supple, no masses felt LYMPH:  no palpable lymphadenopathy in the cervical, axillary or inguinal regions LUNGS: decreased breath sounds to auscultation and  No wheeze or crackles HEART/CVS: regular rate & rhythm and no murmurs; No lower extremity edema ABDOMEN:abdomen soft, non-tender and normal bowel sounds Musculoskeletal:no cyanosis of digits and no clubbing  PSYCH: alert & oriented x 3 with fluent speech NEURO: no focal motor/sensory deficits SKIN:  no rashes or significant lesions; mediport- explanted.   LABORATORY DATA:  I have reviewed the data as listed    Component Value Date/Time   NA 139 07/22/2016 0810   NA 142 05/15/2015 1156   NA 139 12/19/2011 1044   K 4.5 07/22/2016 0810   K 4.1 12/19/2011 1044   CL 101 07/22/2016 0810   CL 106 12/19/2011 1044   CO2 30 07/22/2016 0810   CO2 26 12/19/2011 1044   GLUCOSE 133 (H) 07/22/2016 0810   GLUCOSE 95 12/19/2011 1044   BUN 18 07/22/2016 0810   BUN 16 05/15/2015 1156   BUN 18 12/19/2011 1044   CREATININE 1.10  07/22/2016 0810   CREATININE 0.88 05/08/2016 1152   CALCIUM 9.3 07/22/2016 0810   CALCIUM 9.0 12/19/2011 1044   PROT 7.8 07/22/2016 0810   PROT 6.1 05/15/2015 1156   PROT 9.0 (H) 12/19/2011 1044   ALBUMIN 4.2 07/22/2016 0810   ALBUMIN 3.7 05/15/2015 1156   ALBUMIN 4.4 12/19/2011 1044   AST 28 07/22/2016 0810   AST 44 (H) 12/19/2011 1044   ALT 22 07/22/2016 0810   ALT 46 12/19/2011 1044   ALKPHOS 99 07/22/2016 0810   ALKPHOS 110 12/19/2011 1044   BILITOT 0.6 07/22/2016 0810   BILITOT 0.2 05/15/2015 1156   BILITOT 0.7 12/19/2011 1044   GFRNONAA >60 07/22/2016 0810   GFRNONAA >89 05/08/2016 1152   GFRAA >60 07/22/2016 0810   GFRAA >89 05/08/2016 1152  No results found for: SPEP, UPEP  Lab Results  Component Value Date   WBC 8.1 07/22/2016   NEUTROABS 4.7 07/22/2016   HGB 13.6 07/22/2016   HCT 41.0 07/22/2016   MCV 83.8 07/22/2016   PLT 342 07/22/2016      Chemistry      Component Value Date/Time   NA 139 07/22/2016 0810   NA 142 05/15/2015 1156   NA 139 12/19/2011 1044   K 4.5 07/22/2016 0810   K 4.1 12/19/2011 1044   CL 101 07/22/2016 0810   CL 106 12/19/2011 1044   CO2 30 07/22/2016 0810   CO2 26 12/19/2011 1044   BUN 18 07/22/2016 0810   BUN 16 05/15/2015 1156   BUN 18 12/19/2011 1044   CREATININE 1.10 07/22/2016 0810   CREATININE 0.88 05/08/2016 1152      Component Value Date/Time   CALCIUM 9.3 07/22/2016 0810   CALCIUM 9.0 12/19/2011 1044   ALKPHOS 99 07/22/2016 0810   ALKPHOS 110 12/19/2011 1044   AST 28 07/22/2016 0810   AST 44 (H) 12/19/2011 1044   ALT 22 07/22/2016 0810   ALT 46 12/19/2011 1044   BILITOT 0.6 07/22/2016 0810   BILITOT 0.2 05/15/2015 1156   BILITOT 0.7 12/19/2011 1044       ASSESSMENT & PLAN:   Diffuse large b-cell lymphoma, intra-abdominal lymph nodes (HCC) # DLBCL of pancreatic head-  Stage IE. Currently status post 5 cycles of R CHOP chemotherapy.  PET NOV NED.   # Clinically no evidence of recurrence. Educated the  patient regarding the signs and symptoms of recurrence.  # Right chest wall pain- likely musculoskeletal- resolved.  # Elevated Blood Pressure-170s/90s- pressures at home normal.   # Vision issues/ double vision- s/p opthal eval   # follow up in 4 months/labs/ no scan.     Cammie Sickle, MD 07/22/2016 9:37 AM

## 2016-07-25 LAB — COLOGUARD: Cologuard: NEGATIVE

## 2016-08-05 ENCOUNTER — Other Ambulatory Visit: Payer: Self-pay | Admitting: Cardiovascular Disease

## 2016-09-01 ENCOUNTER — Other Ambulatory Visit: Payer: Self-pay | Admitting: Cardiovascular Disease

## 2016-10-08 ENCOUNTER — Ambulatory Visit: Payer: 59 | Admitting: Family Medicine

## 2016-10-17 ENCOUNTER — Ambulatory Visit (INDEPENDENT_AMBULATORY_CARE_PROVIDER_SITE_OTHER): Payer: 59

## 2016-10-17 DIAGNOSIS — Z23 Encounter for immunization: Secondary | ICD-10-CM

## 2016-10-31 ENCOUNTER — Other Ambulatory Visit: Payer: Self-pay | Admitting: Family Medicine

## 2016-10-31 DIAGNOSIS — E785 Hyperlipidemia, unspecified: Secondary | ICD-10-CM

## 2016-11-04 ENCOUNTER — Encounter: Payer: Self-pay | Admitting: Cardiovascular Disease

## 2016-11-04 ENCOUNTER — Ambulatory Visit (INDEPENDENT_AMBULATORY_CARE_PROVIDER_SITE_OTHER): Payer: 59 | Admitting: Cardiovascular Disease

## 2016-11-04 VITALS — BP 160/68 | HR 87 | Ht 67.0 in | Wt 262.8 lb

## 2016-11-04 DIAGNOSIS — I480 Paroxysmal atrial fibrillation: Secondary | ICD-10-CM

## 2016-11-04 DIAGNOSIS — I10 Essential (primary) hypertension: Secondary | ICD-10-CM | POA: Diagnosis not present

## 2016-11-04 MED ORDER — LOSARTAN POTASSIUM 50 MG PO TABS: 50.0000 mg | ORAL_TABLET | Freq: Every day | ORAL | 3 refills | Status: DC

## 2016-11-04 NOTE — Progress Notes (Signed)
Cardiology Office Note   Date:  11/04/2016   ID:  Mason Houston, DOB Sep 19, 1950, MRN [id number]  PCP:  Roselee Nova, MD  Cardiologist:   Kathlyn Sacramento, MD   Chief Complaint  Patient presents with  . other    6 month follow up. Meds reviewed by the pt. verbally. "doing well."       History of Present Illness: Mason Houston is a 66 y.o. male who presents for a followup visit regarding paroxysmal atrial fibrillation.  The patient has a long history of well-controlled hypertension and obesity. He was diagnosed with atrial fibrillation with rapid ventricular response during a routine physical  in December of 2013 in the setting of heavy caffeine intake. He also has sleep apnea currently effectively treated with CPAP.  He had a stroke in August 2015.   He was diagnosed with non-Hodgkin's lymphoma in 2017 and was treated successfully with chemotherapy. Echocardiogram In July 2017 showed normal LV systolic function.   He has been doing reasonably well with no chest pain, shortness of breath or palpitations.  He is noted to be in atrial fibrillation today with controlled ventricular rate.  He does not appear to be having symptoms related to this.  His blood pressure has been running high.  He reports being under stress at home.  Past Medical History:  Diagnosis Date  . Atrial fibrillation ([place])   . Cancer ([place])   . Collagen vascular disease ([place])   . Fibromyalgia   . GERD (gastroesophageal reflux disease)   . H/O Bell's palsy   . History of [place] measles   . Hypertension   . Mini stroke ([place])   . Non Hodgkin's lymphoma ([place]) 2017  . Osteoarthrosis, unspecified whether generalized or localized, lower leg   . Sixth cranial nerve palsy   . Sleep apnea   . Stroke [facility])     Past Surgical History:  Procedure Laterality Date  . COLONOSCOPY  2012   Dr Candace Cruise  . CYST EXCISION    . ERCP N/A 12/29/2014   Procedure: ENDOSCOPIC RETROGRADE CHOLANGIOPANCREATOGRAPHY (ERCP);   Surgeon: Hulen Luster, MD;  Location: [facility] ENDOSCOPY;  Service: Endoscopy;  Laterality: N/A;  . ERCP N/A 02/21/2015   Procedure: ENDOSCOPIC RETROGRADE CHOLANGIOPANCREATOGRAPHY (ERCP);  Surgeon: Hulen Luster, MD;  Location: [facility] ENDOSCOPY;  Service: Gastroenterology;  Laterality: N/A;  . ERCP N/A 08/14/2015   Procedure: ENDOSCOPIC RETROGRADE CHOLANGIOPANCREATOGRAPHY (ERCP) Stent removal;  Surgeon: [name], MD;  Location: [facility] ENDOSCOPY;  Service: Endoscopy;  Laterality: N/A;  . PERIPHERAL VASCULAR CATHETERIZATION N/A 01/31/2015   Procedure: Glori Luis Cath Insertion;  Surgeon: Katha Cabal, MD;  Location: [place] CV LAB;  Service: Cardiovascular;  Laterality: N/A;  . PERIPHERAL VASCULAR CATHETERIZATION N/A 12/18/2015   Procedure: Glori Luis Cath Removal;  Surgeon: Katha Cabal, MD;  Location: [place] CV LAB;  Service: Cardiovascular;  Laterality: N/A;  . TRIGGER FINGER RELEASE       Current Outpatient Prescriptions  Medication Sig Dispense Refill  . acetaminophen (TYLENOL) 650 MG CR tablet Take 1,300 mg by mouth daily.    . Ascorbic Acid (VITAMIN C) 1000 MG tablet Take 1,000 mg by mouth daily.    Marland Kitchen b complex vitamins capsule Take 1 capsule by mouth daily.     . cholecalciferol (VITAMIN D) 1000 units tablet Take 1,000 Units by mouth daily.    Marland Kitchen diltiazem (TIAZAC) 360 MG 24 hr capsule TAKE 1 CAPSULE BY MOUTH EVERY DAY 30 capsule 3  . magnesium  oxide (MAG-OX) 400 (241.3 Mg) MG tablet Take 400 mg by mouth daily.     Marland Kitchen omeprazole (PRILOSEC) 20 MG capsule TAKE 1 CAPSULE(20 MG) BY MOUTH DAILY 90 capsule 3  . rosuvastatin (CRESTOR) 10 MG tablet TAKE 1 TABLET(10 MG) BY MOUTH DAILY 90 tablet 0  . XARELTO 20 MG TABS tablet TAKE 1 TABLET(20 MG) BY MOUTH DAILY WITH SUPPER 30 tablet 3   No current facility-administered medications for this visit.     Allergies:   Celebrex [celecoxib]; Other; and Penicillins    Social History:  The patient  reports that he has quit smoking. His smoking use  included Cigarettes. He has a 67.50 pack-year smoking history. He has never used smokeless tobacco. He reports that he does not drink alcohol or use drugs.   Family History:  The patient's family history includes Heart attack in his father; Hypertension in his mother; Stroke in his father.    ROS:  Please see the history of present illness.   Otherwise, review of systems are positive for none.   All other systems are reviewed and negative.    PHYSICAL EXAM: VS:  BP (!) 160/68 (BP Location: Left Arm, Patient Position: Sitting, Cuff Size: Large)   Pulse 87   Ht 5\' 7"  (1.702 m)   Wt 262 lb 12 oz (119.2 kg)   BMI 41.15 kg/m  , BMI Body mass index is 41.15 kg/m. GEN: Well nourished, well developed, in no acute distress  HEENT: normal  Neck: no JVD, carotid bruits, or masses Cardiac: Irregularly irregular; no murmurs, rubs, or gallops, trace leg edema  Respiratory:  clear to auscultation bilaterally, normal work of breathing GI: soft, nontender, nondistended, + BS MS: no deformity or atrophy  Skin: warm and dry, no rash Neuro:  Strength and sensation are intact Psych: euthymic mood, full affect   EKG:  EKG is ordered today. The ekg ordered today demonstrates atrial fibrillation with incomplete right bundle branch block.   Recent Labs: 07/08/2016: TSH 0.99 07/22/2016: ALT 22; BUN 18; Creatinine, Ser 1.10; Hemoglobin 13.6; Platelets 342; Potassium 4.5; Sodium 139    Lipid Panel    Component Value Date/Time   CHOL 139 05/08/2016 1152   CHOL 181 07/03/2015 1007   TRIG 69 05/08/2016 1152   HDL 42 05/08/2016 1152   HDL 44 07/03/2015 1007   CHOLHDL 3.3 05/08/2016 1152   VLDL 14 05/08/2016 1152   LDLCALC 83 05/08/2016 1152   LDLCALC 122 (H) 07/03/2015 1007      Wt Readings from Last 3 Encounters:  11/04/16 262 lb 12 oz (119.2 kg)  07/22/16 266 lb 12.1 oz (121 kg)  07/08/16 265 lb (120.2 kg)        ASSESSMENT AND PLAN:  1.  Paroxysmal atrial fibrillation: Patient is noted  to be in atrial fibrillation today.  However, he does not appear to be symptomatic.  Ventricular rate is controlled.  Continue diltiazem.  Continue long-term anticoagulation with Xarelto.  2. Essential hypertension: Blood pressure has been elevated lately.  Stress might be contributing.  I elected to add losartan 50 mg once daily.  Check basic metabolic profile in 1 week.   Disposition:   FU with me in 6 months  Signed,  Kathlyn Sacramento, MD  11/04/2016 3:23 PM    [facility]

## 2016-11-04 NOTE — Patient Instructions (Signed)
Medication Instructions:  Your physician has recommended you make the following change in your medication:  START taking losartan 50mg  once daily   Labwork: BMET in one week at the [facility] lab. No appt needed.  Testing/Procedures: none  Follow-Up: Your physician wants you to follow-up in: 6 months with Dr. Fletcher Anon.  You will receive a reminder letter in the mail two months in advance. If you don't receive a letter, please call our office to schedule the follow-up appointment.   Any Other Special Instructions Will Be Listed Below (If Applicable).     If you need a refill on your cardiac medications before your next appointment, please call your pharmacy.

## 2016-11-10 ENCOUNTER — Ambulatory Visit: Payer: 59 | Admitting: Family Medicine

## 2016-11-10 ENCOUNTER — Encounter: Payer: Self-pay | Admitting: Family Medicine

## 2016-11-10 VITALS — BP 142/76 | HR 71 | Temp 98.1°F | Resp 16 | Ht 67.0 in | Wt 267.8 lb

## 2016-11-10 DIAGNOSIS — E118 Type 2 diabetes mellitus with unspecified complications: Secondary | ICD-10-CM | POA: Diagnosis not present

## 2016-11-10 DIAGNOSIS — E785 Hyperlipidemia, unspecified: Secondary | ICD-10-CM

## 2016-11-10 DIAGNOSIS — I10 Essential (primary) hypertension: Secondary | ICD-10-CM

## 2016-11-10 NOTE — Progress Notes (Signed)
Name: Mason Houston   MRN: [id number]    DOB: 01/17/1950   Date:11/10/2016       Progress Note  Subjective  Chief Complaint  Chief Complaint  Patient presents with  . Follow-up    82mthns     Hyperlipidemia  This is a chronic problem. The problem is controlled. Recent lipid tests were reviewed and are normal. Pertinent negatives include no chest pain, leg pain, myalgias or shortness of breath. Current antihyperlipidemic treatment includes statins.  Hypertension  This is a chronic problem. The problem has been gradually worsening since onset. The problem is uncontrolled (seen by Cardiology, started on Losartan 50 mg, BP still elevated, based on readings). Pertinent negatives include no blurred vision, chest pain, headaches, palpitations or shortness of breath. Past treatments include angiotensin blockers. There is no history of kidney disease, CAD/MI or CVA.     Past Medical History:  Diagnosis Date  . Atrial fibrillation ([place])   . Cancer ([place])   . Collagen vascular disease ([place])   . Fibromyalgia   . GERD (gastroesophageal reflux disease)   . H/O Bell's palsy   . History of [place] measles   . Hypertension   . Mini stroke ([place])   . Non Hodgkin's lymphoma ([place]) 2017  . Osteoarthrosis, unspecified whether generalized or localized, lower leg   . Sixth cranial nerve palsy   . Sleep apnea   . Stroke [facility])     Past Surgical History:  Procedure Laterality Date  . COLONOSCOPY  2012   Dr Candace Cruise  . CYST EXCISION    . TRIGGER FINGER RELEASE      Family History  Problem Relation Age of Onset  . Hypertension Mother   . Heart attack Father   . Stroke Father     Social History   Socioeconomic History  . Marital status: Married    Spouse name: Not on file  . Number of children: Not on file  . Years of education: Not on file  . Highest education level: Not on file  Social Needs  . Financial resource strain: Not on file  . Food insecurity - worry: Not on file  . Food  insecurity - inability: Not on file  . Transportation needs - medical: Not on file  . Transportation needs - non-medical: Not on file  Occupational History  . Not on file  Tobacco Use  . Smoking status: Former Smoker    Packs/day: 4.50    Years: 15.00    Pack years: 67.50    Types: Cigarettes  . Smokeless tobacco: Never Used  Substance and Sexual Activity  . Alcohol use: No  . Drug use: No    Comment: PAST  . Sexual activity: Not on file  Other Topics Concern  . Not on file  Social History Narrative  . Not on file     Current Outpatient Medications:  .  acetaminophen (TYLENOL) 650 MG CR tablet, Take 1,300 mg by mouth daily., Disp: , Rfl:  .  Ascorbic Acid (VITAMIN C) 1000 MG tablet, Take 1,000 mg by mouth daily., Disp: , Rfl:  .  b complex vitamins capsule, Take 1 capsule by mouth daily. , Disp: , Rfl:  .  cholecalciferol (VITAMIN D) 1000 units tablet, Take 1,000 Units by mouth daily., Disp: , Rfl:  .  diltiazem (TIAZAC) 360 MG 24 hr capsule, TAKE 1 CAPSULE BY MOUTH EVERY DAY, Disp: 30 capsule, Rfl: 3 .  losartan (COZAAR) 50 MG tablet, Take 1 tablet (50 mg total) by  mouth daily., Disp: 90 tablet, Rfl: 3 .  magnesium oxide (MAG-OX) 400 (241.3 Mg) MG tablet, Take 250 mg 2 (two) times daily by mouth. , Disp: , Rfl:  .  omeprazole (PRILOSEC) 20 MG capsule, TAKE 1 CAPSULE(20 MG) BY MOUTH DAILY, Disp: 90 capsule, Rfl: 3 .  rosuvastatin (CRESTOR) 10 MG tablet, TAKE 1 TABLET(10 MG) BY MOUTH DAILY, Disp: 90 tablet, Rfl: 0 .  XARELTO 20 MG TABS tablet, TAKE 1 TABLET(20 MG) BY MOUTH DAILY WITH SUPPER, Disp: 30 tablet, Rfl: 3  Allergies  Allergen Reactions  . Celebrex [Celecoxib] Hives  . Other Itching    FAB LAUNDRY DETERGENT (itching)  . Penicillins Hives and Other (See Comments)    Has patient had a PCN reaction causing immediate rash, facial/tongue/throat swelling, SOB or lightheadedness with hypotension: No Has patient had a PCN reaction causing severe rash involving mucus  membranes or skin necrosis: No Has patient had a PCN reaction that required hospitalization No Has patient had a PCN reaction occurring within the last 10 years: No If all of the above answers are "NO", then may proceed with Cephalosporin use.     Review of Systems  Eyes: Negative for blurred vision.  Respiratory: Negative for shortness of breath.   Cardiovascular: Negative for chest pain and palpitations.  Musculoskeletal: Negative for myalgias.  Neurological: Negative for headaches.     Objective  Vitals:   11/10/16 0854 11/10/16 0859  BP: (!) 146/74 (!) 142/76  Pulse: 71   Resp: 16   Temp: 98.1 F (36.7 C)   TempSrc: Oral   SpO2: 94%   Weight: 267 lb 12.8 oz (121.5 kg)   Height: 5\' 7"  (1.702 m)     Physical Exam  Constitutional: He is oriented to person, place, and time and well-developed, well-nourished, and in no distress.  HENT:  Head: Normocephalic and atraumatic.  Cardiovascular: Normal rate, regular rhythm and normal heart sounds.  No murmur heard. Pulmonary/Chest: Effort normal and breath sounds normal. He has no wheezes.  Abdominal: Soft. Bowel sounds are normal. There is no tenderness.  Musculoskeletal: He exhibits edema.  Neurological: He is alert and oriented to person, place, and time.  Psychiatric: Mood, memory, affect and judgment normal.  Nursing note and vitals reviewed.       Assessment & Plan  1. Type 2 diabetes mellitus with complication, unspecified whether long term insulin use (HCC) Pt. has steadily improved his diet and lifestyle factors, he wants to skip A1c for this visit, will recheck in 3 months.  2. Benign essential HTN BP elevated on office reading, similarly elevated on the BP machine, he was asked to follow up with Cardiology for BP measurement.   3. Hyperlipidemia LDL goal <100 FLP at goal from May 2018, continue on statin.    Graden Hoshino [facility] 11/10/2016 9:21 AM

## 2016-11-11 ENCOUNTER — Other Ambulatory Visit
Admission: RE | Admit: 2016-11-11 | Discharge: 2016-11-11 | Disposition: A | Discharge status: Home / Self Care | Payer: 59 | Source: Ambulatory Visit | Attending: Cardiovascular Disease | Admitting: Cardiovascular Disease

## 2016-11-11 DIAGNOSIS — I10 Essential (primary) hypertension: Secondary | ICD-10-CM | POA: Diagnosis present

## 2016-11-11 LAB — BASIC METABOLIC PANEL
Anion gap: 9 (ref 5–15)
BUN: 21 mg/dL — ABNORMAL HIGH (ref 6–20)
CO2: 26 mmol/L (ref 22–32)
Calcium: 8.7 mg/dL — ABNORMAL LOW (ref 8.9–10.3)
Chloride: 102 mmol/L (ref 101–111)
Creatinine, Ser: 0.96 mg/dL (ref 0.61–1.24)
GFR calc Af Amer: >60 mL/min (ref >60)
GFR calc non Af Amer: >60 mL/min (ref >60)
Glucose, Bld: 160 mg/dL — ABNORMAL HIGH (ref 65–99)
Potassium: 4 mmol/L (ref 3.5–5.1)
Sodium: 137 mmol/L (ref 135–145)

## 2016-11-12 ENCOUNTER — Telehealth: Payer: Self-pay | Admitting: Cardiovascular Disease

## 2016-11-12 NOTE — Telephone Encounter (Signed)
Pt has a question regarding his BP medication Losartan 50 mg. States he notices 1st thing in the morning and at evening it is high. Please call.

## 2016-11-13 NOTE — Telephone Encounter (Signed)
Pt reports elevated BP for the past few weeks.  Losartan 50mg  added back at 10/30 OV. BMET earlier this week. Potassium 4.0 Pt use to take losartan 100mg  qd but experienced hypotension after cancer diagnosis and during treatment thus it was d/c'd. He has been closely monitoring pressures since restarting losartan, sometimes checking it every 15 minutes. He admits to being under a great deal of stress for the past 4 months and is also anxious about BP.  SBP 120s-upper 130s 1-1.5 hours after taking 11am losartan. BP 150s/80s later in the day and first thing in the AM. We discussed how stress plays a role in elevated BP and the need to limit number of times he takes his BP.  HR upper 80s. Dr. Manuella Ghazi felt it was acceptable to increase losartan to 100mg  per day if needed but advised to get cardiology approval. .  Advised pt to check BP only 2-3 times per day and try to relax and avoid stressful situations.  He will relax this afternoon and will not take BP again until 4-5pm.  He understands to take one additional losartan (total 100mg ) today if needed and will await further instruction from MD.  I will call him back later this afternoon to see if BP has improved.

## 2016-11-13 NOTE — Telephone Encounter (Signed)
Pt calling back, he wants to take an additional amount of medication  Blood pressure is still too high  Please call to advise

## 2016-11-14 NOTE — Telephone Encounter (Signed)
Called pt this morning.  He reports BPs last evening 115/68, HR 63;  123/63, HR 61. He did not take extra dose of losartan.  He took losartan 50mg  at 9am and will recheck BP in a few hours.  Pt will monitor for one week and call if BP is consistently elevated.

## 2016-11-25 ENCOUNTER — Inpatient Hospital Stay: Payer: 59 | Attending: Internal Medicine | Admitting: Internal Medicine

## 2016-11-25 ENCOUNTER — Encounter: Payer: Self-pay | Admitting: Internal Medicine

## 2016-11-25 ENCOUNTER — Inpatient Hospital Stay: Payer: 59

## 2016-11-25 ENCOUNTER — Other Ambulatory Visit: Payer: Self-pay

## 2016-11-25 VITALS — BP 132/76 | HR 62 | Temp 97.8°F | Resp 20

## 2016-11-25 DIAGNOSIS — K219 Gastro-esophageal reflux disease without esophagitis: Secondary | ICD-10-CM | POA: Insufficient documentation

## 2016-11-25 DIAGNOSIS — Z7901 Long term (current) use of anticoagulants: Secondary | ICD-10-CM | POA: Diagnosis not present

## 2016-11-25 DIAGNOSIS — I10 Essential (primary) hypertension: Secondary | ICD-10-CM | POA: Insufficient documentation

## 2016-11-25 DIAGNOSIS — J029 Acute pharyngitis, unspecified: Secondary | ICD-10-CM | POA: Diagnosis not present

## 2016-11-25 DIAGNOSIS — Z79899 Other long term (current) drug therapy: Secondary | ICD-10-CM | POA: Diagnosis not present

## 2016-11-25 DIAGNOSIS — Z87891 Personal history of nicotine dependence: Secondary | ICD-10-CM | POA: Insufficient documentation

## 2016-11-25 DIAGNOSIS — G4730 Sleep apnea, unspecified: Secondary | ICD-10-CM | POA: Diagnosis not present

## 2016-11-25 DIAGNOSIS — K831 Obstruction of bile duct: Secondary | ICD-10-CM | POA: Insufficient documentation

## 2016-11-25 DIAGNOSIS — Z8673 Personal history of transient ischemic attack (TIA), and cerebral infarction without residual deficits: Secondary | ICD-10-CM | POA: Diagnosis not present

## 2016-11-25 DIAGNOSIS — C8333 Diffuse large B-cell lymphoma, intra-abdominal lymph nodes: Secondary | ICD-10-CM

## 2016-11-25 DIAGNOSIS — M797 Fibromyalgia: Secondary | ICD-10-CM | POA: Insufficient documentation

## 2016-11-25 DIAGNOSIS — I480 Paroxysmal atrial fibrillation: Secondary | ICD-10-CM | POA: Diagnosis not present

## 2016-11-25 LAB — COMPREHENSIVE METABOLIC PANEL
ALT: 27 U/L (ref 17–63)
AST: 29 U/L (ref 15–41)
Albumin: 4 g/dL (ref 3.5–5.0)
Alkaline Phosphatase: 104 U/L (ref 38–126)
Anion gap: 7 (ref 5–15)
BUN: 21 mg/dL — ABNORMAL HIGH (ref 6–20)
CO2: 28 mmol/L (ref 22–32)
Calcium: 9.1 mg/dL (ref 8.9–10.3)
Chloride: 104 mmol/L (ref 101–111)
Creatinine, Ser: 0.95 mg/dL (ref 0.61–1.24)
GFR calc Af Amer: >60 mL/min (ref >60)
GFR calc non Af Amer: >60 mL/min (ref >60)
Glucose, Bld: 137 mg/dL — ABNORMAL HIGH (ref 65–99)
Potassium: 4.7 mmol/L (ref 3.5–5.1)
Sodium: 139 mmol/L (ref 135–145)
Total Bilirubin: 0.3 mg/dL (ref 0.3–1.2)
Total Protein: 7.8 g/dL (ref 6.5–8.1)

## 2016-11-25 LAB — CBC WITH DIFFERENTIAL/PLATELET
Basophils Absolute: 0.1 10*3/uL (ref 0–0.1)
Basophils Relative: 1 %
Eosinophils Absolute: 0.4 10*3/uL (ref 0–0.7)
Eosinophils Relative: 6 %
HCT: 41.8 % (ref 40.0–52.0)
Hemoglobin: 13.9 g/dL (ref 13.0–18.0)
Lymphocytes Relative: 27 %
Lymphs Abs: 1.7 10*3/uL (ref 1.0–3.6)
MCH: 27.7 pg (ref 26.0–34.0)
MCHC: 33.1 g/dL (ref 32.0–36.0)
MCV: 83.6 fL (ref 80.0–100.0)
Monocytes Absolute: 1 10*3/uL (ref 0.2–1.0)
Monocytes Relative: 16 %
Neutro Abs: 3.3 10*3/uL (ref 1.4–6.5)
Neutrophils Relative %: 50 %
Platelets: 301 10*3/uL (ref 150–440)
RBC: 5.01 MIL/uL (ref 4.40–5.90)
RDW: 15.2 % — ABNORMAL HIGH (ref 11.5–14.5)
WBC: 6.5 10*3/uL (ref 3.8–10.6)

## 2016-11-25 LAB — LACTATE DEHYDROGENASE: LDH: 130 U/L (ref 98–192)

## 2016-11-25 NOTE — Progress Notes (Signed)
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 A.fib- off xarelto [mild blood in urine]     Diffuse large b-cell lymphoma, intra-abdominal lymph nodes (HCC)     INTERVAL HISTORY:  66 year-old  Male patient with above history of diffuse large B-cell lymphoma stage IE peripancreatic region s/p R-CHOP summer 2017 is here for follow-up.   In the interim patient had his port taken out.  Patient had couple of mechanical falls.  No trauma to head.  Patient states that he had contact with his grandchildren-who had been recently diagnosed with strep throat.  Denies any guilty swallowing or pain with swallowing or throat pain.  No abdominal pain no nausea no vomiting.  He is concerned about his elevated blood pressure; recently started back on blood pressure medications.  He continues to be in A. Fib; currently on Eliquis.  REVIEW OF SYSTEMS:  A complete 10 point review of system is done which is negative except mentioned above/history of present illness.   PAST MEDICAL HISTORY :  Past Medical History:  Diagnosis Date  . Atrial fibrillation ([place])   . Cancer ([place])   . Collagen vascular disease ([place])   . Fibromyalgia   . GERD (gastroesophageal reflux disease)   . H/O  Bell's palsy   . History of [place] measles   . Hypertension   . Mini stroke ([place])   . Non Hodgkin's lymphoma ([place]) 2017  . Osteoarthrosis, unspecified whether generalized or localized, lower leg   . Sixth cranial nerve palsy   . Sleep apnea   . Stroke [facility])     PAST SURGICAL HISTORY :   Past Surgical History:  Procedure Laterality Date  . COLONOSCOPY  2012   Dr Candace Cruise  . CYST EXCISION    . ERCP N/A 12/29/2014   Procedure: ENDOSCOPIC RETROGRADE CHOLANGIOPANCREATOGRAPHY (ERCP);  Surgeon: Hulen Luster, MD;  Location: [facility] ENDOSCOPY;  Service: Endoscopy;  Laterality: N/A;  . ERCP N/A 02/21/2015   Procedure: ENDOSCOPIC RETROGRADE CHOLANGIOPANCREATOGRAPHY (ERCP);  Surgeon: Hulen Luster, MD;  Location: [facility] ENDOSCOPY;  Service: Gastroenterology;  Laterality: N/A;  . ERCP N/A 08/14/2015   Procedure: ENDOSCOPIC RETROGRADE CHOLANGIOPANCREATOGRAPHY (ERCP) Stent removal;  Surgeon: [name], MD;  Location: [facility] ENDOSCOPY;  Service: Endoscopy;  Laterality: N/A;  . PERIPHERAL VASCULAR CATHETERIZATION N/A 01/31/2015   Procedure: Glori Luis Cath Insertion;  Surgeon: Katha Cabal, MD;  Location: [place] CV LAB;  Service: Cardiovascular;  Laterality: N/A;  . PERIPHERAL VASCULAR CATHETERIZATION N/A 12/18/2015   Procedure: Glori Luis Cath Removal;  Surgeon: Katha Cabal, MD;  Location: [place] CV LAB;  Service: Cardiovascular;  Laterality: N/A;  . TRIGGER FINGER RELEASE      FAMILY HISTORY :   Family History  Problem Relation Age of Onset  . Hypertension Mother   .  Heart attack Father   . Stroke Father     SOCIAL HISTORY:   Social History   Tobacco Use  . Smoking status: Former Smoker    Packs/day: 4.50    Years: 15.00    Pack years: 67.50    Types: Cigarettes  . Smokeless tobacco: Never Used  Substance Use Topics  . Alcohol use: No  . Drug use: No    Comment: PAST    ALLERGIES:  is allergic to celebrex [celecoxib]; other; and penicillins.  MEDICATIONS:  Current Outpatient  Medications  Medication Sig Dispense Refill  . Ascorbic Acid (VITAMIN C) 1000 MG tablet Take 1,000 mg by mouth daily.    Marland Kitchen b complex vitamins capsule Take 1 capsule by mouth daily.     . cholecalciferol (VITAMIN D) 1000 units tablet Take 1,000 Units by mouth daily.    Marland Kitchen diltiazem (TIAZAC) 360 MG 24 hr capsule TAKE 1 CAPSULE BY MOUTH EVERY DAY 30 capsule 3  . losartan (COZAAR) 50 MG tablet Take 1 tablet (50 mg total) by mouth daily. 90 tablet 3  . magnesium oxide (MAG-OX) 400 (241.3 Mg) MG tablet Take 250 mg daily by mouth.     Marland Kitchen omeprazole (PRILOSEC) 20 MG capsule TAKE 1 CAPSULE(20 MG) BY MOUTH DAILY 90 capsule 3  . rosuvastatin (CRESTOR) 10 MG tablet TAKE 1 TABLET(10 MG) BY MOUTH DAILY 90 tablet 0  . XARELTO 20 MG TABS tablet TAKE 1 TABLET(20 MG) BY MOUTH DAILY WITH SUPPER 30 tablet 3  . acetaminophen (TYLENOL) 650 MG CR tablet Take 1,300 mg by mouth daily.     No current facility-administered medications for this visit.     PHYSICAL EXAMINATION: ECOG PERFORMANCE STATUS: 0 - Asymptomatic  BP 132/76   Pulse 62   Temp 97.8 F (36.6 C) (Tympanic)   Resp 20   There were no vitals filed for this visit.  GENERAL: Well-nourished well-developed; Alert, no distress and comfortable. He is alone. EYES: no pallor or icterus OROPHARYNX: no thrush or ulceration; good dentition  NECK: supple, no masses felt LYMPH:  no palpable lymphadenopathy in the cervical, axillary or inguinal regions LUNGS: decreased breath sounds to auscultation and  No wheeze or crackles HEART/CVS: regular rate & rhythm and no murmurs; No lower extremity edema ABDOMEN:abdomen soft, non-tender and normal bowel sounds Musculoskeletal:no cyanosis of digits and no clubbing  PSYCH: alert & oriented x 3 with fluent speech NEURO: no focal motor/sensory deficits SKIN:  no rashes or significant lesions; mediport- explanted.   LABORATORY DATA:  I have reviewed the data as listed    Component Value Date/Time   NA 139  11/25/2016 0857   NA 142 05/15/2015 1156   NA 139 12/19/2011 1044   K 4.7 11/25/2016 0857   K 4.1 12/19/2011 1044   CL 104 11/25/2016 0857   CL 106 12/19/2011 1044   CO2 28 11/25/2016 0857   CO2 26 12/19/2011 1044   GLUCOSE 137 (H) 11/25/2016 0857   GLUCOSE 95 12/19/2011 1044   BUN 21 (H) 11/25/2016 0857   BUN 16 05/15/2015 1156   BUN 18 12/19/2011 1044   CREATININE 0.95 11/25/2016 0857   CREATININE 0.88 05/08/2016 1152   CALCIUM 9.1 11/25/2016 0857   CALCIUM 9.0 12/19/2011 1044   PROT 7.8 11/25/2016 0857   PROT 6.1 05/15/2015 1156   PROT 9.0 (H) 12/19/2011 1044   ALBUMIN 4.0 11/25/2016 0857   ALBUMIN 3.7 05/15/2015 1156   ALBUMIN 4.4 12/19/2011 1044   AST 29 11/25/2016 0857  AST 44 (H) 12/19/2011 1044   ALT 27 11/25/2016 0857   ALT 46 12/19/2011 1044   ALKPHOS 104 11/25/2016 0857   ALKPHOS 110 12/19/2011 1044   BILITOT 0.3 11/25/2016 0857   BILITOT 0.2 05/15/2015 1156   BILITOT 0.7 12/19/2011 1044   GFRNONAA >60 11/25/2016 0857   GFRNONAA >89 05/08/2016 1152   GFRAA >60 11/25/2016 0857   GFRAA >89 05/08/2016 1152    No results found for: SPEP, UPEP  Lab Results  Component Value Date   WBC 6.5 11/25/2016   NEUTROABS 3.3 11/25/2016   HGB 13.9 11/25/2016   HCT 41.8 11/25/2016   MCV 83.6 11/25/2016   PLT 301 11/25/2016      Chemistry      Component Value Date/Time   NA 139 11/25/2016 0857   NA 142 05/15/2015 1156   NA 139 12/19/2011 1044   K 4.7 11/25/2016 0857   K 4.1 12/19/2011 1044   CL 104 11/25/2016 0857   CL 106 12/19/2011 1044   CO2 28 11/25/2016 0857   CO2 26 12/19/2011 1044   BUN 21 (H) 11/25/2016 0857   BUN 16 05/15/2015 1156   BUN 18 12/19/2011 1044   CREATININE 0.95 11/25/2016 0857   CREATININE 0.88 05/08/2016 1152      Component Value Date/Time   CALCIUM 9.1 11/25/2016 0857   CALCIUM 9.0 12/19/2011 1044   ALKPHOS 104 11/25/2016 0857   ALKPHOS 110 12/19/2011 1044   AST 29 11/25/2016 0857   AST 44 (H) 12/19/2011 1044   ALT 27  11/25/2016 0857   ALT 46 12/19/2011 1044   BILITOT 0.3 11/25/2016 0857   BILITOT 0.2 05/15/2015 1156   BILITOT 0.7 12/19/2011 1044       ASSESSMENT & PLAN:   Diffuse large b-cell lymphoma, intra-abdominal lymph nodes (HCC) # DLBCL of pancreatic head-  Stage IE. Currently status post 5 cycles of R CHOP chemotherapy.  PET NOV 2017 NED.   # Clinically no evidence of recurrence. Educated the patient regarding the signs and symptoms of recurrence. Labs- reviewed- WNL.   # Elevated Blood Pressure-170s/90s; improved. On anti-HTN  # Paroxysmal A.fib- on xarelto. Discussed re: bleeding/falls.   # mild sore throat- recent exposure to Sterp- no signs of infection. To call us if symptoms get worse.   # follow up in 6 months/labs.   # 25 minutes face-to-face with the patient discussing the above plan of care; more than 50% of time spent on prognosis/ natural history; counseling and coordination.    Cammie Sickle, MD 11/27/2016 4:48 AM

## 2016-11-25 NOTE — Assessment & Plan Note (Addendum)
#   DLBCL of pancreatic head-  Stage IE. Currently status post 5 cycles of R CHOP chemotherapy.  PET NOV 2017 NED.   # Clinically no evidence of recurrence. Educated the patient regarding the signs and symptoms of recurrence. Labs- reviewed- WNL.   # Elevated Blood Pressure-170s/90s; improved. On anti-HTN  # Paroxysmal A.fib- on xarelto. Discussed re: bleeding/falls.   # mild sore throat- recent exposure to Sterp- no signs of infection. To call us if symptoms get worse.   # follow up in 6 months/labs.   # 25 minutes face-to-face with the patient discussing the above plan of care; more than 50% of time spent on prognosis/ natural history; counseling and coordination.

## 2016-11-25 NOTE — Progress Notes (Signed)
Patient here for follow-up for lymphoma and pet scan results. Patient has mutiple concerns. He was exposed to strep throat. He reports that his grandchildren both have strep. Pt requesting an antibiotic today before leaving. Pt states, "I don't want to have to go to Urgent Care. I need an antibiotic so I don't have to deal with this over Thanksgiving." patient reports that he has a sore throat. There are no visible white patches on his throat. Mild redness in back of throat is noted. He has no fever or chills. I asked the patient if has as used any over the counter interventions: salt walter gargles or lozengers to improve symptoms. He stated that he had not at this time and reiterated that he needed an antibiotic. I explained to the pt that I would let the doctor know his concerns. We discussed that the doctor can not provide an antibiotic w/o confirming a strep dx. RN discussed pt's concerns about anibiotics resistance with patient. He gave verbal understanding. Dr. Rogue Bussing.  Pt also states that he recently broke 2 fingers on his hands and these areas are contributing to joint stiffness and "never healed after breaking them."  He also states that his afib is being well controlled by current medications prescribed by cardiology.

## 2016-11-26 ENCOUNTER — Other Ambulatory Visit: Payer: Self-pay | Admitting: Cardiovascular Disease

## 2016-12-02 ENCOUNTER — Other Ambulatory Visit: Payer: Self-pay | Admitting: Cardiovascular Disease

## 2016-12-03 ENCOUNTER — Other Ambulatory Visit: Payer: Self-pay | Admitting: *Deleted

## 2016-12-03 ENCOUNTER — Telehealth: Payer: Self-pay | Admitting: Cardiovascular Disease

## 2016-12-03 MED ORDER — RIVAROXABAN 20 MG PO TABS: ORAL_TABLET | ORAL | 6 refills | Status: DC

## 2016-12-03 MED ORDER — DILTIAZEM HCL ER BEADS 360 MG PO CP24: ORAL_CAPSULE | ORAL | 3 refills | Status: DC

## 2016-12-03 NOTE — Telephone Encounter (Signed)
Requested Prescriptions   Signed Prescriptions Disp Refills  . diltiazem (TIAZAC) 360 MG 24 hr capsule 90 capsule 3    Sig: TAKE 1 CAPSULE BY MOUTH EVERY DAY    Authorizing Provider: Kathlyn Sacramento A    Ordering User: Britt Bottom

## 2016-12-03 NOTE — Telephone Encounter (Signed)
°*  STAT* If patient is at the pharmacy, call can be transferred to refill team.   1. Which medications need to be refilled? (please list name of each medication and dose if known)  xarelto and Diltiazem   2. Which pharmacy/location (including street and city if local pharmacy) is medication to be sent to? [facility] in [place]   3. Do they need a 30 day or 90 day supply? 90 day

## 2016-12-03 NOTE — Telephone Encounter (Signed)
Please see note for Xarelto refill Rx.

## 2016-12-03 NOTE — Telephone Encounter (Signed)
Please review for refill, Thanks !  

## 2016-12-03 NOTE — Telephone Encounter (Signed)
Xarelto refilled.

## 2016-12-26 ENCOUNTER — Telehealth: Payer: Self-pay | Admitting: Cardiovascular Disease

## 2016-12-26 ENCOUNTER — Other Ambulatory Visit: Payer: Self-pay

## 2016-12-26 MED ORDER — RIVAROXABAN 20 MG PO TABS: ORAL_TABLET | ORAL | 0 refills | Status: DC

## 2016-12-26 NOTE — Telephone Encounter (Signed)
°*  STAT* If patient is at the pharmacy, call can be transferred to refill team.   1. Which medications need to be refilled? (please list name of each medication and dose if known) xarelto   2. Which pharmacy/location (including street and city if local pharmacy) is medication to be sent to? [facility] in [place]   3. Do they need a 30 day or 90 day supply? 90 day    Pt called stating he's now received medicare and they are going to cover medication if we send it in for 90 day   Please advise.

## 2016-12-26 NOTE — Telephone Encounter (Signed)
Requested Prescriptions   Signed Prescriptions Disp Refills  . rivaroxaban (XARELTO) 20 MG TABS tablet 90 tablet 0    Sig: TAKE 1 TABLET(20 MG) BY MOUTH DAILY WITH SUPPER    Authorizing Provider: Kathlyn Sacramento A    Ordering User: Janan Ridge

## 2017-01-14 ENCOUNTER — Ambulatory Visit (INDEPENDENT_AMBULATORY_CARE_PROVIDER_SITE_OTHER): Payer: PPO

## 2017-01-14 DIAGNOSIS — G4733 Obstructive sleep apnea (adult) (pediatric): Secondary | ICD-10-CM | POA: Diagnosis not present

## 2017-01-19 ENCOUNTER — Ambulatory Visit: Payer: PPO | Admitting: Internal Medicine

## 2017-01-19 ENCOUNTER — Encounter: Payer: Self-pay | Admitting: Internal Medicine

## 2017-01-19 ENCOUNTER — Ambulatory Visit (INDEPENDENT_AMBULATORY_CARE_PROVIDER_SITE_OTHER): Payer: PPO | Admitting: Family Medicine

## 2017-01-19 ENCOUNTER — Encounter: Payer: Self-pay | Admitting: Family Medicine

## 2017-01-19 VITALS — BP 136/64 | HR 70 | Temp 98.0°F | Resp 16 | Wt 274.0 lb

## 2017-01-19 VITALS — BP 144/69 | HR 65 | Resp 16 | Ht 67.0 in | Wt 277.0 lb

## 2017-01-19 DIAGNOSIS — M25562 Pain in left knee: Secondary | ICD-10-CM

## 2017-01-19 DIAGNOSIS — Z9989 Dependence on other enabling machines and devices: Secondary | ICD-10-CM

## 2017-01-19 DIAGNOSIS — I482 Chronic atrial fibrillation, unspecified: Secondary | ICD-10-CM

## 2017-01-19 DIAGNOSIS — S90221A Contusion of right lesser toe(s) with damage to nail, initial encounter: Secondary | ICD-10-CM | POA: Diagnosis not present

## 2017-01-19 DIAGNOSIS — G4733 Obstructive sleep apnea (adult) (pediatric): Secondary | ICD-10-CM | POA: Diagnosis not present

## 2017-01-19 DIAGNOSIS — C8590 Non-Hodgkin lymphoma, unspecified, unspecified site: Secondary | ICD-10-CM

## 2017-01-19 NOTE — Progress Notes (Signed)
[facility] [address] [place], [place] [address]  Pulmonary Sleep Medicine  Office Visit Note  Patient Name: Mason Houston DOB: [birth year] MRN [id number]  Date of Service: 01/19/2017     Complaints/HPI: He is doing well. He has been using his CPAP as prescribed. Has had some issues with stress and states that he is trying to deal with that. He is also trying to exercise. Patient also states his sinus issues have improved since he get a CPAP cleaner device  Current Medication: Outpatient Encounter Medications as of 01/19/2017  Medication Sig  . acetaminophen (TYLENOL) 650 MG CR tablet Take 1,300 mg by mouth daily.  . Ascorbic Acid (VITAMIN C) 1000 MG tablet Take 1,000 mg by mouth daily.  Marland Kitchen aspirin EC 325 MG tablet Take 325 mg by mouth daily.  Marland Kitchen b complex vitamins capsule Take 1 capsule by mouth daily.   . baclofen (LIORESAL) 10 MG tablet Take 10 mg by mouth daily as needed for muscle spasms.  . cholecalciferol (VITAMIN D) 1000 units tablet Take 1,000 Units by mouth daily.  Marland Kitchen diltiazem (TIAZAC) 360 MG 24 hr capsule TAKE 1 CAPSULE BY MOUTH EVERY DAY  . fluticasone (FLONASE) 50 MCG/ACT nasal spray Place 1 spray into both nostrils daily.  Marland Kitchen loratadine (CLARITIN) 10 MG tablet Take 10 mg by mouth daily.  Marland Kitchen losartan (COZAAR) 50 MG tablet Take 1 tablet (50 mg total) by mouth daily.  . magnesium oxide (MAG-OX) 400 (241.3 Mg) MG tablet Take 250 mg daily by mouth.   . Melatonin 5 MG TABS Take by mouth as needed.  Marland Kitchen omeprazole (PRILOSEC) 20 MG capsule TAKE 1 CAPSULE(20 MG) BY MOUTH DAILY  . rivaroxaban (XARELTO) 20 MG TABS tablet TAKE 1 TABLET(20 MG) BY MOUTH DAILY WITH SUPPER  . rosuvastatin (CRESTOR) 10 MG tablet TAKE 1 TABLET(10 MG) BY MOUTH DAILY   No facility-administered encounter medications on file as of 01/19/2017.     Surgical History: Past Surgical History:  Procedure Laterality Date  . COLONOSCOPY  2012   Dr Candace Cruise  . CYST EXCISION    . ERCP N/A 12/29/2014    Procedure: ENDOSCOPIC RETROGRADE CHOLANGIOPANCREATOGRAPHY (ERCP);  Surgeon: Hulen Luster, MD;  Location: [facility] ENDOSCOPY;  Service: Endoscopy;  Laterality: N/A;  . ERCP N/A 02/21/2015   Procedure: ENDOSCOPIC RETROGRADE CHOLANGIOPANCREATOGRAPHY (ERCP);  Surgeon: Hulen Luster, MD;  Location: [facility] ENDOSCOPY;  Service: Gastroenterology;  Laterality: N/A;  . ERCP N/A 08/14/2015   Procedure: ENDOSCOPIC RETROGRADE CHOLANGIOPANCREATOGRAPHY (ERCP) Stent removal;  Surgeon: [name], MD;  Location: [facility] ENDOSCOPY;  Service: Endoscopy;  Laterality: N/A;  . PERIPHERAL VASCULAR CATHETERIZATION N/A 01/31/2015   Procedure: Glori Luis Cath Insertion;  Surgeon: Katha Cabal, MD;  Location: [place] CV LAB;  Service: Cardiovascular;  Laterality: N/A;  . PERIPHERAL VASCULAR CATHETERIZATION N/A 12/18/2015   Procedure: Glori Luis Cath Removal;  Surgeon: Katha Cabal, MD;  Location: [place] CV LAB;  Service: Cardiovascular;  Laterality: N/A;  . TRIGGER FINGER RELEASE      Medical History: Past Medical History:  Diagnosis Date  . Atrial fibrillation ([place])   . Cancer ([place])   . Collagen vascular disease ([place])   . Fibromyalgia   . GERD (gastroesophageal reflux disease)   . H/O Bell's palsy   . History of [place] measles   . Hypertension   . Mini stroke ([place])   . Non Hodgkin's lymphoma ([place]) 2017  . Osteoarthrosis, unspecified whether generalized or localized, lower leg   . Sixth cranial nerve palsy   .  Sleep apnea   . Stroke [facility])     Family History: Family History  Problem Relation Age of Onset  . Hypertension Mother   . Heart attack Father   . Stroke Father     Social History: Social History   Socioeconomic History  . Marital status: Married    Spouse name: Not on file  . Number of children: Not on file  . Years of education: Not on file  . Highest education level: Not on file  Social Needs  . Financial resource strain: Not on file  . Food insecurity - worry: Not on file  . Food  insecurity - inability: Not on file  . Transportation needs - medical: Not on file  . Transportation needs - non-medical: Not on file  Occupational History  . Not on file  Tobacco Use  . Smoking status: Former Smoker    Packs/day: 4.50    Years: 15.00    Pack years: 67.50    Types: Cigarettes  . Smokeless tobacco: Never Used  Substance and Sexual Activity  . Alcohol use: No  . Drug use: No    Comment: PAST  . Sexual activity: Not on file  Other Topics Concern  . Not on file  Social History Narrative  . Not on file     ROS  General: (-) fever, (-) chills, (-) night sweats, (-) weakness, (-) changes in appetite. Skin: (-) rashes, (-) itching,. Eyes: (-) visual changes, (-) redness, (-) itching, (-) double or blurred vision. Nose and Sinuses: (-) nasal stuffiness or itchiness, (-) postnasal drip, (-) nosebleeds, (-) sinus trouble. Mouth and Throat: (-) sore throat, (-) hoarseness. Neck: (-) swollen glands, (-) enlarged thyroid, (-) neck pain. Respiratory: (-) cough, (-) bloody sputum, (-) shortness of breath, (-) wheezing. Cardiovascular: (-) ankle swelling, (-) chest pain. Lymphatic: (-) lymph node enlargement, (-) lymph node tenderness. Neurologic: (-) numbness, (-) tingling,(-) dizziness. Psychiatric: (-) anxiety, (-) depression.  Vital Signs: Blood pressure (!) 144/69, pulse 65, resp. rate 16, height 5' 7"  (1.702 m), weight 277 lb (125.6 kg), SpO2 96 %.  Examination: General Appearance: The patient is well-developed, well-nourished, and in no distress. Skin: Gross inspection of skin demonstrates no evidence of abnormality. Head: Patient's head is normocephalic, no gross deformities. Eyes: no gross deformities noted. ENT: ears appear grossly normal. Nasopharynx appears to be normal. Neck: Supple. No thyromegaly. No LAD. Respiratory: Lungs are clear to auscultation with no adventitious sounds. Cardiovascular: Normal S1 and S2 without murmur or rub. Extremities: No  cyanosis. pulses are equal. Neurologic: Alert and oriented. No involuntary movements.  LABS: Recent Results (from the past 2160 hour(s))  Basic Metabolic Panel (BMET)     Status: Abnormal   Collection Time: 11/11/16 10:09 AM  Result Value Ref Range   Sodium 137 135 - 145 mmol/L   Potassium 4.0 3.5 - 5.1 mmol/L   Chloride 102 101 - 111 mmol/L   CO2 26 22 - 32 mmol/L   Glucose, Bld 160 (H) 65 - 99 mg/dL   BUN 21 (H) 6 - 20 mg/dL   Creatinine, Ser 0.96 0.61 - 1.24 mg/dL   Calcium 8.7 (L) 8.9 - 10.3 mg/dL   GFR calc non Af Amer >60 >60 mL/min   GFR calc Af Amer >60 >60 mL/min    Comment: (NOTE) The eGFR has been calculated using the CKD EPI equation. This calculation has not been validated in all clinical situations. eGFR's persistently <60 mL/min signify possible Chronic Kidney Disease.  Anion gap 9 5 - 15  Lactate dehydrogenase     Status: None   Collection Time: 11/25/16  8:57 AM  Result Value Ref Range   LDH 130 98 - 192 U/L  Comprehensive metabolic panel     Status: Abnormal   Collection Time: 11/25/16  8:57 AM  Result Value Ref Range   Sodium 139 135 - 145 mmol/L   Potassium 4.7 3.5 - 5.1 mmol/L   Chloride 104 101 - 111 mmol/L   CO2 28 22 - 32 mmol/L   Glucose, Bld 137 (H) 65 - 99 mg/dL   BUN 21 (H) 6 - 20 mg/dL   Creatinine, Ser 0.95 0.61 - 1.24 mg/dL   Calcium 9.1 8.9 - 10.3 mg/dL   Total Protein 7.8 6.5 - 8.1 g/dL   Albumin 4.0 3.5 - 5.0 g/dL   AST 29 15 - 41 U/L   ALT 27 17 - 63 U/L   Alkaline Phosphatase 104 38 - 126 U/L   Total Bilirubin 0.3 0.3 - 1.2 mg/dL   GFR calc non Af Amer >60 >60 mL/min   GFR calc Af Amer >60 >60 mL/min    Comment: (NOTE) The eGFR has been calculated using the CKD EPI equation. This calculation has not been validated in all clinical situations. eGFR's persistently <60 mL/min signify possible Chronic Kidney Disease.    Anion gap 7 5 - 15  CBC with Differential/Platelet     Status: Abnormal   Collection Time: 11/25/16  8:57 AM   Result Value Ref Range   WBC 6.5 3.8 - 10.6 K/uL   RBC 5.01 4.40 - 5.90 MIL/uL   Hemoglobin 13.9 13.0 - 18.0 g/dL   HCT 41.8 40.0 - 52.0 %   MCV 83.6 80.0 - 100.0 fL   MCH 27.7 26.0 - 34.0 pg   MCHC 33.1 32.0 - 36.0 g/dL   RDW 15.2 (H) 11.5 - 14.5 %   Platelets 301 150 - 440 K/uL   Neutrophils Relative % 50 %   Neutro Abs 3.3 1.4 - 6.5 K/uL   Lymphocytes Relative 27 %   Lymphs Abs 1.7 1.0 - 3.6 K/uL   Monocytes Relative 16 %   Monocytes Absolute 1.0 0.2 - 1.0 K/uL   Eosinophils Relative 6 %   Eosinophils Absolute 0.4 0 - 0.7 K/uL   Basophils Relative 1 %   Basophils Absolute 0.1 0 - 0.1 K/uL    Radiology: No results found.  No results found.  No results found.    Assessment and Plan: Patient Active Problem List   Diagnosis Date Noted  . Abdominal abscess 12/13/2015  . 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 Localized skin mass, lump, or swelling 11/09/2015  . Internal nasal lesion 10/30/2015  . Fitting and adjustment of gastrointestinal appliance and device   . Disease of biliary tract   . History of stroke 07/25/2015  . Annual physical exam 07/03/2015  . Pneumonia 05/15/2015  . Fever 05/15/2015  . Hypoxia 05/07/2015  . Pancreatic mass 01/12/2015  . Obstructive jaundice 12/28/2014  . Arthritis 06/09/2014  . Benign essential HTN 10/04/2013  . 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 Acid reflux 10/04/2013  . Arthritis, degenerative 10/04/2013  . HLD (hyperlipidemia) 10/04/2013  . H/O disease 10/04/2013  . Apnea, sleep 10/04/2013  . Type 2 diabetes mellitus ([place]) 10/04/2013  . Stroke ([place]) 09/08/2013  . Obstructive sleep apnea 02/03/2013  . Depression 07/05/2012  . Major depressive disorder with single episode 07/05/2012  .  Hyperlipidemia LDL goal <100 12/23/2011  . Atrial fibrillation ([place])   . Hypertension     1. OSA Controlled at this time Will continue with his current pressure levels Last download was  reviewed in CPAP clinic  2. Morbid Obesity Again needs to work on weight loss Needs dietary and exercise regimen  3. NHL history Followed by oncology  4. Atrial fibrillation Now is better controlled follows with cards   General Counseling: I have discussed the findings of the evaluation and examination with Mason Houston.  I have also discussed any further diagnostic evaluation thatmay be needed or ordered today. Mason Houston verbalizes understanding of the findings of todays visit. We also reviewed his medications today and discussed drug interactions and side effects including but not limited excessive drowsiness and altered mental states. We also discussed that there is always a risk not just to him but also people around him. he has been encouraged to call the office with any questions or concerns that should arise related to todays visit.    Time spent: 52mn  I have personally obtained a history, examined the patient, evaluated laboratory and imaging results, formulated the assessment and plan and placed orders.    SAllyne Gee MD FFirst Baptist Medical CenterPulmonary and Critical Care Sleep medicine

## 2017-01-19 NOTE — Progress Notes (Signed)
Name: Mason Houston   MRN: [id number]    DOB: 1950/05/13   Date:01/19/2017       Progress Note  Subjective  Chief Complaint  Chief Complaint  Patient presents with  . spot on toe    Turning black; right toe pt states he did not bump it. Pt thinkis its internal bleeding from [place]. Not painful. felt tender thursday.   . knee    Left knee; pt starting having issues after helping with a neighbor going and down the ladder.  Use a tradmill and states if he use it does it make it flare up as well     HPI  Pt. Is here for evaluation of a bruise on the right foot second toe, he was helping a friend lay shingles on the roof last week (6 days ago) and may have hit his foot on a bundle of shingles, noticed pain and swelling and later on turned the distal toe nail and toe black. He has no active bleeding of the toe, no pain, the hematoma is contained now. He wanted to find out if he should stop Xarelto.  In addition, he is also having left knee pain after he went up and down the ladder at the time, now he notices pain in the left knee when he tries to run the treadmill, this limits his activity.      Past Medical History:  Diagnosis Date  . Atrial fibrillation ([place])   . Cancer ([place])   . Collagen vascular disease ([place])   . Fibromyalgia   . GERD (gastroesophageal reflux disease)   . H/O Bell's palsy   . History of [place] measles   . Hypertension   . Mini stroke ([place])   . Non Hodgkin's lymphoma ([place]) 2017  . Osteoarthrosis, unspecified whether generalized or localized, lower leg   . Sixth cranial nerve palsy   . Sleep apnea   . Stroke [facility])     Past Surgical History:  Procedure Laterality Date  . COLONOSCOPY  2012   Dr Candace Cruise  . CYST EXCISION    . ERCP N/A 12/29/2014   Procedure: ENDOSCOPIC RETROGRADE CHOLANGIOPANCREATOGRAPHY (ERCP);  Surgeon: Hulen Luster, MD;  Location: [facility] ENDOSCOPY;  Service: Endoscopy;  Laterality: N/A;  . ERCP N/A 02/21/2015   Procedure: ENDOSCOPIC RETROGRADE  CHOLANGIOPANCREATOGRAPHY (ERCP);  Surgeon: Hulen Luster, MD;  Location: [facility] ENDOSCOPY;  Service: Gastroenterology;  Laterality: N/A;  . ERCP N/A 08/14/2015   Procedure: ENDOSCOPIC RETROGRADE CHOLANGIOPANCREATOGRAPHY (ERCP) Stent removal;  Surgeon: [name], MD;  Location: [facility] ENDOSCOPY;  Service: Endoscopy;  Laterality: N/A;  . PERIPHERAL VASCULAR CATHETERIZATION N/A 01/31/2015   Procedure: Glori Luis Cath Insertion;  Surgeon: Katha Cabal, MD;  Location: [place] CV LAB;  Service: Cardiovascular;  Laterality: N/A;  . PERIPHERAL VASCULAR CATHETERIZATION N/A 12/18/2015   Procedure: Glori Luis Cath Removal;  Surgeon: Katha Cabal, MD;  Location: [place] CV LAB;  Service: Cardiovascular;  Laterality: N/A;  . TRIGGER FINGER RELEASE      Family History  Problem Relation Age of Onset  . Hypertension Mother   . Heart attack Father   . Stroke Father     Social History   Socioeconomic History  . Marital status: Married    Spouse name: Not on file  . Number of children: Not on file  . Years of education: Not on file  . Highest education level: Not on file  Social Needs  . Financial resource strain: Not on file  . Food insecurity -  worry: Not on file  . Food insecurity - inability: Not on file  . Transportation needs - medical: Not on file  . Transportation needs - non-medical: Not on file  Occupational History  . Not on file  Tobacco Use  . Smoking status: Former Smoker    Packs/day: 4.50    Years: 15.00    Pack years: 67.50    Types: Cigarettes  . Smokeless tobacco: Never Used  Substance and Sexual Activity  . Alcohol use: No  . Drug use: No    Comment: PAST  . Sexual activity: Not on file  Other Topics Concern  . Not on file  Social History Narrative  . Not on file     Current Outpatient Medications:  .  acetaminophen (TYLENOL) 650 MG CR tablet, Take 1,300 mg by mouth daily., Disp: , Rfl:  .  Ascorbic Acid (VITAMIN C) 1000 MG tablet, Take 1,000 mg by mouth  daily., Disp: , Rfl:  .  aspirin EC 325 MG tablet, Take 325 mg by mouth daily., Disp: , Rfl:  .  b complex vitamins capsule, Take 1 capsule by mouth daily. , Disp: , Rfl:  .  baclofen (LIORESAL) 10 MG tablet, Take 10 mg by mouth daily as needed for muscle spasms., Disp: , Rfl:  .  cholecalciferol (VITAMIN D) 1000 units tablet, Take 1,000 Units by mouth daily., Disp: , Rfl:  .  diltiazem (TIAZAC) 360 MG 24 hr capsule, TAKE 1 CAPSULE BY MOUTH EVERY DAY, Disp: 90 capsule, Rfl: 3 .  fluticasone (FLONASE) 50 MCG/ACT nasal spray, Place 1 spray into both nostrils daily., Disp: , Rfl:  .  loratadine (CLARITIN) 10 MG tablet, Take 10 mg by mouth daily., Disp: , Rfl:  .  losartan (COZAAR) 50 MG tablet, Take 1 tablet (50 mg total) by mouth daily., Disp: 90 tablet, Rfl: 3 .  magnesium oxide (MAG-OX) 400 (241.3 Mg) MG tablet, Take 250 mg daily by mouth. , Disp: , Rfl:  .  Melatonin 5 MG TABS, Take by mouth as needed., Disp: , Rfl:  .  omeprazole (PRILOSEC) 20 MG capsule, TAKE 1 CAPSULE(20 MG) BY MOUTH DAILY, Disp: 90 capsule, Rfl: 3 .  rivaroxaban (XARELTO) 20 MG TABS tablet, TAKE 1 TABLET(20 MG) BY MOUTH DAILY WITH SUPPER, Disp: 90 tablet, Rfl: 0 .  rosuvastatin (CRESTOR) 10 MG tablet, TAKE 1 TABLET(10 MG) BY MOUTH DAILY, Disp: 90 tablet, Rfl: 0  Allergies  Allergen Reactions  . Celebrex [Celecoxib] Hives  . Other Itching    FAB LAUNDRY DETERGENT (itching)  . Penicillins Hives and Other (See Comments)    Has patient had a PCN reaction causing immediate rash, facial/tongue/throat swelling, SOB or lightheadedness with hypotension: No Has patient had a PCN reaction causing severe rash involving mucus membranes or skin necrosis: No Has patient had a PCN reaction that required hospitalization No Has patient had a PCN reaction occurring within the last 10 years: No If all of the above answers are "NO", then may proceed with Cephalosporin use.     ROS  Please see history of present illness for complete  discussion of ROS  Objective  Vitals:   01/19/17 1325  BP: 136/64  Pulse: 70  Resp: 16  Temp: 98 F (36.7 C)  TempSrc: Oral  SpO2: 97%  Weight: 274 lb (124.3 kg)    Physical Exam  Constitutional: He is well-developed, well-nourished, and in no distress.  Musculoskeletal:       Left knee: He exhibits no laceration and no  erythema. Tenderness found. Lateral joint line tenderness noted.       Feet:  Small hematoma at the distal end of right 2nd toe, the toe nail is discolored, no drainage, the region is not tender to palpation.   Nursing note and vitals reviewed.    Recent Results (from the past 2160 hour(s))  Basic Metabolic Panel (BMET)     Status: Abnormal   Collection Time: 11/11/16 10:09 AM  Result Value Ref Range   Sodium 137 135 - 145 mmol/L   Potassium 4.0 3.5 - 5.1 mmol/L   Chloride 102 101 - 111 mmol/L   CO2 26 22 - 32 mmol/L   Glucose, Bld 160 (H) 65 - 99 mg/dL   BUN 21 (H) 6 - 20 mg/dL   Creatinine, Ser 0.96 0.61 - 1.24 mg/dL   Calcium 8.7 (L) 8.9 - 10.3 mg/dL   GFR calc non Af Amer >60 >60 mL/min   GFR calc Af Amer >60 >60 mL/min    Comment: (NOTE) The eGFR has been calculated using the CKD EPI equation. This calculation has not been validated in all clinical situations. eGFR's persistently <60 mL/min signify possible Chronic Kidney Disease.    Anion gap 9 5 - 15  Lactate dehydrogenase     Status: None   Collection Time: 11/25/16  8:57 AM  Result Value Ref Range   LDH 130 98 - 192 U/L  Comprehensive metabolic panel     Status: Abnormal   Collection Time: 11/25/16  8:57 AM  Result Value Ref Range   Sodium 139 135 - 145 mmol/L   Potassium 4.7 3.5 - 5.1 mmol/L   Chloride 104 101 - 111 mmol/L   CO2 28 22 - 32 mmol/L   Glucose, Bld 137 (H) 65 - 99 mg/dL   BUN 21 (H) 6 - 20 mg/dL   Creatinine, Ser 0.95 0.61 - 1.24 mg/dL   Calcium 9.1 8.9 - 10.3 mg/dL   Total Protein 7.8 6.5 - 8.1 g/dL   Albumin 4.0 3.5 - 5.0 g/dL   AST 29 15 - 41 U/L   ALT 27 17 -  63 U/L   Alkaline Phosphatase 104 38 - 126 U/L   Total Bilirubin 0.3 0.3 - 1.2 mg/dL   GFR calc non Af Amer >60 >60 mL/min   GFR calc Af Amer >60 >60 mL/min    Comment: (NOTE) The eGFR has been calculated using the CKD EPI equation. This calculation has not been validated in all clinical situations. eGFR's persistently <60 mL/min signify possible Chronic Kidney Disease.    Anion gap 7 5 - 15  CBC with Differential/Platelet     Status: Abnormal   Collection Time: 11/25/16  8:57 AM  Result Value Ref Range   WBC 6.5 3.8 - 10.6 K/uL   RBC 5.01 4.40 - 5.90 MIL/uL   Hemoglobin 13.9 13.0 - 18.0 g/dL   HCT 41.8 40.0 - 52.0 %   MCV 83.6 80.0 - 100.0 fL   MCH 27.7 26.0 - 34.0 pg   MCHC 33.1 32.0 - 36.0 g/dL   RDW 15.2 (H) 11.5 - 14.5 %   Platelets 301 150 - 440 K/uL   Neutrophils Relative % 50 %   Neutro Abs 3.3 1.4 - 6.5 K/uL   Lymphocytes Relative 27 %   Lymphs Abs 1.7 1.0 - 3.6 K/uL   Monocytes Relative 16 %   Monocytes Absolute 1.0 0.2 - 1.0 K/uL   Eosinophils Relative 6 %   Eosinophils Absolute 0.4 0 -  0.7 K/uL   Basophils Relative 1 %   Basophils Absolute 0.1 0 - 0.1 K/uL     Assessment & Plan  1. Acute pain of left knee Suspected osteoarthritis, patient advised to rest and avoid using treadmill for a few days, will reassess at his next appointment  2. Toenail bruise, right, initial encounter Reassured, the hematoma should resolve, however if the toenail does not heal, he may need to see a podiatrist for removal.    Khamiya Varin [facility] 01/19/2017 2:00 PM

## 2017-01-28 ENCOUNTER — Other Ambulatory Visit: Payer: Self-pay | Admitting: Family Medicine

## 2017-01-28 DIAGNOSIS — E785 Hyperlipidemia, unspecified: Secondary | ICD-10-CM

## 2017-02-10 ENCOUNTER — Ambulatory Visit: Payer: 59 | Admitting: Family Medicine

## 2017-02-15 IMAGING — CT NM PET TUM IMG RESTAG (PS) SKULL BASE T - THIGH
1 of 9 series · 1 of 25 positions shown · non-contrast
Comparison: 08/03/2015

CLINICAL DATA: Subsequent treatment strategy for diffuse large
B-cell lymphoma..

EXAM:
NUCLEAR MEDICINE PET SKULL BASE TO THIGH
TECHNIQUE: 12.44 mCi F-18 FDG was injected intravenously. Full-ring PET imaging
was performed from the skull base to thigh after the radiotracer. CT
data was obtained and used for attenuation correction and anatomic
localization.
FASTING BLOOD GLUCOSE:  Value: 101 mg/dl

[Series 3: ct wb 5.0 b30f · axial · 5.0mm · 0.98mm/px · 1 of 290 slices shown]
[im 290/290  brain]
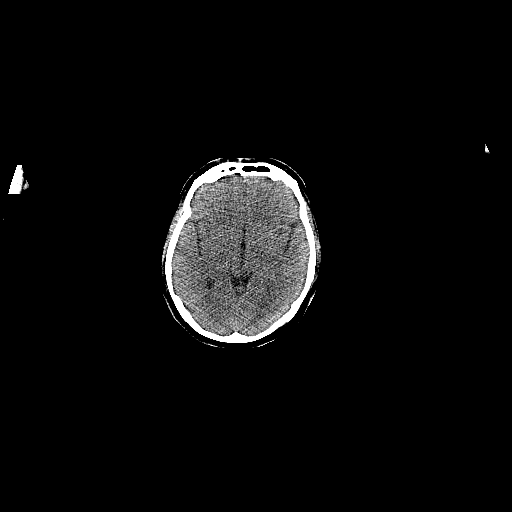

[1 of 25 positions shown; findings below may reference images not displayed]

FINDINGS: NECK

No hypermetabolic lymph nodes in the neck.

CHEST

No hypermetabolic mediastinal or hilar nodes. No suspicious
pulmonary nodules on the CT scan.

ABDOMEN/PELVIS

No abnormal hypermetabolic activity within the liver, pancreas,
adrenal glands, or spleen. No hypermetabolic lymph nodes in the
abdomen or pelvis. No abnormal activity associated the pancreas.
Common bile duct stent noted.

Small focus of subcutaneous inflammation in the LEFT suprapubic
abdominal wall (image 236, series 3) with mild metabolic activity

SKELETON

No focal hypermetabolic activity to suggest skeletal metastasis.
IMPRESSION: 1. No evidence of lymphoma recurrence on skullbase to thigh FDG PET
scan.
2. Small focus of subcutaneous infection in the LEFT lower abdominal
wall.
3. Common bile duct stent without evidence of lymphoma.

## 2017-02-18 NOTE — Progress Notes (Signed)
95 percentile pressure 8   95th percentile leak 21.1   apnea index 2.2 /hr  apnea-hypopnea index  2.8 /hr   total days used  >4 hr 168 days  total days used <4 hr 7 days  Total compliance 93 percent

## 2017-03-31 ENCOUNTER — Other Ambulatory Visit: Payer: Self-pay | Admitting: Cardiovascular Disease

## 2017-03-31 NOTE — Telephone Encounter (Signed)
Please review for refill, Thanks !  

## 2017-04-07 DIAGNOSIS — I480 Paroxysmal atrial fibrillation: Secondary | ICD-10-CM | POA: Diagnosis not present

## 2017-04-07 DIAGNOSIS — F325 Major depressive disorder, single episode, in full remission: Secondary | ICD-10-CM | POA: Diagnosis not present

## 2017-04-07 DIAGNOSIS — Z1329 Encounter for screening for other suspected endocrine disorder: Secondary | ICD-10-CM | POA: Diagnosis not present

## 2017-04-07 DIAGNOSIS — I10 Essential (primary) hypertension: Secondary | ICD-10-CM | POA: Diagnosis not present

## 2017-04-07 DIAGNOSIS — J309 Allergic rhinitis, unspecified: Secondary | ICD-10-CM | POA: Diagnosis not present

## 2017-04-07 DIAGNOSIS — Z125 Encounter for screening for malignant neoplasm of prostate: Secondary | ICD-10-CM | POA: Diagnosis not present

## 2017-04-07 DIAGNOSIS — E782 Mixed hyperlipidemia: Secondary | ICD-10-CM | POA: Diagnosis not present

## 2017-04-07 DIAGNOSIS — E118 Type 2 diabetes mellitus with unspecified complications: Secondary | ICD-10-CM | POA: Diagnosis not present

## 2017-04-07 DIAGNOSIS — K219 Gastro-esophageal reflux disease without esophagitis: Secondary | ICD-10-CM | POA: Diagnosis not present

## 2017-04-07 DIAGNOSIS — G4733 Obstructive sleep apnea (adult) (pediatric): Secondary | ICD-10-CM | POA: Diagnosis not present

## 2017-04-20 DIAGNOSIS — G4733 Obstructive sleep apnea (adult) (pediatric): Secondary | ICD-10-CM | POA: Diagnosis not present

## 2017-04-20 DIAGNOSIS — G4730 Sleep apnea, unspecified: Secondary | ICD-10-CM | POA: Diagnosis not present

## 2017-04-23 ENCOUNTER — Telehealth: Payer: Self-pay | Admitting: Cardiovascular Disease

## 2017-04-23 DIAGNOSIS — I639 Cerebral infarction, unspecified: Secondary | ICD-10-CM | POA: Diagnosis not present

## 2017-04-23 DIAGNOSIS — E65 Localized adiposity: Secondary | ICD-10-CM | POA: Diagnosis not present

## 2017-04-23 DIAGNOSIS — C8333 Diffuse large B-cell lymphoma, intra-abdominal lymph nodes: Secondary | ICD-10-CM | POA: Diagnosis not present

## 2017-04-23 DIAGNOSIS — G4733 Obstructive sleep apnea (adult) (pediatric): Secondary | ICD-10-CM | POA: Diagnosis not present

## 2017-04-23 DIAGNOSIS — I480 Paroxysmal atrial fibrillation: Secondary | ICD-10-CM | POA: Diagnosis not present

## 2017-04-23 NOTE — Telephone Encounter (Signed)
Patient came by office He had spoke with another doctor today and had mentioned he was taking a few drops of CBD oil a day Doctor advised him to get in touch with cardiologist to see if there could be an interaction regarding the Xarelto medication  Please call to discuss

## 2017-04-23 NOTE — Telephone Encounter (Signed)
To pharmacy to review. 

## 2017-04-23 NOTE — Telephone Encounter (Signed)
CBD oil inhibits the metabolism of Xarelto and will increase the patient's risk of bleeding. I would recommend against CBD oil with Xarelto.

## 2017-04-24 NOTE — Telephone Encounter (Signed)
Reviewed recommendations with patient who verbalized understanding to refrain from CBD oil since he takes xarelto. Patient appreciative of the call back.

## 2017-05-11 DIAGNOSIS — G4733 Obstructive sleep apnea (adult) (pediatric): Secondary | ICD-10-CM | POA: Diagnosis not present

## 2017-05-11 DIAGNOSIS — G4730 Sleep apnea, unspecified: Secondary | ICD-10-CM | POA: Diagnosis not present

## 2017-05-26 ENCOUNTER — Other Ambulatory Visit: Payer: Self-pay

## 2017-05-26 ENCOUNTER — Inpatient Hospital Stay: Payer: PPO | Attending: Internal Medicine | Admitting: Internal Medicine

## 2017-05-26 ENCOUNTER — Inpatient Hospital Stay: Payer: PPO

## 2017-05-26 ENCOUNTER — Encounter: Payer: Self-pay | Admitting: Internal Medicine

## 2017-05-26 DIAGNOSIS — C8333 Diffuse large B-cell lymphoma, intra-abdominal lymph nodes: Secondary | ICD-10-CM

## 2017-05-26 DIAGNOSIS — Z9221 Personal history of antineoplastic chemotherapy: Secondary | ICD-10-CM | POA: Diagnosis not present

## 2017-05-26 DIAGNOSIS — I480 Paroxysmal atrial fibrillation: Secondary | ICD-10-CM | POA: Diagnosis not present

## 2017-05-26 DIAGNOSIS — M797 Fibromyalgia: Secondary | ICD-10-CM | POA: Diagnosis not present

## 2017-05-26 DIAGNOSIS — Z79899 Other long term (current) drug therapy: Secondary | ICD-10-CM | POA: Insufficient documentation

## 2017-05-26 DIAGNOSIS — K831 Obstruction of bile duct: Secondary | ICD-10-CM | POA: Insufficient documentation

## 2017-05-26 DIAGNOSIS — G4730 Sleep apnea, unspecified: Secondary | ICD-10-CM | POA: Insufficient documentation

## 2017-05-26 DIAGNOSIS — K219 Gastro-esophageal reflux disease without esophagitis: Secondary | ICD-10-CM | POA: Insufficient documentation

## 2017-05-26 DIAGNOSIS — Z7901 Long term (current) use of anticoagulants: Secondary | ICD-10-CM | POA: Insufficient documentation

## 2017-05-26 DIAGNOSIS — I10 Essential (primary) hypertension: Secondary | ICD-10-CM | POA: Insufficient documentation

## 2017-05-26 DIAGNOSIS — Z87891 Personal history of nicotine dependence: Secondary | ICD-10-CM | POA: Diagnosis not present

## 2017-05-26 DIAGNOSIS — Z8673 Personal history of transient ischemic attack (TIA), and cerebral infarction without residual deficits: Secondary | ICD-10-CM | POA: Insufficient documentation

## 2017-05-26 DIAGNOSIS — C8338 Diffuse large B-cell lymphoma, lymph nodes of multiple sites: Secondary | ICD-10-CM | POA: Insufficient documentation

## 2017-05-26 LAB — CBC WITH DIFFERENTIAL/PLATELET
Basophils Absolute: 0.1 10*3/uL (ref 0–0.1)
Basophils Relative: 1 %
Eosinophils Absolute: 0.2 10*3/uL (ref 0–0.7)
Eosinophils Relative: 3 %
HCT: 39.5 % — ABNORMAL LOW (ref 40.0–52.0)
Hemoglobin: 13.1 g/dL (ref 13.0–18.0)
Lymphocytes Relative: 22 %
Lymphs Abs: 1.5 10*3/uL (ref 1.0–3.6)
MCH: 27.9 pg (ref 26.0–34.0)
MCHC: 33.2 g/dL (ref 32.0–36.0)
MCV: 84.1 fL (ref 80.0–100.0)
Monocytes Absolute: 0.7 10*3/uL (ref 0.2–1.0)
Monocytes Relative: 11 %
Neutro Abs: 4.3 10*3/uL (ref 1.4–6.5)
Neutrophils Relative %: 63 %
Platelets: 335 10*3/uL (ref 150–440)
RBC: 4.69 MIL/uL (ref 4.40–5.90)
RDW: 14.5 % (ref 11.5–14.5)
WBC: 6.8 10*3/uL (ref 3.8–10.6)

## 2017-05-26 LAB — COMPREHENSIVE METABOLIC PANEL
ALT: 23 U/L (ref 17–63)
AST: 32 U/L (ref 15–41)
Albumin: 4.2 g/dL (ref 3.5–5.0)
Alkaline Phosphatase: 89 U/L (ref 38–126)
Anion gap: 11 (ref 5–15)
BUN: 20 mg/dL (ref 6–20)
CO2: 24 mmol/L (ref 22–32)
Calcium: 9 mg/dL (ref 8.9–10.3)
Chloride: 103 mmol/L (ref 101–111)
Creatinine, Ser: 0.94 mg/dL (ref 0.61–1.24)
GFR calc Af Amer: >60 mL/min (ref >60)
GFR calc non Af Amer: >60 mL/min (ref >60)
Glucose, Bld: 141 mg/dL — ABNORMAL HIGH (ref 65–99)
Potassium: 4.1 mmol/L (ref 3.5–5.1)
Sodium: 138 mmol/L (ref 135–145)
Total Bilirubin: 0.6 mg/dL (ref 0.3–1.2)
Total Protein: 8 g/dL (ref 6.5–8.1)

## 2017-05-26 LAB — LACTATE DEHYDROGENASE: LDH: 160 U/L (ref 98–192)

## 2017-05-26 NOTE — Assessment & Plan Note (Addendum)
#   DLBCL of pancreatic head-  Stage IE. S/p R-CHOP [finished 2017 April].  Currently no evidence of disease clinically; stable.  Continue surveillance on a clinical basis.  # Dizzy spells/ worsened sec to BPVV; unlikely stroke; New- recommend follow up with Dr.Shah if worse.   # Elevated Blood Pressure-150/90s; improved. Continue On anti-HTN  # Paroxysmal A.fib- on xarelto.  Stable.  Discussed re: bleeding/falls.   # follow up in 6 months/labs.   # 25 minutes face-to-face with the patient discussing the above plan of care; more than 50% of time spent on prognosis/ natural history; counseling and coordination.

## 2017-05-26 NOTE — Progress Notes (Signed)
[name] OFFICE PROGRESS NOTE  Patient Care Team: Roselee Nova, MD as PCP - General (Internal Medicine) [name], MD (Infectious Diseases) Wellington Hampshire, MD as Consulting Physician (Cardiology) Bary Castilla Forest Gleason, MD (General Surgery) Roselee Nova, MD as Referring Physician [facility] Medicine)  Cancer Staging No matching staging information was found for the patient.   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 A.fib- off xarelto [mild blood in urine]  --------------------------------------------------------   DIAGNOSIS: [Jan 2017 ]- DLBCL STAGE: IE  ;GOALS: CURATIVE CURRENT/MOST RECENT THERAPY [finished April 2017- R-CHOP X5]-surveillance      Diffuse large b-cell lymphoma, intra-abdominal lymph nodes ([place])      INTERVAL HISTORY:  Mason Houston 67 y.o.  male pleasant patient above history of diffuse large B-cell lymphoma is here for follow-up.  Patient had episodes of dizzy spells especially with movement x2 over the last month or so.  Denies any falls.  Denies any lightheadedness.  The episode lasted for few minutes and then resolve spontaneously.  Patient concerned about stroke/and prior history of TIAs.  No focal episodes.  No headaches.  No vision changes.  Denies abdominal pain.  Denies any fatigue.  Continues to make small strides with weight loss which is intentional.  He states he is under a lot of stress at home  Review of Systems  Constitutional: Negative for chills, diaphoresis, fever, malaise/fatigue and weight loss.  HENT: Negative for nosebleeds and sore  throat.   Eyes: Negative for double vision.  Respiratory: Negative for cough, hemoptysis, sputum production, shortness of breath and wheezing.   Cardiovascular: Negative for chest pain, palpitations, orthopnea and leg swelling.  Gastrointestinal: Negative for abdominal pain, blood in stool, constipation, diarrhea, heartburn, melena, nausea and vomiting.  Genitourinary: Negative for dysuria, frequency and urgency.  Musculoskeletal: Negative for back pain and joint pain.  Skin: Negative.  Negative for itching and rash.  Neurological: Positive for dizziness. Negative for tingling, focal weakness, weakness and headaches.  Endo/Heme/Allergies: Does not bruise/bleed easily.  Psychiatric/Behavioral: Negative for depression. The patient is nervous/anxious. The patient does not have insomnia.       PAST MEDICAL HISTORY :  Past Medical History:  Diagnosis Date  . Atrial fibrillation ([place])   . Cancer ([place])   . Collagen vascular disease ([place])   . Fibromyalgia   . GERD (gastroesophageal reflux disease)   . H/O Bell's palsy   . History of [place] measles   . Hypertension   . Mini stroke ([place])   . Non Hodgkin's lymphoma ([place]) 2017  . Osteoarthrosis, unspecified whether generalized or localized, lower leg   . Sixth cranial nerve palsy   . Sleep apnea   . Stroke [facility])     PAST SURGICAL HISTORY :   Past Surgical History:  Procedure Laterality Date  . COLONOSCOPY  2012   Dr Candace Cruise  . CYST EXCISION    . ERCP N/A 12/29/2014   Procedure: ENDOSCOPIC RETROGRADE CHOLANGIOPANCREATOGRAPHY (ERCP);  Surgeon: Hulen Luster, MD;  Location: [facility] ENDOSCOPY;  Service: Endoscopy;  Laterality: N/A;  . ERCP N/A 02/21/2015   Procedure: ENDOSCOPIC  RETROGRADE CHOLANGIOPANCREATOGRAPHY (ERCP);  Surgeon: Hulen Luster, MD;  Location[facility] ENDOSCOPY;  Service: Gastroenterology;  Laterality: N/A;  . ERCP N/A 08/14/2015   Procedure: ENDOSCOPIC RETROGRADE CHOLANGIOPANCREATOGRAPHY (ERCP) Stent removal;  Surgeon: [name], MD;   Location: [facility] ENDOSCOPY;  Service: Endoscopy;  Laterality: N/A;  . PERIPHERAL VASCULAR CATHETERIZATION N/A 01/31/2015   Procedure: Glori Luis Cath Insertion;  Surgeon: Katha Cabal, MD;  Location: [place] CV LAB;  Service: Cardiovascular;  Laterality: N/A;  . PERIPHERAL VASCULAR CATHETERIZATION N/A 12/18/2015   Procedure: Glori Luis Cath Removal;  Surgeon: Katha Cabal, MD;  Location: [place] CV LAB;  Service: Cardiovascular;  Laterality: N/A;  . TRIGGER FINGER RELEASE      FAMILY HISTORY :   Family History  Problem Relation Age of Onset  . Hypertension Mother   . Heart attack Father   . Stroke Father     SOCIAL HISTORY:   Social History   Tobacco Use  . Smoking status: Former Smoker    Packs/day: 4.50    Years: 15.00    Pack years: 67.50    Types: Cigarettes  . Smokeless tobacco: Never Used  Substance Use Topics  . Alcohol use: No  . Drug use: No    Types: Marijuana    Comment: PAST    ALLERGIES:  is allergic to celebrex [celecoxib]; other; and penicillins.  MEDICATIONS:  Current Outpatient Medications  Medication Sig Dispense Refill  . acetaminophen (TYLENOL) 650 MG CR tablet Take 1,300 mg by mouth daily.    . Ascorbic Acid (VITAMIN C) 1000 MG tablet Take 1,000 mg by mouth daily.    Marland Kitchen b complex vitamins capsule Take 1 capsule by mouth daily.     . cholecalciferol (VITAMIN D) 1000 units tablet Take 1,000 Units by mouth daily.    Marland Kitchen diltiazem (TIAZAC) 360 MG 24 hr capsule TAKE 1 CAPSULE BY MOUTH EVERY DAY 90 capsule 3  . fluticasone (FLONASE) 50 MCG/ACT nasal spray Place 1 spray into both nostrils daily.    Marland Kitchen loratadine (CLARITIN) 10 MG tablet Take 10 mg by mouth daily.    Marland Kitchen losartan (COZAAR) 50 MG tablet Take 1 tablet (50 mg total) by mouth daily. 90 tablet 3  . magnesium oxide (MAG-OX) 400 (241.3 Mg) MG tablet Take 250 mg daily by mouth.     Marland Kitchen omeprazole (PRILOSEC) 20 MG capsule TAKE 1 CAPSULE(20 MG) BY MOUTH DAILY 90 capsule 3  . rosuvastatin (CRESTOR) 10  MG tablet TAKE 1 TABLET(10 MG) BY MOUTH DAILY 90 tablet 0  . XARELTO 20 MG TABS tablet TAKE 1 TABLET BY MOUTH ONCE DAILY WITH SUPPER 90 tablet 0   No current facility-administered medications for this visit.     PHYSICAL EXAMINATION: ECOG PERFORMANCE STATUS: 0 - Asymptomatic  BP (!) 151/86 (Patient Position: Sitting)   Pulse 70   Temp 97.9 F (36.6 C) (Tympanic)   Resp (!) 22   Ht 5\' 7"  (1.702 m)   Wt 273 lb 5.9 oz (124 kg)   BMI 42.82 kg/m   Filed Weights   05/26/17 1022  Weight: 273 lb 5.9 oz (124 kg)    GENERAL: Well-nourished well-developed; Alert, no distress and comfortable. Alone.  EYES: no pallor or icterus OROPHARYNX: no thrush or ulceration; NECK: supple; no lymph nodes felt. LYMPH:  no palpable lymphadenopathy in the axillary or inguinal regions LUNGS: Decreased breath sounds auscultation bilaterally. No wheeze or crackles HEART/CVS: regular rate & rhythm and no murmurs; No lower extremity edema ABDOMEN:abdomen soft, non-tender and normal  bowel sounds. No hepatomegaly or splenomegaly.  Musculoskeletal:no cyanosis of digits and no clubbing  PSYCH: alert & oriented x 3 with fluent speech NEURO: no focal motor/sensory deficits SKIN:  no rashes or significant lesions    LABORATORY DATA:  I have reviewed the data as listed    Component Value Date/Time   NA 138 05/26/2017 0956   NA 142 05/15/2015 1156   NA 139 12/19/2011 1044   K 4.1 05/26/2017 0956   K 4.1 12/19/2011 1044   CL 103 05/26/2017 0956   CL 106 12/19/2011 1044   CO2 24 05/26/2017 0956   CO2 26 12/19/2011 1044   GLUCOSE 141 (H) 05/26/2017 0956   GLUCOSE 95 12/19/2011 1044   BUN 20 05/26/2017 0956   BUN 16 05/15/2015 1156   BUN 18 12/19/2011 1044   CREATININE 0.94 05/26/2017 0956   CREATININE 0.88 05/08/2016 1152   CALCIUM 9.0 05/26/2017 0956   CALCIUM 9.0 12/19/2011 1044   PROT 8.0 05/26/2017 0956   PROT 6.1 05/15/2015 1156   PROT 9.0 (H) 12/19/2011 1044   ALBUMIN 4.2 05/26/2017 0956    ALBUMIN 3.7 05/15/2015 1156   ALBUMIN 4.4 12/19/2011 1044   AST 32 05/26/2017 0956   AST 44 (H) 12/19/2011 1044   ALT 23 05/26/2017 0956   ALT 46 12/19/2011 1044   ALKPHOS 89 05/26/2017 0956   ALKPHOS 110 12/19/2011 1044   BILITOT 0.6 05/26/2017 0956   BILITOT 0.2 05/15/2015 1156   BILITOT 0.7 12/19/2011 1044   GFRNONAA >60 05/26/2017 0956   GFRNONAA >89 05/08/2016 1152   GFRAA >60 05/26/2017 0956   GFRAA >89 05/08/2016 1152    No results found for: SPEP, UPEP  Lab Results  Component Value Date   WBC 6.8 05/26/2017   NEUTROABS 4.3 05/26/2017   HGB 13.1 05/26/2017   HCT 39.5 (L) 05/26/2017   MCV 84.1 05/26/2017   PLT 335 05/26/2017      Chemistry      Component Value Date/Time   NA 138 05/26/2017 0956   NA 142 05/15/2015 1156   NA 139 12/19/2011 1044   K 4.1 05/26/2017 0956   K 4.1 12/19/2011 1044   CL 103 05/26/2017 0956   CL 106 12/19/2011 1044   CO2 24 05/26/2017 0956   CO2 26 12/19/2011 1044   BUN 20 05/26/2017 0956   BUN 16 05/15/2015 1156   BUN 18 12/19/2011 1044   CREATININE 0.94 05/26/2017 0956   CREATININE 0.88 05/08/2016 1152      Component Value Date/Time   CALCIUM 9.0 05/26/2017 0956   CALCIUM 9.0 12/19/2011 1044   ALKPHOS 89 05/26/2017 0956   ALKPHOS 110 12/19/2011 1044   AST 32 05/26/2017 0956   AST 44 (H) 12/19/2011 1044   ALT 23 05/26/2017 0956   ALT 46 12/19/2011 1044   BILITOT 0.6 05/26/2017 0956   BILITOT 0.2 05/15/2015 1156   BILITOT 0.7 12/19/2011 1044       RADIOGRAPHIC STUDIES: I have personally reviewed the radiological images as listed and agreed with the findings in the report. No results found.   ASSESSMENT & PLAN:  Diffuse large b-cell lymphoma, intra-abdominal lymph nodes (HCC) # DLBCL of pancreatic head-  Stage IE. S/p R-CHOP [finished 2017 April].  Currently no evidence of disease clinically; stable.  Continue surveillance on a clinical basis.  # Dizzy spells/ worsened sec to BPVV; unlikely stroke; New- recommend  follow up with Dr.Shah if worse.   # Elevated Blood Pressure-150/90s; improved. Continue On anti-HTN  #  Paroxysmal A.fib- on xarelto.  Stable.  Discussed re: bleeding/falls.   # follow up in 6 months/labs.   # 25 minutes face-to-face with the patient discussing the above plan of care; more than 50% of time spent on prognosis/ natural history; counseling and coordination.   No orders of the defined types were placed in this encounter.  All questions were answered. The patient knows to call the clinic with any problems, questions or concerns.      Cammie Sickle, MD 05/26/2017 1:40 PM

## 2017-06-02 DIAGNOSIS — H5010 Unspecified exotropia: Secondary | ICD-10-CM | POA: Diagnosis not present

## 2017-06-08 ENCOUNTER — Other Ambulatory Visit: Payer: Self-pay | Admitting: Internal Medicine

## 2017-06-10 DIAGNOSIS — G4730 Sleep apnea, unspecified: Secondary | ICD-10-CM | POA: Diagnosis not present

## 2017-06-10 DIAGNOSIS — G4733 Obstructive sleep apnea (adult) (pediatric): Secondary | ICD-10-CM | POA: Diagnosis not present

## 2017-07-06 DIAGNOSIS — E782 Mixed hyperlipidemia: Secondary | ICD-10-CM | POA: Diagnosis not present

## 2017-07-06 DIAGNOSIS — Z8349 Family history of other endocrine, nutritional and metabolic diseases: Secondary | ICD-10-CM | POA: Diagnosis not present

## 2017-07-06 DIAGNOSIS — E118 Type 2 diabetes mellitus with unspecified complications: Secondary | ICD-10-CM | POA: Diagnosis not present

## 2017-07-06 DIAGNOSIS — Z125 Encounter for screening for malignant neoplasm of prostate: Secondary | ICD-10-CM | POA: Diagnosis not present

## 2017-07-10 DIAGNOSIS — G4730 Sleep apnea, unspecified: Secondary | ICD-10-CM | POA: Diagnosis not present

## 2017-07-10 DIAGNOSIS — G4733 Obstructive sleep apnea (adult) (pediatric): Secondary | ICD-10-CM | POA: Diagnosis not present

## 2017-07-13 DIAGNOSIS — I10 Essential (primary) hypertension: Secondary | ICD-10-CM | POA: Diagnosis not present

## 2017-07-13 DIAGNOSIS — Z0001 Encounter for general adult medical examination with abnormal findings: Secondary | ICD-10-CM | POA: Diagnosis not present

## 2017-07-13 DIAGNOSIS — B351 Tinea unguium: Secondary | ICD-10-CM | POA: Diagnosis not present

## 2017-07-13 DIAGNOSIS — Z0000 Encounter for general adult medical examination without abnormal findings: Secondary | ICD-10-CM | POA: Diagnosis not present

## 2017-07-13 DIAGNOSIS — E118 Type 2 diabetes mellitus with unspecified complications: Secondary | ICD-10-CM | POA: Diagnosis not present

## 2017-07-13 DIAGNOSIS — Z1211 Encounter for screening for malignant neoplasm of colon: Secondary | ICD-10-CM | POA: Diagnosis not present

## 2017-07-13 DIAGNOSIS — L600 Ingrowing nail: Secondary | ICD-10-CM | POA: Diagnosis not present

## 2017-07-13 DIAGNOSIS — G4733 Obstructive sleep apnea (adult) (pediatric): Secondary | ICD-10-CM | POA: Diagnosis not present

## 2017-07-13 DIAGNOSIS — I480 Paroxysmal atrial fibrillation: Secondary | ICD-10-CM | POA: Diagnosis not present

## 2017-07-15 ENCOUNTER — Ambulatory Visit (INDEPENDENT_AMBULATORY_CARE_PROVIDER_SITE_OTHER): Payer: PPO

## 2017-07-15 DIAGNOSIS — G4733 Obstructive sleep apnea (adult) (pediatric): Secondary | ICD-10-CM | POA: Diagnosis not present

## 2017-07-15 NOTE — Progress Notes (Signed)
95 percentile pressure 8   95th percentile leak 22.6   apnea index 2.4 /hr  apnea-hypopnea index  3.2 /hr   total days used  >4 hr 88 days  total days used <4 hr 0 days  Total compliance 100 percent  Patient doing great no problems

## 2017-07-23 ENCOUNTER — Ambulatory Visit (INDEPENDENT_AMBULATORY_CARE_PROVIDER_SITE_OTHER): Payer: PPO | Admitting: Internal Medicine

## 2017-07-23 ENCOUNTER — Encounter: Payer: Self-pay | Admitting: Internal Medicine

## 2017-07-23 VITALS — BP 132/76 | HR 82 | Resp 16 | Ht 68.0 in | Wt 271.8 lb

## 2017-07-23 DIAGNOSIS — K219 Gastro-esophageal reflux disease without esophagitis: Secondary | ICD-10-CM

## 2017-07-23 DIAGNOSIS — G4733 Obstructive sleep apnea (adult) (pediatric): Secondary | ICD-10-CM | POA: Diagnosis not present

## 2017-07-23 DIAGNOSIS — C8598 Non-Hodgkin lymphoma, unspecified, lymph nodes of multiple sites: Secondary | ICD-10-CM | POA: Diagnosis not present

## 2017-07-23 DIAGNOSIS — I482 Chronic atrial fibrillation, unspecified: Secondary | ICD-10-CM

## 2017-07-23 NOTE — Patient Instructions (Signed)

## 2017-07-23 NOTE — Progress Notes (Signed)
[facility] [place], [place] [address]  Pulmonary Sleep Medicine   Office Visit Note  Patient Name: Mason Houston DOB: [birth year] MRN [id number]  Date of Service: 07/23/2017  Complaints/HPI: Patient is doing well he is here for follow-up of sleep apnea.  Has had no issues with the CPAP.  States he is using his machine as prescribed.  He states he is still gaining benefit from the use of CPAP.  Denies having any cough no congestion.  No nasal on sinus infections. Patient has been under control as far as his non-Hodgkin's lymphoma is concerned.  The atrial fibrillation is also rate controlled.  Patient has no significant guarded noted at this time.  ROS  General: (-) fever, (-) chills, (-) night sweats, (-) weakness Skin: (-) rashes, (-) itching,. Eyes: (-) visual changes, (-) redness, (-) itching. Nose and Sinuses: (-) nasal stuffiness or itchiness, (-) postnasal drip, (-) nosebleeds, (-) sinus trouble. Mouth and Throat: (-) sore throat, (-) hoarseness. Neck: (-) swollen glands, (-) enlarged thyroid, (-) neck pain. Respiratory: - cough, (-) bloody sputum, - shortness of breath, - wheezing. Cardiovascular: - ankle swelling, (-) chest pain. Lymphatic: (-) lymph node enlargement. Neurologic: (-) numbness, (-) tingling. Psychiatric: (-) anxiety, (-) depression   Current Medication: Outpatient Encounter Medications as of 07/23/2017  Medication Sig  . acetaminophen (TYLENOL) 650 MG CR tablet Take 1,300 mg by mouth daily.  . Ascorbic Acid (VITAMIN C) 1000 MG tablet Take 1,000 mg by mouth daily.  Marland Kitchen b complex vitamins capsule Take 1 capsule by mouth daily.   . cholecalciferol (VITAMIN D) 1000 units tablet Take 1,000 Units by mouth daily.  Marland Kitchen diltiazem (TIAZAC) 360 MG 24 hr capsule TAKE 1 CAPSULE BY MOUTH EVERY DAY  . fluticasone (FLONASE) 50 MCG/ACT nasal spray Place 1 spray into both nostrils daily.  Marland Kitchen loratadine (CLARITIN) 10 MG tablet Take 10 mg by mouth  daily.  Marland Kitchen losartan (COZAAR) 50 MG tablet Take 1 tablet (50 mg total) by mouth daily.  . magnesium oxide (MAG-OX) 400 (241.3 Mg) MG tablet Take 250 mg daily by mouth.   . metFORMIN (GLUCOPHAGE) 500 MG tablet Take 500 mg by mouth 2 (two) times daily with a meal.  . omeprazole (PRILOSEC) 20 MG capsule TAKE 1 CAPSULE BY MOUTH ONCE DAILY  . rosuvastatin (CRESTOR) 10 MG tablet TAKE 1 TABLET(10 MG) BY MOUTH DAILY  . XARELTO 20 MG TABS tablet TAKE 1 TABLET BY MOUTH ONCE DAILY WITH SUPPER   No facility-administered encounter medications on file as of 07/23/2017.     Surgical History: Past Surgical History:  Procedure Laterality Date  . COLONOSCOPY  2012   Dr Candace Cruise  . CYST EXCISION    . ERCP N/A 12/29/2014   Procedure: ENDOSCOPIC RETROGRADE CHOLANGIOPANCREATOGRAPHY (ERCP);  Surgeon: Hulen Luster, MD;  Location: [facility] ENDOSCOPY;  Service: Endoscopy;  Laterality: N/A;  . ERCP N/A 02/21/2015   Procedure: ENDOSCOPIC RETROGRADE CHOLANGIOPANCREATOGRAPHY (ERCP);  Surgeon: Hulen Luster, MD;  Location: [facility] ENDOSCOPY;  Service: Gastroenterology;  Laterality: N/A;  . ERCP N/A 08/14/2015   Procedure: ENDOSCOPIC RETROGRADE CHOLANGIOPANCREATOGRAPHY (ERCP) Stent removal;  Surgeon: [name], MD;  Location: [facility] ENDOSCOPY;  Service: Endoscopy;  Laterality: N/A;  . PERIPHERAL VASCULAR CATHETERIZATION N/A 01/31/2015   Procedure: Glori Luis Cath Insertion;  Surgeon: Katha Cabal, MD;  Location: [place] CV LAB;  Service: Cardiovascular;  Laterality: N/A;  . PERIPHERAL VASCULAR CATHETERIZATION N/A 12/18/2015   Procedure: Glori Luis Cath Removal;  Surgeon: Katha Cabal, MD;  Location: [place] CV LAB;  Service: Cardiovascular;  Laterality: N/A;  . TRIGGER FINGER RELEASE      Medical History: Past Medical History:  Diagnosis Date  . Atrial fibrillation ([place])   . Cancer ([place])   . Collagen vascular disease ([place])   . Fibromyalgia   . GERD (gastroesophageal reflux disease)   . H/O Bell's palsy   . History of [place]  measles   . Hypertension   . Mini stroke ([place])   . Non Hodgkin's lymphoma ([place]) 2017  . Osteoarthrosis, unspecified whether generalized or localized, lower leg   . Sixth cranial nerve palsy   . Sleep apnea   . Stroke [facility])     Family History: Family History  Problem Relation Age of Onset  . Hypertension Mother   . Heart attack Father   . Stroke Father     Social History: Social History   Socioeconomic History  . Marital status: Married    Spouse name: Not on file  . Number of children: Not on file  . Years of education: Not on file  . Highest education level: Not on file  Occupational History  . Not on file  Social Needs  . Financial resource strain: Not on file  . Food insecurity:    Worry: Not on file    Inability: Not on file  . Transportation needs:    Medical: Not on file    Non-medical: Not on file  Tobacco Use  . Smoking status: Former Smoker    Packs/day: 4.50    Years: 15.00    Pack years: 67.50    Types: Cigarettes  . Smokeless tobacco: Never Used  Substance and Sexual Activity  . Alcohol use: No  . Drug use: No    Types: Marijuana    Comment: PAST  . Sexual activity: Not on file  Lifestyle  . Physical activity:    Days per week: Not on file    Minutes per session: Not on file  . Stress: Not on file  Relationships  . Social connections:    Talks on phone: Not on file    Gets together: Not on file    Attends religious service: Not on file    Active member of club or organization: Not on file    Attends meetings of clubs or organizations: Not on file    Relationship status: Not on file  . Intimate partner violence:    Fear of current or ex partner: Not on file    Emotionally abused: Not on file    Physically abused: Not on file    Forced sexual activity: Not on file  Other Topics Concern  . Not on file  Social History Narrative  . Not on file    Vital Signs: Blood pressure 132/76, pulse 82, resp. rate 16, height 5' 8"  (1.727 m),  weight 271 lb 12.8 oz (123.3 kg), SpO2 96 %.  Examination: General Appearance: The patient is well-developed, well-nourished, and in no distress. Skin: Gross inspection of skin unremarkable. Head: normocephalic, no gross deformities. Eyes: no gross deformities noted. ENT: ears appear grossly normal no exudates. Neck: Supple. No thyromegaly. No LAD. Respiratory:   No Rhonchi are noted at this time.. Cardiovascular: Normal S1 and S2 without murmur or rub. Extremities: No cyanosis. pulses are equal. Neurologic: Alert and oriented. No involuntary movements.  LABS: Recent Results (from the past 2160 hour(s))  Lactate dehydrogenase     Status: None   Collection Time: 05/26/17  9:56 AM  Result Value Ref Range   LDH 160 98 - 192 U/L    Comment: Performed at [facility], [address] [place], [place] [address]  Comprehensive metabolic panel     Status: Abnormal   Collection Time: 05/26/17  9:56 AM  Result Value Ref Range   Sodium 138 135 - 145 mmol/L   Potassium 4.1 3.5 - 5.1 mmol/L   Chloride 103 101 - 111 mmol/L   CO2 24 22 - 32 mmol/L   Glucose, Bld 141 (H) 65 - 99 mg/dL   BUN 20 6 - 20 mg/dL   Creatinine, Ser 0.94 0.61 - 1.24 mg/dL   Calcium 9.0 8.9 - 10.3 mg/dL   Total Protein 8.0 6.5 - 8.1 g/dL   Albumin 4.2 3.5 - 5.0 g/dL   AST 32 15 - 41 U/L   ALT 23 17 - 63 U/L   Alkaline Phosphatase 89 38 - 126 U/L   Total Bilirubin 0.6 0.3 - 1.2 mg/dL   GFR calc non Af Amer >60 >60 mL/min   GFR calc Af Amer >60 >60 mL/min    Comment: (NOTE) The eGFR has been calculated using the CKD EPI equation. This calculation has not been validated in all clinical situations. eGFR's persistently <60 mL/min signify possible Chronic Kidney Disease.    Anion gap 11 5 - 15    Comment: Performed at [facility], [address] [place], [place] [address]  CBC with Differential/Platelet     Status: Abnormal   Collection Time: 05/26/17  9:56 AM  Result Value Ref Range    WBC 6.8 3.8 - 10.6 K/uL   RBC 4.69 4.40 - 5.90 MIL/uL   Hemoglobin 13.1 13.0 - 18.0 g/dL   HCT 39.5 (L) 40.0 - 52.0 %   MCV 84.1 80.0 - 100.0 fL   MCH 27.9 26.0 - 34.0 pg   MCHC 33.2 32.0 - 36.0 g/dL   RDW 14.5 11.5 - 14.5 %   Platelets 335 150 - 440 K/uL   Neutrophils Relative % 63 %   Neutro Abs 4.3 1.4 - 6.5 K/uL   Lymphocytes Relative 22 %   Lymphs Abs 1.5 1.0 - 3.6 K/uL   Monocytes Relative 11 %   Monocytes Absolute 0.7 0.2 - 1.0 K/uL   Eosinophils Relative 3 %   Eosinophils Absolute 0.2 0 - 0.7 K/uL   Basophils Relative 1 %   Basophils Absolute 0.1 0 - 0.1 K/uL    Comment: Performed at [facility], [address], [place], [place] [address]    Radiology: No results found.  No results found.  No results found.    Assessment and Plan: Patient Active Problem List   Diagnosis Date Noted  . Abdominal abscess 12/13/2015  . Diffuse large b-cell lymphoma, intra-abdominal lymph nodes ([place]) 12/06/2015  . Localized skin mass, lump, or swelling 11/09/2015  . Internal nasal lesion 10/30/2015  . Fitting and adjustment of gastrointestinal appliance and device   . Disease of biliary tract   . History of stroke 07/25/2015  . Annual physical exam 07/03/2015  . Pneumonia 05/15/2015  . Fever 05/15/2015  . Hypoxia 05/07/2015  . Pancreatic mass 01/12/2015  . Obstructive jaundice 12/28/2014  . Arthritis 06/09/2014  . Benign essential HTN 10/04/2013  . 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 Acid reflux 10/04/2013  . Arthritis, degenerative 10/04/2013  . HLD (hyperlipidemia) 10/04/2013  . H/O disease 10/04/2013  . Apnea, sleep 10/04/2013  . Type 2 diabetes mellitus ([place])  10/04/2013  . Stroke ([place]) 09/08/2013  . Obstructive sleep apnea 02/03/2013  . Depression 07/05/2012  . Major depressive disorder with single episode 07/05/2012  . Hyperlipidemia LDL goal <100 12/23/2011  . Atrial fibrillation ([place])   . Hypertension     1. OSA  Continue with CPAP on the  current pressure settings.  Patient is tolerating it well.  His son machine settings were reviewed.  Weaning the device was also reviewed length.  The patient states that he is on going to have his device checked up on a regular basis as prescribed. 2. MORB[place] Obesity  Continues to not do very well as being able to lose weight.  Once again was counseled on diet and exercise. 3. Non Hodgkins Lymphoma  He continues to follow with his oncologist continue with supportive care 4. Atrial fibrillation rate is controlled at this time continue with present management 5. GERD treated stable  General Counseling: I have discussed the findings of the evaluation and examination with Eddie Dibbles.  I have also discussed any further diagnostic evaluation thatmay be needed or ordered today. Gardner verbalizes understanding of the findings of todays visit. We also reviewed his medications today and discussed drug interactions and side effects including but not limited excessive drowsiness and altered mental states. We also discussed that there is always a risk not just to him but also people around him. he has been encouraged to call the office with any questions or concerns that should arise related to todays visit.    Time spent: 96mn  I have personally obtained a history, examined the patient, evaluated laboratory and imaging results, formulated the assessment and plan and placed orders.    SAllyne Gee MD FThe University Of Vermont Health Network Elizabethtown Community HospitalPulmonary and Critical Care Sleep medicine

## 2017-07-28 DIAGNOSIS — M79675 Pain in left toe(s): Secondary | ICD-10-CM | POA: Diagnosis not present

## 2017-07-28 DIAGNOSIS — B351 Tinea unguium: Secondary | ICD-10-CM | POA: Diagnosis not present

## 2017-07-28 DIAGNOSIS — M79674 Pain in right toe(s): Secondary | ICD-10-CM | POA: Diagnosis not present

## 2017-08-05 DIAGNOSIS — Z1211 Encounter for screening for malignant neoplasm of colon: Secondary | ICD-10-CM | POA: Diagnosis not present

## 2017-08-06 ENCOUNTER — Telehealth: Payer: Self-pay | Admitting: Cardiovascular Disease

## 2017-08-06 NOTE — Telephone Encounter (Signed)
Please review for refill, Thanks !  

## 2017-08-06 NOTE — Telephone Encounter (Signed)
°*  STAT* If patient is at the pharmacy, call can be transferred to refill team.   1. Which medications need to be refilled? (please list name of each medication and dose if known)  Xarelto 20 mg po q d   2. Which pharmacy/location (including street and city if local pharmacy) is medication to be sent to? [place]   3. Do they need a 30 day or 90 day supply? [place]

## 2017-08-07 MED ORDER — RIVAROXABAN 20 MG PO TABS: ORAL_TABLET | ORAL | 0 refills | Status: DC

## 2017-08-07 NOTE — Telephone Encounter (Signed)
Refill Request.  

## 2017-08-07 NOTE — Telephone Encounter (Signed)
Patient calling to check status of refill Patient scheduled 10/15/2017 with Dr. Fletcher Anon  Please send in refill

## 2017-08-07 NOTE — Addendum Note (Signed)
Addended by: Erskine Emery on: 08/07/2017 05:17 PM   Modules accepted: Orders

## 2017-08-11 DIAGNOSIS — G4733 Obstructive sleep apnea (adult) (pediatric): Secondary | ICD-10-CM | POA: Diagnosis not present

## 2017-08-11 DIAGNOSIS — G4730 Sleep apnea, unspecified: Secondary | ICD-10-CM | POA: Diagnosis not present

## 2017-09-03 ENCOUNTER — Telehealth: Payer: Self-pay | Admitting: Cardiovascular Disease

## 2017-09-03 NOTE — Telephone Encounter (Signed)
   [facility] Pre-operative Risk Assessment    Request for surgical clearance:  1. What type of surgery is being performed? Colonoscopy  2. When is this surgery scheduled? 09/09/2017  3. What type of clearance is required (medical clearance vs. Pharmacy clearance to hold med vs. Both)? Pharmacy  4. Are there any medications that need to be held prior to surgery and how long? Xarelto  5. Practice name and name of physician performing surgery? [facility]  6. What is your office phone number [phone]   7.   What is your office fax number [phone]  8.   Anesthesia type (None, local, MAC, general) ? monitored   Lucienne Minks 09/03/2017, 8:51 AM  _________________________________________________________________   (provider comments below)

## 2017-09-04 NOTE — Telephone Encounter (Signed)
Clearance faxed to [phone]

## 2017-09-04 NOTE — Telephone Encounter (Signed)
Low risk.  Hold Xarelto 2 days before procedure.

## 2017-09-08 ENCOUNTER — Encounter: Payer: Self-pay | Admitting: Student

## 2017-09-09 ENCOUNTER — Other Ambulatory Visit: Payer: Self-pay | Admitting: Internal Medicine

## 2017-09-09 ENCOUNTER — Ambulatory Visit: Payer: PPO | Admitting: Anesthesiology

## 2017-09-09 ENCOUNTER — Ambulatory Visit
Admission: RE | Admit: 2017-09-09 | Discharge: 2017-09-09 | Disposition: A | Discharge status: Home / Self Care | Payer: PPO | Source: Ambulatory Visit | Attending: Internal Medicine | Admitting: Internal Medicine

## 2017-09-09 ENCOUNTER — Encounter
Admission: RE | Disposition: A | Discharge status: Home / Self Care | Payer: Self-pay | Source: Ambulatory Visit | Attending: Internal Medicine

## 2017-09-09 DIAGNOSIS — Z8673 Personal history of transient ischemic attack (TIA), and cerebral infarction without residual deficits: Secondary | ICD-10-CM | POA: Diagnosis not present

## 2017-09-09 DIAGNOSIS — Z9221 Personal history of antineoplastic chemotherapy: Secondary | ICD-10-CM | POA: Diagnosis not present

## 2017-09-09 DIAGNOSIS — Z1211 Encounter for screening for malignant neoplasm of colon: Secondary | ICD-10-CM | POA: Insufficient documentation

## 2017-09-09 DIAGNOSIS — D127 Benign neoplasm of rectosigmoid junction: Secondary | ICD-10-CM | POA: Insufficient documentation

## 2017-09-09 DIAGNOSIS — K641 Second degree hemorrhoids: Secondary | ICD-10-CM | POA: Diagnosis not present

## 2017-09-09 DIAGNOSIS — G4730 Sleep apnea, unspecified: Secondary | ICD-10-CM | POA: Insufficient documentation

## 2017-09-09 DIAGNOSIS — K219 Gastro-esophageal reflux disease without esophagitis: Secondary | ICD-10-CM | POA: Diagnosis not present

## 2017-09-09 DIAGNOSIS — D120 Benign neoplasm of cecum: Secondary | ICD-10-CM | POA: Insufficient documentation

## 2017-09-09 DIAGNOSIS — K640 First degree hemorrhoids: Secondary | ICD-10-CM | POA: Diagnosis not present

## 2017-09-09 DIAGNOSIS — Z79899 Other long term (current) drug therapy: Secondary | ICD-10-CM | POA: Insufficient documentation

## 2017-09-09 DIAGNOSIS — I4891 Unspecified atrial fibrillation: Secondary | ICD-10-CM | POA: Insufficient documentation

## 2017-09-09 DIAGNOSIS — G4733 Obstructive sleep apnea (adult) (pediatric): Secondary | ICD-10-CM | POA: Diagnosis not present

## 2017-09-09 DIAGNOSIS — K644 Residual hemorrhoidal skin tags: Secondary | ICD-10-CM | POA: Diagnosis not present

## 2017-09-09 DIAGNOSIS — Z8572 Personal history of non-Hodgkin lymphomas: Secondary | ICD-10-CM | POA: Insufficient documentation

## 2017-09-09 DIAGNOSIS — Z7951 Long term (current) use of inhaled steroids: Secondary | ICD-10-CM | POA: Insufficient documentation

## 2017-09-09 DIAGNOSIS — Z7984 Long term (current) use of oral hypoglycemic drugs: Secondary | ICD-10-CM | POA: Diagnosis not present

## 2017-09-09 DIAGNOSIS — K648 Other hemorrhoids: Secondary | ICD-10-CM | POA: Diagnosis not present

## 2017-09-09 DIAGNOSIS — Z7901 Long term (current) use of anticoagulants: Secondary | ICD-10-CM | POA: Insufficient documentation

## 2017-09-09 DIAGNOSIS — K635 Polyp of colon: Secondary | ICD-10-CM | POA: Diagnosis not present

## 2017-09-09 DIAGNOSIS — E119 Type 2 diabetes mellitus without complications: Secondary | ICD-10-CM | POA: Insufficient documentation

## 2017-09-09 DIAGNOSIS — I10 Essential (primary) hypertension: Secondary | ICD-10-CM | POA: Diagnosis not present

## 2017-09-09 DIAGNOSIS — D126 Benign neoplasm of colon, unspecified: Secondary | ICD-10-CM | POA: Diagnosis not present

## 2017-09-09 HISTORY — PX: COLONOSCOPY WITH PROPOFOL: SHX5780

## 2017-09-09 HISTORY — DX: Depression, unspecified: F32.A

## 2017-09-09 HISTORY — DX: Cardiac arrhythmia, unspecified: I49.9

## 2017-09-09 HISTORY — DX: Type 2 diabetes mellitus without complications: E11.9

## 2017-09-09 HISTORY — DX: Major depressive disorder, single episode, unspecified: F32.9

## 2017-09-09 LAB — GLUCOSE, CAPILLARY: Glucose-Capillary: 125 mg/dL — ABNORMAL HIGH (ref 70–99)

## 2017-09-09 SURGERY — COLONOSCOPY WITH PROPOFOL
Anesthesia: General

## 2017-09-09 MED ORDER — PHENYLEPHRINE HCL 10 MG/ML IJ SOLN
INTRAMUSCULAR | Status: DC | PRN
  Administered 2017-09-09 (×2): 150 ug via INTRAVENOUS

## 2017-09-09 MED ORDER — PROPOFOL 10 MG/ML IV BOLUS
INTRAVENOUS | Status: DC | PRN
  Administered 2017-09-09: 35 mg via INTRAVENOUS
  Administered 2017-09-09: 100 mg via INTRAVENOUS

## 2017-09-09 MED ORDER — LIDOCAINE HCL (CARDIAC) PF 100 MG/5ML IV SOSY
PREFILLED_SYRINGE | INTRAVENOUS | Status: DC | PRN
  Administered 2017-09-09: 35 mg via INTRAVENOUS

## 2017-09-09 MED ORDER — SODIUM CHLORIDE 0.9 % IV SOLN
INTRAVENOUS | Status: DC
  Administered 2017-09-09: 1000 mL via INTRAVENOUS

## 2017-09-09 MED ORDER — LIDOCAINE HCL (PF) 2 % IJ SOLN
INTRAMUSCULAR | Status: AC
  Filled 2017-09-09: qty 10

## 2017-09-09 MED ORDER — PROPOFOL 500 MG/50ML IV EMUL
INTRAVENOUS | Status: AC
  Filled 2017-09-09: qty 50

## 2017-09-09 MED ORDER — PROPOFOL 500 MG/50ML IV EMUL
INTRAVENOUS | Status: DC | PRN
  Administered 2017-09-09: 150 ug/kg/min via INTRAVENOUS

## 2017-09-09 NOTE — Transfer of Care (Signed)
Immediate Anesthesia Transfer of Care Note  Patient: Mason Houston  Procedure(s) Performed: COLONOSCOPY WITH PROPOFOL (N/A )  Patient Location: PACU and Endoscopy Unit  Anesthesia Type:General  Level of Consciousness: awake  Airway & Oxygen Therapy: Patient Spontanous Breathing  Post-op Assessment: Report given to RN  Post vital signs: stable  Last Vitals:  Vitals Value Taken Time  BP 88/43 09/09/2017  9:29 AM  Temp 35.9 C 09/09/2017  9:27 AM  Pulse 80 09/09/2017  9:30 AM  Resp 15 09/09/2017  9:30 AM  SpO2 99 % 09/09/2017  9:30 AM  Vitals shown include unvalidated device data.  Last Pain:  Vitals:   09/09/17 0927  TempSrc: Tympanic  PainSc: 0-No pain         Complications: No apparent anesthesia complications

## 2017-09-09 NOTE — Interval H&P Note (Signed)
History and Physical Interval Note:  09/09/2017 8:41 AM  Mason Houston  has presented today for surgery, with the diagnosis of COLON CANCER SCREENING  The various methods of treatment have been discussed with the patient and family. After consideration of risks, benefits and other options for treatment, the patient has consented to  Procedure(s): COLONOSCOPY WITH PROPOFOL (N/A) as a surgical intervention .  The patient's history has been reviewed, patient examined, no change in status, stable for surgery.  I have reviewed the patient's chart and labs.  Questions were answered to the patient's satisfaction.     [place]

## 2017-09-09 NOTE — Op Note (Signed)
[facility] Gastroenterology Patient Name: Mason Houston Procedure Date: 09/09/2017 8:54 AM MRN: [id number] Account #: [id number] Date of Birth: 06-Sep-1950 Admit Type: Outpatient Age: 67 Room: [facility] ENDO ROOM 1 Gender: Male Note Status: Finalized Procedure:            Colonoscopy Indications:          Screening for colorectal malignant neoplasm Providers:            Benay Pike. Alice Reichert MD, MD Referring MD:         Baxter Hire, MD (Referring MD) Medicines:            Propofol per Anesthesia Complications:        No immediate complications. Procedure:            Pre-Anesthesia Assessment:                       - The risks and benefits of the procedure and the                        sedation options and risks were discussed with the                        patient. All questions were answered and informed                        consent was obtained.                       - Patient identification and proposed procedure were                        verified prior to the procedure by the nurse. The                        procedure was verified in the procedure room.                       - ASA Grade Assessment: III - A patient with severe                        systemic disease.                       - After reviewing the risks and benefits, the patient                        was deemed in satisfactory condition to undergo the                        procedure.                       After obtaining informed consent, the colonoscope was                        passed under direct vision. Throughout the procedure,                        the patient's blood pressure, pulse, and oxygen  saturations were monitored continuously. The                        Colonoscope was introduced through the anus and                        advanced to the the cecum, identified by appendiceal                        orifice and ileocecal valve. The colonoscopy was                     performed without difficulty. The patient tolerated the                        procedure well. The quality of the bowel preparation                        was good. The ileocecal valve, appendiceal orifice, and                        rectum were photographed. Findings:      The perianal exam findings include internal hemorrhoids that prolapse       with straining, but spontaneously regress to the resting position (Grade       II).      A 8 mm polyp was found in the cecum. The polyp was semi-pedunculated.       The polyp was removed with a hot snare. Resection and retrieval were       complete. To prevent bleeding after the polypectomy, one hemostatic clip       was successfully placed (MR conditional). There was no bleeding during,       or at the end, of the procedure.      A 7 mm polyp was found in the cecum. The polyp was semi-pedunculated.       The polyp was removed with a hot snare. Resection and retrieval were       complete. To prevent bleeding after the polypectomy, one hemostatic clip       was successfully placed (MR conditional). There was no bleeding during,       or at the end, of the procedure.      A 10 mm polyp was found in the recto-sigmoid colon. The polyp was       pedunculated. The polyp was removed with a hot snare. Resection and       retrieval were complete.      Non-bleeding internal hemorrhoids were found during retroflexion. The       hemorrhoids were Grade I (internal hemorrhoids that do not prolapse).      The exam was otherwise without abnormality. Impression:           - Internal hemorrhoids that prolapse with straining,                        but spontaneously regress to the resting position                        (Grade II) found on perianal exam.                       - One 8 mm polyp in  the cecum, removed with a hot                        snare. Resected and retrieved. Clip (MR conditional)                        was placed.                        - One 7 mm polyp in the cecum, removed with a hot                        snare. Resected and retrieved. Clip (MR conditional)                        was placed.                       - One 10 mm polyp at the recto-sigmoid colon, removed                        with a hot snare. Resected and retrieved.                       - Non-bleeding internal hemorrhoids.                       - The examination was otherwise normal. Recommendation:       - Patient has a contact number available for                        emergencies. The signs and symptoms of potential                        delayed complications were discussed with the patient.                        Return to normal activities tomorrow. Written discharge                        instructions were provided to the patient.                       - Resume previous diet.                       - Continue present medications.                       - Await pathology results.                       - Repeat colonoscopy is recommended for surveillance.                        The colonoscopy date will be determined after pathology                        results from today's exam become available for review.                       - Return to GI office PRN.                       -  Resume Xarelto (rivaroxaban) at prior dose tomorrow.                        Refer to managing physician for further adjustment of                        therapy.                       - The findings and recommendations were discussed with                        the patient and their spouse. Procedure Code(s):    --- Professional ---                       [id number], Colonoscopy, flexible; with removal of tumor(s),                        polyp(s), or other lesion(s) by snare technique Diagnosis Code(s):    --- Professional ---                       K64.1, Second degree hemorrhoids                       D12.7, Benign neoplasm of rectosigmoid junction                        D12.0, Benign neoplasm of cecum                       Z12.11, Encounter for screening for malignant neoplasm                        of colon CPT copyright 2017 American Medical Association. All rights reserved. The codes documented in this report are preliminary and upon coder review may  be revised to meet current compliance requirements. Efrain Sella MD, MD 09/09/2017 9:29:09 AM This report has been signed electronically. Number of Addenda: 0 Note Initiated On: 09/09/2017 8:54 AM Scope Withdrawal Time: 0 hours 13 minutes 27 seconds  Total Procedure Duration: 0 hours 16 minutes 6 seconds       [facility]

## 2017-09-09 NOTE — H&P (Signed)
Outpatient short stay form Pre-procedure 09/09/2017 8:40 AM  Mason Houston, M.D.  Primary Physician: Mason Houston, M.D.  Reason for visit:  Colon cancer screening.  History of present illness:  Patient presents for colon cancer screening. The patient denies abdominal pain, abnormal weight loss or rectal bleeding. Patient takes Xarelto for atrial fibrillation but has held the medication without any doses in the past 48 hours. 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    Current Facility-Administered Medications:  .  0.9 %  sodium chloride infusion, , Intravenous, Continuous, , Mason Pike, MD  Medications Prior to Admission  Medication Sig Dispense Refill Last Dose  . ciclopirox (PENLAC) 8 % solution Apply topically at bedtime. Apply over nail and surrounding skin. Apply daily over previous coat. After seven (7) days, may remove with alcohol and continue cycle.     . diltiazem (TIAZAC) 360 MG 24 hr capsule TAKE 1 CAPSULE BY MOUTH EVERY DAY 90 capsule 3 09/09/2017 at Unknown time  . losartan (COZAAR) 50 MG tablet Take 1 tablet (50 mg total) by mouth daily. 90 tablet 3 09/09/2017 at Unknown time  . acetaminophen (TYLENOL) 650 MG CR tablet Take 1,300 mg by mouth daily.   Taking  . Ascorbic Acid (VITAMIN C) 1000 MG tablet Take 1,000 mg by mouth daily.   Taking  . b complex vitamins capsule Take 1 capsule by mouth daily.    Taking  . cholecalciferol (VITAMIN D) 1000 units tablet Take 1,000 Units by mouth daily.   Taking  . fluticasone (FLONASE) 50 MCG/ACT nasal spray Place 1 spray into both nostrils daily.   Taking  . loratadine (CLARITIN) 10 MG tablet Take 10 mg by mouth daily.   Taking  . magnesium oxide (MAG-OX) 400 (241.3 Mg) MG tablet Take 250 mg daily by mouth.    Taking  . metFORMIN (GLUCOPHAGE) 500 MG tablet Take 500 mg by mouth 2 (two) times daily with a meal.  11 Taking  . omeprazole (PRILOSEC) 20 MG capsule TAKE 1 CAPSULE BY MOUTH ONCE DAILY 90 capsule  0 Taking  . rivaroxaban (XARELTO) 20 MG TABS tablet TAKE 1 TABLET BY MOUTH ONCE DAILY WITH SUPPER 90 tablet 0   . rosuvastatin (CRESTOR) 10 MG tablet TAKE 1 TABLET(10 MG) BY MOUTH DAILY 90 tablet 0 Taking     Allergies  Allergen Reactions  . Celebrex [Celecoxib] Hives  . Other Itching    FAB LAUNDRY DETERGENT (itching)  . Penicillins Hives and Other (See Comments)    Has patient had a PCN reaction causing immediate rash, facial/tongue/throat swelling, SOB or lightheadedness with hypotension: No Has patient had a PCN reaction causing severe rash involving mucus membranes or skin necrosis: No Has patient had a PCN reaction that required hospitalization No Has patient had a PCN reaction occurring within the last 10 years: No If all of the above answers are "NO", then may proceed with Cephalosporin use.     Past Medical History:  Diagnosis Date  . Atrial fibrillation ([place])   . Cancer ([place])   . Collagen vascular disease ([place])   . Depression   . Diabetes mellitus without complication ([place])   . Dysrhythmia   . Fibromyalgia   . GERD (gastroesophageal reflux disease)   . H/O Bell's palsy   . History of [place] measles   . Hypertension   . Mini stroke ([place])   . Non Hodgkin's lymphoma ([place]) 2017  . Osteoarthrosis, unspecified whether generalized or localized, lower leg   .  Sixth cranial nerve palsy   . Sleep apnea   . Stroke [facility])     Review of systems:  Otherwise negative.    Physical Exam  Gen: Alert, oriented. Appears stated age.  HEENT: /AT. PERRLA. Lungs: CTA, no wheezes. CV: RR nl S1, S2. Abd: soft, benign, no masses. BS+ Ext: No edema. Pulses 2+    Planned procedures: Proceed with colonoscopy. The patient understands the nature of the planned procedure, indications, risks, alternatives and potential complications including but not limited to bleeding, infection, perforation, damage to internal organs and possible oversedation/side effects from anesthesia. The  patient agrees and gives consent to proceed.  Please refer to procedure notes for findings, recommendations and patient disposition/instructions.      Mason Houston, M.D. Gastroenterology 09/09/2017  8:40 AM

## 2017-09-09 NOTE — Anesthesia Preprocedure Evaluation (Addendum)
Anesthesia Evaluation  Patient identified by MRN, date of birth, ID band Patient awake    Reviewed: Allergy & Precautions, H&P , NPO status , Patient's Chart, lab work & pertinent test results  Airway Mallampati: III   Neck ROM: full   Comment: TM 3 FB Large neck Dental  (+) Edentulous Lower, Upper Dentures   Pulmonary neg pulmonary ROS, sleep apnea , former smoker,           Cardiovascular hypertension, (-) angina(-) Past MI, (-) Cardiac Stents and (-) CABG negative cardio ROS  + dysrhythmias Atrial Fibrillation      Neuro/Psych PSYCHIATRIC DISORDERS Depression  Neuromuscular disease (fibromyalgia) CVA negative neurological ROS  negative psych ROS   GI/Hepatic negative GI ROS, Neg liver ROS, GERD  ,  Endo/Other  negative endocrine ROSdiabetesMorbid obesity  Renal/GU negative Renal ROS  negative genitourinary   Musculoskeletal  (+) Arthritis , Fibromyalgia -  Abdominal   Peds  Hematology negative hematology ROS (+)   Anesthesia Other Findings Past Medical History: No date: Atrial fibrillation (HCC) No date: Cancer ([place]) No date: Collagen vascular disease (H[facility]) No date: Depression No date: Diabetes mellitus without complication ([facility]) No date: Dysrhythmia No date: Fibromyalgia No date: GERD (gastroesophageal reflux disease) No date: H/O Bell's palsy No date: History of [place] measles No date: Hypertension No date: Mini stroke ([place]) 2017: Non Hodgkin's lymphoma ([place]) No date: Osteoarthrosis, unspecified whether generalized or localized,  lower leg 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 No date: Sleep apnea No date: Stroke [facility])  Past Surgical History: 2012: COLONOSCOPY     Comment:  Dr Candace Cruise No date: CYST EXCISION 12/29/2014: ERCP; N/A     Comment:  Procedure: ENDOSCOPIC RETROGRADE               CHOLANGIOPANCREATOGRAPHY (ERCP);  Surgeon: Hulen Luster, MD;              Location: [facility] ENDOSCOPY;  Service:  Endoscopy;                Laterality: N/A; 02/21/2015: ERCP; N/A     Comment:  Procedure: ENDOSCOPIC RETROGRADE               CHOLANGIOPANCREATOGRAPHY (ERCP);  Surgeon: Hulen Luster, MD;              Location: [facility] ENDOSCOPY;  Service: Gastroenterology;                Laterality: N/A; 08/14/2015: ERCP; N/A     Comment:  Procedure: ENDOSCOPIC RETROGRADE               CHOLANGIOPANCREATOGRAPHY (ERCP) Stent removal;  Surgeon:               [name], MD;  Location: [facility] ENDOSCOPY;  Service:               Endoscopy;  Laterality: N/A; 01/31/2015: PERIPHERAL VASCULAR CATHETERIZATION; N/A     Comment:  Procedure: Porta Cath Insertion;  Surgeon: Katha Cabal, MD;  Location: [facility] CV LAB;  Service:               Cardiovascular;  Laterality: N/A; 12/18/2015: PERIPHERAL VASCULAR CATHETERIZATION; N/A     Comment:  Procedure: Porta Cath Removal;  Surgeon: Katha Cabal, MD;  Location: [place] CV LAB;  Service:  Cardiovascular;  Laterality: N/A; No date: TRIGGER FINGER RELEASE  BMI    Body Mass Index:  41.50 kg/m      Reproductive/Obstetrics negative OB ROS                            Anesthesia Physical Anesthesia Plan  ASA: III  Anesthesia Plan: General   Post-op Pain Management:    Induction:   PONV Risk Score and Plan: Propofol infusion and TIVA  Airway Management Planned:   Additional Equipment:   Intra-op Plan:   Post-operative Plan:   Informed Consent: I have reviewed the patients History and Physical, chart, labs and discussed the procedure including the risks, benefits and alternatives for the proposed anesthesia with the patient or authorized representative who has indicated his/her understanding and acceptance.   Dental Advisory Given  Plan Discussed with: Anesthesiologist, CRNA and Surgeon  Anesthesia Plan Comments:        Anesthesia Quick Evaluation

## 2017-09-09 NOTE — Anesthesia Post-op Follow-up Note (Signed)
Anesthesia QCDR form completed.        

## 2017-09-10 ENCOUNTER — Encounter: Payer: Self-pay | Admitting: Internal Medicine

## 2017-09-10 DIAGNOSIS — G4733 Obstructive sleep apnea (adult) (pediatric): Secondary | ICD-10-CM | POA: Diagnosis not present

## 2017-09-10 DIAGNOSIS — G4730 Sleep apnea, unspecified: Secondary | ICD-10-CM | POA: Diagnosis not present

## 2017-09-10 NOTE — Anesthesia Postprocedure Evaluation (Signed)
Anesthesia Post Note  Patient: Mason Houston  Procedure(s) Performed: COLONOSCOPY WITH PROPOFOL (N/A )  Patient location during evaluation: PACU Anesthesia Type: General Level of consciousness: awake and alert Pain management: pain level controlled Vital Signs Assessment: post-procedure vital signs reviewed and stable Respiratory status: spontaneous breathing, nonlabored ventilation, respiratory function stable and patient connected to nasal cannula oxygen Cardiovascular status: blood pressure returned to baseline and stable Postop Assessment: no apparent nausea or vomiting Anesthetic complications: no     Last Vitals:  Vitals:   09/09/17 1005 09/09/17 1016  BP: 107/66 110/70  Pulse:    Resp:    Temp:    SpO2:      Last Pain:  Vitals:   09/10/17 0730  TempSrc:   PainSc: 0-No pain                 Durenda Hurt

## 2017-09-11 LAB — SURGICAL PATHOLOGY

## 2017-09-13 ENCOUNTER — Other Ambulatory Visit: Payer: Self-pay | Admitting: Internal Medicine

## 2017-10-07 DIAGNOSIS — E118 Type 2 diabetes mellitus with unspecified complications: Secondary | ICD-10-CM | POA: Diagnosis not present

## 2017-10-07 DIAGNOSIS — Z1329 Encounter for screening for other suspected endocrine disorder: Secondary | ICD-10-CM | POA: Diagnosis not present

## 2017-10-14 DIAGNOSIS — Z6841 Body Mass Index (BMI) 40.0 and over, adult: Secondary | ICD-10-CM | POA: Diagnosis not present

## 2017-10-14 DIAGNOSIS — Z23 Encounter for immunization: Secondary | ICD-10-CM | POA: Diagnosis not present

## 2017-10-14 DIAGNOSIS — I10 Essential (primary) hypertension: Secondary | ICD-10-CM | POA: Diagnosis not present

## 2017-10-14 DIAGNOSIS — E119 Type 2 diabetes mellitus without complications: Secondary | ICD-10-CM | POA: Diagnosis not present

## 2017-10-14 DIAGNOSIS — C8333 Diffuse large B-cell lymphoma, intra-abdominal lymph nodes: Secondary | ICD-10-CM | POA: Diagnosis not present

## 2017-10-14 DIAGNOSIS — G4733 Obstructive sleep apnea (adult) (pediatric): Secondary | ICD-10-CM | POA: Diagnosis not present

## 2017-10-14 DIAGNOSIS — F325 Major depressive disorder, single episode, in full remission: Secondary | ICD-10-CM | POA: Diagnosis not present

## 2017-10-14 DIAGNOSIS — I480 Paroxysmal atrial fibrillation: Secondary | ICD-10-CM | POA: Diagnosis not present

## 2017-10-15 ENCOUNTER — Ambulatory Visit: Payer: PPO | Admitting: Cardiovascular Disease

## 2017-10-15 ENCOUNTER — Encounter: Payer: Self-pay | Admitting: Cardiovascular Disease

## 2017-10-15 VITALS — BP 120/78 | HR 84 | Ht 67.5 in | Wt 263.0 lb

## 2017-10-15 DIAGNOSIS — I4819 Other persistent atrial fibrillation: Secondary | ICD-10-CM

## 2017-10-15 DIAGNOSIS — E785 Hyperlipidemia, unspecified: Secondary | ICD-10-CM | POA: Diagnosis not present

## 2017-10-15 DIAGNOSIS — I10 Essential (primary) hypertension: Secondary | ICD-10-CM

## 2017-10-15 NOTE — Progress Notes (Signed)
Cardiology Office Note   Date:  10/15/2017   ID:  Mason Houston, DOB 1950-11-06, MRN [id number]  PCP:  Baxter Hire, MD  Cardiologist:   Kathlyn Sacramento, MD   Chief Complaint  Patient presents with  . other    6 month f/u c/o highly stressed issues at home. Meds reviewed verbally with pt.      History of Present Illness: Mason Houston is a 67 y.o. male who presents for a followup visit regarding persistent atrial fibrillation.  The patient has a long history of well-controlled hypertension and obesity. He was diagnosed with atrial fibrillation with rapid ventricular response during a routine physical  in December of 2013 in the setting of heavy caffeine intake. He also has sleep apnea currently effectively treated with CPAP.  He had a stroke in August 2015.   He was diagnosed with non-Hodgkin's lymphoma in 2017 and was treated successfully with chemotherapy. Echocardiogram In July 2017 showed normal LV systolic function.   He was noted to be in atrial fibrillation last year but was asymptomatic.  I continued with rate control.  His blood pressure was elevated.  Losartan was added and since then his blood pressure has been controlled.  He denies any chest pain, shortness of breath or palpitations.  He continues to report significant stress at home due to dealing with his stepsons.  Past Medical History:  Diagnosis Date  . Atrial fibrillation ([place])   . Cancer ([place])   . Collagen vascular disease ([place])   . Depression   . Diabetes mellitus without complication ([place])   . Dysrhythmia   . Fibromyalgia   . GERD (gastroesophageal reflux disease)   . H/O Bell's palsy   . History of [place] measles   . Hypertension   . Mini stroke ([place])   . Non Hodgkin's lymphoma ([place]) 2017  . Osteoarthrosis, unspecified whether generalized or localized, lower leg   . Sixth cranial nerve palsy   . Sleep apnea   . Stroke [facility])     Past Surgical History:  Procedure Laterality Date  .  COLONOSCOPY  2012   Dr Candace Cruise  . COLONOSCOPY WITH PROPOFOL N/A 09/09/2017   Procedure: COLONOSCOPY WITH PROPOFOL;  Surgeon: Toledo, Benay Pike, MD;  Location: [facility] ENDOSCOPY;  Service: Gastroenterology;  Laterality: N/A;  . CYST EXCISION    . ERCP N/A 12/29/2014   Procedure: ENDOSCOPIC RETROGRADE CHOLANGIOPANCREATOGRAPHY (ERCP);  Surgeon: Hulen Luster, MD;  Location: [facility] ENDOSCOPY;  Service: Endoscopy;  Laterality: N/A;  . ERCP N/A 02/21/2015   Procedure: ENDOSCOPIC RETROGRADE CHOLANGIOPANCREATOGRAPHY (ERCP);  Surgeon: Hulen Luster, MD;  Location[facility] ENDOSCOPY;  Service: Gastroenterology;  Laterality: N/A;  . ERCP N/A 08/14/2015   Procedure: ENDOSCOPIC RETROGRADE CHOLANGIOPANCREATOGRAPHY (ERCP) Stent removal;  Surgeon: [name], MD;  Location: [facility] ENDOSCOPY;  Service: Endoscopy;  Laterality: N/A;  . PERIPHERAL VASCULAR CATHETERIZATION N/A 01/31/2015   Procedure: Glori Luis Cath Insertion;  Surgeon: Katha Cabal, MD;  Location: [place] CV LAB;  Service: Cardiovascular;  Laterality: N/A;  . PERIPHERAL VASCULAR CATHETERIZATION N/A 12/18/2015   Procedure: Glori Luis Cath Removal;  Surgeon: Katha Cabal, MD;  Location: [place] CV LAB;  Service: Cardiovascular;  Laterality: N/A;  . TRIGGER FINGER RELEASE       Current Outpatient Medications  Medication Sig Dispense Refill  . acetaminophen (TYLENOL) 650 MG CR tablet Take 1,300 mg by mouth daily.    . Ascorbic Acid (VITAMIN C) 1000 MG tablet Take 1,000 mg by mouth daily.    Marland Kitchen  b complex vitamins capsule Take 1 capsule by mouth daily.     . cholecalciferol (VITAMIN D) 1000 units tablet Take 1,000 Units by mouth daily.    . ciclopirox (PENLAC) 8 % solution Apply topically at bedtime. Apply over nail and surrounding skin. Apply daily over previous coat. After seven (7) days, may remove with alcohol and continue cycle.    . diltiazem (TIAZAC) 360 MG 24 hr capsule TAKE 1 CAPSULE BY MOUTH EVERY DAY 90 capsule 3  . fluticasone (FLONASE) 50 MCG/ACT nasal  spray Place 1 spray into both nostrils daily.    Marland Kitchen loratadine (CLARITIN) 10 MG tablet Take 10 mg by mouth daily.    Marland Kitchen losartan (COZAAR) 50 MG tablet Take 1 tablet (50 mg total) by mouth daily. 90 tablet 3  . magnesium oxide (MAG-OX) 400 (241.3 Mg) MG tablet Take 250 mg daily by mouth.     . metFORMIN (GLUCOPHAGE) 500 MG tablet Take 500 mg by mouth 2 (two) times daily with a meal.  11  . omeprazole (PRILOSEC) 20 MG capsule TAKE 1 CAPSULE BY MOUTH ONCE DAILY 90 capsule 0  . rivaroxaban (XARELTO) 20 MG TABS tablet TAKE 1 TABLET BY MOUTH ONCE DAILY WITH SUPPER 90 tablet 0  . rosuvastatin (CRESTOR) 10 MG tablet TAKE 1 TABLET(10 MG) BY MOUTH DAILY 90 tablet 0   No current facility-administered medications for this visit.     Allergies:   Celebrex [celecoxib]; Other; and Penicillins    Social History:  The patient  reports that he has quit smoking. His smoking use included cigarettes. He has a 67.50 pack-year smoking history. He has never used smokeless tobacco. He reports that he does not drink alcohol or use drugs.   Family History:  The patient's family history includes Heart attack in his father; Hypertension in his mother; Stroke in his father.    ROS:  Please see the history of present illness.   Otherwise, review of systems are positive for none.   All other systems are reviewed and negative.    PHYSICAL EXAM: VS:  BP 120/78 (BP Location: Left Arm, Patient Position: Sitting, Cuff Size: Normal)   Pulse 84   Ht 5' 7.5" (1.715 m)   Wt 263 lb (119.3 kg)   BMI 40.58 kg/m  , BMI Body mass index is 40.58 kg/m. GEN: Well nourished, well developed, in no acute distress  HEENT: normal  Neck: no JVD, carotid bruits, or masses Cardiac: Irregularly irregular; no murmurs, rubs, or gallops, trace leg edema  Respiratory:  clear to auscultation bilaterally, normal work of breathing GI: soft, nontender, nondistended, + BS MS: no deformity or atrophy  Skin: warm and dry, no rash Neuro:  Strength  and sensation are intact Psych: euthymic mood, full affect   EKG:  EKG is ordered today. The ekg ordered today demonstrates atrial fibrillation with right bundle branch block.   Recent Labs: 05/26/2017: ALT 23; BUN 20; Creatinine, Ser 0.94; Hemoglobin 13.1; Platelets 335; Potassium 4.1; Sodium 138    Lipid Panel    Component Value Date/Time   CHOL 139 05/08/2016 1152   CHOL 181 07/03/2015 1007   TRIG 69 05/08/2016 1152   HDL 42 05/08/2016 1152   HDL 44 07/03/2015 1007   CHOLHDL 3.3 05/08/2016 1152   VLDL 14 05/08/2016 1152   LDLCALC 83 05/08/2016 1152   LDLCALC 122 (H) 07/03/2015 1007      Wt Readings from Last 3 Encounters:  10/15/17 263 lb (119.3 kg)  09/09/17 265 lb (120.2  kg)  07/23/17 271 lb 12.8 oz (123.3 kg)        ASSESSMENT AND PLAN:  1.  Persistent atrial fibrillation: The patient might be transitioning to permanent atrial fibrillation although he reports that occasionally he has regular heart rate at home on the blood pressure machine.  Regardless, he appears to be asymptomatic even when in atrial fibrillation.  Thus, I recommend continuing rate control with diltiazem.   Continue long-term anticoagulation with Xarelto.  Recent labs were unremarkable  2. Essential hypertension: Blood pressure is now controlled since the addition of losartan.  3.  Hyperlipidemia: Continue treatment with rosuvastatin.  Consider a target LDL of less than 70 given that she is diabetic.   Disposition:   FU with me in 6 months  Signed,  Kathlyn Sacramento, MD  10/15/2017 11:29 AM    [place]

## 2017-10-15 NOTE — Patient Instructions (Signed)
Medication Instructions: Continue same medications.   Labwork: None.   Procedures/Testing: None.   Follow-Up: 6 months with Dr. Alezandra Egli.   Any Additional Special Instructions Will Be Listed Below (If Applicable).     If you need a refill on your cardiac medications before your next appointment, please call your pharmacy.   

## 2017-10-28 DIAGNOSIS — Z6841 Body Mass Index (BMI) 40.0 and over, adult: Secondary | ICD-10-CM | POA: Diagnosis not present

## 2017-10-28 DIAGNOSIS — Z6379 Other stressful life events affecting family and household: Secondary | ICD-10-CM | POA: Diagnosis not present

## 2017-10-28 DIAGNOSIS — I639 Cerebral infarction, unspecified: Secondary | ICD-10-CM | POA: Diagnosis not present

## 2017-11-06 ENCOUNTER — Other Ambulatory Visit: Payer: Self-pay

## 2017-11-06 DIAGNOSIS — G4733 Obstructive sleep apnea (adult) (pediatric): Secondary | ICD-10-CM | POA: Diagnosis not present

## 2017-11-06 DIAGNOSIS — G4730 Sleep apnea, unspecified: Secondary | ICD-10-CM | POA: Diagnosis not present

## 2017-11-06 MED ORDER — LOSARTAN POTASSIUM 50 MG PO TABS: 50.0000 mg | ORAL_TABLET | Freq: Every day | ORAL | 1 refills | Status: DC

## 2017-11-17 ENCOUNTER — Other Ambulatory Visit: Payer: Self-pay | Admitting: Internal Medicine

## 2017-11-17 DIAGNOSIS — C8333 Diffuse large B-cell lymphoma, intra-abdominal lymph nodes: Secondary | ICD-10-CM

## 2017-11-24 ENCOUNTER — Inpatient Hospital Stay: Payer: PPO

## 2017-11-24 ENCOUNTER — Inpatient Hospital Stay: Payer: PPO | Attending: Internal Medicine | Admitting: Internal Medicine

## 2017-11-24 ENCOUNTER — Encounter: Payer: Self-pay | Admitting: Internal Medicine

## 2017-11-24 VITALS — BP 136/83 | HR 76 | Temp 97.8°F | Resp 16 | Wt 266.6 lb

## 2017-11-24 DIAGNOSIS — C8333 Diffuse large B-cell lymphoma, intra-abdominal lymph nodes: Secondary | ICD-10-CM

## 2017-11-24 DIAGNOSIS — Z7984 Long term (current) use of oral hypoglycemic drugs: Secondary | ICD-10-CM | POA: Insufficient documentation

## 2017-11-24 DIAGNOSIS — I10 Essential (primary) hypertension: Secondary | ICD-10-CM | POA: Insufficient documentation

## 2017-11-24 DIAGNOSIS — Z9221 Personal history of antineoplastic chemotherapy: Secondary | ICD-10-CM | POA: Diagnosis not present

## 2017-11-24 DIAGNOSIS — Z79899 Other long term (current) drug therapy: Secondary | ICD-10-CM | POA: Insufficient documentation

## 2017-11-24 DIAGNOSIS — Z87891 Personal history of nicotine dependence: Secondary | ICD-10-CM | POA: Insufficient documentation

## 2017-11-24 DIAGNOSIS — C8338 Diffuse large B-cell lymphoma, lymph nodes of multiple sites: Secondary | ICD-10-CM | POA: Diagnosis not present

## 2017-11-24 DIAGNOSIS — I480 Paroxysmal atrial fibrillation: Secondary | ICD-10-CM | POA: Diagnosis not present

## 2017-11-24 DIAGNOSIS — E119 Type 2 diabetes mellitus without complications: Secondary | ICD-10-CM | POA: Diagnosis not present

## 2017-11-24 DIAGNOSIS — Z7901 Long term (current) use of anticoagulants: Secondary | ICD-10-CM | POA: Insufficient documentation

## 2017-11-24 LAB — CBC WITH DIFFERENTIAL/PLATELET
Abs Immature Granulocytes: 0.01 10*3/uL (ref 0.00–0.07)
Basophils Absolute: 0.1 10*3/uL (ref 0.0–0.1)
Basophils Relative: 1 %
Eosinophils Absolute: 0.3 10*3/uL (ref 0.0–0.5)
Eosinophils Relative: 4 %
HCT: 44.3 % (ref 39.0–52.0)
Hemoglobin: 14.2 g/dL (ref 13.0–17.0)
Immature Granulocytes: 0 %
Lymphocytes Relative: 28 %
Lymphs Abs: 1.6 10*3/uL (ref 0.7–4.0)
MCH: 26.6 pg (ref 26.0–34.0)
MCHC: 32.1 g/dL (ref 30.0–36.0)
MCV: 83.1 fL (ref 80.0–100.0)
Monocytes Absolute: 0.6 10*3/uL (ref 0.1–1.0)
Monocytes Relative: 11 %
Neutro Abs: 3.4 10*3/uL (ref 1.7–7.7)
Neutrophils Relative %: 56 %
Platelets: 352 10*3/uL (ref 150–400)
RBC: 5.33 MIL/uL (ref 4.22–5.81)
RDW: 14.6 % (ref 11.5–15.5)
WBC: 6 10*3/uL (ref 4.0–10.5)
nRBC: 0 % (ref 0.0–0.2)

## 2017-11-24 LAB — COMPREHENSIVE METABOLIC PANEL
ALT: 25 U/L (ref 0–44)
AST: 29 U/L (ref 15–41)
Albumin: 4.3 g/dL (ref 3.5–5.0)
Alkaline Phosphatase: 77 U/L (ref 38–126)
Anion gap: 10 (ref 5–15)
BUN: 23 mg/dL (ref 8–23)
CO2: 29 mmol/L (ref 22–32)
Calcium: 9.2 mg/dL (ref 8.9–10.3)
Chloride: 100 mmol/L (ref 98–111)
Creatinine, Ser: 1.16 mg/dL (ref 0.61–1.24)
GFR calc Af Amer: >60 mL/min (ref >60)
GFR calc non Af Amer: >60 mL/min (ref >60)
Glucose, Bld: 158 mg/dL — ABNORMAL HIGH (ref 70–99)
Potassium: 4.7 mmol/L (ref 3.5–5.1)
Sodium: 139 mmol/L (ref 135–145)
Total Bilirubin: 0.9 mg/dL (ref 0.3–1.2)
Total Protein: 7.8 g/dL (ref 6.5–8.1)

## 2017-11-24 LAB — LACTATE DEHYDROGENASE: LDH: 127 U/L (ref 98–192)

## 2017-11-24 NOTE — Progress Notes (Signed)
Mason Houston  Patient Care Team: Baxter Hire, MD as PCP - General (Internal Medicine) [name], MD (Infectious Diseases) Wellington Hampshire, MD as Consulting Physician (Cardiology) Bary Castilla Forest Gleason, MD (General Surgery) Roselee Nova, MD as Referring Physician [facility] Medicine)  Cancer Staging No matching staging information was found for the patient.   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  male pleasant patient above history of diffuse large B-cell lymphoma is here for follow-up.  Patient states that she is under a lot of stress at home given relationship issues within his family.  Otherwise denies any abdominal pain.  Denies any nausea vomiting.  Denies any headaches.  No abdominal pain.  Mason Houston has been trying to lose weight.  Mason Houston had a recent colonoscopy.  Review of Systems  Constitutional: Negative for chills, diaphoresis, fever, malaise/fatigue and weight loss.  HENT: Negative for nosebleeds and sore throat.   Eyes: Negative for double vision.  Respiratory:  Negative for cough, hemoptysis, sputum production, shortness of breath and wheezing.   Cardiovascular: Negative for chest pain, palpitations, orthopnea and leg swelling.  Gastrointestinal: Negative for abdominal pain, blood in stool, constipation, diarrhea, heartburn, melena, nausea and vomiting.  Genitourinary: Negative for dysuria, frequency and urgency.  Musculoskeletal: Negative for back pain and joint pain.  Skin: Negative.  Negative for itching and rash.  Neurological: Negative for tingling, focal weakness, weakness and headaches.  Endo/Heme/Allergies: Does not bruise/bleed easily.  Psychiatric/Behavioral: Negative for depression. The patient is nervous/anxious. The patient does not have insomnia.       PAST MEDICAL HISTORY :  Past Medical History:  Diagnosis Date  . Atrial fibrillation ([place])   . Cancer ([place])   . Collagen vascular disease ([place])   . Depression   . Diabetes mellitus without complication ([place])   . Dysrhythmia   . Fibromyalgia   . GERD (gastroesophageal reflux disease)   . H/O Bell's palsy   . History of [place] measles   . Hypertension   . Mini stroke ([place])   . Non Hodgkin's lymphoma ([place]) 2017  . Osteoarthrosis, unspecified whether generalized or localized, lower leg   . Sixth cranial nerve palsy   . Sleep apnea   . Stroke [facility])     PAST SURGICAL HISTORY :   Past Surgical History:  Procedure Laterality Date  . COLONOSCOPY  2012   Dr Candace Cruise  . COLONOSCOPY WITH PROPOFOL N/A 09/09/2017   Procedure: COLONOSCOPY WITH PROPOFOL;  Surgeon: Toledo, Benay Pike, MD;  Location: [facility] ENDOSCOPY;  Service: Gastroenterology;  Laterality: N/A;  . CYST EXCISION    . ERCP N/A 12/29/2014   Procedure: ENDOSCOPIC RETROGRADE CHOLANGIOPANCREATOGRAPHY (ERCP);  Surgeon: Hulen Luster, MD;  Location: [facility] ENDOSCOPY;  Service: Endoscopy;  Laterality: N/A;  . ERCP N/A 02/21/2015   Procedure: ENDOSCOPIC RETROGRADE CHOLANGIOPANCREATOGRAPHY (ERCP);  Surgeon: Hulen Luster, MD;  Location: [facility]  ENDOSCOPY;  Service: Gastroenterology;  Laterality: N/A;  . ERCP N/A 08/14/2015   Procedure: ENDOSCOPIC RETROGRADE CHOLANGIOPANCREATOGRAPHY (ERCP) Stent removal;  Surgeon: [name], MD;  Location: [facility] ENDOSCOPY;  Service: Endoscopy;  Laterality: N/A;  . PERIPHERAL VASCULAR CATHETERIZATION N/A 01/31/2015   Procedure: Glori Luis Cath Insertion;  Surgeon: Katha Cabal, MD;  Location: [place] CV LAB;  Service: Cardiovascular;  Laterality: N/A;  . PERIPHERAL VASCULAR CATHETERIZATION N/A 12/18/2015   Procedure: Glori Luis Cath Removal;  Surgeon: Katha Cabal, MD;  Location: [place] CV LAB;  Service: Cardiovascular;  Laterality: N/A;  . TRIGGER FINGER RELEASE      FAMILY HISTORY :   Family History  Problem Relation Age of Onset  . Hypertension Mother   . Heart attack Father   . Stroke Father     SOCIAL HISTORY:   Social History   Tobacco Use  . Smoking status: Former Smoker    Packs/day: 4.50    Years: 15.00    Pack years: 67.50    Types: Cigarettes  . Smokeless tobacco: Never Used  Substance Use Topics  . Alcohol use: No  . Drug use: No    Types: Marijuana    Comment: PAST    ALLERGIES:  is allergic to celebrex [celecoxib]; other; and penicillins.  MEDICATIONS:  Current Outpatient Medications  Medication Sig Dispense Refill  . acetaminophen (TYLENOL) 650 MG CR tablet Take 1,300 mg by mouth daily.    . Ascorbic Acid (VITAMIN C) 1000 MG tablet Take 1,000 mg by mouth daily.    Marland Kitchen b complex vitamins capsule Take 1 capsule by mouth daily.     . cholecalciferol (VITAMIN D) 1000 units tablet Take 1,000 Units by mouth daily.    . ciclopirox (PENLAC) 8 % solution Apply topically at bedtime. Apply over nail and surrounding skin. Apply daily over previous coat. After seven (7) days, may remove with alcohol and continue cycle.    . diltiazem (TIAZAC) 360 MG 24 hr capsule TAKE 1 CAPSULE BY MOUTH EVERY DAY 90 capsule 3  . fluticasone (FLONASE) 50 MCG/ACT nasal spray Place 1  spray into both nostrils daily.    Marland Kitchen loratadine (CLARITIN) 10 MG tablet Take 10 mg by mouth daily.    Marland Kitchen losartan (COZAAR) 50 MG tablet Take 1 tablet (50 mg total) by mouth daily. 90 tablet 1  . magnesium oxide (MAG-OX) 400 (241.3 Mg) MG tablet Take 250 mg daily by mouth.     . metFORMIN (GLUCOPHAGE) 500 MG tablet Take 500 mg by mouth 2 (two) times daily with a meal.  11  . omeprazole (PRILOSEC) 20 MG capsule TAKE 1 CAPSULE BY MOUTH ONCE DAILY 90 capsule 0  . rivaroxaban (XARELTO) 20 MG TABS tablet TAKE 1 TABLET BY MOUTH ONCE DAILY WITH SUPPER 90 tablet 0  . rosuvastatin (CRESTOR) 10 MG tablet TAKE 1 TABLET(10 MG) BY MOUTH DAILY 90 tablet 0   No current facility-administered medications for this visit.     PHYSICAL EXAMINATION: ECOG PERFORMANCE STATUS: 0 - Asymptomatic  BP 136/83 (BP Location: Left Arm, Patient Position: Sitting)   Pulse 76   Temp 97.8 F (36.6 C) (Tympanic)   Resp 16   Wt 266 lb 10.3 oz (120.9 kg)   BMI 41.15 kg/m   Filed Weights   11/24/17  6644  Weight: 266 lb 10.3 oz (120.9 kg)    Physical Exam  Constitutional: Mason Houston is oriented to person, place, and time and well-developed, well-nourished, and in no distress.  HENT:  Head: Normocephalic and atraumatic.  Mouth/Throat: Oropharynx is clear and moist. No oropharyngeal exudate.  Eyes: Pupils are equal, round, and reactive to light.  Neck: Normal range of motion. Neck supple.  Cardiovascular: Normal rate and regular rhythm.  Pulmonary/Chest: No respiratory distress. Mason Houston has no wheezes.  Abdominal: Soft. Bowel sounds are normal. Mason Houston exhibits no distension and no mass. There is no tenderness. There is no rebound and no guarding.  Musculoskeletal: Normal range of motion. Mason Houston exhibits no edema or tenderness.  Neurological: Mason Houston is alert and oriented to person, place, and time.  Skin: Skin is warm.  Psychiatric: Affect normal.       LABORATORY DATA:  I have reviewed the data as listed    Component Value Date/Time    NA 139 11/24/2017 0923   NA 142 05/15/2015 1156   NA 139 12/19/2011 1044   K 4.7 11/24/2017 0923   K 4.1 12/19/2011 1044   CL 100 11/24/2017 0923   CL 106 12/19/2011 1044   CO2 29 11/24/2017 0923   CO2 26 12/19/2011 1044   GLUCOSE 158 (H) 11/24/2017 0923   GLUCOSE 95 12/19/2011 1044   BUN 23 11/24/2017 0923   BUN 16 05/15/2015 1156   BUN 18 12/19/2011 1044   CREATININE 1.16 11/24/2017 0923   CREATININE 0.88 05/08/2016 1152   CALCIUM 9.2 11/24/2017 0923   CALCIUM 9.0 12/19/2011 1044   PROT 7.8 11/24/2017 0923   PROT 6.1 05/15/2015 1156   PROT 9.0 (H) 12/19/2011 1044   ALBUMIN 4.3 11/24/2017 0923   ALBUMIN 3.7 05/15/2015 1156   ALBUMIN 4.4 12/19/2011 1044   AST 29 11/24/2017 0923   AST 44 (H) 12/19/2011 1044   ALT 25 11/24/2017 0923   ALT 46 12/19/2011 1044   ALKPHOS 77 11/24/2017 0923   ALKPHOS 110 12/19/2011 1044   BILITOT 0.9 11/24/2017 0923   BILITOT 0.2 05/15/2015 1156   BILITOT 0.7 12/19/2011 1044   GFRNONAA >60 11/24/2017 0923   GFRNONAA >89 05/08/2016 1152   GFRAA >60 11/24/2017 0923   GFRAA >89 05/08/2016 1152    No results found for: SPEP, UPEP  Lab Results  Component Value Date   WBC 6.0 11/24/2017   NEUTROABS 3.4 11/24/2017   HGB 14.2 11/24/2017   HCT 44.3 11/24/2017   MCV 83.1 11/24/2017   PLT 352 11/24/2017      Chemistry      Component Value Date/Time   NA 139 11/24/2017 0923   NA 142 05/15/2015 1156   NA 139 12/19/2011 1044   K 4.7 11/24/2017 0923   K 4.1 12/19/2011 1044   CL 100 11/24/2017 0923   CL 106 12/19/2011 1044   CO2 29 11/24/2017 0923   CO2 26 12/19/2011 1044   BUN 23 11/24/2017 0923   BUN 16 05/15/2015 1156   BUN 18 12/19/2011 1044   CREATININE 1.16 11/24/2017 0923   CREATININE 0.88 05/08/2016 1152      Component Value Date/Time   CALCIUM 9.2 11/24/2017 0923   CALCIUM 9.0 12/19/2011 1044   ALKPHOS 77 11/24/2017 0923   ALKPHOS 110 12/19/2011 1044   AST 29 11/24/2017 0923   AST 44 (H) 12/19/2011 1044   ALT 25  11/24/2017 0923   ALT 46 12/19/2011 1044   BILITOT 0.9 11/24/2017 0923   BILITOT 0.2  05/15/2015 1156   BILITOT 0.7 12/19/2011 1044       RADIOGRAPHIC STUDIES: I have personally reviewed the radiological images as listed and agreed with the findings in the report. No results found.   ASSESSMENT & PLAN:  Diffuse large b-cell lymphoma, intra-abdominal lymph nodes (HCC) # DLBCL of pancreatic head-  Stage IE. S/p R-CHOP [finished 2017 April]. STABLE.  # Currently no evidence of disease; monitor clinically.  Imaging only if concern for recurrence.  # Elevated Blood Pressure-improved; Continue On anti-HTN  # Paroxysmal A.fib- on xarelto.  Stable.  #Colonoscopy Sep 2019- adenoma in caecum; recto-sigmoid adenoma with high grade dysplasia s/p resection; awaiting repeat colonoscopy in sep 2020 [Dr.Toledo].  Reviewed the recommendations and pathology with the patient detail.  # DISPOSITION: # Follow up in 6 months/-MD-labs-cbc/cmp/dh-Dr.B.   # 25 minutes face-to-face with the patient discussing the above plan of care; more than 50% of time spent on prognosis/ natural history; counseling and coordination.   Orders Placed This Encounter  Procedures  . CBC with Differential/Platelet    Standing Status:   Future    Standing Expiration Date:   11/25/2018  . Comprehensive metabolic panel    Standing Status:   Future    Standing Expiration Date:   11/25/2018  . Lactate dehydrogenase    Standing Status:   Future    Standing Expiration Date:   11/25/2018   All questions were answered. The patient knows to call the clinic with any problems, questions or concerns.      Cammie Sickle, MD 11/24/2017 12:26 PM

## 2017-11-24 NOTE — Assessment & Plan Note (Addendum)
#   DLBCL of pancreatic head-  Stage IE. S/p R-CHOP [finished 2017 April]. STABLE.  # Currently no evidence of disease; monitor clinically.  Imaging only if concern for recurrence.  # Elevated Blood Pressure-improved; Continue On anti-HTN  # Paroxysmal A.fib- on xarelto.  Stable.  #Colonoscopy Sep 2019- adenoma in caecum; recto-sigmoid adenoma with high grade dysplasia s/p resection; awaiting repeat colonoscopy in sep 2020 [Dr.Toledo].  Reviewed the recommendations and pathology with the patient detail.  # DISPOSITION: # Follow up in 6 months/-MD-labs-cbc/cmp/dh-Dr.B.   # 25 minutes face-to-face with the patient discussing the above plan of care; more than 50% of time spent on prognosis/ natural history; counseling and coordination.

## 2017-12-13 ENCOUNTER — Other Ambulatory Visit: Payer: Self-pay | Admitting: Internal Medicine

## 2017-12-15 DIAGNOSIS — G4733 Obstructive sleep apnea (adult) (pediatric): Secondary | ICD-10-CM | POA: Diagnosis not present

## 2017-12-15 DIAGNOSIS — G4730 Sleep apnea, unspecified: Secondary | ICD-10-CM | POA: Diagnosis not present

## 2018-01-08 ENCOUNTER — Other Ambulatory Visit: Payer: Self-pay

## 2018-01-08 MED ORDER — DILTIAZEM HCL ER BEADS 360 MG PO CP24: ORAL_CAPSULE | ORAL | 3 refills | Status: DC

## 2018-01-08 NOTE — Telephone Encounter (Signed)
*  STAT* If patient is at the pharmacy, call can be transferred to refill team.   1. Which medications need to be refilled? (please list name of each medication and dose if known)  Diltiazem  2. Which pharmacy/location (including street and city if local pharmacy) is medication to be sent to? [place]  3. Do they need a 30 day or 90 day supply? [place]

## 2018-01-27 ENCOUNTER — Ambulatory Visit (INDEPENDENT_AMBULATORY_CARE_PROVIDER_SITE_OTHER): Payer: Medicare HMO

## 2018-01-27 DIAGNOSIS — G4733 Obstructive sleep apnea (adult) (pediatric): Secondary | ICD-10-CM

## 2018-01-27 NOTE — Progress Notes (Signed)
95 percentile pressure 8   95th percentile leak 16.6   apnea index 3.2 /hr  apnea-hypopnea index  3.9 /hr   total days used  >4 hr 85 days  total days used <4 hr 3 days  Total compliance 94 percent  [name] is doing good. He had a stressful month from end of nov to end of Dec, but seems to be much better. All reading have improved.

## 2018-01-28 ENCOUNTER — Encounter: Payer: Self-pay | Admitting: Internal Medicine

## 2018-01-28 ENCOUNTER — Ambulatory Visit: Payer: Medicare HMO | Admitting: Internal Medicine

## 2018-01-28 VITALS — BP 138/84 | HR 95 | Resp 16 | Ht 67.0 in | Wt 269.0 lb

## 2018-01-28 DIAGNOSIS — I4891 Unspecified atrial fibrillation: Secondary | ICD-10-CM | POA: Diagnosis not present

## 2018-01-28 DIAGNOSIS — G4733 Obstructive sleep apnea (adult) (pediatric): Secondary | ICD-10-CM | POA: Diagnosis not present

## 2018-01-28 DIAGNOSIS — K219 Gastro-esophageal reflux disease without esophagitis: Secondary | ICD-10-CM

## 2018-01-28 DIAGNOSIS — Z9989 Dependence on other enabling machines and devices: Secondary | ICD-10-CM

## 2018-01-28 DIAGNOSIS — C8590 Non-Hodgkin lymphoma, unspecified, unspecified site: Secondary | ICD-10-CM

## 2018-01-28 NOTE — Patient Instructions (Signed)
CPAP and BPAP Information  CPAP and BPAP are methods of helping a person breathe with the use of air pressure. CPAP stands for "continuous positive airway pressure." BPAP stands for "bi-level positive airway pressure." In both methods, air is blown through your nose or mouth and into your air passages to help you breathe well.  CPAP and BPAP use different amounts of pressure to blow air. With CPAP, the amount of pressure stays the same while you breathe in and out. With BPAP, the amount of pressure is increased when you breathe in (inhale) so that you can take larger breaths. Your health care provider will recommend whether CPAP or BPAP would be more helpful for you.  Why are CPAP and BPAP treatments used?  CPAP or BPAP can be helpful if you have:  · Sleep apnea.  · Chronic obstructive pulmonary disease (COPD).  · Heart failure.  · Medical conditions that weaken the muscles of the chest including muscular dystrophy, or neurological diseases such as amyotrophic lateral sclerosis (ALS).  · Other problems that cause breathing to be weak, abnormal, or difficult.  CPAP is most commonly used for obstructive sleep apnea (OSA) to keep the airways from collapsing when the muscles relax during sleep.  How is CPAP or BPAP administered?  Both CPAP and BPAP are provided by a small machine with a flexible plastic tube that attaches to a plastic mask. You wear the mask. Air is blown through the mask into your nose or mouth. The amount of pressure that is used to blow the air can be adjusted on the machine. Your health care provider will determine the pressure setting that should be used based on your individual needs.  When should CPAP or BPAP be used?  In most cases, the mask only needs to be worn during sleep. Generally, the mask needs to be worn throughout the night and during any daytime naps. People with certain medical conditions may also need to wear the mask at other times when they are awake. Follow instructions from your  health care provider about when to use the machine.  What are some tips for using the mask?    · Because the mask needs to be snug, some people feel trapped or closed-in (claustrophobic) when first using the mask. If you feel this way, you may need to get used to the mask. One way to do this is by holding the mask loosely over your nose or mouth and then gradually applying the mask more snugly. You can also gradually increase the amount of time that you use the mask.  · Masks are available in various types and sizes. Some fit over your mouth and nose while others fit over just your nose. If your mask does not fit well, talk with your health care provider about getting a different one.  · If you are using a mask that fits over your nose and you tend to breathe through your mouth, a chin strap may be applied to help keep your mouth closed.  · The CPAP and BPAP machines have alarms that may sound if the mask comes off or develops a leak.  · If you have trouble with the mask, it is very important that you talk with your health care provider about finding a way to make the mask easier to tolerate. Do not stop using the mask. Stopping the use of the mask could have a negative impact on your health.  What are some tips for using the machine?  ·   Place your CPAP or BPAP machine on a secure table or stand near an electrical outlet.  · Know where the on/off switch is located on the machine.  · Follow instructions from your health care provider about how to set the pressure on your machine and when you should use it.  · Do not eat or drink while the CPAP or BPAP machine is on. Food or fluids could get pushed into your lungs by the pressure of the CPAP or BPAP.  · Do not smoke. Tobacco smoke residue can damage the machine.  · For home use, CPAP and BPAP machines can be rented or purchased through home health care companies. Many different brands of machines are available. Renting a machine before purchasing may help you find out  which particular machine works well for you.  · Keep the CPAP or BPAP machine and attachments clean. Ask your health care provider for specific instructions.  Get help right away if:  · You have redness or open areas around your nose or mouth where the mask fits.  · You have trouble using the CPAP or BPAP machine.  · You cannot tolerate wearing the CPAP or BPAP mask.  · You have pain, discomfort, and bloating in your abdomen.  Summary  · CPAP and BPAP are methods of helping a person breathe with the use of air pressure.  · Both CPAP and BPAP are provided by a small machine with a flexible plastic tube that attaches to a plastic mask.  · If you have trouble with the mask, it is very important that you talk with your health care provider about finding a way to make the mask easier to tolerate.  This information is not intended to replace advice given to you by your health care provider. Make sure you discuss any questions you have with your health care provider.  Document Released: 09/21/2003 Document Revised: 08/25/2017 Document Reviewed: 11/12/2015  Elsevier Interactive Patient Education © 2019 [employer] Inc[employer]

## 2018-01-28 NOTE — Progress Notes (Signed)
[facility] [place], [place] [address]  Pulmonary Sleep Medicine   Office Visit Note  Patient Name: Mason Houston DOB: 04/04/1950 MRN [id number]  Date of Service: 01/28/2018  Complaints/HPI: OSA on CPAP he is been doing very well with the CPAP.  Patient denies having any other specific complaints related to the CPAP.  Denies any sinus troubles denies inability to use the machine  ROS  General: (-) fever, (-) chills, (-) night sweats, (-) weakness Skin: (-) rashes, (-) itching,. Eyes: (-) visual changes, (-) redness, (-) itching. Nose and Sinuses: (-) nasal stuffiness or itchiness, (-) postnasal drip, (-) nosebleeds, (-) sinus trouble. Mouth and Throat: (-) sore throat, (-) hoarseness. Neck: (-) swollen glands, (-) enlarged thyroid, (-) neck pain. Respiratory: - cough, (-) bloody sputum, + shortness of breath, - wheezing. Cardiovascular: - ankle swelling, (-) chest pain. Lymphatic: (-) lymph node enlargement. Neurologic: (-) numbness, (-) tingling. Psychiatric: (-) anxiety, (-) depression   Current Medication: Outpatient Encounter Medications as of 01/28/2018  Medication Sig  . acetaminophen (TYLENOL) 650 MG CR tablet Take 1,300 mg by mouth daily.  . Ascorbic Acid (VITAMIN C) 1000 MG tablet Take 1,000 mg by mouth daily.  Marland Kitchen b complex vitamins capsule Take 1 capsule by mouth daily.   . cholecalciferol (VITAMIN D) 1000 units tablet Take 1,000 Units by mouth daily.  . ciclopirox (PENLAC) 8 % solution Apply topically at bedtime. Apply over nail and surrounding skin. Apply daily over previous coat. After seven (7) days, may remove with alcohol and continue cycle.  . diltiazem (TIAZAC) 360 MG 24 hr capsule TAKE 1 CAPSULE BY MOUTH EVERY DAY  . fluticasone (FLONASE) 50 MCG/ACT nasal spray Place 1 spray into both nostrils daily.  Marland Kitchen loratadine (CLARITIN) 10 MG tablet Take 10 mg by mouth daily.  Marland Kitchen losartan (COZAAR) 50 MG tablet Take 1 tablet (50 mg total) by  mouth daily.  . magnesium oxide (MAG-OX) 400 (241.3 Mg) MG tablet Take 250 mg daily by mouth.   . metFORMIN (GLUCOPHAGE) 500 MG tablet Take 500 mg by mouth 2 (two) times daily with a meal.  . omeprazole (PRILOSEC) 20 MG capsule TAKE 1 CAPSULE BY MOUTH ONCE DAILY  . rivaroxaban (XARELTO) 20 MG TABS tablet TAKE 1 TABLET BY MOUTH ONCE DAILY WITH SUPPER  . rosuvastatin (CRESTOR) 10 MG tablet TAKE 1 TABLET(10 MG) BY MOUTH DAILY   No facility-administered encounter medications on file as of 01/28/2018.     Surgical History: Past Surgical History:  Procedure Laterality Date  . COLONOSCOPY  2012   Dr Candace Cruise  . COLONOSCOPY WITH PROPOFOL N/A 09/09/2017   Procedure: COLONOSCOPY WITH PROPOFOL;  Surgeon: Toledo, Benay Pike, MD;  Location: [facility] ENDOSCOPY;  Service: Gastroenterology;  Laterality: N/A;  . CYST EXCISION    . ERCP N/A 12/29/2014   Procedure: ENDOSCOPIC RETROGRADE CHOLANGIOPANCREATOGRAPHY (ERCP);  Surgeon: Hulen Luster, MD;  Location: [facility] ENDOSCOPY;  Service: Endoscopy;  Laterality: N/A;  . ERCP N/A 02/21/2015   Procedure: ENDOSCOPIC RETROGRADE CHOLANGIOPANCREATOGRAPHY (ERCP);  Surgeon: Hulen Luster, MD;  Location: [facility] ENDOSCOPY;  Service: Gastroenterology;  Laterality: N/A;  . ERCP N/A 08/14/2015   Procedure: ENDOSCOPIC RETROGRADE CHOLANGIOPANCREATOGRAPHY (ERCP) Stent removal;  Surgeon: [name], MD;  Location: [facility] ENDOSCOPY;  Service: Endoscopy;  Laterality: N/A;  . PERIPHERAL VASCULAR CATHETERIZATION N/A 01/31/2015   Procedure: Glori Luis Cath Insertion;  Surgeon: Katha Cabal, MD;  Location: [place] CV LAB;  Service: Cardiovascular;  Laterality: N/A;  . PERIPHERAL VASCULAR CATHETERIZATION N/A 12/18/2015  Procedure: Porta Cath Removal;  Surgeon: Katha Cabal, MD;  Location: [place] CV LAB;  Service: Cardiovascular;  Laterality: N/A;  . TRIGGER FINGER RELEASE      Medical History: Past Medical History:  Diagnosis Date  . Atrial fibrillation ([place])   . Cancer ([place])   .  Collagen vascular disease ([place])   . Depression   . Diabetes mellitus without complication ([place])   . Dysrhythmia   . Fibromyalgia   . GERD (gastroesophageal reflux disease)   . H/O Bell's palsy   . History of [place] measles   . Hypertension   . Mini stroke ([place])   . Non Hodgkin's lymphoma ([place]) 2017  . Osteoarthrosis, unspecified whether generalized or localized, lower leg   . Sixth cranial nerve palsy   . Sleep apnea   . Stroke [facility])     Family History: Family History  Problem Relation Age of Onset  . Hypertension Mother   . Heart attack Father   . Stroke Father     Social History: Social History   Socioeconomic History  . Marital status: Married    Spouse name: Not on file  . Number of children: Not on file  . Years of education: Not on file  . Highest education level: Not on file  Occupational History  . Not on file  Social Needs  . Financial resource strain: Not on file  . Food insecurity:    Worry: Not on file    Inability: Not on file  . Transportation needs:    Medical: Not on file    Non-medical: Not on file  Tobacco Use  . Smoking status: Former Smoker    Packs/day: 4.50    Years: 15.00    Pack years: 67.50    Types: Cigarettes  . Smokeless tobacco: Never Used  Substance and Sexual Activity  . Alcohol use: No  . Drug use: No    Types: Marijuana    Comment: PAST  . Sexual activity: Not on file  Lifestyle  . Physical activity:    Days per week: Not on file    Minutes per session: Not on file  . Stress: Not on file  Relationships  . Social connections:    Talks on phone: Not on file    Gets together: Not on file    Attends religious service: Not on file    Active member of club or organization: Not on file    Attends meetings of clubs or organizations: Not on file    Relationship status: Not on file  . Intimate partner violence:    Fear of current or ex partner: Not on file    Emotionally abused: Not on file    Physically abused: Not on  file    Forced sexual activity: Not on file  Other Topics Concern  . Not on file  Social History Narrative  . Not on file    Vital Signs: Blood pressure 138/84, pulse 95, resp. rate 16, height _0  (1.702 m), weight 269 lb (122 kg), SpO2 96 %.  Examination: General Appearance: The patient is well-developed, well-nourished, and in no distress. Skin: Gross inspection of skin unremarkable. Head: normocephalic, no gross deformities. Eyes: no gross deformities noted. ENT: ears appear grossly normal no exudates. Neck: Supple. No thyromegaly. No LAD. Respiratory: no rhonchi noted. Cardiovascular: Normal S1 and S2 without murmur or rub. Extremities: No cyanosis. pulses are equal. Neurologic: Alert and oriented. No involuntary movements.  LABS: Recent Results (from the past  2160 hour(s))  Lactate dehydrogenase     Status: None   Collection Time: 11/24/17  9:23 AM  Result Value Ref Range   LDH 127 98 - 192 U/L    Comment: Performed at [facility], [address] [place], [place] [address]  Comprehensive metabolic panel     Status: Abnormal   Collection Time: 11/24/17  9:23 AM  Result Value Ref Range   Sodium 139 135 - 145 mmol/L   Potassium 4.7 3.5 - 5.1 mmol/L   Chloride 100 98 - 111 mmol/L   CO2 29 22 - 32 mmol/L   Glucose, Bld 158 (H) 70 - 99 mg/dL   BUN 23 8 - 23 mg/dL   Creatinine, Ser 1.16 0.61 - 1.24 mg/dL   Calcium 9.2 8.9 - 10.3 mg/dL   Total Protein 7.8 6.5 - 8.1 g/dL   Albumin 4.3 3.5 - 5.0 g/dL   AST 29 15 - 41 U/L   ALT 25 0 - 44 U/L   Alkaline Phosphatase 77 38 - 126 U/L   Total Bilirubin 0.9 0.3 - 1.2 mg/dL   GFR calc non Af Amer >60 >60 mL/min   GFR calc Af Amer >60 >60 mL/min    Comment: (NOTE) The eGFR has been calculated using the CKD EPI equation. This calculation has not been validated in all clinical situations. eGFR's persistently <60 mL/min signify possible Chronic Kidney Disease.    Anion gap 10 5 - 15    Comment: Performed at  [facility], [address] [place], [place] [address]  CBC with Differential/Platelet     Status: None   Collection Time: 11/24/17  9:23 AM  Result Value Ref Range   WBC 6.0 4.0 - 10.5 K/uL   RBC 5.33 4.22 - 5.81 MIL/uL   Hemoglobin 14.2 13.0 - 17.0 g/dL   HCT 44.3 39.0 - 52.0 %   MCV 83.1 80.0 - 100.0 fL   MCH 26.6 26.0 - 34.0 pg   MCHC 32.1 30.0 - 36.0 g/dL   RDW 14.6 11.5 - 15.5 %   Platelets 352 150 - 400 K/uL   nRBC 0.0 0.0 - 0.2 %   Neutrophils Relative % 56 %   Neutro Abs 3.4 1.7 - 7.7 K/uL   Lymphocytes Relative 28 %   Lymphs Abs 1.6 0.7 - 4.0 K/uL   Monocytes Relative 11 %   Monocytes Absolute 0.6 0.1 - 1.0 K/uL   Eosinophils Relative 4 %   Eosinophils Absolute 0.3 0.0 - 0.5 K/uL   Basophils Relative 1 %   Basophils Absolute 0.1 0.0 - 0.1 K/uL   Immature Granulocytes 0 %   Abs Immature Granulocytes 0.01 0.00 - 0.07 K/uL    Comment: Performed at [facility], [address] [place], [place] [address]    Radiology: No results found.  No results found.  No results found.    Assessment and Plan: Patient Active Problem List   Diagnosis Date Noted  . Abdominal abscess 12/13/2015  . 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 Localized skin mass, lump, or swelling 11/09/2015  . Internal nasal lesion 10/30/2015  . Fitting and adjustment of gastrointestinal appliance and device   . Disease of biliary tract   . History of stroke 07/25/2015  . Annual physical exam 07/03/2015  . Pneumonia 05/15/2015  . Fever 05/15/2015  . Hypoxia 05/07/2015  . Pancreatic mass 01/12/2015  . Obstructive jaundice 12/28/2014  . Arthritis 06/09/2014  .  Benign essential HTN 10/04/2013  . 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 Acid reflux 10/04/2013  . Arthritis, degenerative 10/04/2013  . HLD (hyperlipidemia) 10/04/2013  . H/O disease 10/04/2013  . Apnea, sleep 10/04/2013  . Type 2 diabetes mellitus ([place])  10/04/2013  . Stroke ([place]) 09/08/2013  . Obstructive sleep apnea 02/03/2013  . Depression 07/05/2012  . Major depressive disorder with single episode 07/05/2012  . Hyperlipidemia LDL goal <100 12/23/2011  . Atrial fibrillation ([place])   . Hypertension     1. OSA on CPAP continue with CPAP on the present settings.  Patient has good tolerance good compliance 2. Morbid obesity patient does need to work on weight loss.  He is working on it however has not been very successful 3. NHL followed by oncology we will continue with supportive care 4. A fib currently is rate controlled we will continue present management 5. GERD controlled at this time continue present therapy  General Counseling: I have discussed the findings of the evaluation and examination with Mason Houston.  I have also discussed any further diagnostic evaluation thatmay be needed or ordered today. Mason Houston of the findings of todays visit. We also reviewed his medications today and discussed drug interactions and side effects including but not limited excessive drowsiness and altered mental states. We also discussed that there is always a risk not just to him but also people around him. he has been encouraged to call the office with any questions or concerns that should arise related to todays visit.    Time spent: 15 minutes  I have personally obtained a history, examined the patient, evaluated laboratory and imaging results, formulated the assessment and plan and placed orders.    Allyne Gee, MD [facility] Pulmonary and Critical Care Sleep medicine

## 2018-03-18 ENCOUNTER — Other Ambulatory Visit: Payer: Self-pay | Admitting: *Deleted

## 2018-03-18 MED ORDER — OMEPRAZOLE 20 MG PO CPDR: 20.0000 mg | DELAYED_RELEASE_CAPSULE | Freq: Every day | ORAL | 1 refills | Status: DC

## 2018-05-10 ENCOUNTER — Other Ambulatory Visit: Payer: Self-pay | Admitting: Cardiovascular Disease

## 2018-05-12 ENCOUNTER — Telehealth: Payer: Self-pay | Admitting: Cardiovascular Disease

## 2018-05-14 ENCOUNTER — Other Ambulatory Visit: Payer: Self-pay

## 2018-05-14 ENCOUNTER — Telehealth (INDEPENDENT_AMBULATORY_CARE_PROVIDER_SITE_OTHER): Payer: Medicare HMO | Admitting: Cardiovascular Disease

## 2018-05-14 ENCOUNTER — Encounter: Payer: Self-pay | Admitting: Cardiovascular Disease

## 2018-05-14 VITALS — BP 150/80 | HR 85 | Ht 66.0 in | Wt 276.0 lb

## 2018-05-14 DIAGNOSIS — I4819 Other persistent atrial fibrillation: Secondary | ICD-10-CM

## 2018-05-14 NOTE — Progress Notes (Signed)
Virtual Visit via Telephone Note   This visit type was conducted due to national recommendations for restrictions regarding the COVID-19 Pandemic (e.g. social distancing) in an effort to limit this patient's exposure and mitigate transmission in our community.  Due to his co-morbid illnesses, this patient is at least at moderate risk for complications without adequate follow up.  This format is felt to be most appropriate for this patient at this time.  The patient did not have access to video technology/had technical difficulties with video requiring transitioning to audio format only (telephone).  All issues noted in this document were discussed and addressed.  No physical exam could be performed with this format.  Please refer to the patient's chart for his  consent to telehealth for [facility].   Date:  05/14/2018   ID:  Mason Houston, DOB [birth year], MRN [id number]  Patient Location: Home Provider Location: Office  PCP:  Baxter Hire, MD  Cardiologist:  Kathlyn Sacramento, MD  Electrophysiologist:  None   Evaluation Performed:  Follow-Up Visit  Chief Complaint: Weight gain  History of Present Illness:    Mason Houston is a 68 y.o. male was reached via phone for follow-up visit regarding persistent atrial fibrillation.   The patient has a long history of  hypertension and obesity. He was diagnosed with atrial fibrillation with rapid ventricular response during a routine physical  in December of 2013 in the setting of heavy caffeine intake. He also has sleep apnea currently effectively treated with CPAP.  He had a stroke in August 2015.   He was diagnosed with non-Hodgkin's lymphoma in 2017 and was treated successfully with chemotherapy. Echocardiogram In July 2017 showed normal LV systolic function.   He is being treated with rate control for atrial fibrillation given lack of symptoms.  He has been doing well with no chest pain, shortness of breath or palpitations.  He  moved to a new house in [place].  He has been mostly staying home and due to that he gained some weight.  The patient does not have symptoms concerning for COVID-19 infection (fever, chills, cough, or new shortness of breath).    Past Medical History:  Diagnosis Date  . Atrial fibrillation ([place])   . Cancer ([place])   . Collagen vascular disease ([place])   . Depression   . Diabetes mellitus without complication ([place])   . Dysrhythmia   . Fibromyalgia   . GERD (gastroesophageal reflux disease)   . H/O Bell's palsy   . History of [place] measles   . Hypertension   . Mini stroke ([place])   . Non Hodgkin's lymphoma ([place]) 2017  . Osteoarthrosis, unspecified whether generalized or localized, lower leg   . Sixth cranial nerve palsy   . Sleep apnea   . Stroke [facility])    Past Surgical History:  Procedure Laterality Date  . COLONOSCOPY  2012   Dr Candace Cruise  . COLONOSCOPY WITH PROPOFOL N/A 09/09/2017   Procedure: COLONOSCOPY WITH PROPOFOL;  Surgeon: Toledo, Benay Pike, MD;  Location: [facility] ENDOSCOPY;  Service: Gastroenterology;  Laterality: N/A;  . CYST EXCISION    . ERCP N/A 12/29/2014   Procedure: ENDOSCOPIC RETROGRADE CHOLANGIOPANCREATOGRAPHY (ERCP);  Surgeon: Hulen Luster, MD;  Location: [facility] ENDOSCOPY;  Service: Endoscopy;  Laterality: N/A;  . ERCP N/A 02/21/2015   Procedure: ENDOSCOPIC RETROGRADE CHOLANGIOPANCREATOGRAPHY (ERCP);  Surgeon: Hulen Luster, MD;  Location: [facility] ENDOSCOPY;  Service: Gastroenterology;  Laterality: N/A;  . ERCP N/A 08/14/2015   Procedure: ENDOSCOPIC RETROGRADE CHOLANGIOPANCREATOGRAPHY (ERCP)  Stent removal;  Surgeon: [name], MD;  Location: [facility] ENDOSCOPY;  Service: Endoscopy;  Laterality: N/A;  . PERIPHERAL VASCULAR CATHETERIZATION N/A 01/31/2015   Procedure: Glori Luis Cath Insertion;  Surgeon: Katha Cabal, MD;  Location: [place] [facility];  Service: Cardiovascular;  Laterality: N/A;  . PERIPHERAL VASCULAR CATHETERIZATION N/A 12/18/2015   Procedure: Glori Luis Cath Removal;  Surgeon:  Katha Cabal, MD;  Location: [place] [facility] LAB;  Service: Cardiovascular;  Laterality: N/A;  . TRIGGER FINGER RELEASE       Current Meds  Medication Sig  . acetaminophen (TYLENOL) 650 MG CR tablet Take 1,300 mg by mouth daily.  . Ascorbic Acid (VITAMIN C) 1000 MG tablet Take 1,000 mg by mouth daily.  Marland Kitchen b complex vitamins capsule Take 1 capsule by mouth daily.   . cholecalciferol (VITAMIN D) 1000 units tablet Take 1,000 Units by mouth daily.  Marland Kitchen diltiazem (TIAZAC) 360 MG 24 hr capsule TAKE 1 CAPSULE BY MOUTH EVERY DAY  . loratadine (CLARITIN) 10 MG tablet Take 10 mg by mouth daily.  Marland Kitchen losartan (COZAAR) 50 MG tablet Take 1 tablet by mouth once daily  . magnesium oxide (MAG-OX) 400 (241.3 Mg) MG tablet Take 250 mg daily by mouth.   . metFORMIN (GLUCOPHAGE) 500 MG tablet Take 500 mg by mouth 2 (two) times daily with a meal.  . omeprazole (PRILOSEC) 20 MG capsule Take 1 capsule (20 mg total) by mouth daily.  . rivaroxaban (XARELTO) 20 MG TABS tablet TAKE 1 TABLET BY MOUTH ONCE DAILY WITH SUPPER  . rosuvastatin (CRESTOR) 10 MG tablet TAKE 1 TABLET(10 MG) BY MOUTH DAILY     Allergies:   Celebrex [celecoxib]; Other; and Penicillins   Social History   Tobacco Use  . Smoking status: Former Smoker    Packs/day: 4.50    Years: 15.00    Pack years: 67.50    Types: Cigarettes  . Smokeless tobacco: Never Used  Substance Use Topics  . Alcohol use: No  . Drug use: No    Types: Marijuana    Comment: PAST     Family Hx: The patient's family history includes Heart attack in his father; Hypertension in his mother; Stroke in his father.  ROS:   Please see the history of present illness.     All other systems reviewed and are negative.   Prior CV studies:   The following studies were reviewed today:    Labs/Other Tests and Data Reviewed:    EKG:  No ECG reviewed.  Recent Labs: 11/24/2017: ALT 25; BUN 23; Creatinine, Ser 1.16; Hemoglobin 14.2; Platelets 352; Potassium 4.7;  Sodium 139   Recent Lipid Panel Lab Results  Component Value Date/Time   CHOL 139 05/08/2016 11:52 AM   CHOL 181 07/03/2015 10:07 AM   TRIG 69 05/08/2016 11:52 AM   HDL 42 05/08/2016 11:52 AM   HDL 44 07/03/2015 10:07 AM   CHOLHDL 3.3 05/08/2016 11:52 AM   LDLCALC 83 05/08/2016 11:52 AM   LDLCALC 122 (H) 07/03/2015 10:07 AM    Wt Readings from Last 3 Encounters:  05/14/18 276 lb (125.2 kg)  01/28/18 269 lb (122 kg)  11/24/17 266 lb 10.3 oz (120.9 kg)     Objective:    Vital Signs:  BP (!) 150/80   Pulse 85   Ht 5\' 6"  (1.676 m)   Wt 276 lb (125.2 kg)   SpO2 97%   BMI 44.55 kg/m    VITAL SIGNS:  reviewed  ASSESSMENT & PLAN:  1.  Persistent atrial fibrillation:  Ventricular rate is controlled with diltiazem.  He is tolerating anticoagulation with Xarelto with no side effects.  I reviewed his recent labs done with his primary care physician which were unremarkable.   2. Essential hypertension: Blood pressure is elevated.  I suspect this is likely due to decreased physical activity and weight gain.  If this continues, I recommend increasing losartan to 100 mg daily.  3.  Hyperlipidemia: Continue treatment with rosuvastatin.  Consider a target LDL of less than 70 given that she is diabetic.  4. Sleep apnea: on CPAP  5. Morbid obesity: I discussed with him the importance of healthy lifestyle changes and resuming exercise.   Disposition:   FU with me in 6 months  Signed,  COVID-19 Education: The signs and symptoms of COVID-19 were discussed with the patient and how to seek care for testing (follow up with PCP or arrange E-visit).  The importance of social distancing was discussed today.  Time:   Today, I have spent 22 minutes with the patient with telehealth technology discussing the above problems.     Medication Adjustments/Labs and Tests Ordered: Current medicines are reviewed at length with the patient today.  Concerns regarding medicines are outlined  above.   Tests Ordered: No orders of the defined types were placed in this encounter.   Medication Changes: No orders of the defined types were placed in this encounter.   Disposition:  Follow up in 6 month(s)  Signed, Kathlyn Sacramento, MD  05/14/2018 3:05 PM    [facility]

## 2018-05-14 NOTE — Patient Instructions (Signed)
Medication Instructions:  Continue same medications If you need a refill on your cardiac medications before your next appointment, please call your pharmacy.   Lab work: None If you have labs (blood work) drawn today and your tests are completely normal, you will receive your results only by: . MyChart Message (if you have MyChart) OR . A paper copy in the mail If you have any lab test that is abnormal or we need to change your treatment, we will call you to review the results.  Testing/Procedures: None  Follow-Up: At [facility], you and your health needs are our priority.  As part of our continuing mission to provide you with exceptional heart care, we have created designated Provider Care Teams.  These Care Teams include your primary Cardiologist (physician) and Advanced Practice Providers (APPs -  Physician Assistants and Nurse Practitioners) who all work together to provide you with the care you need, when you need it. You will need a follow up appointment in 6 months.  Please call our office 2 months in advance to schedule this appointment.  You may see Muhammad Arida, MD or one of the following Advanced Practice Providers on your designated Care Team:   Christopher Berge, NP Ryan Dunn, PA-C . Jacquelyn Visser, PA-C   

## 2018-05-24 ENCOUNTER — Other Ambulatory Visit: Payer: Self-pay

## 2018-05-24 ENCOUNTER — Inpatient Hospital Stay: Payer: Medicare HMO | Attending: Internal Medicine

## 2018-05-24 DIAGNOSIS — I480 Paroxysmal atrial fibrillation: Secondary | ICD-10-CM | POA: Insufficient documentation

## 2018-05-24 DIAGNOSIS — R030 Elevated blood-pressure reading, without diagnosis of hypertension: Secondary | ICD-10-CM | POA: Diagnosis not present

## 2018-05-24 DIAGNOSIS — Z9889 Other specified postprocedural states: Secondary | ICD-10-CM | POA: Insufficient documentation

## 2018-05-24 DIAGNOSIS — Z7901 Long term (current) use of anticoagulants: Secondary | ICD-10-CM | POA: Diagnosis not present

## 2018-05-24 DIAGNOSIS — C8333 Diffuse large B-cell lymphoma, intra-abdominal lymph nodes: Secondary | ICD-10-CM | POA: Insufficient documentation

## 2018-05-24 LAB — COMPREHENSIVE METABOLIC PANEL
ALT: 22 U/L (ref 0–44)
AST: 25 U/L (ref 15–41)
Albumin: 4.1 g/dL (ref 3.5–5.0)
Alkaline Phosphatase: 71 U/L (ref 38–126)
Anion gap: 8 (ref 5–15)
BUN: 21 mg/dL (ref 8–23)
CO2: 29 mmol/L (ref 22–32)
Calcium: 8.9 mg/dL (ref 8.9–10.3)
Chloride: 105 mmol/L (ref 98–111)
Creatinine, Ser: 1.09 mg/dL (ref 0.61–1.24)
GFR calc Af Amer: >60 mL/min (ref >60)
GFR calc non Af Amer: >60 mL/min (ref >60)
Glucose, Bld: 144 mg/dL — ABNORMAL HIGH (ref 70–99)
Potassium: 4.7 mmol/L (ref 3.5–5.1)
Sodium: 142 mmol/L (ref 135–145)
Total Bilirubin: 0.7 mg/dL (ref 0.3–1.2)
Total Protein: 7.3 g/dL (ref 6.5–8.1)

## 2018-05-24 LAB — CBC WITH DIFFERENTIAL/PLATELET
Abs Immature Granulocytes: 0.01 10*3/uL (ref 0.00–0.07)
Basophils Absolute: 0.1 10*3/uL (ref 0.0–0.1)
Basophils Relative: 1 %
Eosinophils Absolute: 0.3 10*3/uL (ref 0.0–0.5)
Eosinophils Relative: 5 %
HCT: 41 % (ref 39.0–52.0)
Hemoglobin: 13.2 g/dL (ref 13.0–17.0)
Immature Granulocytes: 0 %
Lymphocytes Relative: 29 %
Lymphs Abs: 1.7 10*3/uL (ref 0.7–4.0)
MCH: 27.8 pg (ref 26.0–34.0)
MCHC: 32.2 g/dL (ref 30.0–36.0)
MCV: 86.5 fL (ref 80.0–100.0)
Monocytes Absolute: 0.8 10*3/uL (ref 0.1–1.0)
Monocytes Relative: 14 %
Neutro Abs: 2.9 10*3/uL (ref 1.7–7.7)
Neutrophils Relative %: 51 %
Platelets: 311 10*3/uL (ref 150–400)
RBC: 4.74 MIL/uL (ref 4.22–5.81)
RDW: 13.9 % (ref 11.5–15.5)
WBC: 5.8 10*3/uL (ref 4.0–10.5)
nRBC: 0 % (ref 0.0–0.2)

## 2018-05-24 LAB — LACTATE DEHYDROGENASE: LDH: 142 U/L (ref 98–192)

## 2018-05-25 ENCOUNTER — Ambulatory Visit: Payer: PPO | Admitting: Internal Medicine

## 2018-05-25 ENCOUNTER — Other Ambulatory Visit: Payer: PPO

## 2018-05-25 ENCOUNTER — Inpatient Hospital Stay: Payer: Medicare HMO

## 2018-05-25 ENCOUNTER — Inpatient Hospital Stay (HOSPITAL_BASED_OUTPATIENT_CLINIC_OR_DEPARTMENT_OTHER): Payer: Medicare HMO | Admitting: Internal Medicine

## 2018-05-25 DIAGNOSIS — I4891 Unspecified atrial fibrillation: Secondary | ICD-10-CM

## 2018-05-25 DIAGNOSIS — R635 Abnormal weight gain: Secondary | ICD-10-CM

## 2018-05-25 DIAGNOSIS — I10 Essential (primary) hypertension: Secondary | ICD-10-CM | POA: Diagnosis not present

## 2018-05-25 DIAGNOSIS — C8333 Diffuse large B-cell lymphoma, intra-abdominal lymph nodes: Secondary | ICD-10-CM

## 2018-05-25 DIAGNOSIS — Z7901 Long term (current) use of anticoagulants: Secondary | ICD-10-CM

## 2018-05-25 NOTE — Assessment & Plan Note (Addendum)
#   DLBCL of pancreatic head-  Stage IE. S/p R-CHOP [finished 2017 April]. Stable.   # Currently no evidence of disease; monitor clinically.   # Elevated Blood Pressure- s/p evaluation with Cardiology. Continue On anti-HTN/defer to cards/PCP.   # Paroxysmal A.fib- on xarelto stable.   # weight gain- recommend exercise/physical activity.   # DISPOSITION: # Follow up in 6 months/-MD-labs-cbc/cmp/dh-Dr.B.

## 2018-05-25 NOTE — Progress Notes (Signed)
I connected with Mason Houston on 05/25/18 at 10:00 AM EDT by telephone visit and verified that I am speaking with the correct person using two identifiers.  I discussed the limitations, risks, security and privacy concerns of performing an evaluation and management service by telemedicine and the availability of in-person appointments. I also discussed with the patient that there may be a patient responsible charge related to this service. The patient expressed understanding and agreed to proceed.    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  male with history of follicular B-cell lymphoma currently under surveillance is here for follow-up.  Patient states that he is gaining weight because of physical and activity.  States his blood pressure is slightly elevated diastolic around 95.  This is being monitored by cardiology/PCP.  Denies abdominal pain.  Denies any nausea  vomiting diarrhea.  Denies any chest pain or shortness of breath or cough.  Observation/objective:  Assessment and plan: Diffuse large b-cell lymphoma, intra-abdominal lymph nodes (H[facility]) # DLBCL of pancreatic head-  Stage IE. S/p R-CHOP [finished 2017 April]. Stable.   # Currently no evidence of disease; monitor clinically.   # Elevated Blood Pressure- s/p evaluation with Cardiology. Continue On anti-HTN/defer to cards/PCP.   # Paroxysmal A.fib- on xarelto stable.   # weight gain- recommend exercise/physical activity.   # DISPOSITION: # Follow up in 6 months/-MD-labs-cbc/cmp/dh-Dr.B.      Follow-up instructions:  I discussed the assessment and treatment plan with the patient.  The patient was provided an opportunity to ask questions and all were answered.  The patient agreed with the plan and demonstrated understanding of instructions.  The patient was advised to call back or seek an in person evaluation if the symptoms worsen or if the condition fails to improve as anticipated.  I provided 12 minutes of non face-to-face telephone visit time during this encounter, and > 50% was spent counseling as documented under my assessment & plan.   Dr. Charlaine Dalton [facility] at [facility] 05/25/2018 12:12 PM

## 2018-07-08 ENCOUNTER — Telehealth: Payer: Self-pay | Admitting: Cardiovascular Disease

## 2018-07-08 NOTE — Telephone Encounter (Signed)
Pt takes Xarelto for afib with CHADS2VASc score of 5 (age, HTN, DM, stroke in 27). Renal function is normal. Recommend only holding Xarelto for 1 day prior to colonoscopy due to history of stroke.

## 2018-07-08 NOTE — Telephone Encounter (Signed)
Pt just seen in May by Dr Fletcher Anon and was doing well. Will rout to pharmacy for Xarelto recommendations.  Kerin Ransom PA-C 07/08/2018 11:49 AM

## 2018-07-08 NOTE — Telephone Encounter (Signed)
Since its been more than 30 days since he was seen I placed a call to him and left a message to call back.  Kerin Ransom PA-C 07/08/2018 2:10 PM

## 2018-07-08 NOTE — Telephone Encounter (Signed)
° °  [facility] Pre-operative Risk Assessment    Request for surgical clearance:  1. What type of surgery is being performed? Colonoscopy   2. When is this surgery scheduled? 09/21/2018  3. What type of clearance is required (medical clearance vs. Pharmacy clearance to hold med vs. Both)? both   4. Are there any medications that need to be held prior to surgery and how long? Xarelto x 3 days    5. Practice name and name of physician performing surgery? [facility]  6. What is your office phone number [phone]    7.   What is your office fax number [phone]  8.   Anesthesia type (None, local, MAC, general) ? monitored   Ace Gins 07/08/2018, 11:22 AM  _________________________________________________________________   (provider comments below)

## 2018-07-15 NOTE — Telephone Encounter (Signed)
Left a message for the patient to call back and speak to the preop APP

## 2018-07-19 ENCOUNTER — Telehealth: Payer: Self-pay | Admitting: Cardiovascular Disease

## 2018-07-19 NOTE — Telephone Encounter (Signed)
Follow up:    Patient calling to speak with some one pre-op. Please call patient.

## 2018-07-19 NOTE — Telephone Encounter (Signed)
   Primary Cardiologist: Kathlyn Sacramento, MD  Chart reviewed as part of pre-operative protocol coverage. Patient was contacted 07/19/2018 in reference to pre-operative risk assessment for pending surgery as outlined below.  Aldwin Micalizzi was last seen on 05/14/18 by Dr. Fletcher Anon.  Since that day, Kaulin Chaves has done well with no chest pain.  Therefore, based on ACC/AHA guidelines, the patient would be at acceptable risk for the planned procedure without further cardiovascular testing.   Pt takes Xarelto for afib with CHADS2VASc score of 5 (age, HTN, DM, stroke in 8). Renal function is normal. Recommend only holding Xarelto for 1 day prior to colonoscopy due to history of stroke  I will route this recommendation to the requesting party via [facility] fax function and remove from pre-op pool.  Please call with questions.  Cecilie Kicks, NP 07/19/2018, 5:12 PM

## 2018-08-04 ENCOUNTER — Ambulatory Visit: Payer: Self-pay

## 2018-08-06 ENCOUNTER — Other Ambulatory Visit: Payer: Self-pay | Admitting: Cardiovascular Disease

## 2018-09-16 NOTE — Telephone Encounter (Signed)
Received STAT request for clearance  Information the same as previous clearance request  Please advise

## 2018-09-17 ENCOUNTER — Other Ambulatory Visit
Admission: RE | Admit: 2018-09-17 | Discharge: 2018-09-17 | Disposition: A | Discharge status: Home / Self Care | Payer: Medicare HMO | Source: Ambulatory Visit | Attending: Internal Medicine | Admitting: Internal Medicine

## 2018-09-17 ENCOUNTER — Other Ambulatory Visit: Payer: Self-pay

## 2018-09-17 DIAGNOSIS — Z20828 Contact with and (suspected) exposure to other viral communicable diseases: Secondary | ICD-10-CM | POA: Diagnosis not present

## 2018-09-17 DIAGNOSIS — Z01812 Encounter for preprocedural laboratory examination: Secondary | ICD-10-CM | POA: Insufficient documentation

## 2018-09-17 NOTE — Telephone Encounter (Signed)
Spoke with receptionist and she stated she did receive the clearance and she has it right in her hand.

## 2018-09-17 NOTE — Telephone Encounter (Addendum)
Called [place] and told them I would refax the clearance letter and that it was in Epic as well. The receptionist voiced understanding.

## 2018-09-18 LAB — SARS CORONAVIRUS 2 (TAT 6-24 HRS): SARS Coronavirus 2: NEGATIVE

## 2018-09-21 ENCOUNTER — Ambulatory Visit
Admission: RE | Admit: 2018-09-21 | Discharge: 2018-09-21 | Disposition: A | Discharge status: Home / Self Care | Payer: Medicare HMO | Attending: Internal Medicine | Admitting: Internal Medicine

## 2018-09-21 ENCOUNTER — Encounter
Admission: RE | Disposition: A | Discharge status: Home / Self Care | Payer: Self-pay | Source: Home / Self Care | Attending: Internal Medicine

## 2018-09-21 ENCOUNTER — Ambulatory Visit: Payer: Medicare HMO | Admitting: Certified Registered"

## 2018-09-21 ENCOUNTER — Other Ambulatory Visit: Payer: Self-pay

## 2018-09-21 DIAGNOSIS — G4730 Sleep apnea, unspecified: Secondary | ICD-10-CM | POA: Insufficient documentation

## 2018-09-21 DIAGNOSIS — E119 Type 2 diabetes mellitus without complications: Secondary | ICD-10-CM | POA: Diagnosis not present

## 2018-09-21 DIAGNOSIS — Z79899 Other long term (current) drug therapy: Secondary | ICD-10-CM | POA: Diagnosis not present

## 2018-09-21 DIAGNOSIS — Z7984 Long term (current) use of oral hypoglycemic drugs: Secondary | ICD-10-CM | POA: Insufficient documentation

## 2018-09-21 DIAGNOSIS — I4891 Unspecified atrial fibrillation: Secondary | ICD-10-CM | POA: Insufficient documentation

## 2018-09-21 DIAGNOSIS — Z8572 Personal history of non-Hodgkin lymphomas: Secondary | ICD-10-CM | POA: Diagnosis not present

## 2018-09-21 DIAGNOSIS — Z09 Encounter for follow-up examination after completed treatment for conditions other than malignant neoplasm: Secondary | ICD-10-CM | POA: Insufficient documentation

## 2018-09-21 DIAGNOSIS — I10 Essential (primary) hypertension: Secondary | ICD-10-CM | POA: Diagnosis not present

## 2018-09-21 DIAGNOSIS — Z8673 Personal history of transient ischemic attack (TIA), and cerebral infarction without residual deficits: Secondary | ICD-10-CM | POA: Insufficient documentation

## 2018-09-21 DIAGNOSIS — K621 Rectal polyp: Secondary | ICD-10-CM | POA: Insufficient documentation

## 2018-09-21 DIAGNOSIS — Z86010 Personal history of colonic polyps: Secondary | ICD-10-CM | POA: Insufficient documentation

## 2018-09-21 DIAGNOSIS — K640 First degree hemorrhoids: Secondary | ICD-10-CM | POA: Diagnosis not present

## 2018-09-21 DIAGNOSIS — Z7901 Long term (current) use of anticoagulants: Secondary | ICD-10-CM | POA: Diagnosis not present

## 2018-09-21 DIAGNOSIS — D122 Benign neoplasm of ascending colon: Secondary | ICD-10-CM | POA: Diagnosis not present

## 2018-09-21 DIAGNOSIS — K219 Gastro-esophageal reflux disease without esophagitis: Secondary | ICD-10-CM | POA: Diagnosis not present

## 2018-09-21 HISTORY — PX: COLONOSCOPY WITH PROPOFOL: SHX5780

## 2018-09-21 LAB — GLUCOSE, CAPILLARY: Glucose-Capillary: 151 mg/dL — ABNORMAL HIGH (ref 70–99)

## 2018-09-21 SURGERY — COLONOSCOPY WITH PROPOFOL
Anesthesia: General

## 2018-09-21 MED ORDER — SODIUM CHLORIDE 0.9 % IV SOLN
INTRAVENOUS | Status: DC
  Administered 2018-09-21: 08:00:00 via INTRAVENOUS

## 2018-09-21 MED ORDER — PROPOFOL 500 MG/50ML IV EMUL
INTRAVENOUS | Status: DC | PRN
  Administered 2018-09-21: 140 ug/kg/min via INTRAVENOUS

## 2018-09-21 MED ORDER — PROPOFOL 500 MG/50ML IV EMUL
INTRAVENOUS | Status: AC
  Filled 2018-09-21: qty 50

## 2018-09-21 MED ORDER — PROPOFOL 10 MG/ML IV BOLUS
INTRAVENOUS | Status: DC | PRN
  Administered 2018-09-21: 70 mg via INTRAVENOUS

## 2018-09-21 NOTE — H&P (Signed)
Outpatient short stay form Pre-procedure 09/21/2018 8:17 AM Mason Houston K. Mason Houston, M.D.  Primary Physician: Mason Houston, M.D.  Reason for visit:  Personal hx of tubulovillous adenoma of the cecum.  History of present illness:                           Patient presents for colonoscopy for a personal hx of colon polyps. The patient denies abdominal pain, abnormal weight loss or rectal bleeding.     Current Facility-Administered Medications:  .  0.9 %  sodium chloride infusion, , Intravenous, Continuous, Mason Houston, Mason Pike, MD, Last Rate: 20 mL/hr at 09/21/18 [id number]  Medications Prior to Admission  Medication Sig Dispense Refill Last Dose  . diltiazem (TIAZAC) 360 MG 24 hr capsule TAKE 1 CAPSULE BY MOUTH EVERY DAY 90 capsule 3 09/21/2018 at Unknown time  . losartan (COZAAR) 50 MG tablet Take 1 tablet by mouth once daily 90 tablet 0 09/21/2018 at Unknown time  . metFORMIN (GLUCOPHAGE) 500 MG tablet Take by mouth 2 (two) times daily with a meal.   09/20/2018  . omeprazole (PRILOSEC) 20 MG capsule Take 1 capsule (20 mg total) by mouth daily. 90 capsule 1 09/20/2018 at Unknown time  . acetaminophen (TYLENOL) 650 MG CR tablet Take 1,300 mg by mouth daily.     . Ascorbic Acid (VITAMIN C) 1000 MG tablet Take 1,000 mg by mouth daily.   09/19/2018  . b complex vitamins capsule Take 1 capsule by mouth daily.    09/19/2018  . cholecalciferol (VITAMIN D) 1000 units tablet Take 1,000 Units by mouth daily.   09/19/2018  . ciclopirox (PENLAC) 8 % solution Apply topically at bedtime. Apply over nail and surrounding skin. Apply daily over previous coat. After seven (7) days, may remove with alcohol and continue cycle.     . fluticasone (FLONASE) 50 MCG/ACT nasal spray Place 1 spray into both nostrils daily.   09/19/2018  . loratadine (CLARITIN) 10 MG tablet Take 10 mg by mouth daily.   09/19/2018  . magnesium oxide (MAG-OX) 400 (241.3 Mg) MG tablet Take 250 mg daily by mouth.    09/19/2018  . metFORMIN (GLUCOPHAGE) 500 MG  tablet Take 500 mg by mouth 2 (two) times daily with a meal.  11 Not Taking at Unknown time  . rivaroxaban (XARELTO) 20 MG TABS tablet TAKE 1 TABLET BY MOUTH ONCE DAILY WITH SUPPER 90 tablet 0 09/19/2018  . rosuvastatin (CRESTOR) 10 MG tablet TAKE 1 TABLET(10 MG) BY MOUTH DAILY 90 tablet 0 09/19/2018     Allergies  Allergen Reactions  . Celebrex [Celecoxib] Hives  . Other Itching    FAB LAUNDRY DETERGENT (itching)  . Penicillins Hives and Other (See Comments)    Has patient had a PCN reaction causing immediate rash, facial/tongue/throat swelling, SOB or lightheadedness with hypotension: No Has patient had a PCN reaction causing severe rash involving mucus membranes or skin necrosis: No Has patient had a PCN reaction that required hospitalization No Has patient had a PCN reaction occurring within the last 10 years: No If all of the above answers are "NO", then may proceed with Cephalosporin use.     Past Medical History:  Diagnosis Date  . Atrial fibrillation ([place])   . Cancer ([place])   . Collagen vascular disease ([place])   . Depression   . Diabetes mellitus without complication ([place])   . Dysrhythmia   . Fibromyalgia   . GERD (gastroesophageal reflux disease)   . H/O  Bell's palsy   . History of [place] measles   . Hypertension   . Mini stroke ([place])   . Non Hodgkin's lymphoma ([place]) 2017  . Osteoarthrosis, unspecified whether generalized or localized, lower leg   . Sixth cranial nerve palsy   . Sleep apnea   . Stroke [facility])     Review of systems:  Otherwise negative.    Physical Exam  Gen: Alert, oriented. Appears stated age.  HEENT: [place]/AT. PERRLA. Lungs: CTA, no wheezes. CV: RR nl S1, S2. Abd: soft, benign, no masses. BS+ Ext: No edema. Pulses 2+    Planned procedures: Proceed with colonoscopy. The patient understands the nature of the planned procedure, indications, risks, alternatives and potential complications including but not limited to bleeding, infection,  perforation, damage to internal organs and possible oversedation/side effects from anesthesia. The patient agrees and gives consent to proceed.  Please refer to procedure notes for findings, recommendations and patient disposition/instructions.     Mason Houston K. Mason Houston, M.D. Gastroenterology 09/21/2018  8:17 AM

## 2018-09-21 NOTE — Anesthesia Postprocedure Evaluation (Signed)
Anesthesia Post Note  Patient: Mason Houston  Procedure(s) Performed: COLONOSCOPY WITH PROPOFOL (N/A )  Patient location during evaluation: PACU Anesthesia Type: General Level of consciousness: awake and alert Pain management: pain level controlled Vital Signs Assessment: post-procedure vital signs reviewed and stable Respiratory status: spontaneous breathing, nonlabored ventilation, respiratory function stable and patient connected to nasal cannula oxygen Cardiovascular status: blood pressure returned to baseline and stable Postop Assessment: no apparent nausea or vomiting Anesthetic complications: no     Last Vitals:  Vitals:   09/21/18 0744 09/21/18 0852  BP: (!) 154/93 (!) 93/44  Pulse: (!) 111 89  Resp: 18 15  Temp: (!) 36 C (!) 35.9 C  SpO2: 98%     Last Pain:  Vitals:   09/21/18 0852  TempSrc: Tympanic  PainSc: 0-No pain                 Molli Barrows

## 2018-09-21 NOTE — Anesthesia Preprocedure Evaluation (Signed)
Anesthesia Evaluation  Patient identified by MRN, date of birth, ID band Patient awake    Reviewed: Allergy & Precautions, H&P , NPO status , Patient's Chart, lab work & pertinent test results, reviewed documented beta blocker date and time   Airway Mallampati: II   Neck ROM: full    Dental  (+) Poor Dentition   Pulmonary sleep apnea and Continuous Positive Airway Pressure Ventilation , pneumonia, former smoker,    Pulmonary exam normal        Cardiovascular Exercise Tolerance: Poor hypertension, + dysrhythmias Atrial Fibrillation  Rhythm:regular Rate:Normal     Neuro/Psych PSYCHIATRIC DISORDERS Depression  Neuromuscular disease CVA, No Residual Symptoms    GI/Hepatic Neg liver ROS, GERD  ,  Endo/Other  negative endocrine ROSdiabetes  Renal/GU negative Renal ROS  negative genitourinary   Musculoskeletal   Abdominal   Peds  Hematology negative hematology ROS (+)   Anesthesia Other Findings Past Medical History: No date: Atrial fibrillation (HCC) No date: Cancer ([place]) No date: Collagen vascular disease (HCC) No date: Depression No date: Diabetes mellitus without complication (HCC) No date: Dysrhythmia No date: Fibromyalgia No date: GERD (gastroesophageal reflux disease) No date: H/O Bell's palsy No date: History of [place] measles No date: Hypertension No date: Mini stroke ([place]) 2017: Non Hodgkin's lymphoma ([place]) No date: Osteoarthrosis, unspecified whether generalized or localized,  lower leg 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 No date: Sleep apnea No date: Stroke [facility]) Past Surgical History: 2012: COLONOSCOPY     Comment:  Dr Candace Cruise 09/09/2017: COLONOSCOPY WITH PROPOFOL; N/A     Comment:  Procedure: COLONOSCOPY WITH PROPOFOL;  Surgeon: [place],               Benay Pike, MD;  Location: [facility] ENDOSCOPY;  Service:               Gastroenterology;  Laterality: N/A; No date: CYST EXCISION 12/29/2014: ERCP; N/A  Comment:  Procedure: ENDOSCOPIC RETROGRADE               CHOLANGIOPANCREATOGRAPHY (ERCP);  Surgeon: Hulen Luster, MD;              Location: [facility] ENDOSCOPY;  Service: Endoscopy;                Laterality: N/A; 02/21/2015: ERCP; N/A     Comment:  Procedure: ENDOSCOPIC RETROGRADE               CHOLANGIOPANCREATOGRAPHY (ERCP);  Surgeon: Hulen Luster, MD;              Location: [facility] ENDOSCOPY;  Service: Gastroenterology;                Laterality: N/A; 08/14/2015: ERCP; N/A     Comment:  Procedure: ENDOSCOPIC RETROGRADE               CHOLANGIOPANCREATOGRAPHY (ERCP) Stent removal;  Surgeon:               [name], MD;  Location: [facility] ENDOSCOPY;  Service:               Endoscopy;  Laterality: N/A; 01/31/2015: PERIPHERAL VASCULAR CATHETERIZATION; N/A     Comment:  Procedure: Porta Cath Insertion;  Surgeon: Katha Cabal, MD;  Location: [place] CV LAB;  Service:               Cardiovascular;  Laterality: N/A; 12/18/2015: PERIPHERAL VASCULAR CATHETERIZATION; N/A  Comment:  Procedure: Porta Cath Removal;  Surgeon: Katha Cabal, MD;  Location: [place] CV LAB;  Service:               Cardiovascular;  Laterality: N/A; No date: TRIGGER FINGER RELEASE BMI    Body Mass Index: 42.29 kg/m     Reproductive/Obstetrics negative OB ROS                             Anesthesia Physical Anesthesia Plan  ASA: III  Anesthesia Plan: General   Post-op Pain Management:    Induction:   PONV Risk Score and Plan:   Airway Management Planned:   Additional Equipment:   Intra-op Plan:   Post-operative Plan:   Informed Consent: I have reviewed the patients History and Physical, chart, labs and discussed the procedure including the risks, benefits and alternatives for the proposed anesthesia with the patient or authorized representative who has indicated his/her understanding and acceptance.     Dental Advisory Given  Plan  Discussed with: CRNA  Anesthesia Plan Comments:         Anesthesia Quick Evaluation

## 2018-09-21 NOTE — Anesthesia Post-op Follow-up Note (Signed)
Anesthesia QCDR form completed.        

## 2018-09-21 NOTE — Op Note (Signed)
[facility] Gastroenterology Patient Name: Mason Houston Procedure Date: 09/21/2018 7:27 AM MRN: [id number] Account #: [id number] Date of Birth: Mar 23, 1950 Admit Type: Outpatient Age: 68 Room: [facility] ENDO ROOM 2 Gender: Male Note Status: Finalized Procedure:            Colonoscopy Indications:          High risk colon cancer surveillance: Personal history                        of adenoma with high grade dysplasia Providers:            Benay Pike. Alice Reichert MD, MD Referring MD:         Andres Labrum, MD (Referring MD) Medicines:            Propofol per Anesthesia Complications:        No immediate complications. Procedure:            Pre-Anesthesia Assessment:                       - The risks and benefits of the procedure and the                        sedation options and risks were discussed with the                        patient. All questions were answered and informed                        consent was obtained.                       - Patient identification and proposed procedure were                        verified prior to the procedure by the nurse. The                        procedure was verified in the procedure room.                       - ASA Grade Assessment: III - A patient with severe                        systemic disease.                       - After reviewing the risks and benefits, the patient                        was deemed in satisfactory condition to undergo the                        procedure.                       After obtaining informed consent, the colonoscope was                        passed under direct vision. Throughout the procedure,  the patient's blood pressure, pulse, and oxygen                        saturations were monitored continuously. The                        Colonoscope was introduced through the anus and                        advanced to the the cecum, identified by appendiceal                   orifice and ileocecal valve. The colonoscopy was                        performed without difficulty. The patient tolerated the                        procedure well. The quality of the bowel preparation                        was good. The ileocecal valve, appendiceal orifice, and                        rectum were photographed. Findings:      The perianal and digital rectal examinations were normal. Pertinent       negatives include normal sphincter tone and no palpable rectal lesions.      Two sessile polyps were found in the rectum. The polyps were 3 to 6 mm       in size. These polyps were removed with a cold snare. Resection and       retrieval were complete.      Non-bleeding internal hemorrhoids were found during retroflexion. The       hemorrhoids were Grade I (internal hemorrhoids that do not prolapse).      The exam was otherwise without abnormality.      A retained hemoclip was noted in the cecum section of the colon and was       intact and attached to post polyp site from 09/11/17 polypectomy. No       residual polyp tissue was identified. . Impression:           - Two 3 to 6 mm polyps in the rectum, removed with a                        cold snare. Resected and retrieved.                       - Non-bleeding internal hemorrhoids.                       - The examination was otherwise normal. Recommendation:       - Patient has a contact number available for                        emergencies. The signs and symptoms of potential                        delayed complications were discussed with the patient.  Return to normal activities tomorrow. Written discharge                        instructions were provided to the patient.                       - Resume previous diet.                       - Continue present medications.                       - Await pathology results.                       - Repeat colonoscopy is recommended for  surveillance.                        The colonoscopy date will be determined after pathology                        results from today's exam become available for review.                       - Return to GI office PRN.                       - The findings and recommendations were discussed with                        the patient. Procedure Code(s):    --- Professional ---                       [phone], Colonoscopy, flexible; with removal of tumor(s),                        polyp(s), or other lesion(s) by snare technique Diagnosis Code(s):    --- Professional ---                       K64.0, First degree hemorrhoids                       K62.1, Rectal polyp                       Z86.010, Personal history of colonic polyps CPT copyright 2019 American Medical Association. All rights reserved. The codes documented in this report are preliminary and upon coder review may  be revised to meet current compliance requirements. Efrain Sella MD, MD 09/21/2018 8:51:39 AM This report has been signed electronically. Number of Addenda: 0 Note Initiated On: 09/21/2018 7:27 AM Scope Withdrawal Time: 0 hours 7 minutes 47 seconds  Total Procedure Duration: 0 hours 16 minutes 2 seconds  Estimated Blood Loss: Estimated blood loss: none. Estimated blood loss: none.                        Estimated blood loss: none.      [facility]

## 2018-09-21 NOTE — Interval H&P Note (Signed)
History and Physical Interval Note:  09/21/2018 8:18 AM  Mason Houston  has presented today for surgery, with the diagnosis of PH colon polyps.  The various methods of treatment have been discussed with the patient and family. After consideration of risks, benefits and other options for treatment, the patient has consented to  Procedure(s): COLONOSCOPY WITH PROPOFOL (N/A) as a surgical intervention.  The patient's history has been reviewed, patient examined, no change in status, stable for surgery.  I have reviewed the patient's chart and labs.  Questions were answered to the patient's satisfaction.     [name], [name]

## 2018-09-21 NOTE — Transfer of Care (Signed)
Immediate Anesthesia Transfer of Care Note  Patient: Mason Houston  Procedure(s) Performed: COLONOSCOPY WITH PROPOFOL (N/A )  Patient Location: PACU  Anesthesia Type:General  Level of Consciousness: awake and alert   Airway & Oxygen Therapy: Patient Spontanous Breathing and Patient connected to face mask oxygen  Post-op Assessment: Report given to RN and Post -op Vital signs reviewed and stable  Post vital signs: Reviewed and stable  Last Vitals:  Vitals Value Taken Time  BP    Temp    Pulse 89 09/21/18 0852  Resp 16 09/21/18 0852  SpO2 99 % 09/21/18 0852  Vitals shown include unvalidated device data.  Last Pain:  Vitals:   09/21/18 0744  TempSrc: Oral  PainSc: 0-No pain         Complications: No apparent anesthesia complications

## 2018-09-22 ENCOUNTER — Encounter: Payer: Self-pay | Admitting: Internal Medicine

## 2018-09-22 LAB — SURGICAL PATHOLOGY

## 2018-11-06 ENCOUNTER — Other Ambulatory Visit: Payer: Self-pay | Admitting: Cardiovascular Disease

## 2018-11-08 NOTE — Telephone Encounter (Signed)
Please schedule 6 mo F/U with Dr. Fletcher Anon. Thank you!

## 2018-11-10 NOTE — Telephone Encounter (Signed)
lmov to schedule  °

## 2018-11-12 NOTE — Telephone Encounter (Signed)
lmov to schedule  °

## 2018-11-23 ENCOUNTER — Other Ambulatory Visit: Payer: Self-pay

## 2018-11-23 ENCOUNTER — Inpatient Hospital Stay: Payer: Medicare HMO | Admitting: Internal Medicine

## 2018-11-23 ENCOUNTER — Inpatient Hospital Stay: Payer: Medicare HMO

## 2018-11-23 MED ORDER — RIVAROXABAN 20 MG PO TABS: ORAL_TABLET | ORAL | 1 refills | Status: DC

## 2018-11-23 NOTE — Telephone Encounter (Signed)
*  STAT* If patient is at the pharmacy, call can be transferred to refill team.   1. Which medications need to be refilled? (please list name of each medication and dose if known) Xarelto 2. Which pharmacy/location (including street and city if local pharmacy) is medication to be sent to? [place]  3. Do they need a 30 day or 90 day supply? [place]

## 2018-11-23 NOTE — Assessment & Plan Note (Deleted)
#   DLBCL of pancreatic head-  Stage IE. S/p R-CHOP [finished 2017 April]. Stable.   # Currently no evidence of disease; monitor clinically.   # Elevated Blood Pressure- s/p evaluation with Cardiology. Continue On anti-HTN/defer to cards/PCP.   # Paroxysmal A.fib- on xarelto stable.   # weight gain- recommend exercise/physical activity.   # DISPOSITION: # Follow up in 6 months/-MD-labs-cbc/cmp/dh-Dr.B.   

## 2018-11-23 NOTE — Progress Notes (Deleted)
[place] OFFICE PROGRESS NOTE  Patient Care Team: Baxter Hire, MD as PCP - General (Internal Medicine) [place], MD as PCP - Cardiology (Cardiology) [name], MD (Infectious Diseases) [place] Hampshire, MD as Consulting Physician (Cardiology) Bary Castilla Forest Gleason, MD (General Surgery) Roselee Nova, MD as Referring Physician [facility])  Cancer Staging No matching staging information was found for the patient.   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 [facility] ]  --------------------------------------------------------   DIAGNOSIS: [Jan 2017 ]- DLBCL STAGE: IE  ;GOALS: CURATIVE CURRENT/MOST RECENT THERAPY [finished April 2017- R-CHOP X5]-surveillance    Diffuse large b-cell lymphoma, intra-abdominal lymph nodes ([place])      INTERVAL HISTORY:  Mason Houston 68 y.o.  male pleasant patient above history of diffuse large B-cell lymphoma is here for follow-up.  Patient states that she is under a lot of stress at home given relationship issues within his family.  Otherwise denies any abdominal pain.  Denies any nausea vomiting.  Denies any headaches.  No abdominal pain.  He has been trying to lose weight.  He had a recent colonoscopy.  Review of Systems  Constitutional: Negative for chills, diaphoresis, fever, malaise/fatigue and weight loss.  HENT: Negative for nosebleeds and  sore throat.   Eyes: Negative for double vision.  Respiratory: Negative for cough, hemoptysis, sputum production, shortness of breath and wheezing.   Cardiovascular: Negative for chest pain, palpitations, orthopnea and leg swelling.  Gastrointestinal: Negative for abdominal pain, blood in stool, constipation, diarrhea, heartburn, melena, nausea and vomiting.  Genitourinary: Negative for dysuria, frequency and urgency.  Musculoskeletal: Negative for back pain and joint pain.  Skin: Negative.  Negative for itching and rash.  Neurological: Negative for tingling, focal weakness, weakness and headaches.  Endo/Heme/Allergies: Does not bruise/bleed easily.  Psychiatric/Behavioral: Negative for depression. The patient is nervous/anxious. The patient does not have insomnia.       PAST MEDICAL HISTORY :  Past Medical History:  Diagnosis Date  . Atrial fibrillation ([place])   . Cancer ([place])   . Collagen vascular disease ([place])   . Depression   . Diabetes mellitus without complication ([place])   . Dysrhythmia   . Fibromyalgia   . GERD (gastroesophageal reflux disease)   . H/O Bell's palsy   . History of [place] measles   . Hypertension   . Mini stroke ([place])   . Non Hodgkin's lymphoma ([place]) 2017  . Osteoarthrosis, unspecified whether generalized or localized, lower leg   . Sixth cranial nerve palsy   . Sleep apnea   . Stroke [facility])     PAST SURGICAL HISTORY :   Past Surgical History:  Procedure Laterality Date  . COLONOSCOPY  2012   Dr Candace Cruise  . COLONOSCOPY WITH PROPOFOL N/A 09/09/2017   Procedure: COLONOSCOPY WITH PROPOFOL;  Surgeon: Toledo, Benay Pike, MD;  Location: [facility] ENDOSCOPY;  Service: Gastroenterology;  Laterality: N/A;  . COLONOSCOPY WITH PROPOFOL N/A 09/21/2018   Procedure:  COLONOSCOPY WITH PROPOFOL;  Surgeon: Toledo, Benay Pike, MD;  Location: [facility] ENDOSCOPY;  Service: Gastroenterology;  Laterality: N/A;  . CYST EXCISION    . ERCP N/A 12/29/2014   Procedure: ENDOSCOPIC RETROGRADE  CHOLANGIOPANCREATOGRAPHY (ERCP);  Surgeon: Hulen Luster, MD;  Location: [facility] ENDOSCOPY;  Service: Endoscopy;  Laterality: N/A;  . ERCP N/A 02/21/2015   Procedure: ENDOSCOPIC RETROGRADE CHOLANGIOPANCREATOGRAPHY (ERCP);  Surgeon: Hulen Luster, MD;  Location: [facility] ENDOSCOPY;  Service: Gastroenterology;  Laterality: N/A;  . ERCP N/A 08/14/2015   Procedure: ENDOSCOPIC RETROGRADE CHOLANGIOPANCREATOGRAPHY (ERCP) Stent removal;  Surgeon: [name], MD;  Location: [facility] ENDOSCOPY;  Service: Endoscopy;  Laterality: N/A;  . PERIPHERAL VASCULAR CATHETERIZATION N/A 01/31/2015   Procedure: Glori Luis Cath Insertion;  Surgeon: Katha Cabal, MD;  Location: [place] CV LAB;  Service: Cardiovascular;  Laterality: N/A;  . PERIPHERAL VASCULAR CATHETERIZATION N/A 12/18/2015   Procedure: Glori Luis Cath Removal;  Surgeon: Katha Cabal, MD;  Location: [place] CV LAB;  Service: Cardiovascular;  Laterality: N/A;  . TRIGGER FINGER RELEASE      FAMILY HISTORY :   Family History  Problem Relation Age of Onset  . Hypertension Mother   . Heart attack Father   . Stroke Father     SOCIAL HISTORY:   Social History   Tobacco Use  . Smoking status: Former Smoker    Packs/day: 4.50    Years: 15.00    Pack years: 67.50    Types: Cigarettes  . Smokeless tobacco: Never Used  Substance Use Topics  . Alcohol use: No  . Drug use: No    Types: Marijuana    Comment: PAST    ALLERGIES:  is allergic to celebrex [celecoxib]; other; and penicillins.  MEDICATIONS:  Current Outpatient Medications  Medication Sig Dispense Refill  . acetaminophen (TYLENOL) 650 MG CR tablet Take 1,300 mg by mouth daily.    . Ascorbic Acid (VITAMIN C) 1000 MG tablet Take 1,000 mg by mouth daily.    Marland Kitchen b complex vitamins capsule Take 1 capsule by mouth daily.     . cholecalciferol (VITAMIN D) 1000 units tablet Take 1,000 Units by mouth daily.    . ciclopirox (PENLAC) 8 % solution Apply topically at bedtime. Apply over nail and surrounding  skin. Apply daily over previous coat. After seven (7) days, may remove with alcohol and continue cycle.    . diltiazem (TIAZAC) 360 MG 24 hr capsule TAKE 1 CAPSULE BY MOUTH EVERY DAY 90 capsule 3  . fluticasone (FLONASE) 50 MCG/ACT nasal spray Place 1 spray into both nostrils daily.    Marland Kitchen loratadine (CLARITIN) 10 MG tablet Take 10 mg by mouth daily.    Marland Kitchen losartan (COZAAR) 50 MG tablet Take 1 tablet by mouth once daily 90 tablet 0  . magnesium oxide (MAG-OX) 400 (241.3 Mg) MG tablet Take 250 mg daily by mouth.     . metFORMIN (GLUCOPHAGE) 500 MG tablet Take 500 mg by mouth 2 (two) times daily with a meal.  11  . metFORMIN (GLUCOPHAGE) 500 MG tablet Take by mouth 2 (two) times daily with a meal.    . omeprazole (PRILOSEC) 20 MG capsule Take 1 capsule (20 mg total) by mouth daily. 90 capsule 1  . rivaroxaban (XARELTO) 20 MG TABS tablet TAKE 1 TABLET BY MOUTH ONCE DAILY WITH SUPPER 90 tablet 0  . rosuvastatin (CRESTOR) 10 MG tablet TAKE 1 TABLET(10 MG) BY MOUTH DAILY 90 tablet 0   No current facility-administered medications for this visit.  PHYSICAL EXAMINATION: ECOG PERFORMANCE STATUS: 0 - Asymptomatic  There were no vitals taken for this visit.  There were no vitals filed for this visit.  Physical Exam  Constitutional: He is oriented to person, place, and time and well-developed, well-nourished, and in no distress.  HENT:  Head: Normocephalic and atraumatic.  Mouth/Throat: Oropharynx is clear and moist. No oropharyngeal exudate.  Eyes: Pupils are equal, round, and reactive to light.  Neck: Normal range of motion. Neck supple.  Cardiovascular: Normal rate and regular rhythm.  Pulmonary/Chest: No respiratory distress. He has no wheezes.  Abdominal: Soft. Bowel sounds are normal. He exhibits no distension and no mass. There is no abdominal tenderness. There is no rebound and no guarding.  Musculoskeletal: Normal range of motion.        General: No tenderness or edema.  Neurological:  He is alert and oriented to person, place, and time.  Skin: Skin is warm.  Psychiatric: Affect normal.       LABORATORY DATA:  I have reviewed the data as listed    Component Value Date/Time   NA 142 05/24/2018 0933   NA 142 05/15/2015 1156   NA 139 12/19/2011 1044   K 4.7 05/24/2018 0933   K 4.1 12/19/2011 1044   CL 105 05/24/2018 0933   CL 106 12/19/2011 1044   CO2 29 05/24/2018 0933   CO2 26 12/19/2011 1044   GLUCOSE 144 (H) 05/24/2018 0933   GLUCOSE 95 12/19/2011 1044   BUN 21 05/24/2018 0933   BUN 16 05/15/2015 1156   BUN 18 12/19/2011 1044   CREATININE 1.09 05/24/2018 0933   CREATININE 0.88 05/08/2016 1152   CALCIUM 8.9 05/24/2018 0933   CALCIUM 9.0 12/19/2011 1044   PROT 7.3 05/24/2018 0933   PROT 6.1 05/15/2015 1156   PROT 9.0 (H) 12/19/2011 1044   ALBUMIN 4.1 05/24/2018 0933   ALBUMIN 3.7 05/15/2015 1156   ALBUMIN 4.4 12/19/2011 1044   AST 25 05/24/2018 0933   AST 44 (H) 12/19/2011 1044   ALT 22 05/24/2018 0933   ALT 46 12/19/2011 1044   ALKPHOS 71 05/24/2018 0933   ALKPHOS 110 12/19/2011 1044   BILITOT 0.7 05/24/2018 0933   BILITOT 0.2 05/15/2015 1156   BILITOT 0.7 12/19/2011 1044   GFRNONAA >60 05/24/2018 0933   GFRNONAA >89 05/08/2016 1152   GFRAA >60 05/24/2018 0933   GFRAA >89 05/08/2016 1152    No results found for: SPEP, UPEP  Lab Results  Component Value Date   WBC 5.8 05/24/2018   NEUTROABS 2.9 05/24/2018   HGB 13.2 05/24/2018   HCT 41.0 05/24/2018   MCV 86.5 05/24/2018   PLT 311 05/24/2018      Chemistry      Component Value Date/Time   NA 142 05/24/2018 0933   NA 142 05/15/2015 1156   NA 139 12/19/2011 1044   K 4.7 05/24/2018 0933   K 4.1 12/19/2011 1044   CL 105 05/24/2018 0933   CL 106 12/19/2011 1044   CO2 29 05/24/2018 0933   CO2 26 12/19/2011 1044   BUN 21 05/24/2018 0933   BUN 16 05/15/2015 1156   BUN 18 12/19/2011 1044   CREATININE 1.09 05/24/2018 0933   CREATININE 0.88 05/08/2016 1152      Component Value  Date/Time   CALCIUM 8.9 05/24/2018 0933   CALCIUM 9.0 12/19/2011 1044   ALKPHOS 71 05/24/2018 0933   ALKPHOS 110 12/19/2011 1044   AST 25 05/24/2018 0933   AST 44 (H) 12/19/2011 1044  ALT 22 05/24/2018 0933   ALT 46 12/19/2011 1044   BILITOT 0.7 05/24/2018 0933   BILITOT 0.2 05/15/2015 1156   BILITOT 0.7 12/19/2011 1044       RADIOGRAPHIC STUDIES: I have personally reviewed the radiological images as listed and agreed with the findings in the report. No results found.   ASSESSMENT & PLAN:  No problem-specific Assessment & Plan notes found for this encounter.   No orders of the defined types were placed in this encounter.  All questions were answered. The patient knows to call the clinic with any problems, questions or concerns.      Cammie Sickle, MD 11/23/2018 7:40 AM

## 2018-11-23 NOTE — Telephone Encounter (Signed)
Prescription refill request for Xarelto received.   Last office visit: Fletcher Anon, (05-14-2018) Weight: 122 kg Age: 68 y.o. Scr: 1.09, (05-24-2018) CrCl: 115 ml/min  Prescription refill sent.

## 2018-11-23 NOTE — Telephone Encounter (Signed)
Scheduled

## 2018-11-23 NOTE — Telephone Encounter (Signed)
Please review for refill. Thanks!  

## 2018-12-08 ENCOUNTER — Other Ambulatory Visit: Payer: Self-pay

## 2018-12-09 ENCOUNTER — Other Ambulatory Visit: Payer: Self-pay

## 2018-12-09 ENCOUNTER — Inpatient Hospital Stay: Payer: Medicare HMO | Attending: Internal Medicine

## 2018-12-09 DIAGNOSIS — C8338 Diffuse large B-cell lymphoma, lymph nodes of multiple sites: Secondary | ICD-10-CM | POA: Insufficient documentation

## 2018-12-09 DIAGNOSIS — Z7901 Long term (current) use of anticoagulants: Secondary | ICD-10-CM | POA: Insufficient documentation

## 2018-12-09 DIAGNOSIS — E119 Type 2 diabetes mellitus without complications: Secondary | ICD-10-CM | POA: Insufficient documentation

## 2018-12-09 DIAGNOSIS — C8333 Diffuse large B-cell lymphoma, intra-abdominal lymph nodes: Secondary | ICD-10-CM

## 2018-12-09 DIAGNOSIS — I480 Paroxysmal atrial fibrillation: Secondary | ICD-10-CM | POA: Insufficient documentation

## 2018-12-09 LAB — COMPREHENSIVE METABOLIC PANEL
ALT: 26 U/L (ref 0–44)
AST: 27 U/L (ref 15–41)
Albumin: 4.2 g/dL (ref 3.5–5.0)
Alkaline Phosphatase: 68 U/L (ref 38–126)
Anion gap: 10 (ref 5–15)
BUN: 19 mg/dL (ref 8–23)
CO2: 26 mmol/L (ref 22–32)
Calcium: 9.2 mg/dL (ref 8.9–10.3)
Chloride: 102 mmol/L (ref 98–111)
Creatinine, Ser: 1.05 mg/dL (ref 0.61–1.24)
GFR calc Af Amer: >60 mL/min (ref >60)
GFR calc non Af Amer: >60 mL/min (ref >60)
Glucose, Bld: 139 mg/dL — ABNORMAL HIGH (ref 70–99)
Potassium: 4.7 mmol/L (ref 3.5–5.1)
Sodium: 138 mmol/L (ref 135–145)
Total Bilirubin: 0.6 mg/dL (ref 0.3–1.2)
Total Protein: 7.5 g/dL (ref 6.5–8.1)

## 2018-12-09 LAB — CBC WITH DIFFERENTIAL/PLATELET
Abs Immature Granulocytes: 0.03 10*3/uL (ref 0.00–0.07)
Basophils Absolute: 0.1 10*3/uL (ref 0.0–0.1)
Basophils Relative: 1 %
Eosinophils Absolute: 0.2 10*3/uL (ref 0.0–0.5)
Eosinophils Relative: 3 %
HCT: 43.5 % (ref 39.0–52.0)
Hemoglobin: 13.7 g/dL (ref 13.0–17.0)
Immature Granulocytes: 0 %
Lymphocytes Relative: 21 %
Lymphs Abs: 1.5 10*3/uL (ref 0.7–4.0)
MCH: 27.2 pg (ref 26.0–34.0)
MCHC: 31.5 g/dL (ref 30.0–36.0)
MCV: 86.3 fL (ref 80.0–100.0)
Monocytes Absolute: 0.8 10*3/uL (ref 0.1–1.0)
Monocytes Relative: 11 %
Neutro Abs: 4.8 10*3/uL (ref 1.7–7.7)
Neutrophils Relative %: 64 %
Platelets: 348 10*3/uL (ref 150–400)
RBC: 5.04 MIL/uL (ref 4.22–5.81)
RDW: 14.2 % (ref 11.5–15.5)
WBC: 7.5 10*3/uL (ref 4.0–10.5)
nRBC: 0 % (ref 0.0–0.2)

## 2018-12-09 LAB — LACTATE DEHYDROGENASE: LDH: 127 U/L (ref 98–192)

## 2018-12-15 ENCOUNTER — Other Ambulatory Visit: Payer: Self-pay

## 2018-12-15 ENCOUNTER — Inpatient Hospital Stay (HOSPITAL_BASED_OUTPATIENT_CLINIC_OR_DEPARTMENT_OTHER): Payer: Medicare HMO | Admitting: Internal Medicine

## 2018-12-15 DIAGNOSIS — C8333 Diffuse large B-cell lymphoma, intra-abdominal lymph nodes: Secondary | ICD-10-CM

## 2018-12-15 NOTE — Progress Notes (Signed)
I connected with Mason Houston on 12/15/18 at 10:30 AM EST by video enabled telemedicine visit and verified that I am speaking with the correct person using two identifiers.  I discussed the limitations, risks, security and privacy concerns of performing an evaluation and management service by telemedicine and the availability of in-person appointments. I also discussed with the patient that there may be a patient responsible charge related to this service. The patient expressed understanding and agreed to proceed.    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 Toledo]  --------------------------------------------------------   DIAGNOSIS: [Jan 2017 ]- DLBCL STAGE: IE  ;GOALS: CURATIVE CURRENT/MOST RECENT THERAPY [finished April 2017- R-CHOP X5]-surveillance    Diffuse large b-cell lymphoma, intra-abdominal lymph nodes ([place])    Chief Complaint: DLBCL.    History of present illness:Mason Houston 68 y.o.  male with history of diffuse large B-cell lymphoma is here for follow-up.  Interim patient a colonoscopy-noted to have rectal polyps resected.  Recommended repeat colonoscopy in 5 years.  Patient is concerned about his weight gain/unable to exercise because of Covid pandemic.  States his  A. fib is under control.  States his high blood pressure is under control.  Continues with anticoagulation for A. fib.   Observation/objective:  Assessment and plan: Diffuse large b-cell lymphoma, intra-abdominal lymph nodes (HCC) # DLBCL of pancreatic head-  Stage IE. S/p R-CHOP [finished 2017 April]. Stable.  Currently no evidence of disease; monitor clinically.   # DM- FBS- 139; continue metformin; close monitoring.  Stable.  # Paroxysmal A.fib- on xarelto-[Dr.Arida] STABLE.    # weight gain- stable; recommend exercise/physical activity.   # # Educated the patient regarding novel coronavirus-modes of transmission/risks; and measures to avoid infection. Also counseled re: covid- 19 vaccine.   # DISPOSITION:  # wife-cell phone/ please [id number].  # Follow up in 6 months/-MD-labs-cbc/cmp/dh-Dr.B.    Follow-up instructions:  I discussed the assessment and treatment plan with the patient.  The patient was provided an opportunity to ask questions and all were answered.  The patient agreed with the plan and demonstrated understanding of instructions.  The patient was advised to call back or seek an in person evaluation if the symptoms worsen or if the condition fails to improve as anticipated. Dr. Charlaine Dalton [place] at [facility] 12/15/2018 4:28 PM

## 2018-12-15 NOTE — Assessment & Plan Note (Addendum)
#   DLBCL of pancreatic head-  Stage IE. S/p R-CHOP [finished 2017 April]. Stable.  Currently no evidence of disease; monitor clinically.   # DM- FBS- 139; continue metformin; close monitoring.  Stable.  # Paroxysmal A.fib- on xarelto-[Dr.Arida] STABLE.    # weight gain- stable; recommend exercise/physical activity.   # # Educated the patient regarding novel coronavirus-modes of transmission/risks; and measures to avoid infection. Also counseled re: covid- 19 vaccine.   # DISPOSITION:  # wife-cell phone/ please [id number].  # Follow up in 6 months/-MD-labs-cbc/cmp/dh-Dr.B.

## 2018-12-16 ENCOUNTER — Telehealth (INDEPENDENT_AMBULATORY_CARE_PROVIDER_SITE_OTHER): Payer: Medicare HMO | Admitting: Cardiovascular Disease

## 2018-12-16 ENCOUNTER — Encounter: Payer: Self-pay | Admitting: Cardiovascular Disease

## 2018-12-16 VITALS — BP 150/88 | HR 79 | Ht 67.0 in | Wt 270.0 lb

## 2018-12-16 DIAGNOSIS — I10 Essential (primary) hypertension: Secondary | ICD-10-CM

## 2018-12-16 DIAGNOSIS — E785 Hyperlipidemia, unspecified: Secondary | ICD-10-CM

## 2018-12-16 DIAGNOSIS — I482 Chronic atrial fibrillation, unspecified: Secondary | ICD-10-CM

## 2018-12-16 MED ORDER — LOSARTAN POTASSIUM 100 MG PO TABS: 100.0000 mg | ORAL_TABLET | Freq: Every day | ORAL | 3 refills | Status: DC

## 2018-12-16 NOTE — Patient Instructions (Signed)
Medication Instructions:  Your physician has recommended you make the following change in your medication:   INCREASE Losartan to 100 mg daily. An Rx has been sent to your pharmacy. *If you need a refill on your cardiac medications before your next appointment, please call your pharmacy*  Lab Work: None ordered If you have labs (blood work) drawn today and your tests are completely normal, you will receive your results only by: Marland Kitchen MyChart Message (if you have MyChart) OR . A paper copy in the mail If you have any lab test that is abnormal or we need to change your treatment, we will call you to review the results.  Testing/Procedures: None ordered  Follow-Up: At [facility], you and your health needs are our priority.  As part of our continuing mission to provide you with exceptional heart care, we have created designated Provider Care Teams.  These Care Teams include your primary Cardiologist (physician) and Advanced Practice Providers (APPs -  Physician Assistants and Nurse Practitioners) who all work together to provide you with the care you need, when you need it.  Your next appointment:   6 month(s)  The format for your next appointment:   In Person  Provider:    You may see Kathlyn Sacramento, MD or one of the following Advanced Practice Providers on your designated Care Team:    Murray Hodgkins, NP  Christell Faith, PA-C  Marrianne Mood, PA-C   Other Instructions N/A

## 2018-12-16 NOTE — Progress Notes (Signed)
Virtual Visit via Video Note   This visit type was conducted due to national recommendations for restrictions regarding the COVID-19 Pandemic (e.g. social distancing) in an effort to limit this patient's exposure and mitigate transmission in our community.  Due to his co-morbid illnesses, this patient is at least at moderate risk for complications without adequate follow up.  This format is felt to be most appropriate for this patient at this time.  The patient did not have access to video technology/had technical difficulties with video requiring transitioning to audio format only (telephone).  All issues noted in this document were discussed and addressed.  No physical exam could be performed with this format.  Please refer to the patient's chart for his  consent to telehealth for [facility].   Date:  12/16/2018   ID:  Mason Houston, DOB July 25, 1950, MRN [id number]  Patient Location: Home Provider Location: Office  PCP:  Baxter Hire, MD  Cardiologist:  Kathlyn Sacramento, MD  Electrophysiologist:  None   Evaluation Performed:  Follow-Up Visit  Chief Complaint: Weight gain  History of Present Illness:    Mason Houston is a 68 y.o. male was reached via video visit for follow-up visit regarding chronic atrial fibrillation.   The patient has a long history of  hypertension and obesity. He was diagnosed with atrial fibrillation with rapid ventricular response during a routine physical  in December of 2013 in the setting of heavy caffeine intake. He also has sleep apnea currently effectively treated with CPAP.  He had a stroke in August 2015.   He was diagnosed with non-Hodgkin's lymphoma in 2017 and was treated successfully with chemotherapy. Echocardiogram In July 2017 showed normal LV systolic function.   He is being treated with rate control for atrial fibrillation given lack of symptoms.  He has been doing well with no recent chest pain, shortness of breath or palpitations.   He has not been able to lose any weight.  His blood pressure continues to be elevated at home.  He had labs done yesterday which were unremarkable  The patient does not have symptoms concerning for COVID-19 infection (fever, chills, cough, or new shortness of breath).    Past Medical History:  Diagnosis Date  . Atrial fibrillation ([place])   . Cancer ([place])   . Collagen vascular disease ([place])   . Depression   . Diabetes mellitus without complication ([place])   . Dysrhythmia   . Fibromyalgia   . GERD (gastroesophageal reflux disease)   . H/O Bell's palsy   . History of [place] measles   . Hypertension   . Mini stroke ([place])   . Non Hodgkin's lymphoma ([place]) 2017  . Osteoarthrosis, unspecified whether generalized or localized, lower leg   . Sixth cranial nerve palsy   . Sleep apnea   . Stroke [facility])    Past Surgical History:  Procedure Laterality Date  . COLONOSCOPY  2012   Dr Candace Cruise  . COLONOSCOPY WITH PROPOFOL N/A 09/09/2017   Procedure: COLONOSCOPY WITH PROPOFOL;  Surgeon: Toledo, Benay Pike, MD;  Location: [facility] ENDOSCOPY;  Service: Gastroenterology;  Laterality: N/A;  . COLONOSCOPY WITH PROPOFOL N/A 09/21/2018   Procedure: COLONOSCOPY WITH PROPOFOL;  Surgeon: Toledo, Benay Pike, MD;  Location: [facility] ENDOSCOPY;  Service: Gastroenterology;  Laterality: N/A;  . CYST EXCISION    . ERCP N/A 12/29/2014   Procedure: ENDOSCOPIC RETROGRADE CHOLANGIOPANCREATOGRAPHY (ERCP);  Surgeon: Hulen Luster, MD;  Location: [facility] ENDOSCOPY;  Service: Endoscopy;  Laterality: N/A;  . ERCP N/A  02/21/2015   Procedure: ENDOSCOPIC RETROGRADE CHOLANGIOPANCREATOGRAPHY (ERCP);  Surgeon: Hulen Luster, MD;  Location: [facility] ENDOSCOPY;  Service: Gastroenterology;  Laterality: N/A;  . ERCP N/A 08/14/2015   Procedure: ENDOSCOPIC RETROGRADE CHOLANGIOPANCREATOGRAPHY (ERCP) Stent removal;  Surgeon: [name], MD;  Location: [facility] ENDOSCOPY;  Service: Endoscopy;  Laterality: N/A;  . PERIPHERAL VASCULAR CATHETERIZATION N/A 01/31/2015   Procedure:  Glori Luis Cath Insertion;  Surgeon: Katha Cabal, MD;  Location: [place] CV LAB;  Service: Cardiovascular;  Laterality: N/A;  . PERIPHERAL VASCULAR CATHETERIZATION N/A 12/18/2015   Procedure: Glori Luis Cath Removal;  Surgeon: Katha Cabal, MD;  Location: [place] CV LAB;  Service: Cardiovascular;  Laterality: N/A;  . TRIGGER FINGER RELEASE       Current Meds  Medication Sig  . acetaminophen (TYLENOL) 650 MG CR tablet Take 1,300 mg by mouth daily.  . Ascorbic Acid (VITAMIN C) 1000 MG tablet Take 1,000 mg by mouth daily.  Marland Kitchen b complex vitamins capsule Take 1 capsule by mouth daily.   . cholecalciferol (VITAMIN D) 1000 units tablet Take 1,000 Units by mouth daily.  . ciclopirox (PENLAC) 8 % solution Apply topically at bedtime. Apply over nail and surrounding skin. Apply daily over previous coat. After seven (7) days, may remove with alcohol and continue cycle.  . diltiazem (TIAZAC) 360 MG 24 hr capsule TAKE 1 CAPSULE BY MOUTH EVERY DAY  . fluticasone (FLONASE) 50 MCG/ACT nasal spray Place 1 spray into both nostrils daily.  Marland Kitchen loratadine (CLARITIN) 10 MG tablet Take 10 mg by mouth daily.  Marland Kitchen losartan (COZAAR) 50 MG tablet Take 1 tablet by mouth once daily  . magnesium oxide (MAG-OX) 400 (241.3 Mg) MG tablet Take 250 mg daily by mouth.   . metFORMIN (GLUCOPHAGE) 1000 MG tablet Take 1,000 mg by mouth 2 (two) times daily.  Marland Kitchen omeprazole (PRILOSEC) 20 MG capsule Take 1 capsule (20 mg total) by mouth daily.  . rivaroxaban (XARELTO) 20 MG TABS tablet TAKE 1 TABLET BY MOUTH ONCE DAILY WITH SUPPER  . rosuvastatin (CRESTOR) 10 MG tablet TAKE 1 TABLET(10 MG) BY MOUTH DAILY     Allergies:   Celebrex [celecoxib], Other, and Penicillins   Social History   Tobacco Use  . Smoking status: Former Smoker    Packs/day: 4.50    Years: 15.00    Pack years: 67.50    Types: Cigarettes  . Smokeless tobacco: Never Used  Substance Use Topics  . Alcohol use: No  . Drug use: No    Types: Marijuana     Comment: PAST     Family Hx: The patient's family history includes Heart attack in his father; Hypertension in his mother; Stroke in his father.  ROS:   Please see the history of present illness.     All other systems reviewed and are negative.   Prior CV studies:   The following studies were reviewed today:    Labs/Other Tests and Data Reviewed:    EKG:  No ECG reviewed.  Recent Labs: 12/09/2018: ALT 26; BUN 19; Creatinine, Ser 1.05; Hemoglobin 13.7; Platelets 348; Potassium 4.7; Sodium 138   Recent Lipid Panel Lab Results  Component Value Date/Time   CHOL 139 05/08/2016 11:52 AM   CHOL 181 07/03/2015 10:07 AM   TRIG 69 05/08/2016 11:52 AM   HDL 42 05/08/2016 11:52 AM   HDL 44 07/03/2015 10:07 AM   CHOLHDL 3.3 05/08/2016 11:52 AM   LDLCALC 83 05/08/2016 11:52 AM   LDLCALC 122 (H) 07/03/2015 10:07 AM  Wt Readings from Last 3 Encounters:  12/16/18 270 lb (122.5 kg)  09/21/18 270 lb (122.5 kg)  05/14/18 276 lb (125.2 kg)     Objective:    Vital Signs:  BP (!) 150/88   Pulse 79   Ht 5\' 7"  (1.702 m)   Wt 270 lb (122.5 kg)   BMI 42.29 kg/m    VITAL SIGNS:  reviewed  The patient appears to be in no acute distress. Normal respiratory effort. No significant edema.  ASSESSMENT & PLAN:    1.    Chronic atrial fibrillation:  Ventricular rate is controlled with diltiazem.  He is tolerating anticoagulation with Xarelto with no side effects.  I reviewed his labs from yesterday which were unremarkable  2. Essential hypertension: Blood pressure continues to be elevated.  Thus, I elected to increase losartan to 100 mg daily.  3.  Hyperlipidemia: Continue treatment with rosuvastatin.  Consider a target LDL of less than 70 given that she is diabetic.  Most recent lipid profile in 2018 showed an LDL of 83.  4. Sleep apnea: on CPAP  5. Morbid obesity: I discussed with him the importance of healthy lifestyle changes and resuming exercise.   Disposition:   FU with  me in 6 months  Signed,  COVID-19 Education: The signs and symptoms of COVID-19 were discussed with the patient and how to seek care for testing (follow up with PCP or arrange E-visit).  The importance of social distancing was discussed today.  Time:   Today, I have spent 12 minutes with the patient with telehealth technology discussing the above problems.     Medication Adjustments/Labs and Tests Ordered: Current medicines are reviewed at length with the patient today.  Concerns regarding medicines are outlined above.   Tests Ordered: No orders of the defined types were placed in this encounter.   Medication Changes: No orders of the defined types were placed in this encounter.   Disposition:  Follow up in 6 month(s)  Signed, Kathlyn Sacramento, MD  12/16/2018 10:00 AM    [place]

## 2019-01-03 ENCOUNTER — Other Ambulatory Visit: Payer: Self-pay | Admitting: Cardiovascular Disease

## 2019-01-03 NOTE — Telephone Encounter (Signed)
°*  STAT* If patient is at the pharmacy, call can be transferred to refill team.   1. Which medications need to be refilled? (please list name of each medication and dose if known) diltiazem 360 MG   2. Which pharmacy/location (including street and city if local pharmacy) is medication to be sent to? [facility] in [place]   3. Do they need a 30 day or 90 day supply? 90 day

## 2019-02-02 ENCOUNTER — Ambulatory Visit: Payer: Self-pay

## 2019-02-07 ENCOUNTER — Ambulatory Visit: Payer: Self-pay | Admitting: Internal Medicine

## 2019-02-15 ENCOUNTER — Telehealth: Payer: Self-pay

## 2019-02-15 NOTE — Telephone Encounter (Signed)
Confirmed and Screened patient in office for cpap.

## 2019-02-16 ENCOUNTER — Other Ambulatory Visit: Payer: Self-pay

## 2019-02-16 ENCOUNTER — Ambulatory Visit (INDEPENDENT_AMBULATORY_CARE_PROVIDER_SITE_OTHER): Payer: Medicare HMO

## 2019-02-16 DIAGNOSIS — G4733 Obstructive sleep apnea (adult) (pediatric): Secondary | ICD-10-CM | POA: Diagnosis not present

## 2019-02-16 NOTE — Progress Notes (Signed)
95 percentile pressure 8   95th percentile leak 43.1   apnea index 3.5 /hr  apnea-hypopnea index  4.2 /hr   total days used  >4 hr 88 days  total days used <4 hr 1 days  Total compliance 98 percent  He is doing very good leak goes up on days he lets facial hair grow out. No problems at this time

## 2019-02-28 ENCOUNTER — Ambulatory Visit: Payer: Medicare HMO | Admitting: Internal Medicine

## 2019-02-28 ENCOUNTER — Encounter: Payer: Self-pay | Admitting: Internal Medicine

## 2019-02-28 ENCOUNTER — Other Ambulatory Visit: Payer: Self-pay

## 2019-02-28 VITALS — BP 152/81 | HR 71 | Temp 96.3°F | Resp 16 | Ht 67.0 in | Wt 278.0 lb

## 2019-02-28 DIAGNOSIS — G4733 Obstructive sleep apnea (adult) (pediatric): Secondary | ICD-10-CM | POA: Diagnosis not present

## 2019-02-28 DIAGNOSIS — Z9989 Dependence on other enabling machines and devices: Secondary | ICD-10-CM

## 2019-02-28 DIAGNOSIS — C8590 Non-Hodgkin lymphoma, unspecified, unspecified site: Secondary | ICD-10-CM | POA: Diagnosis not present

## 2019-02-28 DIAGNOSIS — K219 Gastro-esophageal reflux disease without esophagitis: Secondary | ICD-10-CM

## 2019-02-28 DIAGNOSIS — I4891 Unspecified atrial fibrillation: Secondary | ICD-10-CM

## 2019-02-28 NOTE — Progress Notes (Signed)
[facility] [place], [place] [address]  Pulmonary Sleep Medicine   Office Visit Note  Patient Name: Mason Houston DOB: 09-19-1950 MRN [id number]  Date of Service: 02/28/2019  Complaints/HPI: Pt is seen today for yearly pulmonary follow up. He has a history of OSA.  He has been wearing Cpap. Pt reports good compliance with CPAP therapy. Cleaning machine by hand, using soclean machine, and changing filters and tubing as directed. Denies headaches, sinus issues, palpitations, or hemoptysis.       ROS  General: (-) fever, (-) chills, (-) night sweats, (-) weakness Skin: (-) rashes, (-) itching,. Eyes: (-) visual changes, (-) redness, (-) itching. Nose and Sinuses: (-) nasal stuffiness or itchiness, (-) postnasal drip, (-) nosebleeds, (-) sinus trouble. Mouth and Throat: (-) sore throat, (-) hoarseness. Neck: (-) swollen glands, (-) enlarged thyroid, (-) neck pain. Respiratory: - cough, (-) bloody sputum, - shortness of breath, - wheezing. Cardiovascular: - ankle swelling, (-) chest pain. Lymphatic: (-) lymph node enlargement. Neurologic: (-) numbness, (-) tingling. Psychiatric: (-) anxiety, (-) depression   Current Medication: Outpatient Encounter Medications as of 02/28/2019  Medication Sig  . acetaminophen (TYLENOL) 650 MG CR tablet Take 1,300 mg by mouth daily.  . Ascorbic Acid (VITAMIN C) 1000 MG tablet Take 1,000 mg by mouth daily.  Marland Kitchen b complex vitamins capsule Take 1 capsule by mouth daily.   . cholecalciferol (VITAMIN D) 1000 units tablet Take 1,000 Units by mouth daily.  . ciclopirox (PENLAC) 8 % solution Apply topically at bedtime. Apply over nail and surrounding skin. Apply daily over previous coat. After seven (7) days, may remove with alcohol and continue cycle.  . diltiazem (TIAZAC) 360 MG 24 hr capsule Take 1 capsule by mouth once daily  . fluticasone (FLONASE) 50 MCG/ACT nasal spray Place 1 spray into both nostrils daily.  Marland Kitchen loratadine  (CLARITIN) 10 MG tablet Take 10 mg by mouth daily.  Marland Kitchen losartan (COZAAR) 100 MG tablet Take 1 tablet (100 mg total) by mouth daily.  . magnesium oxide (MAG-OX) 400 (241.3 Mg) MG tablet Take 250 mg daily by mouth.   . metFORMIN (GLUCOPHAGE) 1000 MG tablet Take 1,000 mg by mouth 2 (two) times daily.  Marland Kitchen omeprazole (PRILOSEC) 20 MG capsule Take 1 capsule (20 mg total) by mouth daily.  . rivaroxaban (XARELTO) 20 MG TABS tablet TAKE 1 TABLET BY MOUTH ONCE DAILY WITH SUPPER  . rosuvastatin (CRESTOR) 10 MG tablet TAKE 1 TABLET(10 MG) BY MOUTH DAILY   No facility-administered encounter medications on file as of 02/28/2019.    Surgical History: Past Surgical History:  Procedure Laterality Date  . COLONOSCOPY  2012   Dr Candace Cruise  . COLONOSCOPY WITH PROPOFOL N/A 09/09/2017   Procedure: COLONOSCOPY WITH PROPOFOL;  Surgeon: [place], Benay Pike, MD;  Location: [facility] ENDOSCOPY;  Service: Gastroenterology;  Laterality: N/A;  . COLONOSCOPY WITH PROPOFOL N/A 09/21/2018   Procedure: COLONOSCOPY WITH PROPOFOL;  Surgeon: Tol[place], Benay Pike, MD;  Location: [facility] ENDOSCOPY;  Service: Gastroenterology;  Laterality: N/A;  . CYST EXCISION    . ERCP N/A 12/29/2014   Procedure: ENDOSCOPIC RETROGRADE CHOLANGIOPANCREATOGRAPHY (ERCP);  Surgeon: Hulen Luster, MD;  Location: [facility] ENDOSCOPY;  Service: Endoscopy;  Laterality: N/A;  . ERCP N/A 02/21/2015   Procedure: ENDOSCOPIC RETROGRADE CHOLANGIOPANCREATOGRAPHY (ERCP);  Surgeon: Hulen Luster, MD;  Location: [facility] ENDOSCOPY;  Service: Gastroenterology;  Laterality: N/A;  . ERCP N/A 08/14/2015   Procedure: ENDOSCOPIC RETROGRADE CHOLANGIOPANCREATOGRAPHY (ERCP) Stent removal;  Surgeon: [name], MD;  Location: [facility]  ENDOSCOPY;  Service: Endoscopy;  Laterality: N/A;  . PERIPHERAL VASCULAR CATHETERIZATION N/A 01/31/2015   Procedure: Glori Luis Cath Insertion;  Surgeon: Katha Cabal, MD;  Location: [place] CV LAB;  Service: Cardiovascular;  Laterality: N/A;  . PERIPHERAL VASCULAR CATHETERIZATION  N/A 12/18/2015   Procedure: Glori Luis Cath Removal;  Surgeon: Katha Cabal, MD;  Location: [place] CV LAB;  Service: Cardiovascular;  Laterality: N/A;  . TRIGGER FINGER RELEASE      Medical History: Past Medical History:  Diagnosis Date  . Atrial fibrillation ([place])   . Cancer ([place])   . Collagen vascular disease ([place])   . Depression   . Diabetes mellitus without complication ([place])   . Dysrhythmia   . Fibromyalgia   . GERD (gastroesophageal reflux disease)   . H/O Bell's palsy   . History of [place] measles   . Hypertension   . Mini stroke ([place])   . Non Hodgkin's lymphoma ([place]) 2017  . Osteoarthrosis, unspecified whether generalized or localized, lower leg   . Sixth cranial nerve palsy   . Sleep apnea   . Stroke [facility])     Family History: Family History  Problem Relation Age of Onset  . Hypertension Mother   . Heart attack Father   . Stroke Father     Social History: Social History   Socioeconomic History  . Marital status: Married    Spouse name: Not on file  . Number of children: Not on file  . Years of education: Not on file  . Highest education level: Not on file  Occupational History  . Not on file  Tobacco Use  . Smoking status: Former Smoker    Packs/day: 4.50    Years: 15.00    Pack years: 67.50    Types: Cigarettes  . Smokeless tobacco: Never Used  Substance and Sexual Activity  . Alcohol use: No  . Drug use: No    Types: Marijuana    Comment: PAST  . Sexual activity: Not on file  Other Topics Concern  . Not on file  Social History Narrative  . Not on file   Social Determinants of Health   Financial Resource Strain:   . Difficulty of Paying Living Expenses: Not on file  Food Insecurity:   . Worried About Charity fundraiser in the Last Year: Not on file  . Ran Out of Food in the Last Year: Not on file  Transportation Needs:   . Lack of Transportation (Medical): Not on file  . Lack of Transportation (Non-Medical): Not on file   Physical Activity:   . Days of Exercise per Week: Not on file  . Minutes of Exercise per Session: Not on file  Stress:   . Feeling of Stress : Not on file  Social Connections:   . Frequency of Communication with Friends and Family: Not on file  . Frequency of Social Gatherings with Friends and Family: Not on file  . Attends Religious Services: Not on file  . Active Member of Clubs or Organizations: Not on file  . Attends Archivist Meetings: Not on file  . Marital Status: Not on file  Intimate Partner Violence:   . Fear of Current or Ex-Partner: Not on file  . Emotionally Abused: Not on file  . Physically Abused: Not on file  . Sexually Abused: Not on file    Vital Signs: Blood pressure (!) 152/81, pulse 71, temperature (!) 96.3 F (35.7 C), resp. rate 16, height 5\' 7"  (1.702  m), weight 278 lb (126.1 kg), SpO2 95 %.  Examination: General Appearance: The patient is well-developed, well-nourished, and in no distress. Skin: Gross inspection of skin unremarkable. Head: normocephalic, no gross deformities. Eyes: no gross deformities noted. ENT: ears appear grossly normal no exudates. Neck: Supple. No thyromegaly. No LAD. Respiratory: clear bilaterally. Cardiovascular: Normal S1 and S2 without murmur or rub. Extremities: No cyanosis. pulses are equal. Neurologic: Alert and oriented. No involuntary movements.  LABS: Recent Results (from the past 2160 hour(s))  Lactate dehydrogenase     Status: None   Collection Time: 12/09/18  2:25 PM  Result Value Ref Range   LDH 127 98 - 192 U/L    Comment: Performed at [facility], [address] [place], [place] [address]  Comprehensive metabolic panel     Status: Abnormal   Collection Time: 12/09/18  2:25 PM  Result Value Ref Range   Sodium 138 135 - 145 mmol/L   Potassium 4.7 3.5 - 5.1 mmol/L   Chloride 102 98 - 111 mmol/L   CO2 26 22 - 32 mmol/L   Glucose, Bld 139 (H) 70 - 99 mg/dL   BUN 19 8 - 23 mg/dL    Creatinine, Ser 1.05 0.61 - 1.24 mg/dL   Calcium 9.2 8.9 - 10.3 mg/dL   Total Protein 7.5 6.5 - 8.1 g/dL   Albumin 4.2 3.5 - 5.0 g/dL   AST 27 15 - 41 U/L   ALT 26 0 - 44 U/L   Alkaline Phosphatase 68 38 - 126 U/L   Total Bilirubin 0.6 0.3 - 1.2 mg/dL   GFR calc non Af Amer >60 >60 mL/min   GFR calc Af Amer >60 >60 mL/min   Anion gap 10 5 - 15    Comment: Performed at [facility], [address] [place], [place] [address]  CBC with Differential/Platelet     Status: None   Collection Time: 12/09/18  2:25 PM  Result Value Ref Range   WBC 7.5 4.0 - 10.5 K/uL   RBC 5.04 4.22 - 5.81 MIL/uL   Hemoglobin 13.7 13.0 - 17.0 g/dL   HCT 43.5 39.0 - 52.0 %   MCV 86.3 80.0 - 100.0 fL   MCH 27.2 26.0 - 34.0 pg   MCHC 31.5 30.0 - 36.0 g/dL   RDW 14.2 11.5 - 15.5 %   Platelets 348 150 - 400 K/uL   nRBC 0.0 0.0 - 0.2 %   Neutrophils Relative % 64 %   Neutro Abs 4.8 1.7 - 7.7 K/uL   Lymphocytes Relative 21 %   Lymphs Abs 1.5 0.7 - 4.0 K/uL   Monocytes Relative 11 %   Monocytes Absolute 0.8 0.1 - 1.0 K/uL   Eosinophils Relative 3 %   Eosinophils Absolute 0.2 0.0 - 0.5 K/uL   Basophils Relative 1 %   Basophils Absolute 0.1 0.0 - 0.1 K/uL   Immature Granulocytes 0 %   Abs Immature Granulocytes 0.03 0.00 - 0.07 K/uL    Comment: Performed at [facility], [address], [place], [place] [address]    Radiology: No results found.  No results found.  No results found.    Assessment and Plan: Patient Active Problem List   Diagnosis Date Noted  . Abdominal abscess 12/13/2015  . 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 Localized skin mass, lump, or swelling 11/09/2015  . Internal nasal lesion 10/30/2015  . Fitting and adjustment of gastrointestinal appliance and device   .  Disease of biliary tract   . History of stroke 07/25/2015  . Annual physical exam 07/03/2015  . Pneumonia 05/15/2015  . Fever 05/15/2015  . Hypoxia 05/07/2015   . Pancreatic mass 01/12/2015  . Obstructive jaundice 12/28/2014  . Arthritis 06/09/2014  . Benign essential HTN 10/04/2013  . Cerebellar infarction ([place]) 10/04/2013  . Acid reflux 10/04/2013  . Arthritis, degenerative 10/04/2013  . HLD (hyperlipidemia) 10/04/2013  . H/O disease 10/04/2013  . Apnea, sleep 10/04/2013  . Type 2 diabetes mellitus ([place]) 10/04/2013  . Stroke ([place]) 09/08/2013  . Obstructive sleep apnea 02/03/2013  . Depression 07/05/2012  . Major depressive disorder with single episode 07/05/2012  . Hyperlipidemia LDL goal <100 12/23/2011  . Atrial fibrillation ([place])   . Hypertension     1. OSA on CPAP Continue to use cpap nightly as directed.   2. Morbid obesity ([place]) Obesity Counseling: Risk Assessment: An assessment of behavioral risk factors was made today and includes lack of exercise sedentary lifestyle, lack of portion control and poor dietary habits.  Risk Modification Advice: She was counseled on portion control guidelines. Restricting daily caloric intake to 1800. The detrimental long term effects of obesity on her health and ongoing poor compliance was also discussed with the patient.  3. Gastroesophageal reflux disease without esophagitis Controlled, continue present management.   4. Non-Hodgkin's lymphoma, unspecified body region, unspecified non-Hodgkin lymphoma type ([place]) In remission, continue to follow with oncology.   5. Atrial fibrillation, unspecified type ([place]) Stable, follow up with cardiology as scheduled.   General Counseling: I have discussed the findings of the evaluation and examination with Mason Houston.  I have also discussed any further diagnostic evaluation thatmay be needed or ordered today. Mason Houston verbalizes understanding of the findings of todays visit. We also reviewed his medications today and discussed drug interactions and side effects including but not limited excessive drowsiness and altered mental states. We also discussed that there is  always a risk not just to him but also people around him. he has been encouraged to call the office with any questions or concerns that should arise related to todays visit.  No orders of the defined types were placed in this encounter.    Time spent: 25 This patient was seen by Orson Gear AGNP-C in Collaboration with Dr. Devona Konig as a part of collaborative care agreement.   I have personally obtained a history, examined the patient, evaluated laboratory and imaging results, formulated the assessment and plan and placed orders.    Allyne Gee, MD [facility] Pulmonary and Critical Care Sleep medicine

## 2019-04-06 ENCOUNTER — Other Ambulatory Visit: Payer: Self-pay | Admitting: Cardiovascular Disease

## 2019-06-12 ENCOUNTER — Other Ambulatory Visit: Payer: Self-pay | Admitting: Internal Medicine

## 2019-06-14 ENCOUNTER — Ambulatory Visit: Payer: Medicare HMO | Admitting: Cardiovascular Disease

## 2019-06-14 ENCOUNTER — Other Ambulatory Visit: Payer: Self-pay

## 2019-06-14 ENCOUNTER — Encounter: Payer: Self-pay | Admitting: Cardiovascular Disease

## 2019-06-14 VITALS — BP 138/60 | HR 57 | Ht 67.0 in | Wt 282.0 lb

## 2019-06-14 DIAGNOSIS — E785 Hyperlipidemia, unspecified: Secondary | ICD-10-CM

## 2019-06-14 DIAGNOSIS — I482 Chronic atrial fibrillation, unspecified: Secondary | ICD-10-CM | POA: Diagnosis not present

## 2019-06-14 DIAGNOSIS — I10 Essential (primary) hypertension: Secondary | ICD-10-CM | POA: Diagnosis not present

## 2019-06-14 NOTE — Progress Notes (Signed)
Cardiology Office Note   Date:  06/14/2019   ID:  Mason Houston, DOB Oct 28, 1950, MRN [id number]  PCP:  Mason Hire, MD  Cardiologist:   Mason Sacramento, MD   Chief Complaint  Patient presents with  . OTHER    6 month f/u no complaints today. Meds reviewed verbally with pt.      History of Present Illness: Mason Houston is a 69 y.o. male who presents for a followup visit regarding chronic atrial fibrillation. The patient has a long history of  hypertension and obesity. He was diagnosed with atrial fibrillation with rapid ventricular response during a routine physical  in December of 2013 in the setting of heavy caffeine intake. He also has sleep apnea currently effectively treated with CPAP.  He had a stroke in August 2015.   He was diagnosed with non-Hodgkin's lymphoma in 2017 and was treated successfully with chemotherapy. Echocardiogram In July 2017 showed normal LV systolic function.   He is being treated with rate control for atrial fibrillation given lack of symptoms.  He has been doing well with no recent chest pain or palpitations.  He has stable exertional dyspnea mostly if he goes uphill.  No side effects with anticoagulation.     Past Medical History:  Diagnosis Date  . Atrial fibrillation ([place])   . Cancer ([place])   . Collagen vascular disease ([place])   . Depression   . Diabetes mellitus without complication ([place])   . Dysrhythmia   . Fibromyalgia   . GERD (gastroesophageal reflux disease)   . H/O Bell's palsy   . History of [place] measles   . Hypertension   . Mini stroke ([place])   . Non Hodgkin's lymphoma ([place]) 2017  . Osteoarthrosis, unspecified whether generalized or localized, lower leg   . Sixth cranial nerve palsy   . Sleep apnea   . Stroke [facility])     Past Surgical History:  Procedure Laterality Date  . COLONOSCOPY  2012   Dr Mason Houston  . COLONOSCOPY WITH PROPOFOL N/A 09/09/2017   Procedure: COLONOSCOPY WITH PROPOFOL;  Surgeon: Houston, Mason Pike,  MD;  Location: [facility] ENDOSCOPY;  Service: Gastroenterology;  Laterality: N/A;  . COLONOSCOPY WITH PROPOFOL N/A 09/21/2018   Procedure: COLONOSCOPY WITH PROPOFOL;  Surgeon: Houston, Mason Pike, MD;  Location: [facility] ENDOSCOPY;  Service: Gastroenterology;  Laterality: N/A;  . CYST EXCISION    . ERCP N/A 12/29/2014   Procedure: ENDOSCOPIC RETROGRADE CHOLANGIOPANCREATOGRAPHY (ERCP);  Surgeon: Mason Luster, MD;  Location: [facility] ENDOSCOPY;  Service: Endoscopy;  Laterality: N/A;  . ERCP N/A 02/21/2015   Procedure: ENDOSCOPIC RETROGRADE CHOLANGIOPANCREATOGRAPHY (ERCP);  Surgeon: Mason Luster, MD;  Location: [facility] ENDOSCOPY;  Service: Gastroenterology;  Laterality: N/A;  . ERCP N/A 08/14/2015   Procedure: ENDOSCOPIC RETROGRADE CHOLANGIOPANCREATOGRAPHY (ERCP) Stent removal;  Surgeon: Mason Lame, MD;  Location: [facility] ENDOSCOPY;  Service: Endoscopy;  Laterality: N/A;  . PERIPHERAL VASCULAR CATHETERIZATION N/A 01/31/2015   Procedure: Glori Luis Cath Insertion;  Surgeon: Mason Cabal, MD;  Location: [place] CV LAB;  Service: Cardiovascular;  Laterality: N/A;  . PERIPHERAL VASCULAR CATHETERIZATION N/A 12/18/2015   Procedure: Glori Luis Cath Removal;  Surgeon: Mason Cabal, MD;  Location: [place] CV LAB;  Service: Cardiovascular;  Laterality: N/A;  . TRIGGER FINGER RELEASE       Current Outpatient Medications  Medication Sig Dispense Refill  . acetaminophen (TYLENOL) 650 MG CR tablet Take 1,300 mg by mouth daily.    . Ascorbic Acid (VITAMIN C) 1000 MG tablet Take  1,000 mg by mouth daily.    Mason Houston b complex vitamins capsule Take 1 capsule by mouth daily.     . cholecalciferol (VITAMIN D) 1000 units tablet Take 1,000 Units by mouth daily.    Mason Houston diltiazem (TIAZAC) 360 MG 24 hr capsule Take 1 capsule by mouth once daily 90 capsule 0  . fluticasone (FLONASE) 50 MCG/ACT nasal spray Place 1 spray into both nostrils daily.    Mason Houston glipiZIDE (GLUCOTROL XL) 2.5 MG 24 hr tablet Take 2.5 mg by mouth daily with breakfast.    .  loratadine (CLARITIN) 10 MG tablet Take 10 mg by mouth daily as needed.     Mason Houston losartan (COZAAR) 100 MG tablet Take 1 tablet (100 mg total) by mouth daily. 90 tablet 3  . magnesium oxide (MAG-OX) 400 (241.3 Mg) MG tablet Take 250 mg daily by mouth.     . metFORMIN (GLUCOPHAGE) 1000 MG tablet Take 1,000 mg by mouth 2 (two) times daily.    Mason Houston omeprazole (PRILOSEC) 20 MG capsule Take 1 capsule by mouth once daily 90 capsule 0  . rivaroxaban (XARELTO) 20 MG TABS tablet TAKE 1 TABLET BY MOUTH ONCE DAILY WITH SUPPER 90 tablet 1  . rosuvastatin (CRESTOR) 10 MG tablet TAKE 1 TABLET(10 MG) BY MOUTH DAILY 90 tablet 0   No current facility-administered medications for this visit.    Allergies:   Celebrex [celecoxib], Other, and Penicillins    Social History:  The patient  reports that he has quit smoking. His smoking use included cigarettes. He has a 67.50 pack-year smoking history. He has never used smokeless tobacco. He reports that he does not drink alcohol or use drugs.   Family History:  The patient's family history includes Heart attack in his father; Hypertension in his mother; Stroke in his father.    ROS:  Please see the history of present illness.   Otherwise, review of systems are positive for none.   All other systems are reviewed and negative.    PHYSICAL EXAM: VS:  BP 138/60 (BP Location: Left Arm, Patient Position: Sitting, Cuff Size: Normal)   Pulse (!) 57   Ht 5\' 7"  (1.702 m)   Wt 282 lb (127.9 kg)   SpO2 98%   BMI 44.17 kg/m  , BMI Body mass index is 44.17 kg/m. GEN: Well nourished, well developed, in no acute distress  HEENT: normal  Neck: no JVD, carotid bruits, or masses Cardiac: Irregularly irregular; no murmurs, rubs, or gallops, trace leg edema  Respiratory:  clear to auscultation bilaterally, normal work of breathing GI: soft, nontender, nondistended, + BS MS: no deformity or atrophy  Skin: warm and dry, no rash Neuro:  Strength and sensation are intact Psych:  euthymic mood, full affect   EKG:  EKG is ordered today. The ekg ordered today demonstrates atrial fibrillation with right bundle branch block.  Ventricular rate is 57 bpm   Recent Labs: 12/09/2018: ALT 26; BUN 19; Creatinine, Ser 1.05; Hemoglobin 13.7; Platelets 348; Potassium 4.7; Sodium 138    Lipid Panel    Component Value Date/Time   CHOL 139 05/08/2016 1152   CHOL 181 07/03/2015 1007   TRIG 69 05/08/2016 1152   HDL 42 05/08/2016 1152   HDL 44 07/03/2015 1007   CHOLHDL 3.3 05/08/2016 1152   VLDL 14 05/08/2016 1152   LDLCALC 83 05/08/2016 1152   LDLCALC 122 (H) 07/03/2015 1007      Wt Readings from Last 3 Encounters:  06/14/19 282 lb (127.9 kg)  02/28/19 278 lb (126.1 kg)  12/16/18 270 lb (122.5 kg)        ASSESSMENT AND PLAN:   1.    Chronic atrial fibrillation:  Ventricular rate is controlled with diltiazem.  He is tolerating anticoagulation with Xarelto with no side effects.  I reviewed recent labs done with primary care physician which were unremarkable.  Ventricular rate is somewhat on the low side but he has no symptoms.  2. Essential hypertension: Blood pressure is now reasonably controlled since losartan was increased during last visit.  3.  Hyperlipidemia: Continue treatment with rosuvastatin.Most recent lipid profile in 10/2018 showed an LDL of 53.  4. Sleep apnea: on CPAP  5. Morbid obesity: I discussed with him the importance of healthy lifestyle changes and resuming exercise.  Disposition:   FU with me in 6 months  Signed,  Mason [place], MD  06/14/2019 3:47 PM    [place]

## 2019-06-14 NOTE — Patient Instructions (Signed)

## 2019-06-15 ENCOUNTER — Encounter: Payer: Self-pay | Admitting: Internal Medicine

## 2019-06-15 ENCOUNTER — Inpatient Hospital Stay: Payer: Medicare HMO | Admitting: Internal Medicine

## 2019-06-15 ENCOUNTER — Inpatient Hospital Stay: Payer: Medicare HMO | Attending: Internal Medicine

## 2019-06-15 DIAGNOSIS — Z79899 Other long term (current) drug therapy: Secondary | ICD-10-CM | POA: Insufficient documentation

## 2019-06-15 DIAGNOSIS — I480 Paroxysmal atrial fibrillation: Secondary | ICD-10-CM | POA: Insufficient documentation

## 2019-06-15 DIAGNOSIS — Z7901 Long term (current) use of anticoagulants: Secondary | ICD-10-CM | POA: Diagnosis not present

## 2019-06-15 DIAGNOSIS — C8338 Diffuse large B-cell lymphoma, lymph nodes of multiple sites: Secondary | ICD-10-CM | POA: Diagnosis present

## 2019-06-15 DIAGNOSIS — C8333 Diffuse large B-cell lymphoma, intra-abdominal lymph nodes: Secondary | ICD-10-CM | POA: Diagnosis not present

## 2019-06-15 DIAGNOSIS — E119 Type 2 diabetes mellitus without complications: Secondary | ICD-10-CM | POA: Diagnosis not present

## 2019-06-15 LAB — CBC WITH DIFFERENTIAL/PLATELET
Abs Immature Granulocytes: 0.02 10*3/uL (ref 0.00–0.07)
Basophils Absolute: 0.1 10*3/uL (ref 0.0–0.1)
Basophils Relative: 1 %
Eosinophils Absolute: 0.3 10*3/uL (ref 0.0–0.5)
Eosinophils Relative: 4 %
HCT: 42.1 % (ref 39.0–52.0)
Hemoglobin: 13.8 g/dL (ref 13.0–17.0)
Immature Granulocytes: 0 %
Lymphocytes Relative: 21 %
Lymphs Abs: 1.6 10*3/uL (ref 0.7–4.0)
MCH: 28 pg (ref 26.0–34.0)
MCHC: 32.8 g/dL (ref 30.0–36.0)
MCV: 85.6 fL (ref 80.0–100.0)
Monocytes Absolute: 0.8 10*3/uL (ref 0.1–1.0)
Monocytes Relative: 11 %
Neutro Abs: 4.6 10*3/uL (ref 1.7–7.7)
Neutrophils Relative %: 63 %
Platelets: 332 10*3/uL (ref 150–400)
RBC: 4.92 MIL/uL (ref 4.22–5.81)
RDW: 13.5 % (ref 11.5–15.5)
WBC: 7.3 10*3/uL (ref 4.0–10.5)
nRBC: 0 % (ref 0.0–0.2)

## 2019-06-15 LAB — COMPREHENSIVE METABOLIC PANEL
ALT: 23 U/L (ref 0–44)
AST: 23 U/L (ref 15–41)
Albumin: 4.1 g/dL (ref 3.5–5.0)
Alkaline Phosphatase: 69 U/L (ref 38–126)
Anion gap: 11 (ref 5–15)
BUN: 24 mg/dL — ABNORMAL HIGH (ref 8–23)
CO2: 28 mmol/L (ref 22–32)
Calcium: 9.2 mg/dL (ref 8.9–10.3)
Chloride: 103 mmol/L (ref 98–111)
Creatinine, Ser: 1.18 mg/dL (ref 0.61–1.24)
GFR calc Af Amer: >60 mL/min (ref >60)
GFR calc non Af Amer: >60 mL/min (ref >60)
Glucose, Bld: 144 mg/dL — ABNORMAL HIGH (ref 70–99)
Potassium: 5.4 mmol/L — ABNORMAL HIGH (ref 3.5–5.1)
Sodium: 142 mmol/L (ref 135–145)
Total Bilirubin: 0.8 mg/dL (ref 0.3–1.2)
Total Protein: 7.3 g/dL (ref 6.5–8.1)

## 2019-06-15 LAB — LACTATE DEHYDROGENASE: LDH: 133 U/L (ref 98–192)

## 2019-06-15 NOTE — Assessment & Plan Note (Addendum)
#   DLBCL of pancreatic head-  Stage IE. S/p R-CHOP [finished 2017 April].  Stable.  Currently no evidence of disease; monitor clinically.   # DM- FBS- 144; continue metformin/Glucotrol; close monitoring.  STABLE.   # Paroxysmal A.fib- on xarelto-[Dr.Arida] STABLE.    # MIld hyperkalemia- 5.4; monitor for now. Recommend low K diet.   # weight gain- not worse; recommend exercise/physical activity; discussed re: semaglutide; FDA approval. Recommend talking to PCP.   # DISPOSITION:  # Follow up in 6 months/-MD-labs-cbc/cmp/dh-Dr.B.

## 2019-06-15 NOTE — Progress Notes (Signed)
[place] OFFICE PROGRESS NOTE  Patient Care Team: Baxter Hire, MD as PCP - General (Internal Medicine) Wellington Hampshire, MD as PCP - Cardiology (Cardiology) [name], MD (Infectious Diseases) Wellington Hampshire, MD as Consulting Physician (Cardiology) Bary Castilla Forest Gleason, MD (General Surgery) Roselee Nova, MD as Referring Physician [facility] Medicine)  Cancer Staging No matching staging information was found for the patient.   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 Toledo]  --------------------------------------------------------   DIAGNOSIS: [Jan 2017 ]- DLBCL STAGE: IE  ;GOALS: CURATIVE CURRENT/MOST RECENT THERAPY [finished April 2017- R-CHOP X5]-surveillance    Diffuse large b-cell lymphoma, intra-abdominal lymph nodes ([place])      INTERVAL HISTORY:  Mason Houston 69 y.o.  male pleasant patient above history of diffuse large B-cell lymphoma is here for follow-up.  Patient denies any nausea vomiting abdominal pain.  Patient is not losing any weight.  In fact gaining weight.  He is concerned about his weight gain/overall general/cardiac health.  Review of Systems  Constitutional: Negative for chills, diaphoresis, fever, malaise/fatigue and weight loss.  HENT: Negative for nosebleeds and sore throat.   Eyes: Negative for double vision.  Respiratory: Negative for cough, hemoptysis, sputum  production, shortness of breath and wheezing.   Cardiovascular: Negative for chest pain, palpitations, orthopnea and leg swelling.  Gastrointestinal: Negative for abdominal pain, blood in stool, constipation, diarrhea, heartburn, melena, nausea and vomiting.  Genitourinary: Negative for dysuria, frequency and urgency.  Musculoskeletal: Negative for back pain and joint pain.  Skin: Negative.  Negative for itching and rash.  Neurological: Negative for tingling, focal weakness, weakness and headaches.  Endo/Heme/Allergies: Does not bruise/bleed easily.  Psychiatric/Behavioral: Negative for depression. The patient is nervous/anxious. The patient does not have insomnia.       PAST MEDICAL HISTORY :  Past Medical History:  Diagnosis Date   Atrial fibrillation ([place])    Cancer ([place])    Collagen vascular disease ([place])    Depression    Diabetes mellitus without complication ([place])    Dysrhythmia    Fibromyalgia    GERD (gastroesophageal reflux disease)    H/O Bell's palsy    History of German measles    Hypertension    Mini stroke ([place])    Non Hodgkin's lymphoma ([place]) 2017   Osteoarthrosis, unspecified whether generalized or localized, lower leg    Sixth cranial nerve palsy    Sleep apnea    Stroke [facility])     PAST SURGICAL HISTORY :   Past Surgical History:  Procedure Laterality Date   COLONOSCOPY  2012   Dr Candace Cruise   COLONOSCOPY WITH PROPOFOL N/A 09/09/2017   Procedure: COLONOSCOPY WITH PROPOFOL;  Surgeon: Toledo, Benay Pike, MD;  Location: [facility] ENDOSCOPY;  Service: Gastroenterology;  Laterality: N/A;   COLONOSCOPY WITH PROPOFOL N/A 09/21/2018   Procedure: COLONOSCOPY WITH PROPOFOL;  Surgeon: Toledo, Benay Pike, MD;  Location: [facility] ENDOSCOPY;  Service: Gastroenterology;  Laterality: N/A;   CYST EXCISION  ERCP N/A 12/29/2014   Procedure: ENDOSCOPIC RETROGRADE CHOLANGIOPANCREATOGRAPHY (ERCP);  Surgeon: Hulen Luster, MD;  Location: [facility] ENDOSCOPY;  Service: Endoscopy;   Laterality: N/A;   ERCP N/A 02/21/2015   Procedure: ENDOSCOPIC RETROGRADE CHOLANGIOPANCREATOGRAPHY (ERCP);  Surgeon: Hulen Luster, MD;  Location: [facility] ENDOSCOPY;  Service: Gastroenterology;  Laterality: N/A;   ERCP N/A 08/14/2015   Procedure: ENDOSCOPIC RETROGRADE CHOLANGIOPANCREATOGRAPHY (ERCP) Stent removal;  Surgeon: [name], MD;  Location: [facility] ENDOSCOPY;  Service: Endoscopy;  Laterality: N/A;   PERIPHERAL VASCULAR CATHETERIZATION N/A 01/31/2015   Procedure: Glori Luis Cath Insertion;  Surgeon: Katha Cabal, MD;  Location: [place] CV LAB;  Service: Cardiovascular;  Laterality: N/A;   PERIPHERAL VASCULAR CATHETERIZATION N/A 12/18/2015   Procedure: Glori Luis Cath Removal;  Surgeon: Katha Cabal, MD;  Location: [place] CV LAB;  Service: Cardiovascular;  Laterality: N/A;   TRIGGER FINGER RELEASE      FAMILY HISTORY :   Family History  Problem Relation Age of Onset   Hypertension Mother    Heart attack Father    Stroke Father     SOCIAL HISTORY:   Social History   Tobacco Use   Smoking status: Former Smoker    Packs/day: 4.50    Years: 15.00    Pack years: 67.50    Types: Cigarettes   Smokeless tobacco: Never Used  Scientific laboratory technician Use: Never used  Substance Use Topics   Alcohol use: No   Drug use: No    Types: Marijuana    Comment: PAST    ALLERGIES:  is allergic to celebrex [celecoxib], other, and penicillins.  MEDICATIONS:  Current Outpatient Medications  Medication Sig Dispense Refill   acetaminophen (TYLENOL) 650 MG CR tablet Take 1,300 mg by mouth daily.     Ascorbic Acid (VITAMIN C) 1000 MG tablet Take 1,000 mg by mouth daily.     b complex vitamins capsule Take 1 capsule by mouth daily.      cholecalciferol (VITAMIN D) 1000 units tablet Take 1,000 Units by mouth daily.     diltiazem (TIAZAC) 360 MG 24 hr capsule Take 1 capsule by mouth once daily 90 capsule 0   fluticasone (FLONASE) 50 MCG/ACT nasal spray Place 1 spray into both  nostrils daily.     glipiZIDE (GLUCOTROL XL) 2.5 MG 24 hr tablet Take 2.5 mg by mouth daily with breakfast.     loratadine (CLARITIN) 10 MG tablet Take 10 mg by mouth daily as needed.      losartan (COZAAR) 100 MG tablet Take 1 tablet (100 mg total) by mouth daily. 90 tablet 3   magnesium oxide (MAG-OX) 400 (241.3 Mg) MG tablet Take 250 mg daily by mouth.      metFORMIN (GLUCOPHAGE) 1000 MG tablet Take 1,000 mg by mouth 2 (two) times daily.     omeprazole (PRILOSEC) 20 MG capsule Take 1 capsule by mouth once daily 90 capsule 0   rivaroxaban (XARELTO) 20 MG TABS tablet TAKE 1 TABLET BY MOUTH ONCE DAILY WITH SUPPER 90 tablet 1   rosuvastatin (CRESTOR) 10 MG tablet TAKE 1 TABLET(10 MG) BY MOUTH DAILY 90 tablet 0   No current facility-administered medications for this visit.    PHYSICAL EXAMINATION: ECOG PERFORMANCE STATUS: 0 - Asymptomatic  BP 134/80 (BP Location: Left Arm, Patient Position: Sitting, Cuff Size: Large)    Pulse 91    Temp (!) 96.9 F (36.1 C) (Tympanic)    Wt 282 lb (127.9 kg)    SpO2 100%  BMI 44.17 kg/m   Filed Weights   06/15/19 1016  Weight: 282 lb (127.9 kg)    Physical Exam  Constitutional: He is oriented to person, place, and time and well-developed, well-nourished, and in no distress.  HENT:  Head: Normocephalic and atraumatic.  Mouth/Throat: Oropharynx is clear and moist. No oropharyngeal exudate.  Eyes: Pupils are equal, round, and reactive to light.  Cardiovascular: Normal rate and regular rhythm.  Pulmonary/Chest: No respiratory distress. He has no wheezes.  Abdominal: Soft. Bowel sounds are normal. He exhibits no distension and no mass. There is no abdominal tenderness. There is no rebound and no guarding.  Musculoskeletal:        General: No tenderness or edema. Normal range of motion.     Cervical back: Normal range of motion and neck supple.  Neurological: He is alert and oriented to person, place, and time.  Skin: Skin is warm.   Psychiatric: Affect normal.       LABORATORY DATA:  I have reviewed the data as listed    Component Value Date/Time   NA 142 06/15/2019 0948   NA 142 05/15/2015 1156   NA 139 12/19/2011 1044   K 5.4 (H) 06/15/2019 0948   K 4.1 12/19/2011 1044   CL 103 06/15/2019 0948   CL 106 12/19/2011 1044   CO2 28 06/15/2019 0948   CO2 26 12/19/2011 1044   GLUCOSE 144 (H) 06/15/2019 0948   GLUCOSE 95 12/19/2011 1044   BUN 24 (H) 06/15/2019 0948   BUN 16 05/15/2015 1156   BUN 18 12/19/2011 1044   CREATININE 1.18 06/15/2019 0948   CREATININE 0.88 05/08/2016 1152   CALCIUM 9.2 06/15/2019 0948   CALCIUM 9.0 12/19/2011 1044   PROT 7.3 06/15/2019 0948   PROT 6.1 05/15/2015 1156   PROT 9.0 (H) 12/19/2011 1044   ALBUMIN 4.1 06/15/2019 0948   ALBUMIN 3.7 05/15/2015 1156   ALBUMIN 4.4 12/19/2011 1044   AST 23 06/15/2019 0948   AST 44 (H) 12/19/2011 1044   ALT 23 06/15/2019 0948   ALT 46 12/19/2011 1044   ALKPHOS 69 06/15/2019 0948   ALKPHOS 110 12/19/2011 1044   BILITOT 0.8 06/15/2019 0948   BILITOT 0.2 05/15/2015 1156   BILITOT 0.7 12/19/2011 1044   GFRNONAA >60 06/15/2019 0948   GFRNONAA >89 05/08/2016 1152   GFRAA >60 06/15/2019 0948   GFRAA >89 05/08/2016 1152    No results found for: SPEP, UPEP  Lab Results  Component Value Date   WBC 7.3 06/15/2019   NEUTROABS 4.6 06/15/2019   HGB 13.8 06/15/2019   HCT 42.1 06/15/2019   MCV 85.6 06/15/2019   PLT 332 06/15/2019      Chemistry      Component Value Date/Time   NA 142 06/15/2019 0948   NA 142 05/15/2015 1156   NA 139 12/19/2011 1044   K 5.4 (H) 06/15/2019 0948   K 4.1 12/19/2011 1044   CL 103 06/15/2019 0948   CL 106 12/19/2011 1044   CO2 28 06/15/2019 0948   CO2 26 12/19/2011 1044   BUN 24 (H) 06/15/2019 0948   BUN 16 05/15/2015 1156   BUN 18 12/19/2011 1044   CREATININE 1.18 06/15/2019 0948   CREATININE 0.88 05/08/2016 1152      Component Value Date/Time   CALCIUM 9.2 06/15/2019 0948   CALCIUM 9.0  12/19/2011 1044   ALKPHOS 69 06/15/2019 0948   ALKPHOS 110 12/19/2011 1044   AST 23 06/15/2019 0948   AST 44 (H)  12/19/2011 1044   ALT 23 06/15/2019 0948   ALT 46 12/19/2011 1044   BILITOT 0.8 06/15/2019 0948   BILITOT 0.2 05/15/2015 1156   BILITOT 0.7 12/19/2011 1044       RADIOGRAPHIC STUDIES: I have personally reviewed the radiological images as listed and agreed with the findings in the report. No results found.   ASSESSMENT & PLAN:  Diffuse large b-cell lymphoma, intra-abdominal lymph nodes (H[facility]) # DLBCL of pancreatic head-  Stage IE. S/p R-CHOP [finished 2017 April].  Stable.  Currently no evidence of disease; monitor clinically.   # DM- FBS- 144; continue metformin/Glucotrol; close monitoring.  STABLE.   # Paroxysmal A.fib- on xarelto-[Dr.Arida] STABLE.    # MIld hyperkalemia- 5.4; monitor for now. Recommend low K diet.   # weight gain- not worse; recommend exercise/physical activity; discussed re: semaglutide; FDA approval. Recommend talking to PCP.   # DISPOSITION:  # Follow up in 6 months/-MD-labs-cbc/cmp/dh-Dr.B.     Orders Placed This Encounter  Procedures   CBC with Differential    Standing Status:   Future    Standing Expiration Date:   06/14/2020   Comprehensive metabolic panel    Standing Status:   Future    Standing Expiration Date:   06/14/2020   Lactate dehydrogenase    Standing Status:   Future    Standing Expiration Date:   06/14/2020   All questions were answered. The patient knows to call the clinic with any problems, questions or concerns.      Cammie Sickle, MD 06/16/2019 5:59 PM

## 2019-07-06 ENCOUNTER — Other Ambulatory Visit: Payer: Self-pay | Admitting: Cardiovascular Disease

## 2019-07-06 NOTE — Telephone Encounter (Signed)
*  STAT* If patient is at the pharmacy, call can be transferred to refill team.   1. Which medications need to be refilled? (please list name of each medication and dose if known) diltiazem 360 MG  2. Which pharmacy/location (including street and city if local pharmacy) is medication to be sent to? [facility] in [place]   3. Do they need a 30 day or 90 day supply? 90 day   Patient will be out of medication in 2 days

## 2019-08-17 ENCOUNTER — Other Ambulatory Visit: Payer: Self-pay | Admitting: Cardiovascular Disease

## 2019-08-17 NOTE — Telephone Encounter (Signed)
Pt's age 69, wt 127.9 kg, SCr 1.18, CrCl 109.9, last ov w/ MA 06/14/19.

## 2019-08-17 NOTE — Telephone Encounter (Signed)
Refill Request.  

## 2019-09-16 ENCOUNTER — Other Ambulatory Visit: Payer: Self-pay | Admitting: Internal Medicine

## 2019-11-11 NOTE — Telephone Encounter (Signed)
Closing Encounters.

## 2019-12-14 ENCOUNTER — Inpatient Hospital Stay: Payer: Medicare HMO | Admitting: Internal Medicine

## 2019-12-14 ENCOUNTER — Inpatient Hospital Stay: Payer: Medicare HMO | Attending: Internal Medicine

## 2019-12-14 ENCOUNTER — Encounter: Payer: Self-pay | Admitting: Internal Medicine

## 2019-12-14 ENCOUNTER — Other Ambulatory Visit: Payer: Self-pay

## 2019-12-14 VITALS — BP 151/103 | HR 58 | Temp 96.7°F | Resp 18 | Wt 282.0 lb

## 2019-12-14 DIAGNOSIS — R319 Hematuria, unspecified: Secondary | ICD-10-CM

## 2019-12-14 DIAGNOSIS — C8338 Diffuse large B-cell lymphoma, lymph nodes of multiple sites: Secondary | ICD-10-CM | POA: Diagnosis present

## 2019-12-14 DIAGNOSIS — C8333 Diffuse large B-cell lymphoma, intra-abdominal lymph nodes: Secondary | ICD-10-CM

## 2019-12-14 DIAGNOSIS — E119 Type 2 diabetes mellitus without complications: Secondary | ICD-10-CM | POA: Insufficient documentation

## 2019-12-14 DIAGNOSIS — Z87891 Personal history of nicotine dependence: Secondary | ICD-10-CM | POA: Insufficient documentation

## 2019-12-14 DIAGNOSIS — Z7901 Long term (current) use of anticoagulants: Secondary | ICD-10-CM | POA: Insufficient documentation

## 2019-12-14 DIAGNOSIS — Z7984 Long term (current) use of oral hypoglycemic drugs: Secondary | ICD-10-CM | POA: Diagnosis not present

## 2019-12-14 DIAGNOSIS — I480 Paroxysmal atrial fibrillation: Secondary | ICD-10-CM | POA: Insufficient documentation

## 2019-12-14 LAB — URINALYSIS, COMPLETE (UACMP) WITH MICROSCOPIC
Bacteria, UA: NONE SEEN
Bilirubin Urine: NEGATIVE
Glucose, UA: NEGATIVE mg/dL
Hgb urine dipstick: NEGATIVE
Ketones, ur: NEGATIVE mg/dL
Nitrite: NEGATIVE
Protein, ur: NEGATIVE mg/dL
Specific Gravity, Urine: 1.012 (ref 1.005–1.030)
pH: 7 (ref 5.0–8.0)

## 2019-12-14 LAB — CBC WITH DIFFERENTIAL/PLATELET
Abs Immature Granulocytes: 0.02 10*3/uL (ref 0.00–0.07)
Basophils Absolute: 0.1 10*3/uL (ref 0.0–0.1)
Basophils Relative: 1 %
Eosinophils Absolute: 0.3 10*3/uL (ref 0.0–0.5)
Eosinophils Relative: 4 %
HCT: 44 % (ref 39.0–52.0)
Hemoglobin: 14.7 g/dL (ref 13.0–17.0)
Immature Granulocytes: 0 %
Lymphocytes Relative: 25 %
Lymphs Abs: 1.9 10*3/uL (ref 0.7–4.0)
MCH: 28.8 pg (ref 26.0–34.0)
MCHC: 33.4 g/dL (ref 30.0–36.0)
MCV: 86.1 fL (ref 80.0–100.0)
Monocytes Absolute: 1 10*3/uL (ref 0.1–1.0)
Monocytes Relative: 12 %
Neutro Abs: 4.6 10*3/uL (ref 1.7–7.7)
Neutrophils Relative %: 58 %
Platelets: 341 10*3/uL (ref 150–400)
RBC: 5.11 MIL/uL (ref 4.22–5.81)
RDW: 13.2 % (ref 11.5–15.5)
WBC: 7.9 10*3/uL (ref 4.0–10.5)
nRBC: 0 % (ref 0.0–0.2)

## 2019-12-14 LAB — COMPREHENSIVE METABOLIC PANEL
ALT: 25 U/L (ref 0–44)
AST: 26 U/L (ref 15–41)
Albumin: 4.5 g/dL (ref 3.5–5.0)
Alkaline Phosphatase: 67 U/L (ref 38–126)
Anion gap: 13 (ref 5–15)
BUN: 19 mg/dL (ref 8–23)
CO2: 27 mmol/L (ref 22–32)
Calcium: 9.7 mg/dL (ref 8.9–10.3)
Chloride: 97 mmol/L — ABNORMAL LOW (ref 98–111)
Creatinine, Ser: 1.24 mg/dL (ref 0.61–1.24)
GFR, Estimated: >60 mL/min (ref >60)
Glucose, Bld: 134 mg/dL — ABNORMAL HIGH (ref 70–99)
Potassium: 4.9 mmol/L (ref 3.5–5.1)
Sodium: 137 mmol/L (ref 135–145)
Total Bilirubin: 1.1 mg/dL (ref 0.3–1.2)
Total Protein: 7.9 g/dL (ref 6.5–8.1)

## 2019-12-14 LAB — LACTATE DEHYDROGENASE: LDH: 141 U/L (ref 98–192)

## 2019-12-14 NOTE — Assessment & Plan Note (Addendum)
#   DLBCL of pancreatic head-  Stage IE. S/p R-CHOP [finished 2017 April].  STABLE  Currently no evidence of disease; monitor clinically.   # DM- FBS- 134; continue metformin/Glucotrol; close monitoring- STABLE.   # Hematuria-intermittent- none recently;  ? On xarelto; check UA;   # Paroxysmal A.fib- on xarelto-[Dr.Arida] STABLE.    # BP- elevated in clinic; at home-stable.  Monitor closely.  # DISPOSITION:  # order UA # Follow up in 6 months/-MD-labs-cbc/cmp/dh-Dr.B.   Addendum UA negative for any bleeding.  Inform patient that if hematuria continues to happen-recommend evaluation with urology.  Defer to PCP for referral.

## 2019-12-14 NOTE — Progress Notes (Signed)
FYI

## 2019-12-14 NOTE — Progress Notes (Signed)
[place] OFFICE PROGRESS NOTE  Patient Care Team: Baxter Hire, MD as PCP - General (Internal Medicine) Wellington Hampshire, MD as PCP - Cardiology (Cardiology) [name], MD (Infectious Diseases) Wellington Hampshire, MD as Consulting Physician (Cardiology) Bary Castilla Forest Gleason, MD (General Surgery) Roselee Nova, MD as Referring Physician [facility])  Cancer Staging No matching staging information was found for the patient.   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 Toledo]  --------------------------------------------------------   DIAGNOSIS: [Jan 2017 ]- DLBCL STAGE: IE  ;GOALS: CURATIVE CURRENT/MOST RECENT THERAPY [finished April 2017- R-CHOP X5]-surveillance    Diffuse large b-cell lymphoma, intra-abdominal lymph nodes ([place])      INTERVAL HISTORY:  Mason Houston 69 y.o.  male pleasant patient above history of diffuse large B-cell lymphoma is here for follow-up.  Patient noted to have episode of blood in his urine x3.  Denies any burning pain or dysuria.  Denies any trauma.  Resolved by itself.  Patient is on Xarelto for A. fib.    Abdominal pain.  No nausea vomiting.  Review of Systems  Constitutional: Negative for chills, diaphoresis, fever, malaise/fatigue and weight loss.  HENT: Negative for nosebleeds and sore throat.   Eyes: Negative for double vision.  Respiratory: Negative  for cough, hemoptysis, sputum production, shortness of breath and wheezing.   Cardiovascular: Negative for chest pain, palpitations, orthopnea and leg swelling.  Gastrointestinal: Negative for abdominal pain, blood in stool, constipation, diarrhea, heartburn, melena, nausea and vomiting.  Genitourinary: Negative for dysuria, frequency and urgency.  Musculoskeletal: Negative for back pain and joint pain.  Skin: Negative.  Negative for itching and rash.  Neurological: Negative for tingling, focal weakness, weakness and headaches.  Endo/Heme/Allergies: Does not bruise/bleed easily.  Psychiatric/Behavioral: Negative for depression. The patient is nervous/anxious. The patient does not have insomnia.       PAST MEDICAL HISTORY :  Past Medical History:  Diagnosis Date  . Atrial fibrillation ([place])   . Cancer ([place])   . Collagen vascular disease ([place])   . Depression   . Diabetes mellitus without complication ([place])   . Dysrhythmia   . Fibromyalgia   . GERD (gastroesophageal reflux disease)   . H/O Bell's palsy   . History of [place] measles   . Hypertension   . Mini stroke ([place])   . Non Hodgkin's lymphoma ([place]) 2017  . Osteoarthrosis, unspecified whether generalized or localized, lower leg   . Sixth cranial nerve palsy   . Sleep apnea   . Stroke [facility])     PAST SURGICAL HISTORY :   Past Surgical History:  Procedure Laterality Date  . COLONOSCOPY  2012   Dr Candace Cruise  . COLONOSCOPY WITH PROPOFOL N/A 09/09/2017   Procedure: COLONOSCOPY WITH PROPOFOL;  Surgeon: Toledo, Benay Pike, MD;  Location: [facility] ENDOSCOPY;  Service: Gastroenterology;  Laterality: N/A;  . COLONOSCOPY WITH PROPOFOL N/A 09/21/2018   Procedure: COLONOSCOPY WITH PROPOFOL;  Surgeon: Alice Reichert, Benay Pike, MD;  Location: [facility] ENDOSCOPY;  Service: Gastroenterology;  Laterality: N/A;  . CYST EXCISION    . ERCP N/A 12/29/2014   Procedure: ENDOSCOPIC RETROGRADE CHOLANGIOPANCREATOGRAPHY (ERCP);  Surgeon: Hulen Luster, MD;  Location: [facility]  ENDOSCOPY;  Service: Endoscopy;  Laterality: N/A;  . ERCP N/A 02/21/2015   Procedure: ENDOSCOPIC RETROGRADE CHOLANGIOPANCREATOGRAPHY (ERCP);  Surgeon: Hulen Luster, MD;  Location: [facility] ENDOSCOPY;  Service: Gastroenterology;  Laterality: N/A;  . ERCP N/A 08/14/2015   Procedure: ENDOSCOPIC RETROGRADE CHOLANGIOPANCREATOGRAPHY (ERCP) Stent removal;  Surgeon: [name], MD;  Location: [facility] ENDOSCOPY;  Service: Endoscopy;  Laterality: N/A;  . PERIPHERAL VASCULAR CATHETERIZATION N/A 01/31/2015   Procedure: Glori Luis Cath Insertion;  Surgeon: Katha Cabal, MD;  Location: [place] CV LAB;  Service: Cardiovascular;  Laterality: N/A;  . PERIPHERAL VASCULAR CATHETERIZATION N/A 12/18/2015   Procedure: Glori Luis Cath Removal;  Surgeon: Katha Cabal, MD;  Location: [place] CV LAB;  Service: Cardiovascular;  Laterality: N/A;  . TRIGGER FINGER RELEASE      FAMILY HISTORY :   Family History  Problem Relation Age of Onset  . Hypertension Mother   . Heart attack Father   . Stroke Father     SOCIAL HISTORY:   Social History   Tobacco Use  . Smoking status: Former Smoker    Packs/day: 4.50    Years: 15.00    Pack years: 67.50    Types: Cigarettes  . Smokeless tobacco: Never Used  Vaping Use  . Vaping Use: Never used  Substance Use Topics  . Alcohol use: No  . Drug use: No    Types: Marijuana    Comment: PAST    ALLERGIES:  is allergic to celebrex [celecoxib], other, and penicillins.  MEDICATIONS:  Current Outpatient Medications  Medication Sig Dispense Refill  . sildenafil (VIAGRA) 50 MG tablet Take 1 tablet by mouth at bedtime as needed for erectile dysfunction.    Marland Kitchen acetaminophen (TYLENOL) 650 MG CR tablet Take 1,300 mg by mouth daily.    . Ascorbic Acid (VITAMIN C) 1000 MG tablet Take 1,000 mg by mouth daily.    Marland Kitchen b complex vitamins capsule Take 1 capsule by mouth daily.     . cholecalciferol (VITAMIN D) 1000 units tablet Take 1,000 Units by mouth daily.    Marland Kitchen diltiazem (TIAZAC)  360 MG 24 hr capsule Take 1 capsule by mouth once daily 90 capsule 3  . fluticasone (FLONASE) 50 MCG/ACT nasal spray Place 1 spray into both nostrils daily.    Marland Kitchen glipiZIDE (GLUCOTROL XL) 2.5 MG 24 hr tablet Take 2.5 mg by mouth daily with breakfast.    . loratadine (CLARITIN) 10 MG tablet Take 10 mg by mouth daily as needed.     Marland Kitchen losartan (COZAAR) 100 MG tablet Take 1 tablet (100 mg total) by mouth daily. 90 tablet 3  . magnesium oxide (MAG-OX) 400 (241.3 Mg) MG tablet Take 250 mg daily by mouth.     . metFORMIN (GLUCOPHAGE) 1000 MG tablet Take 1,000 mg by mouth 2 (two) times daily.    Marland Kitchen omeprazole (PRILOSEC) 20 MG capsule Take 1 capsule by mouth once daily 90 capsule 3  . rosuvastatin (CRESTOR) 10 MG tablet TAKE 1 TABLET(10 MG) BY MOUTH DAILY 90 tablet 0  . XARELTO 20 MG TABS tablet TAKE 1 TABLET BY MOUTH ONCE DAILY WITH SUPPER 90 tablet 1   No current facility-administered medications for this visit.    PHYSICAL EXAMINATION: ECOG PERFORMANCE STATUS: 0 - Asymptomatic  BP (!) 151/103 (BP Location:  Right Arm, Patient Position: Sitting)   Pulse (!) 58   Temp (!) 96.7 F (35.9 C) (Tympanic)   Resp 18   Wt 282 lb (127.9 kg)   SpO2 97%   BMI 44.17 kg/m   Filed Weights   12/14/19 1027  Weight: 282 lb (127.9 kg)    Physical Exam HENT:     Head: Normocephalic and atraumatic.     Mouth/Throat:     Pharynx: No oropharyngeal exudate.  Eyes:     Pupils: Pupils are equal, round, and reactive to light.  Cardiovascular:     Rate and Rhythm: Normal rate and regular rhythm.  Pulmonary:     Effort: No respiratory distress.     Breath sounds: No wheezing.  Abdominal:     General: Bowel sounds are normal. There is no distension.     Palpations: Abdomen is soft. There is no mass.     Tenderness: There is no abdominal tenderness. There is no guarding or rebound.  Musculoskeletal:        General: No tenderness. Normal range of motion.     Cervical back: Normal range of motion and neck  supple.  Skin:    General: Skin is warm.  Neurological:     Mental Status: He is alert and oriented to person, place, and time.  Psychiatric:        Mood and Affect: Affect normal.        LABORATORY DATA:  I have reviewed the data as listed    Component Value Date/Time   NA 137 12/14/2019 0937   NA 142 05/15/2015 1156   NA 139 12/19/2011 1044   K 4.9 12/14/2019 0937   K 4.1 12/19/2011 1044   CL 97 (L) 12/14/2019 0937   CL 106 12/19/2011 1044   CO2 27 12/14/2019 0937   CO2 26 12/19/2011 1044   GLUCOSE 134 (H) 12/14/2019 0937   GLUCOSE 95 12/19/2011 1044   BUN 19 12/14/2019 0937   BUN 16 05/15/2015 1156   BUN 18 12/19/2011 1044   CREATININE 1.24 12/14/2019 0937   CREATININE 0.88 05/08/2016 1152   CALCIUM 9.7 12/14/2019 0937   CALCIUM 9.0 12/19/2011 1044   PROT 7.9 12/14/2019 0937   PROT 6.1 05/15/2015 1156   PROT 9.0 (H) 12/19/2011 1044   ALBUMIN 4.5 12/14/2019 0937   ALBUMIN 3.7 05/15/2015 1156   ALBUMIN 4.4 12/19/2011 1044   AST 26 12/14/2019 0937   AST 44 (H) 12/19/2011 1044   ALT 25 12/14/2019 0937   ALT 46 12/19/2011 1044   ALKPHOS 67 12/14/2019 0937   ALKPHOS 110 12/19/2011 1044   BILITOT 1.1 12/14/2019 0937   BILITOT 0.2 05/15/2015 1156   BILITOT 0.7 12/19/2011 1044   GFRNONAA >60 12/14/2019 0937   GFRNONAA >89 05/08/2016 1152   GFRAA >60 06/15/2019 0948   GFRAA >89 05/08/2016 1152    No results found for: SPEP, UPEP  Lab Results  Component Value Date   WBC 7.9 12/14/2019   NEUTROABS 4.6 12/14/2019   HGB 14.7 12/14/2019   HCT 44.0 12/14/2019   MCV 86.1 12/14/2019   PLT 341 12/14/2019      Chemistry      Component Value Date/Time   NA 137 12/14/2019 0937   NA 142 05/15/2015 1156   NA 139 12/19/2011 1044   K 4.9 12/14/2019 0937   K 4.1 12/19/2011 1044   CL 97 (L) 12/14/2019 0937   CL 106 12/19/2011 1044   CO2 27 12/14/2019 3557  CO2 26 12/19/2011 1044   BUN 19 12/14/2019 0937   BUN 16 05/15/2015 1156   BUN 18 12/19/2011 1044    CREATININE 1.24 12/14/2019 0937   CREATININE 0.88 05/08/2016 1152      Component Value Date/Time   CALCIUM 9.7 12/14/2019 0937   CALCIUM 9.0 12/19/2011 1044   ALKPHOS 67 12/14/2019 0937   ALKPHOS 110 12/19/2011 1044   AST 26 12/14/2019 0937   AST 44 (H) 12/19/2011 1044   ALT 25 12/14/2019 0937   ALT 46 12/19/2011 1044   BILITOT 1.1 12/14/2019 0937   BILITOT 0.2 05/15/2015 1156   BILITOT 0.7 12/19/2011 1044       RADIOGRAPHIC STUDIES: I have personally reviewed the radiological images as listed and agreed with the findings in the report. No results found.   ASSESSMENT & PLAN:  Diffuse large b-cell lymphoma, intra-abdominal lymph nodes (HCC) # DLBCL of pancreatic head-  Stage IE. S/p R-CHOP [finished 2017 April].  STABLE  Currently no evidence of disease; monitor clinically.   # DM- FBS- 134; continue metformin/Glucotrol; close monitoring- STABLE.   # Hematuria-intermittent- none recently;  ? On xarelto; check UA;   # Paroxysmal A.fib- on xarelto-[Dr.Arida] STABLE.    # BP- elevated in clinic; at home-stable.  Monitor closely.  # DISPOSITION:  # order UA # Follow up in 6 months/-MD-labs-cbc/cmp/dh-Dr.B.   Addendum UA negative for any bleeding.  Inform patient that if hematuria continues to happen-recommend evaluation with urology.  Defer to PCP for referral.    Orders Placed This Encounter  Procedures  . Urinalysis, Complete w Microscopic    Standing Status:   Future    Number of Occurrences:   1    Standing Expiration Date:   12/13/2020  . CBC with Differential    Standing Status:   Future    Standing Expiration Date:   12/13/2020  . Comprehensive metabolic panel    Standing Status:   Future    Standing Expiration Date:   12/13/2020  . Lactate dehydrogenase    Standing Status:   Future    Standing Expiration Date:   12/13/2020   All questions were answered. The patient knows to call the clinic with any problems, questions or concerns.      Cammie Sickle, MD 12/14/2019 7:11 PM

## 2019-12-28 ENCOUNTER — Other Ambulatory Visit: Payer: Self-pay

## 2019-12-28 ENCOUNTER — Ambulatory Visit: Payer: Medicare HMO | Admitting: Cardiovascular Disease

## 2019-12-28 ENCOUNTER — Encounter: Payer: Self-pay | Admitting: Cardiovascular Disease

## 2019-12-28 VITALS — BP 150/78 | HR 67 | Ht 67.0 in | Wt 284.5 lb

## 2019-12-28 DIAGNOSIS — I10 Essential (primary) hypertension: Secondary | ICD-10-CM

## 2019-12-28 DIAGNOSIS — E782 Mixed hyperlipidemia: Secondary | ICD-10-CM | POA: Diagnosis not present

## 2019-12-28 DIAGNOSIS — G4730 Sleep apnea, unspecified: Secondary | ICD-10-CM | POA: Diagnosis not present

## 2019-12-28 DIAGNOSIS — I482 Chronic atrial fibrillation, unspecified: Secondary | ICD-10-CM | POA: Diagnosis not present

## 2019-12-28 NOTE — Progress Notes (Signed)
Cardiology Office Note   Date:  12/28/2019   ID:  Mason Houston, DOB 1950-07-13, MRN [id number]  PCP:  Baxter Hire, MD  Cardiologist:   Kathlyn Sacramento, MD   Chief Complaint  Patient presents with  . Follow-up    6 month. Patient c/o blood in urine in Oct. 2021 but has not noticed anymore since then.       History of Present Illness: Mason Houston is a 69 y.o. male who presents for a followup visit regarding chronic atrial fibrillation. The patient has a long history of  hypertension and obesity. He was diagnosed with atrial fibrillation with rapid ventricular response during a routine physical  in December of 2013 in the setting of heavy caffeine intake. He also has sleep apnea currently effectively treated with CPAP.  He had a stroke in August 2015.   He was diagnosed with non-Hodgkin's lymphoma in 2017 and was treated successfully with chemotherapy. Echocardiogram In July 2017 showed normal LV systolic function.   He is being treated with rate control for atrial fibrillation given lack of symptoms.  He has been doing reasonably well with no chest pain, shortness of breath or palpitations.  He reports having gross hematuria in November that was transient.  He reported this to Dr. Rogue Bussing and he had urinalysis performed which showed no evidence of hematuria. He reports being under significant stress especially with his living situation.  His stepson who is 37 year old lives with them and he is an alcoholic.     Past Medical History:  Diagnosis Date  . Atrial fibrillation ([place])   . Cancer ([place])   . Collagen vascular disease ([place])   . Depression   . Diabetes mellitus without complication ([employer])   . Dysrhythmia   . Fibromyalgia   . GERD (gastroesophageal reflux disease)   . H/O Bell's palsy   . History of [place] measles   . Hypertension   . Mini stroke ([place])   . Non Hodgkin's lymphoma ([place]) 2017  . Osteoarthrosis, unspecified whether generalized or  localized, lower leg   . Sixth cranial nerve palsy   . Sleep apnea   . Stroke [facility])     Past Surgical History:  Procedure Laterality Date  . COLONOSCOPY  2012   Dr Candace Cruise  . COLONOSCOPY WITH PROPOFOL N/A 09/09/2017   Procedure: COLONOSCOPY WITH PROPOFOL;  Surgeon: Toledo, Benay Pike, MD;  Location: [facility] ENDOSCOPY;  Service: Gastroenterology;  Laterality: N/A;  . COLONOSCOPY WITH PROPOFOL N/A 09/21/2018   Procedure: COLONOSCOPY WITH PROPOFOL;  Surgeon: Toledo, Benay Pike, MD;  Location: [facility] ENDOSCOPY;  Service: Gastroenterology;  Laterality: N/A;  . CYST EXCISION    . ERCP N/A 12/29/2014   Procedure: ENDOSCOPIC RETROGRADE CHOLANGIOPANCREATOGRAPHY (ERCP);  Surgeon: Hulen Luster, MD;  Location: [facility] ENDOSCOPY;  Service: Endoscopy;  Laterality: N/A;  . ERCP N/A 02/21/2015   Procedure: ENDOSCOPIC RETROGRADE CHOLANGIOPANCREATOGRAPHY (ERCP);  Surgeon: Hulen Luster, MD;  Location: [facility] ENDOSCOPY;  Service: Gastroenterology;  Laterality: N/A;  . ERCP N/A 08/14/2015   Procedure: ENDOSCOPIC RETROGRADE CHOLANGIOPANCREATOGRAPHY (ERCP) Stent removal;  Surgeon: [name], MD;  Location: [facility] ENDOSCOPY;  Service: Endoscopy;  Laterality: N/A;  . PERIPHERAL VASCULAR CATHETERIZATION N/A 01/31/2015   Procedure: Glori Luis Cath Insertion;  Surgeon: Katha Cabal, MD;  Location: [place] CV LAB;  Service: Cardiovascular;  Laterality: N/A;  . PERIPHERAL VASCULAR CATHETERIZATION N/A 12/18/2015   Procedure: Glori Luis Cath Removal;  Surgeon: Katha Cabal, MD;  Location: [place] CV LAB;  Service: Cardiovascular;  Laterality: N/A;  . TRIGGER FINGER RELEASE       Current Outpatient Medications  Medication Sig Dispense Refill  . acetaminophen (TYLENOL) 650 MG CR tablet Take 1,300 mg by mouth daily.    . Ascorbic Acid (VITAMIN C) 1000 MG tablet Take 1,000 mg by mouth daily.    Marland Kitchen b complex vitamins capsule Take 1 capsule by mouth daily.     . cholecalciferol (VITAMIN D) 1000 units tablet Take 1,000 Units by mouth  daily.    Marland Kitchen diltiazem (TIAZAC) 360 MG 24 hr capsule Take 1 capsule by mouth once daily 90 capsule 3  . glipiZIDE (GLUCOTROL XL) 2.5 MG 24 hr tablet Take 2.5 mg by mouth daily with breakfast.    . loratadine (CLARITIN) 10 MG tablet Take 10 mg by mouth daily as needed.     Marland Kitchen losartan (COZAAR) 100 MG tablet Take 1 tablet (100 mg total) by mouth daily. 90 tablet 3  . magnesium oxide (MAG-OX) 400 (241.3 Mg) MG tablet Take 250 mg daily by mouth.     . metFORMIN (GLUCOPHAGE) 1000 MG tablet Take 1,000 mg by mouth 2 (two) times daily.    Marland Kitchen omeprazole (PRILOSEC) 20 MG capsule Take 1 capsule by mouth once daily 90 capsule 3  . rosuvastatin (CRESTOR) 10 MG tablet TAKE 1 TABLET(10 MG) BY MOUTH DAILY 90 tablet 0  . XARELTO 20 MG TABS tablet TAKE 1 TABLET BY MOUTH ONCE DAILY WITH SUPPER 90 tablet 1   No current facility-administered medications for this visit.    Allergies:   Celebrex [celecoxib], Other, and Penicillins    Social History:  The patient  reports that he has quit smoking. His smoking use included cigarettes. He has a 67.50 pack-year smoking history. He has never used smokeless tobacco. He reports that he does not drink alcohol and does not use drugs.   Family History:  The patient's family history includes Heart attack in his father; Hypertension in his mother; Stroke in his father.    ROS:  Please see the history of present illness.   Otherwise, review of systems are positive for none.   All other systems are reviewed and negative.    PHYSICAL EXAM: VS:  BP (!) 150/78 (BP Location: Left Arm, Patient Position: Sitting, Cuff Size: Large)   Pulse 67   Ht 5\' 7"  (1.702 m)   Wt 284 lb 8 oz (129 kg)   SpO2 98%   BMI 44.56 kg/m  , BMI Body mass index is 44.56 kg/m. GEN: Well nourished, well developed, in no acute distress  HEENT: normal  Neck: no JVD, carotid bruits, or masses Cardiac: Irregularly irregular; no murmurs, rubs, or gallops, trace leg edema  Respiratory:  clear to  auscultation bilaterally, normal work of breathing GI: soft, nontender, nondistended, + BS MS: no deformity or atrophy  Skin: warm and dry, no rash Neuro:  Strength and sensation are intact Psych: euthymic mood, full affect   EKG:  EKG is ordered today. The ekg ordered today demonstrates atrial fibrillation with right bundle branch block.  Ventricular rate is 67 bpm   Recent Labs: 12/14/2019: ALT 25; BUN 19; Creatinine, Ser 1.24; Hemoglobin 14.7; Platelets 341; Potassium 4.9; Sodium 137    Lipid Panel    Component Value Date/Time   CHOL 139 05/08/2016 1152   CHOL 181 07/03/2015 1007   TRIG 69 05/08/2016 1152   HDL 42 05/08/2016 1152   HDL 44 07/03/2015 1007   CHOLHDL 3.3 05/08/2016 1152   VLDL 14  05/08/2016 1152   LDLCALC 83 05/08/2016 1152   LDLCALC 122 (H) 07/03/2015 1007      Wt Readings from Last 3 Encounters:  12/28/19 284 lb 8 oz (129 kg)  12/14/19 282 lb (127.9 kg)  06/15/19 282 lb (127.9 kg)        ASSESSMENT AND PLAN:   1.    Chronic atrial fibrillation:  Ventricular rate is controlled with diltiazem.  He is tolerating anticoagulation with Xarelto with no side effects.  Recent labs were reviewed and were unremarkable.  2. Essential hypertension: Blood pressure was initially elevated but I rechecked his blood pressure at the end of the visit and it was 142/70.  I do suspect that he is under stress.  I made no changes in his medications.  3.  Hyperlipidemia: Continue treatment with rosuvastatin.Most recent lipid profile in 10/2018 showed an LDL of 53.  4. Sleep apnea: on CPAP  5. Morbid obesity: I discussed with him the importance of healthy lifestyle changes and resuming exercise.  6.  Reported transient hematuria?  Recent urinalysis showed no evidence of hematuria.  If he has recurrent symptoms, recommend urology evaluation.  Disposition:   FU with me in 6 months  Signed,  Kathlyn Sacramento, MD  12/28/2019 10:27 AM    [place]

## 2019-12-28 NOTE — Patient Instructions (Signed)

## 2020-01-09 ENCOUNTER — Telehealth: Payer: Self-pay | Admitting: Cardiovascular Disease

## 2020-01-09 MED ORDER — LOSARTAN POTASSIUM 50 MG PO TABS: 100.0000 mg | ORAL_TABLET | Freq: Every day | ORAL | 0 refills | Status: DC

## 2020-01-09 NOTE — Telephone Encounter (Signed)
*  STAT* If patient is at the pharmacy, call can be transferred to refill team.   1. Which medications need to be refilled? (please list name of each medication and dose if known)    Losartan 100 mg po q d - per patient pharmacy out of tabs please send rx ok to sub 2 x 50 mg tablets po q d   2. Which pharmacy/location (including street and city if local pharmacy) is medication to be sent to?  [facility]   3. Do they need a 30 day or 90 day supply? 90

## 2020-02-14 ENCOUNTER — Other Ambulatory Visit: Payer: Self-pay | Admitting: Cardiovascular Disease

## 2020-02-29 ENCOUNTER — Ambulatory Visit: Payer: Medicare HMO

## 2020-02-29 ENCOUNTER — Other Ambulatory Visit: Payer: Self-pay

## 2020-02-29 DIAGNOSIS — G4733 Obstructive sleep apnea (adult) (pediatric): Secondary | ICD-10-CM | POA: Diagnosis not present

## 2020-02-29 NOTE — Progress Notes (Unsigned)
95 percentile pressure 8   95th percentile leak 33.4   apnea index 2.9 /hr  apnea-hypopnea index  3.6 /hr   total days used  >4 hr 78 days  total days used <4 hr 7 days  Total compliance 87 percent  He is doing great on cpap no problems or questions

## 2020-03-05 ENCOUNTER — Ambulatory Visit: Payer: Medicare HMO | Admitting: Internal Medicine

## 2020-03-12 ENCOUNTER — Ambulatory Visit: Payer: Medicare HMO | Admitting: Internal Medicine

## 2020-03-12 ENCOUNTER — Encounter: Payer: Self-pay | Admitting: Internal Medicine

## 2020-03-12 VITALS — BP 128/80 | HR 77 | Temp 97.9°F | Ht 67.0 in | Wt 282.4 lb

## 2020-03-12 DIAGNOSIS — G4733 Obstructive sleep apnea (adult) (pediatric): Secondary | ICD-10-CM

## 2020-03-12 DIAGNOSIS — U099 Post covid-19 condition, unspecified: Secondary | ICD-10-CM | POA: Diagnosis not present

## 2020-03-12 DIAGNOSIS — C8590 Non-Hodgkin lymphoma, unspecified, unspecified site: Secondary | ICD-10-CM | POA: Diagnosis not present

## 2020-03-12 NOTE — Patient Instructions (Signed)

## 2020-03-12 NOTE — Progress Notes (Signed)
[facility] [place], [place] [address]  Pulmonary Sleep Medicine   Office Visit Note  Patient Name: Mason Houston DOB: [birth year] MRN [id number]  Date of Service: 03/12/2020  Complaints/HPI: Patient is here for follow up. He is doing well overall. He had covid and is in recovery. Patient was seen at [facility]. He saw Dr Raul Del and note is below    History of Present Illness: Mason Houston is a 70 y.o. male presents to clinic for shortness of breath. Diagnosed with covid 2 weeks ago. Ex smoker, Completed doxycycline and prednisone. He is over weight, + osa, on cpap.(followed by Dr. Manuella Ghazi) He is dyspneic with activity. Wheezing at times, occasional cough, no sputum, not edematous, no angina, pleurisy or fever or hemoptysis. Mason Houston is controled on treatment. There is minimum rhinitis. Discussed the need to lose weight.   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   Current Medications:  Current Outpatient Medications  Medication Sig Dispense Refill  . acetaminophen (TYLENOL) 500 MG tablet Take by mouth.  Marland Kitchen ascorbic acid, vitamin C, (VITAMIN C) 1000 MG tablet Take 1,000 mg by mouth once daily  . cholecalciferol (VITAMIN D3) 1,000 unit capsule Take 1,000 Units by mouth once daily  . diltiazem (TAZTIA XT) 360 MG ER capsule Take by mouth every morning.  Marland Kitchen glipiZIDE (GLUCOTROL) 2.5 MG XL tablet Take 1 tablet by mouth once daily 30 tablet 0  . loratadine (CLARITIN) 10 mg tablet Take by mouth  . losartan (COZAAR) 50 MG tablet Take 100 mg by mouth once daily  . magnesium oxide (MAG-OX) 400 mg tablet Take by mouth.  Marland Kitchen omeprazole (PRILOSEC) 20 MG DR capsule Take 1 capsule by mouth once daily  . rosuvastatin (CRESTOR) 10 MG tablet Take 1 tablet by mouth once daily 90 tablet 1  . vitamin B complex TbER Take 1 tablet by mouth every evening.   Alveda Reasons 20 mg tablet TAKE 1 TABLET BY MOUTH ONCE DAILY WITH SUPPER 90 tablet 0  . chrom  pico/brindal berry (GARCINIA CAMBOGIA ORAL) Take by mouth once daily  . ciclopirox (PENLAC) 8 % topical nail solution Apply topically nightly Apply over nail and surrounding skin. 1 Bottle 6  . diphenhydramine HCl (ALLERGY ORAL) Take by mouth once daily as needed  . metFORMIN (GLUCOPHAGE) 1000 MG tablet Take 1 tablet (1,000 mg total) by mouth 2 (two) times daily with meals 60 tablet 11  . predniSONE (DELTASONE) 20 MG tablet 2qam for 4 days, 1qam for 4days 12 tablet 0  . sildenafiL (VIAGRA) 50 MG tablet Take 1 tablet (50 mg total) by mouth once daily as needed for Erectile Dysfunction for up to 30 days (Patient not taking: Reported on 02/15/2020 ) 5 tablet 0   No current facility-administered medications for this visit.   Problem List:  Patient Active Problem List  Diagnosis  . Atrial fibrillation  . Cerebellar infarction, left PICA  . Osteoarthrosis, unspecified whether generalized or localized, involving lower leg  . Major depressive disorder, single episode  . Type II diabetes mellitus  . Esophageal reflux  . Personal history of disorder of nervous system and sense organs  . Benign essential hypertension  . Sleep apnea  . Diffuse large B-cell lymphoma (CMS-HCC)  . Morbid obesity with BMI of 40.0-44.9, adult (CMS-HCC)  . Hyperlipidemia  . Erectile dysfunction   Allergies:  Celecoxib, Other, and Penicillins  History:  Past Medical History:  Diagnosis Date  . Atrial fibrillation (CMS-HCC)  .  Depression  . Pancreatic mass 01/09/2015  . Sleep apnea  . Stroke (CMS-HCC)   Past Surgical History:  Procedure Laterality Date  . COLONOSCOPY 2012  with EGD  . COLONOSCOPY 09/09/2017  TVA with High Grade Dysplasia/TA/Repeat 82yr/TKT  . COLONOSCOPY 09/21/2018  Tubular adenoma of the colon/Hyperplastic colon polyp/Repeat 24yrs/TKT  . ERCP 12/2014  . ERCP 02/21/2015  Stent placed in biliary tree/ERCP in 2 to 3 months to remove stent/PYO  . ESOPHAGOGASTRODOUDENOSCOPY W/BIOPSY N/A 01/09/2015   Procedure: ESOPHAGOGASTRODUODENOSCOPY, FLEXIBLE, TRANSORAL; WITH BIOPSY, SINGLE OR MULTIPLE; Surgeon: Vernie Ammons, MD; Location: [facility]; Service: Gastroenterology; Laterality: N/A;  . ESOPHAGOGASTRODOUDENOSCOPY W/REMOVAL FOREIGN BODY N/A 01/09/2015  Procedure: ESOPHAGOGASTRODUODENOSCOPY, FLEXIBLE, TRANSORAL; WITH REMOVAL OF FOREIGN BODY(S); Surgeon: Vernie Ammons, MD; Location: [facility]; Service: Gastroenterology; Laterality: N/A;  . PILONIDAL CYST / SINUS EXCISION 1983  . UPPER GASTROINTESTINAL ENDOSCOPY N/A 01/09/2015  Procedure: ESOPHAGOGASTRODUODENOSCOPY, FLEXIBLE, TRANSORAL; W/TRANSENDOSCOPIC ULTRASOUND-GUIDED INTRAMURAL OR TRANSMURAL FINE NEEDLE ASPIRATION/BIOPSY, (ESOPHAGUS, STOMACH OR DUODENUM, AND ADJACENT STRUCTURES); Surgeon: Vernie Ammons, MD; Location: [facility]; Service: Gastroenterology; Laterality: N/A;   Family History  Problem Relation Age of Onset  . Stroke Mother  . Myocardial Infarction (Heart attack) Mother  . Anesthesia problems Neg Hx  . Malignant hyperthermia Neg Hx   SHX: reports that he quit smoking about 39 years ago. His smoking use included cigarettes. He has a 30.00 pack-year smoking history. He has never used smokeless tobacco. He reports that he does not drink alcohol and does not use drugs.  Review of System: As per above. All systems were reviewed with him in totality. No other cardiopulmonary symptoms. No nausea, vomiting, diarrhea, blood in stools,dysuria or flank pain. No GERD, dysphagia, choking spells, polyphagia, polydipsia, heat or cold intolerance, rashes, lymph nodes, bleeding, seizures, TIAs, passing out spells, suicidaly ideation, proximal muscle tenderness, joint effusions. ++ sleep apnea.   Physical Exam: BP (!) 143/84  Pulse 70  Wt (!) 128.8 kg (284 lb)  SpO2 98%  BMI 45.84 kg/m (!) 128.8 kg (284 lb) 98% General: NAD. Able to speak in complete sentences without cough or  dyspnea, OVER WEIGHT HEENT: Normocephalic, nontraumatic. Extraocular movements intact NECK: Supple. No JVD, nodes, thyromegaly, thick, short, neck CV: RRR no murmurs, gallops, rubs PULM: Normal respiratory effort, late exp wheezing on expiration, no crackles ABD: Increase abdominal girth.  EXTREMITIES: No significant edema, cyanosis or Homans'signs SKIN: Fair turgor. No rashes LYMPHATIC: No nodes NEURO: No gross deficits PSYCH: Appropriate affect, alert, oriented   Diagnostics: fvc 2.76 liters ( 82 x%), fev1 2.24 liter ( 84%), ratio 81, fef 81 %, nl exp flow volume loop. Nl spiro.   Impression/ Plan: Here with sob, wheezing, occasional cough, overweight, which is contributing to the shortness of breath, Recent positive covid test. The cxr look pretty benign.  -Weight loss -medrol dose pak -albuterol inhaler two puffs qid -up date [place] in one week -f/u in 4 weeks  Osa, on cpap faithfully -weight loss -cpap on the same settings    Electronically signed by Erby Pian, MD at 02/27/2020 3:21 PM EST      ROS  General: (-) fever, (-) chills, (-) night sweats, (-) weakness Skin: (-) rashes, (-) itching,. Eyes: (-) visual changes, (-) redness, (-) itching. Nose and Sinuses: (-) nasal stuffiness or itchiness, (-) postnasal drip, (-) nosebleeds, (-) sinus trouble. Mouth and Throat: (-) sore throat, (-) hoarseness. Neck: (-) swollen glands, (-) enlarged thyroid, (-) neck pain. Respiratory: + cough, (-) bloody sputum, + shortness  of breath, + wheezing. Cardiovascular: - ankle swelling, (-) chest pain. Lymphatic: (-) lymph node enlargement. Neurologic: (-) numbness, (-) tingling. Psychiatric: (-) anxiety, (-) depression   Current Medication: Outpatient Encounter Medications as of 03/12/2020  Medication Sig  . acetaminophen (TYLENOL) 650 MG CR tablet Take 1,300 mg by mouth daily.  . Ascorbic Acid (VITAMIN C) 1000 MG tablet Take 1,000 mg by mouth daily.  Marland Kitchen b complex vitamins  capsule Take 1 capsule by mouth daily.   . cholecalciferol (VITAMIN D) 1000 units tablet Take 1,000 Units by mouth daily.  Marland Kitchen diltiazem (TIAZAC) 360 MG 24 hr capsule Take 1 capsule by mouth once daily  . glipiZIDE (GLUCOTROL XL) 2.5 MG 24 hr tablet Take 2.5 mg by mouth daily with breakfast.  . loratadine (CLARITIN) 10 MG tablet Take 10 mg by mouth daily as needed.   Marland Kitchen losartan (COZAAR) 50 MG tablet Take 2 tablets (100 mg total) by mouth daily.  . magnesium oxide (MAG-OX) 400 (241.3 Mg) MG tablet Take 250 mg daily by mouth.   . metFORMIN (GLUCOPHAGE) 1000 MG tablet Take 1,000 mg by mouth 2 (two) times daily.  Marland Kitchen omeprazole (PRILOSEC) 20 MG capsule Take 1 capsule by mouth once daily  . rosuvastatin (CRESTOR) 10 MG tablet TAKE 1 TABLET(10 MG) BY MOUTH DAILY  . XARELTO 20 MG TABS tablet TAKE 1 TABLET BY MOUTH ONCE DAILY WITH SUPPER   No facility-administered encounter medications on file as of 03/12/2020.    Surgical History: Past Surgical History:  Procedure Laterality Date  . COLONOSCOPY  2012   Dr Candace Cruise  . COLONOSCOPY WITH PROPOFOL N/A 09/09/2017   Procedure: COLONOSCOPY WITH PROPOFOL;  Surgeon: Toledo, Benay Pike, MD;  Location: [facility] ENDOSCOPY;  Service: Gastroenterology;  Laterality: N/A;  . COLONOSCOPY WITH PROPOFOL N/A 09/21/2018   Procedure: COLONOSCOPY WITH PROPOFOL;  Surgeon: Toledo, Benay Pike, MD;  Location: [facility] ENDOSCOPY;  Service: Gastroenterology;  Laterality: N/A;  . CYST EXCISION    . ERCP N/A 12/29/2014   Procedure: ENDOSCOPIC RETROGRADE CHOLANGIOPANCREATOGRAPHY (ERCP);  Surgeon: Hulen Luster, MD;  Location: [facility] ENDOSCOPY;  Service: Endoscopy;  Laterality: N/A;  . ERCP N/A 02/21/2015   Procedure: ENDOSCOPIC RETROGRADE CHOLANGIOPANCREATOGRAPHY (ERCP);  Surgeon: Hulen Luster, MD;  Location: [facility] ENDOSCOPY;  Service: Gastroenterology;  Laterality: N/A;  . ERCP N/A 08/14/2015   Procedure: ENDOSCOPIC RETROGRADE CHOLANGIOPANCREATOGRAPHY (ERCP) Stent removal;  Surgeon: [name], MD;  Location:  [facility] ENDOSCOPY;  Service: Endoscopy;  Laterality: N/A;  . PERIPHERAL VASCULAR CATHETERIZATION N/A 01/31/2015   Procedure: Glori Luis Cath Insertion;  Surgeon: Katha Cabal, MD;  Location: [facility] CV LAB;  Service: Cardiovascular;  Laterality: N/A;  . PERIPHERAL VASCULAR CATHETERIZATION N/A 12/18/2015   Procedure: Glori Luis Cath Removal;  Surgeon: Katha Cabal, MD;  Location: [place] CV LAB;  Service: Cardiovascular;  Laterality: N/A;  . TRIGGER FINGER RELEASE      Medical History: Past Medical History:  Diagnosis Date  . Atrial fibrillation ([place])   . Cancer ([place])   . Collagen vascular disease ([place])   . Depression   . Diabetes mellitus without complication ([place])   . Dysrhythmia   . Fibromyalgia   . GERD (gastroesophageal reflux disease)   . H/O Bell's palsy   . History of [place] measles   . Hypertension   . Mini stroke ([place])   . Non Hodgkin's lymphoma ([place]) 2017  . Osteoarthrosis, unspecified whether generalized or localized, lower leg   . Sixth cranial nerve palsy   . Sleep apnea   .  Stroke [facility])     Family History: Family History  Problem Relation Age of Onset  . Hypertension Mother   . Heart attack Father   . Stroke Father     Social History: Social History   Socioeconomic History  . Marital status: Married    Spouse name: Not on file  . Number of children: Not on file  . Years of education: Not on file  . Highest education level: Not on file  Occupational History  . Not on file  Tobacco Use  . Smoking status: Former Smoker    Packs/day: 4.50    Years: 15.00    Pack years: 67.50    Types: Cigarettes  . Smokeless tobacco: Never Used  Vaping Use  . Vaping Use: Never used  Substance and Sexual Activity  . Alcohol use: No  . Drug use: No    Types: Marijuana    Comment: PAST  . Sexual activity: Not on file  Other Topics Concern  . Not on file  Social History Narrative  . Not on file   Social Determinants of Health   Financial Resource  Strain: Not on file  Food Insecurity: Not on file  Transportation Needs: Not on file  Physical Activity: Not on file  Stress: Not on file  Social Connections: Not on file  Intimate Partner Violence: Not on file    Vital Signs: Blood pressure 128/80, pulse 77, temperature 97.9 F (36.6 C), height 5\' 7"  (1.702 m), weight 282 lb 6.4 oz (128.1 kg), SpO2 96 %.  Examination: General Appearance: The patient is well-developed, well-nourished, and in no distress. Skin: Gross inspection of skin unremarkable. Head: normocephalic, no gross deformities. Eyes: no gross deformities noted. ENT: ears appear grossly normal no exudates. Neck: Supple. No thyromegaly. No LAD. Respiratory: no rhonchi noted. Cardiovascular: Normal S1 and S2 without murmur or rub. Extremities: No cyanosis. pulses are equal. Neurologic: Alert and oriented. No involuntary movements.  LABS: Recent Results (from the past 2160 hour(s))  CBC with Differential     Status: None   Collection Time: 12/14/19  9:37 AM  Result Value Ref Range   WBC 7.9 4.0 - 10.5 K/uL   RBC 5.11 4.22 - 5.81 MIL/uL   Hemoglobin 14.7 13.0 - 17.0 g/dL   HCT 44.0 39.0 - 52.0 %   MCV 86.1 80.0 - 100.0 fL   MCH 28.8 26.0 - 34.0 pg   MCHC 33.4 30.0 - 36.0 g/dL   RDW 13.2 11.5 - 15.5 %   Platelets 341 150 - 400 K/uL   nRBC 0.0 0.0 - 0.2 %   Neutrophils Relative % 58 %   Neutro Abs 4.6 1.7 - 7.7 K/uL   Lymphocytes Relative 25 %   Lymphs Abs 1.9 0.7 - 4.0 K/uL   Monocytes Relative 12 %   Monocytes Absolute 1.0 0.1 - 1.0 K/uL   Eosinophils Relative 4 %   Eosinophils Absolute 0.3 0.0 - 0.5 K/uL   Basophils Relative 1 %   Basophils Absolute 0.1 0.0 - 0.1 K/uL   Immature Granulocytes 0 %   Abs Immature Granulocytes 0.02 0.00 - 0.07 K/uL    Comment: Performed at [facility], [address] [place], [place] [address]  Comprehensive metabolic panel     Status: Abnormal   Collection Time: 12/14/19  9:37 AM  Result Value Ref Range   Sodium  137 135 - 145 mmol/L   Potassium 4.9 3.5 - 5.1 mmol/L   Chloride 97 (L) 98 - 111  mmol/L   CO2 27 22 - 32 mmol/L   Glucose, Bld 134 (H) 70 - 99 mg/dL    Comment: Glucose reference range applies only to samples taken after fasting for at least 8 hours.   BUN 19 8 - 23 mg/dL   Creatinine, Ser 1.24 0.61 - 1.24 mg/dL   Calcium 9.7 8.9 - 10.3 mg/dL   Total Protein 7.9 6.5 - 8.1 g/dL   Albumin 4.5 3.5 - 5.0 g/dL   AST 26 15 - 41 U/L   ALT 25 0 - 44 U/L   Alkaline Phosphatase 67 38 - 126 U/L   Total Bilirubin 1.1 0.3 - 1.2 mg/dL   GFR, Estimated >60 >60 mL/min    Comment: (NOTE) Calculated using the CKD-EPI Creatinine Equation (2021)    Anion gap 13 5 - 15    Comment: Performed at [facility], [address] [place], [place] [address]  Lactate dehydrogenase     Status: None   Collection Time: 12/14/19  9:37 AM  Result Value Ref Range   LDH 141 98 - 192 U/L    Comment: Performed at [facility], [address] [place], [place] [address]  Urinalysis, Complete w Microscopic     Status: Abnormal   Collection Time: 12/14/19 11:18 AM  Result Value Ref Range   Color, Urine YELLOW (A) YELLOW   APPearance CLOUDY (A) CLEAR   Specific Gravity, Urine 1.012 1.005 - 1.030   pH 7.0 5.0 - 8.0   Glucose, UA NEGATIVE NEGATIVE mg/dL   Hgb urine dipstick NEGATIVE NEGATIVE   Bilirubin Urine NEGATIVE NEGATIVE   Ketones, ur NEGATIVE NEGATIVE mg/dL   Protein, ur NEGATIVE NEGATIVE mg/dL   Nitrite NEGATIVE NEGATIVE   Leukocytes,Ua SMALL (A) NEGATIVE   RBC / HPF 0-5 0 - 5 RBC/hpf   WBC, UA 21-50 0 - 5 WBC/hpf   Bacteria, UA NONE SEEN NONE SEEN   Squamous Epithelial / LPF 0-5 0 - 5    Comment: Performed at [facility], [address], [place], [place] [address]    Radiology: No results found.  No results found.  No results found.    Assessment and Plan: Patient Active Problem List   Diagnosis Date Noted  . Abdominal abscess 12/13/2015  . Diffuse large b-cell  lymphoma, intra-abdominal lymph nodes ([place]) 12/06/2015  . Localized skin mass, lump, or swelling 11/09/2015  . Internal nasal lesion 10/30/2015  . Fitting and adjustment of gastrointestinal appliance and device   . Disease of biliary tract   . History of stroke 07/25/2015  . Annual physical exam 07/03/2015  . Pneumonia 05/15/2015  . Fever 05/15/2015  . Hypoxia 05/07/2015  . Pancreatic mass 01/12/2015  . Obstructive jaundice 12/28/2014  . Arthritis 06/09/2014  . Benign essential HTN 10/04/2013  . Cerebellar infarction ([place]) 10/04/2013  . Acid reflux 10/04/2013  . Arthritis, degenerative 10/04/2013  . HLD (hyperlipidemia) 10/04/2013  . H/O disease 10/04/2013  . Apnea, sleep 10/04/2013  . Type 2 diabetes mellitus ([place]) 10/04/2013  . Stroke ([place]) 09/08/2013  . Obstructive sleep apnea 02/03/2013  . Depression 07/05/2012  . Major depressive disorder with single episode 07/05/2012  . Hyperlipidemia LDL goal <100 12/23/2011  . Atrial fibrillation ([place])   . Hypertension     1. OSA (obstructive sleep apnea) Doing well with his OSA and CPAP use. He states no major issues with the device. He has owned it for 7 years. Patient has gained weight unfortunately  CPAP Counseling: had a lengthy  discussion with the patient regarding the importance of PAP therapy in management of the sleep apnea. Patient appears to understand the risk factor reduction and also understands the risks associated with untreated sleep apnea. Patient will try to make a good faith effort to remain compliant with therapy. Also instructed the patient on proper cleaning of the device including the water must be changed daily if possible and use of distilled water is preferred. Patient understands that the machine should be regularly cleaned with appropriate recommended cleaning solutions that do not damage the PAP machine for example given white vinegar and water rinses. Other methods such as ozone treatment may not be as good as  these simple methods to achieve cleaning.  2. Obesity, morbid ([place])  Obesity Counseling: Risk Assessment: An assessment of behavioral risk factors was made today and includes lack of exercise sedentary lifestyle, lack of portion control and poor dietary habits.  Risk Modification Advice: Mason Houston was counseled on portion control guidelines. Restricting daily caloric intake to 2000. The detrimental long term effects of obesity on her health and ongoing poor compliance was also discussed with the patient.    3. COVID-19 long hauler Slow to improve he had been on steroids now showing some improvement  4. Non-Hodgkin's lymphoma, unspecified body region, unspecified non-Hodgkin lymphoma type ([place]) Stable in remission  General Counseling: I have discussed the findings of the evaluation and examination with Mason Houston.  I have also discussed any further diagnostic evaluation thatmay be needed or ordered today. Mason Houston verbalizes understanding of the findings of todays visit. We also reviewed his medications today and discussed drug interactions and side effects including but not limited excessive drowsiness and altered mental states. We also discussed that there is always a risk not just to him but also people around him. he has been encouraged to call the office with any questions or concerns that should arise related to todays visit.  No orders of the defined types were placed in this encounter.    Time spent: 35  I have personally obtained a history, examined the patient, evaluated laboratory and imaging results, formulated the assessment and plan and placed orders.    Allyne Gee, MD [facility] Pulmonary and Critical Care Sleep medicine

## 2020-04-06 ENCOUNTER — Other Ambulatory Visit: Payer: Self-pay | Admitting: Cardiovascular Disease

## 2020-06-13 ENCOUNTER — Inpatient Hospital Stay: Payer: Medicare HMO | Admitting: Internal Medicine

## 2020-06-13 ENCOUNTER — Inpatient Hospital Stay: Payer: Medicare HMO | Attending: Internal Medicine

## 2020-06-13 ENCOUNTER — Encounter: Payer: Self-pay | Admitting: Internal Medicine

## 2020-06-13 DIAGNOSIS — I480 Paroxysmal atrial fibrillation: Secondary | ICD-10-CM | POA: Insufficient documentation

## 2020-06-13 DIAGNOSIS — G4733 Obstructive sleep apnea (adult) (pediatric): Secondary | ICD-10-CM | POA: Insufficient documentation

## 2020-06-13 DIAGNOSIS — C8333 Diffuse large B-cell lymphoma, intra-abdominal lymph nodes: Secondary | ICD-10-CM

## 2020-06-13 DIAGNOSIS — Z79899 Other long term (current) drug therapy: Secondary | ICD-10-CM | POA: Insufficient documentation

## 2020-06-13 DIAGNOSIS — J449 Chronic obstructive pulmonary disease, unspecified: Secondary | ICD-10-CM | POA: Diagnosis not present

## 2020-06-13 DIAGNOSIS — C8338 Diffuse large B-cell lymphoma, lymph nodes of multiple sites: Secondary | ICD-10-CM | POA: Diagnosis present

## 2020-06-13 DIAGNOSIS — E1165 Type 2 diabetes mellitus with hyperglycemia: Secondary | ICD-10-CM | POA: Insufficient documentation

## 2020-06-13 DIAGNOSIS — Z7984 Long term (current) use of oral hypoglycemic drugs: Secondary | ICD-10-CM | POA: Diagnosis not present

## 2020-06-13 DIAGNOSIS — Z7901 Long term (current) use of anticoagulants: Secondary | ICD-10-CM | POA: Diagnosis not present

## 2020-06-13 LAB — COMPREHENSIVE METABOLIC PANEL
ALT: 25 U/L (ref 0–44)
AST: 31 U/L (ref 15–41)
Albumin: 4.2 g/dL (ref 3.5–5.0)
Alkaline Phosphatase: 60 U/L (ref 38–126)
Anion gap: 12 (ref 5–15)
BUN: 19 mg/dL (ref 8–23)
CO2: 23 mmol/L (ref 22–32)
Calcium: 9 mg/dL (ref 8.9–10.3)
Chloride: 103 mmol/L (ref 98–111)
Creatinine, Ser: 1.13 mg/dL (ref 0.61–1.24)
GFR, Estimated: >60 mL/min (ref >60)
Glucose, Bld: 170 mg/dL — ABNORMAL HIGH (ref 70–99)
Potassium: 4.3 mmol/L (ref 3.5–5.1)
Sodium: 138 mmol/L (ref 135–145)
Total Bilirubin: 0.7 mg/dL (ref 0.3–1.2)
Total Protein: 7.4 g/dL (ref 6.5–8.1)

## 2020-06-13 LAB — CBC WITH DIFFERENTIAL/PLATELET
Abs Immature Granulocytes: 0.03 10*3/uL (ref 0.00–0.07)
Basophils Absolute: 0 10*3/uL (ref 0.0–0.1)
Basophils Relative: 1 %
Eosinophils Absolute: 0.3 10*3/uL (ref 0.0–0.5)
Eosinophils Relative: 4 %
HCT: 40.5 % (ref 39.0–52.0)
Hemoglobin: 13.4 g/dL (ref 13.0–17.0)
Immature Granulocytes: 0 %
Lymphocytes Relative: 23 %
Lymphs Abs: 1.6 10*3/uL (ref 0.7–4.0)
MCH: 28.7 pg (ref 26.0–34.0)
MCHC: 33.1 g/dL (ref 30.0–36.0)
MCV: 86.7 fL (ref 80.0–100.0)
Monocytes Absolute: 0.8 10*3/uL (ref 0.1–1.0)
Monocytes Relative: 12 %
Neutro Abs: 4.1 10*3/uL (ref 1.7–7.7)
Neutrophils Relative %: 60 %
Platelets: 340 10*3/uL (ref 150–400)
RBC: 4.67 MIL/uL (ref 4.22–5.81)
RDW: 12.9 % (ref 11.5–15.5)
WBC: 6.9 10*3/uL (ref 4.0–10.5)
nRBC: 0 % (ref 0.0–0.2)

## 2020-06-13 LAB — LACTATE DEHYDROGENASE: LDH: 124 U/L (ref 98–192)

## 2020-06-13 NOTE — Assessment & Plan Note (Addendum)
#   DLBCL of pancreatic head-  Stage IE. S/p R-CHOP [finished 2017 April].  STABLE  Currently no evidence of disease; monitor clinically.  Labs within normal limits.  # DM- FBS-174;/poorly controlled; continue metformin/Glucotrol; discussed regarding dietary interventions /reducing carbohydrates.  Recommend evaluation by dietary/defer to PCP for referral.  # Paroxysmal A.fib- on xarelto-[Dr.Arida] stable no bleeding.  # COPD/OSA-question "long COVID"-mild to moderate difficulty breathing.  Defer to PCP  # DISPOSITION:  # Follow up in 6 months/-MD-labs-cbc/cmp/dh-Dr.B.   # 25 minutes face-to-face with the patient discussing the above plan of care; more than 50% of time spent on prognosis/ natural history; counseling and coordination.

## 2020-06-13 NOTE — Progress Notes (Signed)
[place] OFFICE PROGRESS NOTE  Patient Care Team: Baxter Hire, MD as PCP - General (Internal Medicine) Wellington Hampshire, MD as PCP - Cardiology (Cardiology) [name], MD (Infectious Diseases) Wellington Hampshire, MD as Consulting Physician (Cardiology) Bary Castilla Forest Gleason, MD (General Surgery) Roselee Nova, MD as Referring Physician [facility])  Cancer Staging No matching staging information was found for the patient.   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 Toledo]  --------------------------------------------------------   DIAGNOSIS: [Jan 2017 ]- DLBCL STAGE: IE  ;GOALS: CURATIVE CURRENT/MOST RECENT THERAPY [finished April 2017- R-CHOP X5]-surveillance    Diffuse large b-cell lymphoma, intra-abdominal lymph nodes ([place])      INTERVAL HISTORY:  Mason Houston 70 y.o.  male pleasant patient above history of diffuse large B-cell lymphoma is here for follow-up.  Patient has not any hospitalizations.   However patient had COVID in February 2022 [patient was vaccinated]-noted to have continued symptoms of shortness of breath exertion.  Has been evaluated by pulmonary/cardiology.  Denies any blood in stools or black-colored stools but denies any blood in urine.  He continues to be frustrated with weight gain.  Diabetes not very well controlled.    Review of Systems  Constitutional:  Negative for chills, diaphoresis, fever, malaise/fatigue and weight loss.  HENT: Negative for nosebleeds and sore throat.   Eyes: Negative for double vision.  Respiratory: Negative for cough, hemoptysis, sputum production, shortness of breath and wheezing.   Cardiovascular: Negative for chest pain, palpitations, orthopnea and leg swelling.  Gastrointestinal: Negative for abdominal pain, blood in stool, constipation, diarrhea, heartburn, melena, nausea and vomiting.  Genitourinary: Negative for dysuria, frequency and urgency.  Musculoskeletal: Negative for back pain and joint pain.  Skin: Negative.  Negative for itching and rash.  Neurological: Negative for tingling, focal weakness, weakness and headaches.  Endo/Heme/Allergies: Does not bruise/bleed easily.  Psychiatric/Behavioral: Negative for depression. The patient is nervous/anxious. The patient does not have insomnia.       PAST MEDICAL HISTORY :  Past Medical History:  Diagnosis Date  . Atrial fibrillation ([place])   . Cancer ([place])   . Collagen vascular disease ([place])   . Depression   . Diabetes mellitus without complication ([place])   . Dysrhythmia   . Fibromyalgia   . GERD (gastroesophageal reflux disease)   . H/O Bell's palsy   . History of [place] measles   . Hypertension   . Mini stroke ([place])   . Non Hodgkin's lymphoma ([place]) 2017  . Osteoarthrosis, unspecified whether generalized or localized, lower leg   . Sixth cranial nerve palsy   . Sleep apnea   . Stroke [facility])     PAST SURGICAL HISTORY :   Past Surgical History:  Procedure Laterality Date  . COLONOSCOPY  2012   Dr Candace Cruise  . COLONOSCOPY WITH PROPOFOL N/A 09/09/2017   Procedure: COLONOSCOPY WITH PROPOFOL;  Surgeon: Toledo, Benay Pike, MD;  Location: [facility] ENDOSCOPY;  Service: Gastroenterology;  Laterality: N/A;  . COLONOSCOPY WITH PROPOFOL N/A 09/21/2018   Procedure: COLONOSCOPY WITH PROPOFOL;  Surgeon: Toledo, Benay Pike, MD;  Location: [facility] ENDOSCOPY;  Service:  Gastroenterology;  Laterality: N/A;  . CYST EXCISION    . ERCP N/A 12/29/2014   Procedure: ENDOSCOPIC RETROGRADE CHOLANGIOPANCREATOGRAPHY (ERCP);  Surgeon: Hulen Luster, MD;  Location: [facility] ENDOSCOPY;  Service: Endoscopy;  Laterality: N/A;  . ERCP N/A 02/21/2015   Procedure: ENDOSCOPIC RETROGRADE CHOLANGIOPANCREATOGRAPHY (ERCP);  Surgeon: Hulen Luster, MD;  Location: [facility] ENDOSCOPY;  Service: Gastroenterology;  Laterality: N/A;  . ERCP N/A 08/14/2015   Procedure: ENDOSCOPIC RETROGRADE CHOLANGIOPANCREATOGRAPHY (ERCP) Stent removal;  Surgeon: [name], MD;  Location: [facility] ENDOSCOPY;  Service: Endoscopy;  Laterality: N/A;  . PERIPHERAL VASCULAR CATHETERIZATION N/A 01/31/2015   Procedure: Glori Luis Cath Insertion;  Surgeon: Katha Cabal, MD;  Location: [place] CV LAB;  Service: Cardiovascular;  Laterality: N/A;  . PERIPHERAL VASCULAR CATHETERIZATION N/A 12/18/2015   Procedure: Glori Luis Cath Removal;  Surgeon: Katha Cabal, MD;  Location: [place] CV LAB;  Service: Cardiovascular;  Laterality: N/A;  . TRIGGER FINGER RELEASE      FAMILY HISTORY :   Family History  Problem Relation Age of Onset  . Hypertension Mother   . Heart attack Father   . Stroke Father     SOCIAL HISTORY:   Social History   Tobacco Use  . Smoking status: Former Smoker    Packs/day: 4.50    Years: 15.00    Pack years: 67.50    Types: Cigarettes  . Smokeless tobacco: Never Used  Vaping Use  . Vaping Use: Never used  Substance Use Topics  . Alcohol use: No  . Drug use: No    Types: Marijuana    Comment: PAST    ALLERGIES:  is allergic to celebrex [celecoxib], other, and penicillins.  MEDICATIONS:  Current Outpatient Medications  Medication Sig Dispense Refill  . acetaminophen (TYLENOL) 650 MG CR tablet Take 1,300 mg by mouth daily.    . Ascorbic Acid (VITAMIN C) 1000 MG tablet Take 1,000 mg by mouth daily.    Marland Kitchen b complex vitamins capsule Take 1 capsule by mouth daily.     . cholecalciferol  (VITAMIN D) 1000 units tablet Take 1,000 Units by mouth daily.    Marland Kitchen diltiazem (TIAZAC) 360 MG 24 hr capsule Take 1 capsule by mouth once daily 90 capsule 3  . doxycycline (VIBRAMYCIN) 100 MG capsule     . glipiZIDE (GLUCOTROL XL) 2.5 MG 24 hr tablet Take 2.5 mg by mouth daily with breakfast. Dr. Wynetta Emery has changed it to 5mg .    . loratadine (CLARITIN) 10 MG tablet Take 10 mg by mouth daily as needed.     Marland Kitchen losartan (COZAAR) 50 MG tablet Take 2 tablets by mouth once daily 180 tablet 0  . magnesium oxide (MAG-OX) 400 (241.3 Mg) MG tablet Take 250 mg daily by mouth.     . metFORMIN (GLUCOPHAGE) 1000 MG tablet Take 1,000 mg by mouth 2 (two) times daily.    . montelukast (SINGULAIR) 10 MG tablet Take 1 tablet by mouth at bedtime.    Marland Kitchen omeprazole (PRILOSEC) 20 MG capsule Take 1 capsule by mouth once daily 90 capsule 3  . predniSONE (DELTASONE) 20 MG tablet     . rosuvastatin (CRESTOR) 10 MG tablet TAKE 1 TABLET(10 MG) BY MOUTH DAILY 90 tablet 0  . XARELTO 20 MG TABS tablet TAKE 1 TABLET BY MOUTH ONCE DAILY WITH SUPPER 90 tablet 1  .  albuterol (VENTOLIN HFA) 108 (90 Base) MCG/ACT inhaler Inhale into the lungs. (Patient not taking: Reported on 06/13/2020)     No current facility-administered medications for this visit.    PHYSICAL EXAMINATION: ECOG PERFORMANCE STATUS: 0 - Asymptomatic  BP (!) 110/53   Pulse 64   Temp 97.7 F (36.5 C)   Resp 20   Wt 280 lb 12.8 oz (127.4 kg)   SpO2 100%   BMI 43.98 kg/m   Filed Weights   06/13/20 1039  Weight: 280 lb 12.8 oz (127.4 kg)    Physical Exam HENT:     Head: Normocephalic and atraumatic.     Mouth/Throat:     Pharynx: No oropharyngeal exudate.  Eyes:     Pupils: Pupils are equal, round, and reactive to light.  Cardiovascular:     Rate and Rhythm: Normal rate and regular rhythm.  Pulmonary:     Effort: No respiratory distress.     Breath sounds: No wheezing.  Abdominal:     General: Bowel sounds are normal. There is no distension.      Palpations: Abdomen is soft. There is no mass.     Tenderness: There is no abdominal tenderness. There is no guarding or rebound.  Musculoskeletal:        General: No tenderness. Normal range of motion.     Cervical back: Normal range of motion and neck supple.  Skin:    General: Skin is warm.  Neurological:     Mental Status: He is alert and oriented to person, place, and time.  Psychiatric:        Mood and Affect: Affect normal.        LABORATORY DATA:  I have reviewed the data as listed    Component Value Date/Time   NA 138 06/13/2020 0956   NA 142 05/15/2015 1156   NA 139 12/19/2011 1044   K 4.3 06/13/2020 0956   K 4.1 12/19/2011 1044   CL 103 06/13/2020 0956   CL 106 12/19/2011 1044   CO2 23 06/13/2020 0956   CO2 26 12/19/2011 1044   GLUCOSE 170 (H) 06/13/2020 0956   GLUCOSE 95 12/19/2011 1044   BUN 19 06/13/2020 0956   BUN 16 05/15/2015 1156   BUN 18 12/19/2011 1044   CREATININE 1.13 06/13/2020 0956   CREATININE 0.88 05/08/2016 1152   CALCIUM 9.0 06/13/2020 0956   CALCIUM 9.0 12/19/2011 1044   PROT 7.4 06/13/2020 0956   PROT 6.1 05/15/2015 1156   PROT 9.0 (H) 12/19/2011 1044   ALBUMIN 4.2 06/13/2020 0956   ALBUMIN 3.7 05/15/2015 1156   ALBUMIN 4.4 12/19/2011 1044   AST 31 06/13/2020 0956   AST 44 (H) 12/19/2011 1044   ALT 25 06/13/2020 0956   ALT 46 12/19/2011 1044   ALKPHOS 60 06/13/2020 0956   ALKPHOS 110 12/19/2011 1044   BILITOT 0.7 06/13/2020 0956   BILITOT 0.2 05/15/2015 1156   BILITOT 0.7 12/19/2011 1044   GFRNONAA >60 06/13/2020 0956   GFRNONAA >89 05/08/2016 1152   GFRAA >60 06/15/2019 0948   GFRAA >89 05/08/2016 1152    No results found for: SPEP, UPEP  Lab Results  Component Value Date   WBC 6.9 06/13/2020   NEUTROABS 4.1 06/13/2020   HGB 13.4 06/13/2020   HCT 40.5 06/13/2020   MCV 86.7 06/13/2020   PLT 340 06/13/2020      Chemistry      Component Value Date/Time   NA 138 06/13/2020 0956   NA 142 05/15/2015  1156   NA 139  12/19/2011 1044   K 4.3 06/13/2020 0956   K 4.1 12/19/2011 1044   CL 103 06/13/2020 0956   CL 106 12/19/2011 1044   CO2 23 06/13/2020 0956   CO2 26 12/19/2011 1044   BUN 19 06/13/2020 0956   BUN 16 05/15/2015 1156   BUN 18 12/19/2011 1044   CREATININE 1.13 06/13/2020 0956   CREATININE 0.88 05/08/2016 1152      Component Value Date/Time   CALCIUM 9.0 06/13/2020 0956   CALCIUM 9.0 12/19/2011 1044   ALKPHOS 60 06/13/2020 0956   ALKPHOS 110 12/19/2011 1044   AST 31 06/13/2020 0956   AST 44 (H) 12/19/2011 1044   ALT 25 06/13/2020 0956   ALT 46 12/19/2011 1044   BILITOT 0.7 06/13/2020 0956   BILITOT 0.2 05/15/2015 1156   BILITOT 0.7 12/19/2011 1044       RADIOGRAPHIC STUDIES: I have personally reviewed the radiological images as listed and agreed with the findings in the report. No results found.   ASSESSMENT & PLAN:  Diffuse large b-cell lymphoma, intra-abdominal lymph nodes ([facility]) # DLBCL of pancreatic head-  Stage IE. S/p R-CHOP [finished 2017 April].  STABLE  Currently no evidence of disease; monitor clinically.  Labs within normal limits.  # DM- FBS-174;/poorly controlled; continue metformin/Glucotrol; discussed regarding dietary interventions /reducing carbohydrates.  Recommend evaluation by dietary/defer to PCP for referral.  # Paroxysmal A.fib- on xarelto-[Dr.Arida] stable no bleeding.  # COPD/OSA-question "long COVID"-mild to moderate difficulty breathing.  Defer to PCP  # DISPOSITION:  # Follow up in 6 months/-MD-labs-cbc/cmp/dh-Dr.B.      Orders Placed This Encounter  Procedures  . CBC with Differential/Platelet    Standing Status:   Future    Standing Expiration Date:   06/13/2021  . Comprehensive metabolic panel    Standing Status:   Future    Standing Expiration Date:   06/13/2021  . Lactate dehydrogenase    Standing Status:   Future    Standing Expiration Date:   06/13/2021   All questions were answered. The patient knows to call the clinic with any  problems, questions or concerns.      Cammie Sickle, MD 06/13/2020 7:31 PM

## 2020-06-25 ENCOUNTER — Encounter: Payer: Self-pay | Admitting: Physician Assistant

## 2020-06-25 ENCOUNTER — Ambulatory Visit: Payer: Medicare HMO | Admitting: Physician Assistant

## 2020-06-25 ENCOUNTER — Other Ambulatory Visit: Payer: Self-pay

## 2020-06-25 VITALS — BP 130/72 | HR 70 | Ht 67.0 in | Wt 284.0 lb

## 2020-06-25 DIAGNOSIS — R0600 Dyspnea, unspecified: Secondary | ICD-10-CM

## 2020-06-25 DIAGNOSIS — I482 Chronic atrial fibrillation, unspecified: Secondary | ICD-10-CM | POA: Diagnosis not present

## 2020-06-25 DIAGNOSIS — Z8616 Personal history of COVID-19: Secondary | ICD-10-CM | POA: Diagnosis not present

## 2020-06-25 DIAGNOSIS — I340 Nonrheumatic mitral (valve) insufficiency: Secondary | ICD-10-CM

## 2020-06-25 DIAGNOSIS — I10 Essential (primary) hypertension: Secondary | ICD-10-CM | POA: Diagnosis not present

## 2020-06-25 DIAGNOSIS — G4733 Obstructive sleep apnea (adult) (pediatric): Secondary | ICD-10-CM

## 2020-06-25 DIAGNOSIS — E785 Hyperlipidemia, unspecified: Secondary | ICD-10-CM

## 2020-06-25 DIAGNOSIS — Z6841 Body Mass Index (BMI) 40.0 and over, adult: Secondary | ICD-10-CM

## 2020-06-25 DIAGNOSIS — E1169 Type 2 diabetes mellitus with other specified complication: Secondary | ICD-10-CM

## 2020-06-25 NOTE — Progress Notes (Signed)
Office Visit    Patient Name: Mason Houston Date of Encounter: 06/25/2020  PCP:  Baxter Hire, MD   [place]  Cardiologist:  Kathlyn Sacramento, MD  Advanced Practice Provider:  No care team member to display Electrophysiologist:  None   Chief Complaint    Chief Complaint  Patient presents with   Follow-up    6 Months follow up. Medications verbally reviewed with patient.     70 y.o. male with history of chronic atrial fibrillation, hypertension, sleep apnea on CPAP, COVID-19 infection 02/2020, stroke 08/2013, non-Hodgkin's lymphoma 2017 treated with chemotherapy, obesity, previous history of smoking/tobacco use, and here today for 65-month follow-up.  Past Medical History    Past Medical History:  Diagnosis Date   Atrial fibrillation ([place])    Cancer ([place])    Collagen vascular disease ([place])    Depression    Diabetes mellitus without complication ([place])    Dysrhythmia    Fibromyalgia    GERD (gastroesophageal reflux disease)    H/O Bell's palsy    History of German measles    Hypertension    Mini stroke ([place])    Non Hodgkin's lymphoma ([place]) 2017   Osteoarthrosis, unspecified whether generalized or localized, lower leg    Sixth cranial nerve palsy    Sleep apnea    Stroke [facility])    Past Surgical History:  Procedure Laterality Date   COLONOSCOPY  2012   Dr Candace Cruise   COLONOSCOPY WITH PROPOFOL N/A 09/09/2017   Procedure: COLONOSCOPY WITH PROPOFOL;  Surgeon: Toledo, Benay Pike, MD;  Location: [facility] ENDOSCOPY;  Service: Gastroenterology;  Laterality: N/A;   COLONOSCOPY WITH PROPOFOL N/A 09/21/2018   Procedure: COLONOSCOPY WITH PROPOFOL;  Surgeon: Toledo, Benay Pike, MD;  Location: [facility] ENDOSCOPY;  Service: Gastroenterology;  Laterality: N/A;   CYST EXCISION     ERCP N/A 12/29/2014   Procedure: ENDOSCOPIC RETROGRADE CHOLANGIOPANCREATOGRAPHY (ERCP);  Surgeon: Hulen Luster, MD;  Location: [facility] ENDOSCOPY;  Service: Endoscopy;  Laterality: N/A;   ERCP N/A  02/21/2015   Procedure: ENDOSCOPIC RETROGRADE CHOLANGIOPANCREATOGRAPHY (ERCP);  Surgeon: Hulen Luster, MD;  Location: [facility] ENDOSCOPY;  Service: Gastroenterology;  Laterality: N/A;   ERCP N/A 08/14/2015   Procedure: ENDOSCOPIC RETROGRADE CHOLANGIOPANCREATOGRAPHY (ERCP) Stent removal;  Surgeon: [name], MD;  Location: [facility] ENDOSCOPY;  Service: Endoscopy;  Laterality: N/A;   PERIPHERAL VASCULAR CATHETERIZATION N/A 01/31/2015   Procedure: Glori Luis Cath Insertion;  Surgeon: Katha Cabal, MD;  Location: [place] CV LAB;  Service: Cardiovascular;  Laterality: N/A;   PERIPHERAL VASCULAR CATHETERIZATION N/A 12/18/2015   Procedure: Glori Luis Cath Removal;  Surgeon: Katha Cabal, MD;  Location: [place] CV LAB;  Service: Cardiovascular;  Laterality: N/A;   TRIGGER FINGER RELEASE      Allergies  Allergies  Allergen Reactions   Celebrex [Celecoxib] Hives   Other Itching    FAB LAUNDRY DETERGENT (itching)   Penicillins Hives and Other (See Comments)    Has patient had a PCN reaction causing immediate rash, facial/tongue/throat swelling, SOB or lightheadedness with hypotension: No Has patient had a PCN reaction causing severe rash involving mucus membranes or skin necrosis: No Has patient had a PCN reaction that required hospitalization No Has patient had a PCN reaction occurring within the last 10 years: No If all of the above answers are "NO", then may proceed with Cephalosporin use.    History of Present Illness    Mason Houston is a 70 y.o. male with PMH as above.  He  has history of atrial fibrillation, sleep apnea on CPAP, stroke, non-Hodgkin's lymphoma diagnosed in 2017 and treated with chemotherapy, hypertension, obesity, and is seen today for 64-month follow-up.  Seen in office 06/14/2019.  At that time, it was noted atrial fibrillation was treated with rate control only due to lack of symptoms.  He was maintained on diltiazem with controlled ventricular rate, and though somewhat on  the low side, asymptomatic.  He was doing well without any chest pain or palpitations.  BP was well controlled since losartan was increased.  He reported stable exertional dyspnea, mainly if he went uphill.  He was tolerating anticoagulation with Xarelto well.  Rosuvastatin was continued for LDL control.  Ongoing CPAP compliance recommended.  Lifestyle changes with diet and exercise discussed.  On 12/28/2019, he reported a urinalysis without evidence of hematuria.  He reported being under significant stress with his living situation.  His stepson was 69 years old and lived with them, and he was reportedly an alcoholic.  BP was initially elevated but improved by the end of the visit.  No medication changes were made, given elevated BP was attributed to stress.  It was noted that, if recurrent episodes of hematuria, urology evaluation should be considered.  Since his last clinic visit, he has had COVID-19 ( 02/2020) with report of ongoing breathing issues since that time.  He follows with Dr. Raul Del for his recovery.  Today, 06/25/2020, he returns to clinic and notes that he had COVID-19 since his last visit with shortness of breath improving since that time.  He remains in atrial fibrillation with rate control only due to lack of symptoms.  No tachypalpitations, presyncope, or syncope.  He does note shortness of breath, and he wonders the extent to which COVID-19 has contributed to his breathing issues.  Shortness of breath occurs whenever he feels excited, as well as if he talks for too long.  He also notes bendopnea and dyspnea with walking.  He describes his shortness of breath as a feeling of being unable to breathe out completely with each breath.  He denies any chest pain at rest or with exertion.  No chest pain associated with the shortness of breath.  He reports that he was active and outside all day on Saturday while working on lawnmowers and without any concerning symptoms including chest pain,  presyncope, or syncope.  Since yesterday, he has noted positional pain in his left arm that he attributes to MSK etiology after overworking this muscle/shoulder joint hauling tree branches. Positional L arm pain is described as triggered only when he lifts his arm and characterized as both dull and sharp with TTP within the shoulder joint.  No associated CP or other sx with his L arm pain.  He wonders about the contribution of RA and COVID-19 in regards to his symptoms.  He continues to monitor his BP and weight at home.  He reports his home weight fluctuates.  Home SBP is well controlled to soft with SBP at the lowest in the 90s.  Ventricular rate usually well controlled with rare escalation to 110 bpm.  Clinic t BP 130/72.  He does not drink very much water he admits.  He denies any alcohol use.  He continues to note home stressors.  He recently saw podiatrist and had something shaved off of his foot but otherwise denies any new health findings.  No signs or symptoms of bleeding.  He reports medication compliance, usually taking his medications in the morning.  He  reports CPAP compliance.  Home Medications   Current Outpatient Medications  Medication Instructions   acetaminophen (TYLENOL) 1,300 mg, Oral, Daily   albuterol (VENTOLIN HFA) 108 (90 Base) MCG/ACT inhaler Inhale into the lungs.   b complex vitamins capsule 1 capsule, Oral, Daily   cholecalciferol (VITAMIN D) 1,000 Units, Oral, Daily   diltiazem (TIAZAC) 360 MG 24 hr capsule Take 1 capsule by mouth once daily   doxycycline (VIBRAMYCIN) 100 MG capsule No dose, route, or frequency recorded.   glipiZIDE (GLUCOTROL XL) 2.5 mg, Oral, Daily with breakfast, Dr. Wynetta Emery has changed it to 5mg .   loratadine (CLARITIN) 10 mg, Oral, Daily PRN   losartan (COZAAR) 50 MG tablet Take 2 tablets by mouth once daily   magnesium oxide (MAG-OX) 250 mg, Oral, Daily   metFORMIN (GLUCOPHAGE) 1,000 mg, Oral, 2 times daily   montelukast (SINGULAIR) 10 MG tablet 1  tablet, Oral, Daily at bedtime   omeprazole (PRILOSEC) 20 MG capsule Take 1 capsule by mouth once daily   predniSONE (DELTASONE) 20 MG tablet No dose, route, or frequency recorded.   rosuvastatin (CRESTOR) 10 MG tablet TAKE 1 TABLET(10 MG) BY MOUTH DAILY   vitamin C 1,000 mg, Oral, Daily   XARELTO 20 MG TABS tablet TAKE 1 TABLET BY MOUTH ONCE DAILY WITH SUPPER     Review of Systems    He reports left arm pain that is positional and TTP, attributed to MSK etiology.  Left arm pain is not associated with chest pain and occurs only when lifting his arm after hauling branches the other day.  No chest pain.  He reports shortness of breath since COVID-19, though improving.  He reports bendopnea and shortness of breath when talking for too long, ambulating, or excited.  He denies palpitations, pnd, orthopnea, n, v, dizziness, syncope, edema,  or early satiety.  He reports fluctuating weight.   All other systems reviewed and are otherwise negative except as noted above.  Physical Exam    VS:  BP 130/72 (BP Location: Left Arm, Patient Position: Sitting, Cuff Size: Large)   Pulse 70   Ht 5\' 7"  (1.702 m)   Wt 284 lb (128.8 kg)   SpO2 98%   BMI 44.48 kg/m  , BMI Body mass index is 44.48 kg/m. GEN: Well nourished, well developed, in no acute distress. HEENT: normal. Neck: Supple, no JVD, carotid bruits, or masses. Cardiac: IRIR, 1/6 systolic murmur, rubs, or gallops. No clubbing, cyanosis, mild to moderate to 1+ LEE .  Radials/DP/PT 2+ and equal bilaterally.  Respiratory:  Respirations regular and unlabored, clear to auscultation bilaterally. GI: Soft, nontender, nondistended, BS + x 4. MS: no deformity or atrophy. Skin: warm and dry, no rash. Neuro:  Strength and sensation are intact. Psych: Normal affect.  Accessory Clinical Findings    ECG personally reviewed by me today -atrial fibrillation with ventricular rate 70bpm, RBBB (known)- no acute changes.  VITALS Reviewed today   Temp  Readings from Last 3 Encounters:  06/13/20 97.7 F (36.5 C)  03/12/20 97.9 F (36.6 C)  12/14/19 (!) 96.7 F (35.9 C) (Tympanic)   BP Readings from Last 3 Encounters:  06/13/20 (!) 110/53  03/12/20 128/80  12/28/19 (!) 150/78   Pulse Readings from Last 3 Encounters:  06/13/20 64  03/12/20 77  12/28/19 67    Wt Readings from Last 3 Encounters:  06/13/20 280 lb 12.8 oz (127.4 kg)  03/12/20 282 lb 6.4 oz (128.1 kg)  12/28/19 284 lb 8 oz (129 kg)  LABS  reviewed today   Lab Results  Component Value Date   WBC 6.9 06/13/2020   HGB 13.4 06/13/2020   HCT 40.5 06/13/2020   MCV 86.7 06/13/2020   PLT 340 06/13/2020   Lab Results  Component Value Date   CREATININE 1.13 06/13/2020   BUN 19 06/13/2020   NA 138 06/13/2020   K 4.3 06/13/2020   CL 103 06/13/2020   CO2 23 06/13/2020   Lab Results  Component Value Date   ALT 25 06/13/2020   AST 31 06/13/2020   ALKPHOS 60 06/13/2020   BILITOT 0.7 06/13/2020   Lab Results  Component Value Date   CHOL 139 05/08/2016   HDL 42 05/08/2016   LDLCALC 83 05/08/2016   TRIG 69 05/08/2016   CHOLHDL 3.3 05/08/2016    Lab Results  Component Value Date   HGBA1C 5.0 07/08/2016   Lab Results  Component Value Date   TSH 0.99 07/08/2016     STUDIES/PROCEDURES reviewed today   Echocardiogram 07/11/2015 - Left ventricle: The cavity size was mildly dilated. Wall    thickness was normal. Systolic function was normal. The estimated    ejection fraction was in the range of 55% to 60%. Left    ventricular diastolic function parameters were normal for the    patient&'s age.  - Mitral valve: There was mild regurgitation.   NM MUGA scan 05/2015 FINDINGS: The left ventricular ejection fraction is calculated equal 64.1%. Normal left ventricular wall motion and thickening.   IMPRESSION: 1. Left ventricular ejection fraction equals 64%.  Assessment & Plan    Chronic atrial fibrillation with controlled ventricular rate --No  reported tachypalpitations or presyncope/syncope in atrial fibrillation.  Given his lack of symptoms, and as previously reported, no plan for DCCV.  Rate control only.  Ventricular rate well controlled today.  Recommend weaning prednisone as tolerated, as this can elevate ventricular rate.  For ongoing rate control, continue current dose diltiazem 360 mg daily.  CHA2DS2VASc score of at least 4 (HTN, age, DM2, stroke). Continue indefinite anticoagulation to reduce the risk of thromboembolic event with Xarelto 20 mg daily.  Of note, no further hematuria noted.  No other signs or symptoms of bleeding.    Essential hypertension, goal BP less than 130/80 --Initial BP today 92/60 with repeat BP 130/72.  Home in clinic BP overall well controlled.  Continue diltiazem 360 mg daily, losartan 50 mg daily.  Wean prednisone as tolerated, as this can elevate BP.  Continue to monitor BP at home with goal BP less than 130/80.  Reviewed salt and fluid restrictions.    Mild mitral regurgitation (2017) --He reports bendopnea, dyspnea, and shortness of breath as outlined above.  He wonders the extent to which his COVID-19 infection could be contributing to the symptoms.  Also considered his cardiac etiology with recommendation for repeat echo to reassess EF, wall motion, valvular function, heart pressures.  LEE noted today with patient report that his weight has been labile at home.  Salt and fluid restrictions reviewed.  He does have a 2017 echo with mild MR noted and EF nl with mild LVE.  No echo obtained since that time.  We will order an echo today.  Wean prednisone as tolerated, as this can exacerbate volume status and lead to elevated BP/ventricular rate.  Increased activity encouraged, given sedentary lifestyle can certainly contribute to shortness of breath due to deconditioning.  He will continue to monitor his BP, weight, and ventricular rate at home.  HLD --Continue rosuvastatin 10 mg daily.  Continue to monitor  LDL per PCP.  DM2 --Ongoing glycemic control recommended for aggressive risk factor modification.  Wean prednisone as tolerated, as this can elevate glucose.  Sleep apnea on CPAP --He reports CPAP compliance.  Ongoing CPAP use encouraged.  Obesity --Ongoing diet and lifestyle changes discussed.  History of COVID-19 (02/2020) --He reports shortness of breath and dyspnea as outlined above with history of COVID-19 infection.  He follows with Dr. Raul Del.  He wonders the extent to which this contributes to his symptoms.   As above, we will order an echo today to reassess EF/valvular function/heart structure, as well as to rule out myocarditis or pericarditis s/p COVID-19.    Medication changes: None Labs ordered: None Studies / Imaging ordered: Echo  Disposition: RTC after echo  *Please be aware that the above documentation was completed voice recognition software and may contain dictation errors.       Arvil Chaco, PA-C 06/25/2020

## 2020-06-25 NOTE — Patient Instructions (Addendum)
Medication Instructions:  Your physician recommends that you continue on your current medications as directed. Please refer to the Current Medication list given to you today.  *If you need a refill on your cardiac medications before your next appointment, please call your pharmacy*   Lab Work:  None ordered   Testing/Procedures:  Your physician has requested that you have an echocardiogram. Echocardiography is a painless test that uses sound waves to create images of your heart. It provides your doctor with information about the size and shape of your heart and how well your heart's chambers and valves are working. This procedure takes approximately one hour. There are no restrictions for this procedure.    Follow-Up: At [facility], you and your health needs are our priority.  As part of our continuing mission to provide you with exceptional heart care, we have created designated Provider Care Teams.  These Care Teams include your primary Cardiologist (physician) and Advanced Practice Providers (APPs -  Physician Assistants and Nurse Practitioners) who all work together to provide you with the care you need, when you need it.  We recommend signing up for the patient portal called "MyChart".  Sign up information is provided on this After Visit Summary.  MyChart is used to connect with patients for Virtual Visits (Telemedicine).  Patients are able to view lab/test results, encounter notes, upcoming appointments, etc.  Non-urgent messages can be sent to your provider as well.   To learn more about what you can do with MyChart, go to [email or web address].    Your next appointment:    Follow up after echo  The format for your next appointment:   In Person  Provider:   You may see Kathlyn Sacramento, MD or one of the following Advanced Practice Providers on your designated Care Team:    Marrianne Mood, [place]    Other Instructions  Try to keep BP 130/80 or lower

## 2020-07-02 ENCOUNTER — Other Ambulatory Visit: Payer: Self-pay | Admitting: Cardiovascular Disease

## 2020-07-10 ENCOUNTER — Other Ambulatory Visit: Payer: Self-pay | Admitting: Cardiovascular Disease

## 2020-08-15 ENCOUNTER — Ambulatory Visit (INDEPENDENT_AMBULATORY_CARE_PROVIDER_SITE_OTHER): Payer: Medicare HMO

## 2020-08-15 ENCOUNTER — Other Ambulatory Visit: Payer: Self-pay

## 2020-08-15 DIAGNOSIS — Z8616 Personal history of COVID-19: Secondary | ICD-10-CM | POA: Diagnosis not present

## 2020-08-15 DIAGNOSIS — R0600 Dyspnea, unspecified: Secondary | ICD-10-CM | POA: Diagnosis not present

## 2020-08-15 MED ORDER — PERFLUTREN LIPID MICROSPHERE
1.0000 mL | INTRAVENOUS | Status: AC | PRN
  Administered 2020-08-15: 2 mL via INTRAVENOUS

## 2020-08-16 ENCOUNTER — Ambulatory Visit: Payer: Medicare HMO | Admitting: Internal Medicine

## 2020-08-16 LAB — ECHOCARDIOGRAM COMPLETE
AR max vel: 3.06 cm2
AV Area VTI: 2.85 cm2
AV Area mean vel: 2.77 cm2
AV Mean grad: 3.7 mmHg
AV Peak grad: 6.6 mmHg
Ao pk vel: 1.28 m/s
S' Lateral: 3.4 cm

## 2020-08-19 ENCOUNTER — Other Ambulatory Visit: Payer: Self-pay | Admitting: Cardiovascular Disease

## 2020-08-20 NOTE — Telephone Encounter (Signed)
Prescription refill request for Xarelto received.  Indication: Atrial fib Last office visit: 06/25/20  Elenor Quinones PA-C Weight: [id number] Age: 70 Scr: 1.2 on 08/02/20 CrCl: 107.33  Based on above findings Xarelto '20mg'$  daily is the appropriate dose.  Refill approved.

## 2020-08-20 NOTE — Telephone Encounter (Signed)
Refill Request.  

## 2020-08-23 ENCOUNTER — Ambulatory Visit: Payer: Medicare HMO | Admitting: Cardiovascular Disease

## 2020-08-23 ENCOUNTER — Encounter: Payer: Self-pay | Admitting: Cardiovascular Disease

## 2020-08-23 ENCOUNTER — Other Ambulatory Visit: Payer: Self-pay

## 2020-08-23 VITALS — BP 148/78 | HR 88 | Ht 67.0 in | Wt 286.1 lb

## 2020-08-23 DIAGNOSIS — G4730 Sleep apnea, unspecified: Secondary | ICD-10-CM

## 2020-08-23 DIAGNOSIS — E785 Hyperlipidemia, unspecified: Secondary | ICD-10-CM

## 2020-08-23 DIAGNOSIS — I10 Essential (primary) hypertension: Secondary | ICD-10-CM

## 2020-08-23 DIAGNOSIS — I482 Chronic atrial fibrillation, unspecified: Secondary | ICD-10-CM

## 2020-08-23 NOTE — Patient Instructions (Signed)
Medication Instructions:  No changes at this time.  *If you need a refill on your cardiac medications before your next appointment, please call your pharmacy*   Lab Work: None  If you have labs (blood work) drawn today and your tests are completely normal, you will receive your results only by: MyChart Message (if you have MyChart) OR A paper copy in the mail If you have any lab test that is abnormal or we need to change your treatment, we will call you to review the results.   Testing/Procedures: None   Follow-Up: At [facility], you and your health needs are our priority.  As part of our continuing mission to provide you with exceptional heart care, we have created designated Provider Care Teams.  These Care Teams include your primary Cardiologist (physician) and Advanced Practice Providers (APPs -  Physician Assistants and Nurse Practitioners) who all work together to provide you with the care you need, when you need it.   Your next appointment:   6 month(s)  The format for your next appointment:   In Person  Provider:   You may see Muhammad Arida, MD or one of the following Advanced Practice Providers on your designated Care Team:   Christopher Berge, NP Ryan Dunn, PA-C Jacquelyn Visser, PA-C Cadence Furth, PA-C 

## 2020-08-23 NOTE — Progress Notes (Signed)
Cardiology Office Note   Date:  08/23/2020   ID:  Mason Houston, DOB 1950/04/09, MRN [id number]  PCP:  Baxter Hire, MD  Cardiologist:   Kathlyn Sacramento, MD   Chief Complaint  Patient presents with   Other    Post echo no complaints today. Meds reviewed verbally with pt.       History of Present Illness: Mason Houston is a 70 y.o. male who presents for a followup visit regarding chronic atrial fibrillation. The patient has a long history of  hypertension and obesity. He was diagnosed with atrial fibrillation with rapid ventricular response during a routine physical  in December of 2013 in the setting of heavy caffeine intake. He also has sleep apnea currently effectively treated with CPAP.  He had a stroke in August 2015.   He was diagnosed with non-Hodgkin's lymphoma in 2017 and was treated successfully with chemotherapy. Echocardiogram In July 2017 showed normal LV systolic function.    He is being treated with rate control for atrial fibrillation given lack of symptoms.  He has been doing reasonably well with no recent chest pain or worsening dyspnea.  He did have COVID in February.  He had a recent echocardiogram which showed normal LV systolic function, normal pulmonary pressure, mildly dilated left atrium, mild mitral regurgitation and normal IVC.  His biggest issue continues to be inability to lose weight.  He is going to see a nutritionist in the near future.    Past Medical History:  Diagnosis Date   Atrial fibrillation ([place])    Cancer ([place])    Collagen vascular disease ([place])    Depression    Diabetes mellitus without complication ([place])    Dysrhythmia    Fibromyalgia    GERD (gastroesophageal reflux disease)    H/O Bell's palsy    History of German measles    Hypertension    Mini stroke ([place])    Non Hodgkin's lymphoma ([place]) 2017   Osteoarthrosis, unspecified whether generalized or localized, lower leg    Sixth cranial nerve palsy    Sleep apnea     Stroke [facility])     Past Surgical History:  Procedure Laterality Date   COLONOSCOPY  2012   Dr Candace Cruise   COLONOSCOPY WITH PROPOFOL N/A 09/09/2017   Procedure: COLONOSCOPY WITH PROPOFOL;  Surgeon: Toledo, Benay Pike, MD;  Location: [facility] ENDOSCOPY;  Service: Gastroenterology;  Laterality: N/A;   COLONOSCOPY WITH PROPOFOL N/A 09/21/2018   Procedure: COLONOSCOPY WITH PROPOFOL;  Surgeon: Toledo, Benay Pike, MD;  Location: [facility] ENDOSCOPY;  Service: Gastroenterology;  Laterality: N/A;   CYST EXCISION     ERCP N/A 12/29/2014   Procedure: ENDOSCOPIC RETROGRADE CHOLANGIOPANCREATOGRAPHY (ERCP);  Surgeon: Hulen Luster, MD;  [facility];  Service: Endoscopy;  Laterality: N/A;   ERCP N/A 02/21/2015   Procedure: ENDOSCOPIC RETROGRADE CHOLANGIOPANCREATOGRAPHY (ERCP);  Surgeon: Hulen Luster, MD;  Location: [facility] ENDOSCOPY;  Service: Gastroenterology;  Laterality: N/A;   ERCP N/A 08/14/2015   Procedure: ENDOSCOPIC RETROGRADE CHOLANGIOPANCREATOGRAPHY (ERCP) Stent removal;  Surgeon: [name], MD;  Location: [facility] ENDOSCOPY;  Service: Endoscopy;  Laterality: N/A;   PERIPHERAL VASCULAR CATHETERIZATION N/A 01/31/2015   Procedure: Glori Luis Cath Insertion;  Surgeon: Katha Cabal, MD;  Location: [place] CV LAB;  Service: Cardiovascular;  Laterality: N/A;   PERIPHERAL VASCULAR CATHETERIZATION N/A 12/18/2015   Procedure: Glori Luis Cath Removal;  Surgeon: Katha Cabal, MD;  Location: [place] CV LAB;  Service: Cardiovascular;  Laterality: N/A;   TRIGGER FINGER RELEASE  Current Outpatient Medications  Medication Sig Dispense Refill   ACETAMINOPHEN ER PO Take 1,000 mg by mouth daily.     Ascorbic Acid (VITAMIN C) 1000 MG tablet Take 1,000 mg by mouth daily.     b complex vitamins capsule Take 1 capsule by mouth daily.      cholecalciferol (VITAMIN D) 1000 units tablet Take 1,000 Units by mouth daily.     diltiazem (TIAZAC) 360 MG 24 hr capsule Take 1 capsule by mouth once daily 90 capsule 0    glipiZIDE (GLUCOTROL XL) 5 MG 24 hr tablet Take by mouth daily with breakfast. Dr. Wynetta Emery has changed it to '5mg'$ .     loratadine (CLARITIN) 10 MG tablet Take 10 mg by mouth daily as needed.      losartan (COZAAR) 50 MG tablet Take 2 tablets by mouth once daily 180 tablet 0   magnesium oxide (MAG-OX) 400 (241.3 Mg) MG tablet Take 250 mg daily by mouth.      metFORMIN (GLUCOPHAGE) 1000 MG tablet Take 1,000 mg by mouth 2 (two) times daily.     montelukast (SINGULAIR) 10 MG tablet Take 1 tablet by mouth at bedtime.     omeprazole (PRILOSEC) 20 MG capsule Take 1 capsule by mouth once daily 90 capsule 3   rivaroxaban (XARELTO) 20 MG TABS tablet TAKE 1 TABLET BY MOUTH ONCE DAILY WITH SUPPER 90 tablet 1   rosuvastatin (CRESTOR) 10 MG tablet TAKE 1 TABLET(10 MG) BY MOUTH DAILY 90 tablet 0   albuterol (VENTOLIN HFA) 108 (90 Base) MCG/ACT inhaler Inhale into the lungs. (Patient not taking: Reported on 08/23/2020)     doxycycline (VIBRAMYCIN) 100 MG capsule  (Patient not taking: Reported on 08/23/2020)     No current facility-administered medications for this visit.    Allergies:   Celebrex [celecoxib], Other, and Penicillins    Social History:  The patient  reports that he has quit smoking. His smoking use included cigarettes. He has a 67.50 pack-year smoking history. He has never used smokeless tobacco. He reports that he does not drink alcohol and does not use drugs.   Family History:  The patient's family history includes Heart attack in his father; Hypertension in his mother; Stroke in his father.    ROS:  Please see the history of present illness.   Otherwise, review of systems are positive for none.   All other systems are reviewed and negative.    PHYSICAL EXAM: VS:  BP (!) 148/78 (BP Location: Left Arm, Patient Position: Sitting, Cuff Size: Large)   Pulse 88   Ht '5\' 7"'$  (1.702 m)   Wt 286 lb 2 oz (129.8 kg)   SpO2 98%   BMI 44.81 kg/m  , BMI Body mass index is 44.81 kg/m. GEN: Well  nourished, well developed, in no acute distress  HEENT: normal  Neck: no JVD, carotid bruits, or masses Cardiac: Irregularly irregular; no murmurs, rubs, or gallops, trace leg edema  Respiratory:  clear to auscultation bilaterally, normal work of breathing GI: soft, nontender, nondistended, + BS MS: no deformity or atrophy  Skin: warm and dry, no rash Neuro:  Strength and sensation are intact Psych: euthymic mood, full affect   EKG:  EKG is not ordered today.  Recent Labs: 06/13/2020: ALT 25; BUN 19; Creatinine, Ser 1.13; Hemoglobin 13.4; Platelets 340; Potassium 4.3; Sodium 138    Lipid Panel    Component Value Date/Time   CHOL 139 05/08/2016 1152   CHOL 181 07/03/2015 1007   TRIG 69  05/08/2016 1152   HDL 42 05/08/2016 1152   HDL 44 07/03/2015 1007   CHOLHDL 3.3 05/08/2016 1152   VLDL 14 05/08/2016 1152   LDLCALC 83 05/08/2016 1152   LDLCALC 122 (H) 07/03/2015 1007      Wt Readings from Last 3 Encounters:  08/23/20 286 lb 2 oz (129.8 kg)  06/25/20 284 lb (128.8 kg)  06/13/20 280 lb 12.8 oz (127.4 kg)        ASSESSMENT AND PLAN:   1.    Chronic atrial fibrillation:  Ventricular rate is controlled with diltiazem.  He is tolerating anticoagulation with Xarelto with no side effects.  His labs in July showed stable renal function with a creatinine of 1.2.   2. Essential hypertension: Blood pressure is reasonably controlled on current medications.   3.  Hyperlipidemia: Continue treatment with rosuvastatin.Most recent lipid profile in 10/2018 showed an LDL of 53.   4. Sleep apnea: on CPAP  5. Morbid obesity: I discussed with him the importance of healthy lifestyle changes and resuming exercise.    Disposition:   FU with me in 6 months  Signed,  Kathlyn Sacramento, MD  08/23/2020 10:05 AM    [place]

## 2020-08-28 ENCOUNTER — Encounter: Payer: Self-pay | Admitting: *Deleted

## 2020-08-28 ENCOUNTER — Encounter: Payer: Medicare HMO | Attending: Internal Medicine | Admitting: *Deleted

## 2020-08-28 ENCOUNTER — Other Ambulatory Visit: Payer: Self-pay

## 2020-08-28 VITALS — BP 150/88 | Ht 67.0 in | Wt 286.9 lb

## 2020-08-28 DIAGNOSIS — E119 Type 2 diabetes mellitus without complications: Secondary | ICD-10-CM | POA: Diagnosis present

## 2020-08-28 DIAGNOSIS — E1165 Type 2 diabetes mellitus with hyperglycemia: Secondary | ICD-10-CM

## 2020-08-28 NOTE — Patient Instructions (Addendum)
Exercise:  Begin walking  for    5-10  minutes   5  days a week  and increase as tolerated  Eat 3 meals day,  1-2  snacks a day Space meals 5 hours apart Set times for each meals Limit snack foods (chips) Include 1 serving of protein with each meal Complete 3 Day Food Record and bring to next appt  Return for appointment on:   Monday October 01, 2020 at 10:30 am with [facility] (dietitian)

## 2020-08-28 NOTE — Progress Notes (Signed)
Diabetes Self-Management Education  Visit Type: First/Initial  Appt. Start Time: 1030 Appt. End Time: 1205  08/28/2020  [name], identified by name and date of birth, is a 70 y.o. male with a diagnosis of Diabetes: Type 2.   ASSESSMENT  Blood pressure (!) 150/88, height '5\' 7"'$  (1.702 m), weight 286 lb 14.4 oz (130.1 kg). Body mass index is 44.93 kg/m.   Diabetes Self-Management Education - 08/28/20 1549       Visit Information   Visit Type First/Initial      Initial Visit   Diabetes Type Type 2    Are you currently following a meal plan? No    Are you taking your medications as prescribed? No   sometimes he forgets evening dose of Metformin (2-4 x week)   Date Diagnosed 8-10 years ago      Health Coping   How would you rate your overall health? Good      Psychosocial Assessment   Patient Belief/Attitude about Diabetes Motivated to manage diabetes   "need to lose weight"   Self-care barriers None    Self-management support Doctor's office;Internet communities    Patient Concerns Nutrition/Meal planning;Weight Control    Special Needs None    Preferred Learning Style Auditory;Visual;Hands on    Learning Readiness Ready    How often do you need to have someone help you when you read instructions, pamphlets, or other written materials from your doctor or pharmacy? 1 - Never    What is the last grade level you completed in school? some college      Pre-Education Assessment   Patient understands the diabetes disease and treatment process. Needs Instruction    Patient understands incorporating nutritional management into lifestyle. Needs Instruction    Patient undertands incorporating physical activity into lifestyle. Needs Instruction    Patient understands using medications safely. Needs Instruction    Patient understands monitoring blood glucose, interpreting and using results Needs Instruction    Patient understands prevention, detection, and treatment of acute  complications. Needs Instruction    Patient understands prevention, detection, and treatment of chronic complications. Needs Instruction    Patient understands how to develop strategies to address psychosocial issues. Needs Instruction    Patient understands how to develop strategies to promote health/change behavior. Needs Instruction      Complications   Last HgB A1C per patient/outside source 8.1 %   08/02/2020   How often do you check your blood sugar? Patient declines   Blood sugar in the office was 167 mg/dL at 12:00 noon - fasting.   Have you had a dilated eye exam in the past 12 months? Yes    Have you had a dental exam in the past 12 months? No   dentures - only wear one plate   Are you checking your feet? Yes    How many days per week are you checking your feet? 7      Dietary Intake   Breakfast 9-11 am - oatmeal or cheerios    Lunch 2-3 pm breakfast bar, cottage cheese, fruit (apple, orange, grapes)    Snack (afternoon) chips    Dinner chicken, meatloaf; cheese sticks; peas, corn, beans, occasional pasta, veggies frozen (Tony), squash, brussel sprouts, lettuce, cuccumbers    Snack (evening) SF popcicle, pudding cups    Beverage(s) water, black coffee, occasional diet soda      Exercise   Exercise Type ADL's      Patient Education   Previous Diabetes Education  No    Disease state  Definition of diabetes, type 1 and 2, and the diagnosis of diabetes;Factors that contribute to the development of diabetes;Explored patient's options for treatment of their diabetes    Nutrition management  Role of diet in the treatment of diabetes and the relationship between the three main macronutrients and blood glucose level;Food label reading, portion sizes and measuring food.;Reviewed blood glucose goals for pre and post meals and how to evaluate the patients' food intake on their blood glucose level.;Meal timing in regards to the patients' current diabetes medication.    Physical  activity and exercise  Role of exercise on diabetes management, blood pressure control and cardiac health.    Medications Reviewed patients medication for diabetes, action, purpose, timing of dose and side effects.   Discussed injectable medications for weight loss   Monitoring Purpose and frequency of SMBG.;Taught/discussed recording of test results and interpretation of SMBG.;Identified appropriate SMBG and/or A1C goals.    Chronic complications Relationship between chronic complications and blood glucose control    Psychosocial adjustment Role of stress on diabetes;Identified and addressed patients feelings and concerns about diabetes      Individualized Goals (developed by patient)   Reducing Risk Other (comment)   lose weight     Outcomes   Expected Outcomes Demonstrated interest in learning. Expect positive outcomes    Future DMSE 4-6 wks             Individualized Plan for Diabetes Self-Management Training:   Learning Objective:  Patient will have a greater understanding of diabetes self-management. Patient education plan is to attend individual and/or group sessions per assessed needs and concerns.   Plan:   Patient Instructions  Exercise:  Begin walking  for    5-10  minutes   5  days a week  and increase as tolerated  Eat 3 meals day,  1-2  snacks a day Space meals 5 hours apart Set times for each meals Limit snack foods (chips) Include 1 serving of protein with each meal Complete 3 Day Food Record and bring to next appt  Return for appointment on:   Monday October 01, 2020 at 10:30 am with [facility] (dietitian)  Expected Outcomes:  Demonstrated interest in learning. Expect positive outcomes  Education material provided:  General Meal Planning Guidelines Simple Meal Plan 3 Day Food Record Plate Method (ADA)  If problems or questions, patient to contact team via:   Johny Drilling, RN, [place] [phone]  Future DSME appointment: 4-6 wks October 01, 2020 with the dietitian

## 2020-09-17 ENCOUNTER — Other Ambulatory Visit: Payer: Self-pay | Admitting: Internal Medicine

## 2020-10-01 ENCOUNTER — Other Ambulatory Visit: Payer: Self-pay | Admitting: Cardiovascular Disease

## 2020-10-01 ENCOUNTER — Encounter: Payer: Medicare HMO | Attending: Internal Medicine | Admitting: Dietician

## 2020-10-01 ENCOUNTER — Encounter: Payer: Self-pay | Admitting: Dietician

## 2020-10-01 ENCOUNTER — Other Ambulatory Visit: Payer: Self-pay

## 2020-10-01 VITALS — Ht 67.0 in | Wt 274.0 lb

## 2020-10-01 DIAGNOSIS — E1165 Type 2 diabetes mellitus with hyperglycemia: Secondary | ICD-10-CM | POA: Diagnosis present

## 2020-10-01 NOTE — Patient Instructions (Addendum)
Eat 1/2 of one avocado at a time, cut your avocado in half and fill a glass or plastic container near full with water. With the flesh-side down, place the avocado in the container, cover, and place in the fridge. This will keep the avocado from turning brown for about another two days.  Great job making healthy changes, congratulations on successful weight loss! Keep up the awesome work! Continue to eat a meal or snack every 3-5 hours during the day.  Allow 1-2 servings of healthy fat like avocado, mayo, oil-based salad dressing, or vegetable cooking oil. Stay active as much as possible -- be prepared to have a snack if you are active for 1 hour or more.

## 2020-10-01 NOTE — Progress Notes (Signed)
Diabetes Self-Management Education  Visit Type:  Follow-up  Appt. Start Time: 1030 Appt. End Time: 1140  10/01/2020  [name], identified by name and date of birth, is a 70 y.o. male with a diagnosis of Diabetes:  .   ASSESSMENT  Height 5\' 7"  (1.702 m), weight 274 lb (124.3 kg). Body mass index is 42.91 kg/m.    Diabetes Self-Management Education - 53/74/82 7078       Complications   How often do you check your blood sugar? Patient declines    Have you had a dilated eye exam in the past 12 months? Yes    Have you had a dental exam in the past 12 months? No    Are you checking your feet? Yes    How many days per week are you checking your feet? 7      Dietary Intake   Breakfast 3 meals and 1-2 snacks daily      Exercise   Exercise Type ADL's   yard work 2-3 times a week     Patient Education   Nutrition management  Food label reading, portion sizes and measuring food.;Role of diet in the treatment of diabetes and the relationship between the three main macronutrients and blood glucose level;Meal timing in regards to the patients' current diabetes medication.;Meal options for control of blood glucose level and chronic complications.;Other (comment)   basic meal planning for weight loss   Physical activity and exercise  Role of exercise on diabetes management, blood pressure control and cardiac health.    Monitoring Interpreting lab values - A1C, lipid, urine microalbumina.;Identified appropriate SMBG and/or A1C goals.      Post-Education Assessment   Patient understands the diabetes disease and treatment process. Demonstrates understanding / competency    Patient understands incorporating nutritional management into lifestyle. Demonstrates understanding / competency    Patient undertands incorporating physical activity into lifestyle. Demonstrates understanding / competency    Patient understands using medications safely. Demonstrates understanding / competency     Patient understands monitoring blood glucose, interpreting and using results Needs Review    Patient understands prevention, detection, and treatment of acute complications. Needs Review    Patient understands prevention, detection, and treatment of chronic complications. Demonstrates understanding / competency    Patient understands how to develop strategies to address psychosocial issues. Needs Review    Patient understands how to develop strategies to promote health/change behavior. Demonstrates understanding / competency      Outcomes   Program Status Completed             Learning Objective:  Patient will have a greater understanding of diabetes self-management. Patient education plan is to attend individual and/or group sessions per assessed needs and concerns.  Additional Notes: Patient has lost almost 13lbs since previous visit on 08/28/20. He has reduced food portions, controlled carb intake, and reduced fat in foods and added.  He is unable to chew nuts and other hard foods due to denture plate (upper); no lower teeth or dentures.  He remains active with yardwork, push-mows large yard which takes 2-3 days to complete. Tested BG during visit today, 11:35am, result was 102mg /dl.   Plan:   Patient Instructions  Eat 1/2 of one avocado at a time, cut your avocado in half and fill a glass or plastic container near full with water. With the flesh-side down, place the avocado in the container, cover, and place in the fridge. This will keep the avocado from turning brown for about  another two days.  Great job making healthy changes, congratulations on successful weight loss! Keep up the awesome work! Continue to eat a meal or snack every 3-5 hours during the day.  Allow 1-2 servings of healthy fat like avocado, mayo, oil-based salad dressing, or vegetable cooking oil. Stay active as much as possible -- be prepared to have a snack if you are active for 1 hour or more.   Expected  Outcomes:  Demonstrated interest in learning. Expect positive outcomes   If problems or questions, patient to contact team via:  Phone

## 2020-10-08 ENCOUNTER — Other Ambulatory Visit: Payer: Self-pay | Admitting: Cardiovascular Disease

## 2020-12-13 ENCOUNTER — Inpatient Hospital Stay: Payer: Medicare HMO | Attending: Internal Medicine

## 2020-12-13 ENCOUNTER — Encounter: Payer: Self-pay | Admitting: Internal Medicine

## 2020-12-13 ENCOUNTER — Other Ambulatory Visit: Payer: Self-pay

## 2020-12-13 ENCOUNTER — Inpatient Hospital Stay (HOSPITAL_BASED_OUTPATIENT_CLINIC_OR_DEPARTMENT_OTHER): Payer: Medicare HMO | Admitting: Internal Medicine

## 2020-12-13 DIAGNOSIS — C8338 Diffuse large B-cell lymphoma, lymph nodes of multiple sites: Secondary | ICD-10-CM | POA: Insufficient documentation

## 2020-12-13 DIAGNOSIS — Z7901 Long term (current) use of anticoagulants: Secondary | ICD-10-CM | POA: Diagnosis not present

## 2020-12-13 DIAGNOSIS — E119 Type 2 diabetes mellitus without complications: Secondary | ICD-10-CM | POA: Diagnosis not present

## 2020-12-13 DIAGNOSIS — Z79899 Other long term (current) drug therapy: Secondary | ICD-10-CM | POA: Insufficient documentation

## 2020-12-13 DIAGNOSIS — Z7984 Long term (current) use of oral hypoglycemic drugs: Secondary | ICD-10-CM | POA: Diagnosis not present

## 2020-12-13 DIAGNOSIS — I480 Paroxysmal atrial fibrillation: Secondary | ICD-10-CM | POA: Insufficient documentation

## 2020-12-13 DIAGNOSIS — C8333 Diffuse large B-cell lymphoma, intra-abdominal lymph nodes: Secondary | ICD-10-CM

## 2020-12-13 LAB — CBC WITH DIFFERENTIAL/PLATELET
Abs Immature Granulocytes: 0.02 10*3/uL (ref 0.00–0.07)
Basophils Absolute: 0.1 10*3/uL (ref 0.0–0.1)
Basophils Relative: 1 %
Eosinophils Absolute: 0.3 10*3/uL (ref 0.0–0.5)
Eosinophils Relative: 3 %
HCT: 41.7 % (ref 39.0–52.0)
Hemoglobin: 13.7 g/dL (ref 13.0–17.0)
Immature Granulocytes: 0 %
Lymphocytes Relative: 22 %
Lymphs Abs: 1.7 10*3/uL (ref 0.7–4.0)
MCH: 28.1 pg (ref 26.0–34.0)
MCHC: 32.9 g/dL (ref 30.0–36.0)
MCV: 85.5 fL (ref 80.0–100.0)
Monocytes Absolute: 0.8 10*3/uL (ref 0.1–1.0)
Monocytes Relative: 10 %
Neutro Abs: 4.8 10*3/uL (ref 1.7–7.7)
Neutrophils Relative %: 64 %
Platelets: 335 10*3/uL (ref 150–400)
RBC: 4.88 MIL/uL (ref 4.22–5.81)
RDW: 13.2 % (ref 11.5–15.5)
WBC: 7.6 10*3/uL (ref 4.0–10.5)
nRBC: 0 % (ref 0.0–0.2)

## 2020-12-13 LAB — COMPREHENSIVE METABOLIC PANEL
ALT: 25 U/L (ref 0–44)
AST: 30 U/L (ref 15–41)
Albumin: 4 g/dL (ref 3.5–5.0)
Alkaline Phosphatase: 66 U/L (ref 38–126)
Anion gap: 11 (ref 5–15)
BUN: 18 mg/dL (ref 8–23)
CO2: 28 mmol/L (ref 22–32)
Calcium: 9.4 mg/dL (ref 8.9–10.3)
Chloride: 101 mmol/L (ref 98–111)
Creatinine, Ser: 1.06 mg/dL (ref 0.61–1.24)
GFR, Estimated: >60 mL/min (ref >60)
Glucose, Bld: 128 mg/dL — ABNORMAL HIGH (ref 70–99)
Potassium: 4.9 mmol/L (ref 3.5–5.1)
Sodium: 140 mmol/L (ref 135–145)
Total Bilirubin: 0.7 mg/dL (ref 0.3–1.2)
Total Protein: 7.2 g/dL (ref 6.5–8.1)

## 2020-12-13 LAB — LACTATE DEHYDROGENASE: LDH: 131 U/L (ref 98–192)

## 2020-12-13 NOTE — Progress Notes (Signed)
Pt in for follow up, states doing well and denies any concerns.

## 2020-12-13 NOTE — Progress Notes (Signed)
[place] OFFICE PROGRESS NOTE  Patient Care Team: Baxter Hire, MD as PCP - General (Internal Medicine) Wellington Hampshire, MD as PCP - Cardiology (Cardiology) [name], MD (Infectious Diseases) Wellington Hampshire, MD as Consulting Physician (Cardiology) Bary Castilla Forest Gleason, MD (General Surgery) Roselee Nova, MD as Referring Physician [facility] Medicine)   Cancer Staging  No matching staging information was found for the patient.   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 Toledo]  --------------------------------------------------------   DIAGNOSIS: [Jan 2017 ]- DLBCL STAGE: IE  ;GOALS: CURATIVE CURRENT/MOST RECENT THERAPY [finished April 2017- R-CHOP X5]-surveillance    Diffuse large b-cell lymphoma, intra-abdominal lymph nodes ([place])      INTERVAL HISTORY: Alone.  Ambulating independently.  [place] Huntsman 70 y.o.  male pleasant patient above history of diffuse large B-cell lymphoma is here for follow-up.  Patient has been very diligent with his diet.  Has lost some weight.  He has been trying to be physically active.  However limited to arthritis.  Denies any blood in stools or black-colored stools but denies any blood in urine.   Review of Systems  Constitutional:  Negative for chills, diaphoresis, fever, malaise/fatigue and weight loss.  HENT:  Negative for nosebleeds and sore throat.    Eyes:  Negative for double vision.  Respiratory:  Negative for cough, hemoptysis, sputum production, shortness of breath and wheezing.   Cardiovascular:  Negative for chest pain, palpitations, orthopnea and leg swelling.  Gastrointestinal:  Negative for abdominal pain, blood in stool, constipation, diarrhea, heartburn, melena, nausea and vomiting.  Genitourinary:  Negative for dysuria, frequency and urgency.  Musculoskeletal:  Negative for back pain and joint pain.  Skin: Negative.  Negative for itching and rash.  Neurological:  Negative for tingling, focal weakness, weakness and headaches.  Endo/Heme/Allergies:  Does not bruise/bleed easily.  Psychiatric/Behavioral:  Negative for depression. The patient is nervous/anxious. The patient does not have insomnia.      PAST MEDICAL HISTORY :  Past Medical History:  Diagnosis Date   Atrial fibrillation ([place])    Cancer ([place])    Collagen vascular disease ([place])    Depression    Diabetes mellitus without complication ([place])    Dysrhythmia    Fibromyalgia    GERD (gastroesophageal reflux disease)    H/O Bell's palsy    History of German measles    Hypertension    Mini stroke    Non Hodgkin's lymphoma ([place]) 2017   Osteoarthrosis, unspecified whether generalized or localized, lower leg    Sixth cranial nerve palsy    Sleep apnea    Stroke [facility])     PAST SURGICAL HISTORY :   Past Surgical History:  Procedure Laterality Date   COLONOSCOPY  2012   Dr Candace Cruise   COLONOSCOPY WITH PROPOFOL N/A 09/09/2017   Procedure: COLONOSCOPY WITH PROPOFOL;  Surgeon: Toledo, Benay Pike, MD;  Location: [facility] ENDOSCOPY;  Service: Gastroenterology;  Laterality: N/A;   COLONOSCOPY WITH  PROPOFOL N/A 09/21/2018   Procedure: COLONOSCOPY WITH PROPOFOL;  Surgeon: Toledo, Benay Pike, MD;  Location: [facility] ENDOSCOPY;  Service: Gastroenterology;  Laterality: N/A;   CYST EXCISION     ERCP N/A 12/29/2014   Procedure: ENDOSCOPIC RETROGRADE CHOLANGIOPANCREATOGRAPHY (ERCP);  Surgeon:  Hulen Luster, MD;  Location: [facility] ENDOSCOPY;  Service: Endoscopy;  Laterality: N/A;   ERCP N/A 02/21/2015   Procedure: ENDOSCOPIC RETROGRADE CHOLANGIOPANCREATOGRAPHY (ERCP);  Surgeon: Hulen Luster, MD;  Location: [facility] ENDOSCOPY;  Service: Gastroenterology;  Laterality: N/A;   ERCP N/A 08/14/2015   Procedure: ENDOSCOPIC RETROGRADE CHOLANGIOPANCREATOGRAPHY (ERCP) Stent removal;  Surgeon: [name], MD;  Location: [facility] ENDOSCOPY;  Service: Endoscopy;  Laterality: N/A;   PERIPHERAL VASCULAR CATHETERIZATION N/A 01/31/2015   Procedure: Glori Luis Cath Insertion;  Surgeon: Katha Cabal, MD;  Location: [facility] CV LAB;  Service: Cardiovascular;  Laterality: N/A;   PERIPHERAL VASCULAR CATHETERIZATION N/A 12/18/2015   Procedure: Glori Luis Cath Removal;  Surgeon: Katha Cabal, MD;  Location: [facility] CV LAB;  Service: Cardiovascular;  Laterality: N/A;   TRIGGER FINGER RELEASE      FAMILY HISTORY :   Family History  Problem Relation Age of Onset   Hypertension Mother    Heart attack Father    Stroke Father    Diabetes Paternal Grandmother    Diabetes Paternal Grandfather     SOCIAL HISTORY:   Social History   Tobacco Use   Smoking status: Former    Packs/day: 4.50    Years: 15.00    Pack years: 67.50    Types: Cigarettes    Quit date: 01/06/1981    Years since quitting: 39.9   Smokeless tobacco: Never  Vaping Use   Vaping Use: Never used  Substance Use Topics   Alcohol use: No   Drug use: No    Types: Marijuana    Comment: PAST    ALLERGIES:  is allergic to celebrex [celecoxib], other, and penicillins.  MEDICATIONS:  Current Outpatient Medications  Medication Sig Dispense Refill   ACETAMINOPHEN ER PO Take 1,000 mg by mouth daily.     Ascorbic Acid (VITAMIN C) 1000 MG tablet Take 1,000 mg by mouth daily.     b complex vitamins capsule Take 1 capsule by mouth daily.      cholecalciferol (VITAMIN D) 1000 units tablet Take 1,000 Units by mouth daily.     diltiazem (TIAZAC) 360 MG  24 hr capsule Take 1 capsule by mouth once daily 90 capsule 3   glipiZIDE (GLUCOTROL XL) 5 MG 24 hr tablet Take by mouth daily with breakfast. Dr. Wynetta Emery has changed it to 5mg .     loratadine (CLARITIN) 10 MG tablet Take 10 mg by mouth daily as needed.      losartan (COZAAR) 50 MG tablet Take 2 tablets by mouth once daily 180 tablet 0   magnesium oxide (MAG-OX) 400 (241.3 Mg) MG tablet Take 250 mg daily by mouth.      metFORMIN (GLUCOPHAGE) 1000 MG tablet Take 1,000 mg by mouth 2 (two) times daily.     montelukast (SINGULAIR) 10 MG tablet Take 1 tablet by mouth at bedtime.     omeprazole (PRILOSEC) 20 MG capsule Take 1 capsule by mouth once daily 90 capsule 0   rivaroxaban (XARELTO) 20 MG TABS tablet TAKE 1 TABLET BY MOUTH ONCE DAILY WITH SUPPER 90 tablet 1   rosuvastatin (CRESTOR) 10 MG tablet TAKE 1 TABLET(10 MG) BY MOUTH DAILY 90 tablet 0   No current facility-administered medications for this  visit.    PHYSICAL EXAMINATION: ECOG PERFORMANCE STATUS: 0 - Asymptomatic  BP 126/90 (BP Location: Left Arm, Patient Position: Sitting)   Pulse 77   Temp 97.6 F (36.4 C) (Tympanic)   Resp 16   Wt 276 lb (125.2 kg)   SpO2 100%   BMI 43.23 kg/m   Filed Weights   12/13/20 1047 12/13/20 1101  Weight: 276 lb (125.2 kg) 276 lb (125.2 kg)    Physical Exam HENT:     Head: Normocephalic and atraumatic.     Mouth/Throat:     Pharynx: No oropharyngeal exudate.  Eyes:     Pupils: Pupils are equal, round, and reactive to light.  Cardiovascular:     Rate and Rhythm: Normal rate and regular rhythm.  Pulmonary:     Effort: No respiratory distress.     Breath sounds: No wheezing.  Abdominal:     General: Bowel sounds are normal. There is no distension.     Palpations: Abdomen is soft. There is no mass.     Tenderness: There is no abdominal tenderness. There is no guarding or rebound.  Musculoskeletal:        General: No tenderness. Normal range of motion.     Cervical back: Normal range of  motion and neck supple.  Skin:    General: Skin is warm.  Neurological:     Mental Status: He is alert and oriented to person, place, and time.  Psychiatric:        Mood and Affect: Affect normal.       LABORATORY DATA:  I have reviewed the data as listed    Component Value Date/Time   NA 140 12/13/2020 1014   NA 142 05/15/2015 1156   NA 139 12/19/2011 1044   K 4.9 12/13/2020 1014   K 4.1 12/19/2011 1044   CL 101 12/13/2020 1014   CL 106 12/19/2011 1044   CO2 28 12/13/2020 1014   CO2 26 12/19/2011 1044   GLUCOSE 128 (H) 12/13/2020 1014   GLUCOSE 95 12/19/2011 1044   BUN 18 12/13/2020 1014   BUN 16 05/15/2015 1156   BUN 18 12/19/2011 1044   CREATININE 1.06 12/13/2020 1014   CREATININE 0.88 05/08/2016 1152   CALCIUM 9.4 12/13/2020 1014   CALCIUM 9.0 12/19/2011 1044   PROT 7.2 12/13/2020 1014   PROT 6.1 05/15/2015 1156   PROT 9.0 (H) 12/19/2011 1044   ALBUMIN 4.0 12/13/2020 1014   ALBUMIN 3.7 05/15/2015 1156   ALBUMIN 4.4 12/19/2011 1044   AST 30 12/13/2020 1014   AST 44 (H) 12/19/2011 1044   ALT 25 12/13/2020 1014   ALT 46 12/19/2011 1044   ALKPHOS 66 12/13/2020 1014   ALKPHOS 110 12/19/2011 1044   BILITOT 0.7 12/13/2020 1014   BILITOT 0.2 05/15/2015 1156   BILITOT 0.7 12/19/2011 1044   GFRNONAA >60 12/13/2020 1014   GFRNONAA >89 05/08/2016 1152   GFRAA >60 06/15/2019 0948   GFRAA >89 05/08/2016 1152    No results found for: SPEP, UPEP  Lab Results  Component Value Date   WBC 7.6 12/13/2020   NEUTROABS 4.8 12/13/2020   HGB 13.7 12/13/2020   HCT 41.7 12/13/2020   MCV 85.5 12/13/2020   PLT 335 12/13/2020      Chemistry      Component Value Date/Time   NA 140 12/13/2020 1014   NA 142 05/15/2015 1156   NA 139 12/19/2011 1044   K 4.9 12/13/2020 1014   K 4.1 12/19/2011 1044  CL 101 12/13/2020 1014   CL 106 12/19/2011 1044   CO2 28 12/13/2020 1014   CO2 26 12/19/2011 1044   BUN 18 12/13/2020 1014   BUN 16 05/15/2015 1156   BUN 18 12/19/2011  1044   CREATININE 1.06 12/13/2020 1014   CREATININE 0.88 05/08/2016 1152      Component Value Date/Time   CALCIUM 9.4 12/13/2020 1014   CALCIUM 9.0 12/19/2011 1044   ALKPHOS 66 12/13/2020 1014   ALKPHOS 110 12/19/2011 1044   AST 30 12/13/2020 1014   AST 44 (H) 12/19/2011 1044   ALT 25 12/13/2020 1014   ALT 46 12/19/2011 1044   BILITOT 0.7 12/13/2020 1014   BILITOT 0.2 05/15/2015 1156   BILITOT 0.7 12/19/2011 1044       RADIOGRAPHIC STUDIES: I have personally reviewed the radiological images as listed and agreed with the findings in the report. No results found.   ASSESSMENT & PLAN:  Diffuse large b-cell lymphoma, intra-abdominal lymph nodes (H[facility]) # DLBCL of pancreatic head-  Stage IE. S/p R-CHOP [finished 2017 April].  STABLE  Currently no evidence of disease; monitor clinically.  Labs within normal limits.  Discussed with patient that is likely cured.  We will continue follow-up during imaging.  # DM- FBS-128;better- continue metformin/Glucotrol; discussed regarding dietary interventions /reducing carbohydrates.  Repeat DEC 2022- 7.3.  Continue close follow-up with PCP.  # Paroxysmal A.fib- on xarelto-[Dr.Arida] stable no bleeding.STABLE   # DISPOSITION:  # Follow up in 6 months -MD-labs-cbc/cmp/dh-Dr.B.     Orders Placed This Encounter  Procedures   CBC with Differential/Platelet    Standing Status:   Future    Standing Expiration Date:   12/13/2021   Comprehensive metabolic panel    Standing Status:   Future    Standing Expiration Date:   12/13/2021   Lactate dehydrogenase    Standing Status:   Future    Standing Expiration Date:   12/13/2021   All questions were answered. The patient knows to call the clinic with any problems, questions or concerns.      Cammie Sickle, MD 12/13/2020 1:17 PM

## 2020-12-13 NOTE — Assessment & Plan Note (Addendum)
#   DLBCL of pancreatic head-  Stage IE. S/p R-CHOP [finished 2017 April].  STABLE  Currently no evidence of disease; monitor clinically.  Labs within normal limits.  Discussed with patient that is likely cured.  We will continue follow-up during imaging.  # DM- FBS-128;better- continue metformin/Glucotrol; discussed regarding dietary interventions /reducing carbohydrates.  Repeat DEC 2022- 7.3.  Continue close follow-up with PCP.  # Paroxysmal A.fib- on xarelto-[Dr.Arida] stable no bleeding.STABLE   # DISPOSITION:  # Follow up in 6 months -MD-labs-cbc/cmp/dh-Dr.B.

## 2020-12-20 ENCOUNTER — Other Ambulatory Visit: Payer: Self-pay | Admitting: Internal Medicine

## 2021-01-10 ENCOUNTER — Other Ambulatory Visit: Payer: Self-pay | Admitting: Cardiovascular Disease

## 2021-02-19 ENCOUNTER — Other Ambulatory Visit: Payer: Self-pay | Admitting: Cardiovascular Disease

## 2021-02-20 ENCOUNTER — Ambulatory Visit (INDEPENDENT_AMBULATORY_CARE_PROVIDER_SITE_OTHER): Payer: Medicare HMO

## 2021-02-20 ENCOUNTER — Other Ambulatory Visit: Payer: Self-pay

## 2021-02-20 DIAGNOSIS — G4733 Obstructive sleep apnea (adult) (pediatric): Secondary | ICD-10-CM

## 2021-02-20 NOTE — Progress Notes (Signed)
95 percentile pressure 8   95th percentile leak 25.9   apnea index 3.3 /hr  apnea-hypopnea index  4.3 /hr   total days used  >4 hr 55 days  total days used <4 hr 2 days  Total compliance 92  percent  He is doing great no problems or questions at this time   Pt was seen by Claiborne Billings  RRT/RCP  from [facility]

## 2021-02-20 NOTE — Telephone Encounter (Signed)
Refill Request.  

## 2021-02-20 NOTE — Telephone Encounter (Signed)
Xarelto 20 mg refill request received. Pt is 71 years old, weight- 125.2 kg, Crea- 1.06 on 12/13/20, last seen by Dr. Fletcher Anon on 08/23/20, Diagnosis-afib, CrCl- 116.47; Dose is appropriate based on dosing criteria. Will send in refill to requested pharmacy.

## 2021-02-20 NOTE — Telephone Encounter (Signed)
Prescription refill request for Xarelto received.  Indication: Atrial fib Last office visit: 08/23/20  Rod Can MD Weight: 129.8kg Age: 72 Scr: 1.06 on 12/13/20 CrCl: 120.75  Based on above findings Xarelto 20mg  daily is the appropriate dose.  Refills approved.

## 2021-02-27 ENCOUNTER — Ambulatory Visit: Payer: Self-pay

## 2021-03-11 ENCOUNTER — Other Ambulatory Visit: Payer: Self-pay

## 2021-03-11 ENCOUNTER — Ambulatory Visit: Payer: Medicare HMO | Admitting: Physician Assistant

## 2021-03-11 ENCOUNTER — Encounter: Payer: Self-pay | Admitting: Physician Assistant

## 2021-03-11 DIAGNOSIS — G4733 Obstructive sleep apnea (adult) (pediatric): Secondary | ICD-10-CM

## 2021-03-11 DIAGNOSIS — Z7189 Other specified counseling: Secondary | ICD-10-CM | POA: Diagnosis not present

## 2021-03-11 DIAGNOSIS — I10 Essential (primary) hypertension: Secondary | ICD-10-CM | POA: Diagnosis not present

## 2021-03-11 DIAGNOSIS — Z6841 Body Mass Index (BMI) 40.0 and over, adult: Secondary | ICD-10-CM

## 2021-03-11 NOTE — Progress Notes (Signed)
[facility] [address] [place], [place] [address]  Pulmonary Sleep Medicine   Office Visit Note  Patient Name: Mason Houston DOB: [birth year] MRN [id number]  Date of Service: 03/11/2021  Complaints/HPI: pt is here for a pulmonology follow up for OSA.  Reviewed download which shows excellent compliance at 92%.  AHI also well controlled at 4.3/h.  Patient states he is having no problem with CPAP and reports benefit from use.  Denies headaches, dryness, or shortness of breath. Does have quite a bit of stress in his life.  His blood pressure was initially elevated in office due to patient stating that he gets anxious and was stressing over something in his personal life on the way over to appointment.  Advised to follow-up with PCP regarding anxiety and stress--states this is normal for him.  He is additionally followed by pulmonology and has been working with a nutritionist to work on weight loss.  ROS  General: (-) fever, (-) chills, (-) night sweats, (-) weakness Skin: (-) rashes, (-) itching,. Eyes: (-) visual changes, (-) redness, (-) itching. Nose and Sinuses: (-) nasal stuffiness or itchiness, (-) postnasal drip, (-) nosebleeds, (-) sinus trouble. Mouth and Throat: (-) sore throat, (-) hoarseness. Neck: (-) swollen glands, (-) enlarged thyroid, (-) neck pain. Respiratory: - cough, (-) bloody sputum, - shortness of breath, - wheezing. Cardiovascular: - ankle swelling, (-) chest pain. Lymphatic: (-) lymph node enlargement. Neurologic: (-) numbness, (-) tingling. Psychiatric: (-) anxiety, (-) depression   Current Medication: Outpatient Encounter Medications as of 03/11/2021  Medication Sig   ACETAMINOPHEN ER PO Take 1,000 mg by mouth daily.   Ascorbic Acid (VITAMIN C) 1000 MG tablet Take 1,000 mg by mouth daily.   b complex vitamins capsule Take 1 capsule by mouth daily.    cholecalciferol (VITAMIN D) 1000 units tablet Take 1,000 Units by mouth daily.   diltiazem  (TIAZAC) 360 MG 24 hr capsule Take 1 capsule by mouth once daily   glipiZIDE (GLUCOTROL XL) 5 MG 24 hr tablet Take by mouth daily with breakfast. Dr. Laural Benes has changed it to 5mg .   loratadine (CLARITIN) 10 MG tablet Take 10 mg by mouth daily as needed.    losartan (COZAAR) 50 MG tablet Take 2 tablets by mouth once daily   magnesium oxide (MAG-OX) 400 (241.3 Mg) MG tablet Take 250 mg daily by mouth.    metFORMIN (GLUCOPHAGE) 1000 MG tablet Take 1,000 mg by mouth 2 (two) times daily.   montelukast (SINGULAIR) 10 MG tablet Take 1 tablet by mouth at bedtime.   omeprazole (PRILOSEC) 20 MG capsule Take 1 capsule by mouth once daily   rivaroxaban (XARELTO) 20 MG TABS tablet TAKE 1 TABLET BY MOUTH ONCE DAILY WITH SUPPER   rosuvastatin (CRESTOR) 10 MG tablet TAKE 1 TABLET(10 MG) BY MOUTH DAILY   No facility-administered encounter medications on file as of 03/11/2021.    Surgical History: Past Surgical History:  Procedure Laterality Date   COLONOSCOPY  2012   Dr Bluford Kaufmann   COLONOSCOPY WITH PROPOFOL N/A 09/09/2017   Procedure: COLONOSCOPY WITH PROPOFOL;  Surgeon: Toledo, Boykin Nearing, MD;  Location: [facility] ENDOSCOPY;  Service: Gastroenterology;  Laterality: N/A;   COLONOSCOPY WITH PROPOFOL N/A 09/21/2018   Procedure: COLONOSCOPY WITH PROPOFOL;  Surgeon: Toledo, Boykin Nearing, MD;  Location: [facility] ENDOSCOPY;  Service: Gastroenterology;  Laterality: N/A;   CYST EXCISION     ERCP N/A 12/29/2014   Procedure: ENDOSCOPIC RETROGRADE CHOLANGIOPANCREATOGRAPHY (ERCP);  Surgeon: Wallace Cullens, MD;  Location: [facility] ENDOSCOPY;  Service: Endoscopy;  Laterality: N/A;   ERCP N/A 02/21/2015   Procedure: ENDOSCOPIC RETROGRADE CHOLANGIOPANCREATOGRAPHY (ERCP);  Surgeon: Wallace Cullens, MD;  Location: [facility] ENDOSCOPY;  Service: Gastroenterology;  Laterality: N/A;   ERCP N/A 08/14/2015   Procedure: ENDOSCOPIC RETROGRADE CHOLANGIOPANCREATOGRAPHY (ERCP) Stent removal;  Surgeon: Midge Minium, MD;  Location: [facility] ENDOSCOPY;  Service: Endoscopy;  Laterality:  N/A;   PERIPHERAL VASCULAR CATHETERIZATION N/A 01/31/2015   Procedure: Shelda Pal Cath Insertion;  Surgeon: Renford Dills, MD;  Location: [facility] INVASIVE CV LAB;  Service: Cardiovascular;  Laterality: N/A;   PERIPHERAL VASCULAR CATHETERIZATION N/A 12/18/2015   Procedure: Shelda Pal Cath Removal;  Surgeon: Renford Dills, MD;  Location: [facility] INVASIVE CV LAB;  Service: Cardiovascular;  Laterality: N/A;   TRIGGER FINGER RELEASE      Medical History: Past Medical History:  Diagnosis Date   Atrial fibrillation (HCC)    Cancer (HCC)    Collagen vascular disease (HCC)    Depression    Diabetes mellitus without complication (HCC)    Dysrhythmia    Fibromyalgia    GERD (gastroesophageal reflux disease)    H/O Bell's palsy    History of German measles    Hypertension    Mini stroke    Non Hodgkin's lymphoma (HCC) 2017   Osteoarthrosis, unspecified whether generalized or localized, lower leg    Sixth cranial nerve palsy    Sleep apnea    Stroke (HCC)     Family History: Family History  Problem Relation Age of Onset   Hypertension Mother    Heart attack Father    Stroke Father    Diabetes Paternal Grandmother    Diabetes Paternal Grandfather     Social History: Social History   Socioeconomic History   Marital status: Married    Spouse name: Not on file   Number of children: Not on file   Years of education: Not on file   Highest education level: Not on file  Occupational History   Not on file  Tobacco Use   Smoking status: Former    Packs/day: 4.50    Years: 15.00    Pack years: 67.50    Types: Cigarettes    Quit date: 01/06/1981    Years since quitting: 40.2   Smokeless tobacco: Never  Vaping Use   Vaping Use: Never used  Substance and Sexual Activity   Alcohol use: No   Drug use: No    Types: Marijuana    Comment: PAST   Sexual activity: Not on file  Other Topics Concern   Not on file  Social History Narrative   Not on file   Social Determinants of Health    Financial Resource Strain: Not on file  Food Insecurity: Not on file  Transportation Needs: Not on file  Physical Activity: Not on file  Stress: Not on file  Social Connections: Not on file  Intimate Partner Violence: Not on file    Vital Signs: Blood pressure 138/88, pulse 87, temperature 98.3 F (36.8 C), resp. rate 16, height 5\' 7"  (1.702 m), weight 278 lb 3.2 oz (126.2 kg), SpO2 95 %.  Examination: General Appearance: The patient is well-developed, well-nourished, and in no distress. Skin: Gross inspection of skin unremarkable. Head: normocephalic, no gross deformities. Eyes: no gross deformities noted. ENT: ears appear grossly normal no exudates. Neck: Supple. No thyromegaly. No LAD. Respiratory: Lungs clear to auscultation bilaterally. Cardiovascular: Normal S1 and S2 without murmur or rub. Extremities: No cyanosis. pulses are equal. Neurologic: Alert and oriented. No  involuntary movements.  LABS: Recent Results (from the past 2160 hour(s))  Lactate dehydrogenase     Status: None   Collection Time: 12/13/20 10:14 AM  Result Value Ref Range   LDH 131 98 - 192 U/L    Comment: Performed at [facility], [address] [place], [place] [address]  Comprehensive metabolic panel     Status: Abnormal   Collection Time: 12/13/20 10:14 AM  Result Value Ref Range   Sodium 140 135 - 145 mmol/L   Potassium 4.9 3.5 - 5.1 mmol/L   Chloride 101 98 - 111 mmol/L   CO2 28 22 - 32 mmol/L   Glucose, Bld 128 (H) 70 - 99 mg/dL    Comment: Glucose reference range applies only to samples taken after fasting for at least 8 hours.   BUN 18 8 - 23 mg/dL   Creatinine, Ser 8.41 0.61 - 1.24 mg/dL   Calcium 9.4 8.9 - 32.4 mg/dL   Total Protein 7.2 6.5 - 8.1 g/dL   Albumin 4.0 3.5 - 5.0 g/dL   AST 30 15 - 41 U/L   ALT 25 0 - 44 U/L   Alkaline Phosphatase 66 38 - 126 U/L   Total Bilirubin 0.7 0.3 - 1.2 mg/dL   GFR, Estimated >40 >10 mL/min    Comment: (NOTE) Calculated using the  CKD-EPI Creatinine Equation (2021)    Anion gap 11 5 - 15    Comment: Performed at [facility], [address] [place], [place] [address]  CBC with Differential/Platelet     Status: None   Collection Time: 12/13/20 10:14 AM  Result Value Ref Range   WBC 7.6 4.0 - 10.5 K/uL   RBC 4.88 4.22 - 5.81 MIL/uL   Hemoglobin 13.7 13.0 - 17.0 g/dL   HCT 66.4 40.3 - 47.4 %   MCV 85.5 80.0 - 100.0 fL   MCH 28.1 26.0 - 34.0 pg   MCHC 32.9 30.0 - 36.0 g/dL   RDW 25.9 56.3 - 87.5 %   Platelets 335 150 - 400 K/uL   nRBC 0.0 0.0 - 0.2 %   Neutrophils Relative % 64 %   Neutro Abs 4.8 1.7 - 7.7 K/uL   Lymphocytes Relative 22 %   Lymphs Abs 1.7 0.7 - 4.0 K/uL   Monocytes Relative 10 %   Monocytes Absolute 0.8 0.1 - 1.0 K/uL   Eosinophils Relative 3 %   Eosinophils Absolute 0.3 0.0 - 0.5 K/uL   Basophils Relative 1 %   Basophils Absolute 0.1 0.0 - 0.1 K/uL   Immature Granulocytes 0 %   Abs Immature Granulocytes 0.02 0.00 - 0.07 K/uL    Comment: Performed at [facility], [address] [place], [place] [address]    Radiology: No results found.  No results found.  No results found.    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   Localized skin mass, lump, or swelling 11/09/2015   Internal nasal lesion 10/30/2015   Fitting and adjustment of gastrointestinal appliance and device    Disease of biliary tract    History of stroke 07/25/2015   Annual physical exam 07/03/2015   Pneumonia 05/15/2015   Fever 05/15/2015   Hypoxia 05/07/2015   Pancreatic mass 01/12/2015   Obstructive jaundice 12/28/2014   Arthritis 06/09/2014   Benign essential HTN 10/04/2013   Cerebellar infarction (HCC) 10/04/2013  Acid reflux 10/04/2013   Arthritis, degenerative 10/04/2013   HLD (hyperlipidemia) 10/04/2013   H/O disease 10/04/2013   Apnea, sleep  10/04/2013   Type 2 diabetes mellitus (HCC) 10/04/2013   Stroke (HCC) 09/08/2013   Obstructive sleep apnea 02/03/2013   Depression 07/05/2012   Major depressive disorder with single episode 07/05/2012   Hyperlipidemia LDL goal <100 12/23/2011   Atrial fibrillation (HCC)    Hypertension     1. Obstructive sleep apnea Continue excellent compliance  2. CPAP use counseling CPAP couseling-Discussed importance of adequate CPAP use as well as proper care and cleaning techniques of machine and all supplies.  3. Benign essential HTN Initially elevated but improved throughout exam, follow-up with PCP  4. Morbid obesity with BMI of 40.0-44.9, adult (HCC) Obesity Counseling: Had a lengthy discussion regarding patients BMI and weight issues. Patient was instructed on portion control as well as increased activity. Also discussed caloric restrictions with trying to maintain intake less than 2000 Kcal. Discussions were made in accordance with the 5As of weight management. Simple actions such as not eating late and if able to, taking a walk is suggested.    General Counseling: I have discussed the findings of the evaluation and examination with Mason Houston.  I have also discussed any further diagnostic evaluation thatmay be needed or ordered today. Mason Houston verbalizes understanding of the findings of todays visit. We also reviewed his medications today and discussed drug interactions and side effects including but not limited excessive drowsiness and altered mental states. We also discussed that there is always a risk not just to him but also people around him. he has been encouraged to call the office with any questions or concerns that should arise related to todays visit.  No orders of the defined types were placed in this encounter.    Time spent: 30  I have personally obtained a history, examined the patient, evaluated laboratory and imaging results, formulated the assessment and plan and placed  orders. This patient was seen by Lynn Ito, PA-C in collaboration with Dr. Freda Munro as a part of collaborative care agreement.     Yevonne Pax, MD [facility] Pulmonary and Critical Care Sleep medicine

## 2021-03-11 NOTE — Patient Instructions (Signed)

## 2021-03-21 ENCOUNTER — Ambulatory Visit: Payer: Medicare HMO | Admitting: Cardiovascular Disease

## 2021-03-21 ENCOUNTER — Other Ambulatory Visit: Payer: Self-pay | Admitting: Nurse Practitioner

## 2021-03-21 ENCOUNTER — Other Ambulatory Visit: Payer: Self-pay

## 2021-03-21 ENCOUNTER — Encounter: Payer: Self-pay | Admitting: Cardiovascular Disease

## 2021-03-21 VITALS — BP 156/78 | HR 95 | Ht 67.0 in | Wt 280.1 lb

## 2021-03-21 DIAGNOSIS — G4730 Sleep apnea, unspecified: Secondary | ICD-10-CM | POA: Diagnosis not present

## 2021-03-21 DIAGNOSIS — I10 Essential (primary) hypertension: Secondary | ICD-10-CM

## 2021-03-21 DIAGNOSIS — I482 Chronic atrial fibrillation, unspecified: Secondary | ICD-10-CM

## 2021-03-21 DIAGNOSIS — E785 Hyperlipidemia, unspecified: Secondary | ICD-10-CM

## 2021-03-21 NOTE — Patient Instructions (Signed)

## 2021-03-21 NOTE — Progress Notes (Signed)
?  ?Cardiology Office Note ? ? ?Date:  03/21/2021  ? ?ID:  Mason Houston, DOB [birth year], MRN [id number] ? ?PCP:  Baxter Hire, MD  ?Cardiologist:   Kathlyn Sacramento, MD  ? ?Chief Complaint  ?Patient presents with  ? Other  ?  6 Month f/u no complaints today. Meds reviewed verbally with pt.  ? ? ? ?  ?History of Present Illness: ?Mason Houston is a 71 y.o. male who presents for a followup visit regarding chronic atrial fibrillation. ?The patient has a long history of  hypertension and obesity. He was diagnosed with atrial fibrillation with rapid ventricular response during a routine physical  in December of 2013 in the setting of heavy caffeine intake. He also has sleep apnea currently effectively treated with CPAP.  ?He had a stroke in August 2015.   ?He was diagnosed with non-Hodgkin's lymphoma in 2017 and was treated successfully with chemotherapy. ?Echocardiogram In July 2017 showed normal LV systolic function.  ?  ?He is being treated with rate control for atrial fibrillation given lack of symptoms. ? ?Most recent echocardiogram in August 2022 showed normal LV systolic function and mild mitral regurgitation.  ? ?He has been doing reasonably well with no chest pain or worsening dyspnea.  He has minimal palpitations.  Blood pressure is usually elevated when he is stressed. ? ? ?Past Medical History:  ?Diagnosis Date  ? Atrial fibrillation ([place])   ? Cancer [facility])   ? Collagen vascular disease ([place])   ? Depression   ? Diabetes mellitus without complication ([place])   ? Dysrhythmia   ? Fibromyalgia   ? GERD (gastroesophageal reflux disease)   ? H/O Bell's palsy   ? History of [place] measles   ? Hypertension   ? Mini stroke   ? Non Hodgkin's lymphoma ([place]) 2017  ? Osteoarthrosis, unspecified whether generalized or localized, lower leg   ? Sixth cranial nerve palsy   ? Sleep apnea   ? Stroke [facility])   ? ? ?Past Surgical History:  ?Procedure Laterality Date  ? COLONOSCOPY  2012  ? Dr Candace Cruise  ? COLONOSCOPY WITH PROPOFOL N/A  09/09/2017  ? Procedure: COLONOSCOPY WITH PROPOFOL;  Surgeon: Toledo, Benay Pike, MD;  Location: [facility] ENDOSCOPY;  Service: Gastroenterology;  Laterality: N/A;  ? COLONOSCOPY WITH PROPOFOL N/A 09/21/2018  ? Procedure: COLONOSCOPY WITH PROPOFOL;  Surgeon: Toledo, Benay Pike, MD;  Location: [facility] ENDOSCOPY;  Service: Gastroenterology;  Laterality: N/A;  ? CYST EXCISION    ? ERCP N/A 12/29/2014  ? Procedure: ENDOSCOPIC RETROGRADE CHOLANGIOPANCREATOGRAPHY (ERCP);  Surgeon: Hulen Luster, MD;  Location: [facility] ENDOSCOPY;  Service: Endoscopy;  Laterality: N/A;  ? ERCP N/A 02/21/2015  ? Procedure: ENDOSCOPIC RETROGRADE CHOLANGIOPANCREATOGRAPHY (ERCP);  Surgeon: Hulen Luster, MD;  Location: [facility] ENDOSCOPY;  Service: Gastroenterology;  Laterality: N/A;  ? ERCP N/A 08/14/2015  ? Procedure: ENDOSCOPIC RETROGRADE CHOLANGIOPANCREATOGRAPHY (ERCP) Stent removal;  Surgeon: [name], MD;  Location: [facility] ENDOSCOPY;  Service: Endoscopy;  Laterality: N/A;  ? PERIPHERAL VASCULAR CATHETERIZATION N/A 01/31/2015  ? Procedure: Porta Cath Insertion;  Surgeon: Katha Cabal, MD;  Location: [place] CV LAB;  Service: Cardiovascular;  Laterality: N/A;  ? PERIPHERAL VASCULAR CATHETERIZATION N/A 12/18/2015  ? Procedure: Porta Cath Removal;  Surgeon: Katha Cabal, MD;  Location: [place] CV LAB;  Service: Cardiovascular;  Laterality: N/A;  ? TRIGGER FINGER RELEASE    ? ? ? ?Current Outpatient Medications  ?Medication Sig Dispense Refill  ? ACETAMINOPHEN ER PO Take 1,000 mg by mouth daily.    ?  Ascorbic Acid (VITAMIN C) 1000 MG tablet Take 1,000 mg by mouth daily.    ? b complex vitamins capsule Take 1 capsule by mouth daily.     ? cholecalciferol (VITAMIN D) 1000 units tablet Take 1,000 Units by mouth daily.    ? diltiazem (TIAZAC) 360 MG 24 hr capsule Take 1 capsule by mouth once daily 90 capsule 3  ? glipiZIDE (GLUCOTROL) 10 MG tablet Take 10 mg by mouth daily before breakfast.    ? loratadine (CLARITIN) 10 MG tablet Take 10 mg by mouth daily  as needed.     ? losartan (COZAAR) 50 MG tablet Take 2 tablets by mouth once daily 180 tablet 0  ? magnesium oxide (MAG-OX) 400 (241.3 Mg) MG tablet Take 250 mg daily by mouth.     ? metFORMIN (GLUCOPHAGE) 1000 MG tablet Take 1,000 mg by mouth 2 (two) times daily.    ? montelukast (SINGULAIR) 10 MG tablet Take 1 tablet by mouth at bedtime.    ? omeprazole (PRILOSEC) 20 MG capsule Take 1 capsule by mouth once daily 90 capsule 0  ? rivaroxaban (XARELTO) 20 MG TABS tablet TAKE 1 TABLET BY MOUTH ONCE DAILY WITH SUPPER 90 tablet 1  ? rosuvastatin (CRESTOR) 10 MG tablet TAKE 1 TABLET(10 MG) BY MOUTH DAILY 90 tablet 0  ? ?No current facility-administered medications for this visit.  ? ? ?Allergies:   Celebrex [celecoxib], Other, and Penicillins  ? ? ?Social History:  The patient  reports that he quit smoking about 40 years ago. His smoking use included cigarettes. He has a 67.50 pack-year smoking history. He has never used smokeless tobacco. He reports that he does not drink alcohol and does not use drugs.  ? ?Family History:  The patient's family history includes Diabetes in his paternal grandfather and paternal grandmother; Heart attack in his father; Hypertension in his mother; Stroke in his father.  ? ? ?ROS:  Please see the history of present illness.   Otherwise, review of systems are positive for none.   All other systems are reviewed and negative.  ? ? ?PHYSICAL EXAM: ?VS:  BP (!) 156/78 (BP Location: Left Arm, Patient Position: Sitting, Cuff Size: Large)   Pulse 95   Ht '5\' 7"'$  (1.702 m)   Wt 280 lb 2 oz (127.1 kg)   SpO2 98%   BMI 43.87 kg/m?  , BMI Body mass index is 43.87 kg/m?. ?GEN: Well nourished, well developed, in no acute distress  ?HEENT: normal  ?Neck: no JVD, carotid bruits, or masses ?Cardiac: Irregularly irregular; no murmurs, rubs, or gallops, trace leg edema  ?Respiratory:  clear to auscultation bilaterally, normal work of breathing ?GI: soft, nontender, nondistended, + BS ?MS: no deformity or  atrophy  ?Skin: warm and dry, no rash ?Neuro:  Strength and sensation are intact ?Psych: euthymic mood, full affect ? ? ?EKG:  EKG is  ordered today. ?EKG showed atrial fibrillation with ventricular rate of 95 bpm.  Right bundle branch block. ? ?Recent Labs: ?12/13/2020: ALT 25; BUN 18; Creatinine, Ser 1.06; Hemoglobin 13.7; Platelets 335; Potassium 4.9; Sodium 140  ? ? ?Lipid Panel ?   ?Component Value Date/Time  ? CHOL 139 05/08/2016 1152  ? CHOL 181 07/03/2015 1007  ? TRIG 69 05/08/2016 1152  ? HDL 42 05/08/2016 1152  ? HDL 44 07/03/2015 1007  ? CHOLHDL 3.3 05/08/2016 1152  ? VLDL 14 05/08/2016 1152  ? [place] 83 05/08/2016 1152  ? LDLCALC 122 (H) 07/03/2015 1007  ? ?  ? ?  Wt Readings from Last 3 Encounters:  ?03/21/21 280 lb 2 oz (127.1 kg)  ?03/11/21 278 lb 3.2 oz (126.2 kg)  ?12/13/20 276 lb (125.2 kg)  ?  ? ? ? ? ?ASSESSMENT AND PLAN: ? ? ?1.    Chronic atrial fibrillation:  Ventricular rate is controlled with diltiazem.  He is tolerating anticoagulation with Xarelto with no side effects.  I reviewed his most recent labs done in December which showed normal renal function and normal CBC. ?  ?2. Essential hypertension: His blood pressure is elevated today but has been more controlled.  He reports being under stress lately.  Continue diltiazem and losartan for now.  If blood pressure remains elevated, small dose thiazide diuretic can be considered. ?  ?3.  Hyperlipidemia: I reviewed most recent lipid profile done in December which showed an LDL of 51.  Continue rosuvastatin. ?  ?4. Sleep apnea: on CPAP ? ?5. Morbid obesity: I again discussed with him the importance of at least 30 minutes of exercise daily and healthy diet. ? ? ? ?Disposition:   FU with me in 6 months ? ?Signed, ? ?Kathlyn Sacramento, MD  ?03/21/2021 9:48 AM    ?[place] ?

## 2021-04-15 ENCOUNTER — Other Ambulatory Visit: Payer: Self-pay | Admitting: Cardiovascular Disease

## 2021-06-13 ENCOUNTER — Other Ambulatory Visit: Payer: Self-pay

## 2021-06-13 ENCOUNTER — Inpatient Hospital Stay: Payer: Medicare HMO | Attending: Internal Medicine

## 2021-06-13 ENCOUNTER — Encounter: Payer: Self-pay | Admitting: Internal Medicine

## 2021-06-13 ENCOUNTER — Inpatient Hospital Stay: Payer: Medicare HMO | Admitting: Internal Medicine

## 2021-06-13 DIAGNOSIS — C8333 Diffuse large B-cell lymphoma, intra-abdominal lymph nodes: Secondary | ICD-10-CM

## 2021-06-13 DIAGNOSIS — Z7901 Long term (current) use of anticoagulants: Secondary | ICD-10-CM | POA: Insufficient documentation

## 2021-06-13 DIAGNOSIS — E119 Type 2 diabetes mellitus without complications: Secondary | ICD-10-CM | POA: Diagnosis not present

## 2021-06-13 DIAGNOSIS — I480 Paroxysmal atrial fibrillation: Secondary | ICD-10-CM | POA: Insufficient documentation

## 2021-06-13 DIAGNOSIS — Z79899 Other long term (current) drug therapy: Secondary | ICD-10-CM | POA: Diagnosis not present

## 2021-06-13 DIAGNOSIS — C8338 Diffuse large B-cell lymphoma, lymph nodes of multiple sites: Secondary | ICD-10-CM | POA: Diagnosis present

## 2021-06-13 DIAGNOSIS — Z7984 Long term (current) use of oral hypoglycemic drugs: Secondary | ICD-10-CM | POA: Diagnosis not present

## 2021-06-13 LAB — CBC WITH DIFFERENTIAL/PLATELET
Abs Immature Granulocytes: 0.02 10*3/uL (ref 0.00–0.07)
Basophils Absolute: 0.1 10*3/uL (ref 0.0–0.1)
Basophils Relative: 1 %
Eosinophils Absolute: 0.3 10*3/uL (ref 0.0–0.5)
Eosinophils Relative: 3 %
HCT: 41.5 % (ref 39.0–52.0)
Hemoglobin: 13.5 g/dL (ref 13.0–17.0)
Immature Granulocytes: 0 %
Lymphocytes Relative: 18 %
Lymphs Abs: 1.3 10*3/uL (ref 0.7–4.0)
MCH: 28.2 pg (ref 26.0–34.0)
MCHC: 32.5 g/dL (ref 30.0–36.0)
MCV: 86.6 fL (ref 80.0–100.0)
Monocytes Absolute: 0.8 10*3/uL (ref 0.1–1.0)
Monocytes Relative: 10 %
Neutro Abs: 4.9 10*3/uL (ref 1.7–7.7)
Neutrophils Relative %: 68 %
Platelets: 329 10*3/uL (ref 150–400)
RBC: 4.79 MIL/uL (ref 4.22–5.81)
RDW: 13.2 % (ref 11.5–15.5)
WBC: 7.3 10*3/uL (ref 4.0–10.5)
nRBC: 0 % (ref 0.0–0.2)

## 2021-06-13 LAB — LACTATE DEHYDROGENASE: LDH: 127 U/L (ref 98–192)

## 2021-06-13 LAB — COMPREHENSIVE METABOLIC PANEL
ALT: 18 U/L (ref 0–44)
AST: 23 U/L (ref 15–41)
Albumin: 4 g/dL (ref 3.5–5.0)
Alkaline Phosphatase: 63 U/L (ref 38–126)
Anion gap: 9 (ref 5–15)
BUN: 18 mg/dL (ref 8–23)
CO2: 27 mmol/L (ref 22–32)
Calcium: 9.3 mg/dL (ref 8.9–10.3)
Chloride: 103 mmol/L (ref 98–111)
Creatinine, Ser: 1.13 mg/dL (ref 0.61–1.24)
GFR, Estimated: >60 mL/min (ref >60)
Glucose, Bld: 149 mg/dL — ABNORMAL HIGH (ref 70–99)
Potassium: 5.2 mmol/L — ABNORMAL HIGH (ref 3.5–5.1)
Sodium: 139 mmol/L (ref 135–145)
Total Bilirubin: 0.9 mg/dL (ref 0.3–1.2)
Total Protein: 7.6 g/dL (ref 6.5–8.1)

## 2021-06-13 NOTE — Progress Notes (Signed)
Ran into trailer hitch 2 weeks ago and has red knot on left shin. Can you look at this? He is on a blood thinner.  Can he go to once a year visits now that it's been 5 years?

## 2021-06-13 NOTE — Progress Notes (Signed)
[place] OFFICE PROGRESS NOTE  Patient Care Team: Baxter Hire, MD as PCP - General (Internal Medicine) Wellington Hampshire, MD as PCP - Cardiology (Cardiology) [name], MD (Infectious Diseases) Wellington Hampshire, MD as Consulting Physician (Cardiology) Bary Castilla Forest Gleason, MD (General Surgery) Roselee Nova, MD as Referring Physician (Family Medicine) Cammie Sickle, MD as Consulting Physician (Oncology)   Cancer Staging  No matching staging information was found for the patient.   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 Toledo]  --------------------------------------------------------   DIAGNOSIS: [Jan 2017 ]- DLBCL STAGE: IE  ;GOALS: CURATIVE CURRENT/MOST RECENT THERAPY [finished April 2017- R-CHOP X5]-surveillance    Diffuse large b-cell lymphoma, intra-abdominal lymph nodes ([facility]      INTERVAL HISTORY: Alone.  Ambulating independently.  Mason Crescenzo89y.o.  male pleasant patient above history of diffuse large B-cell lymphoma is here for follow-up.  Recently noted to have trauma to his left lower extremity while on xarelto. Mild swelling on left leg. No significant discomfort at this time.     he has been trying to be physically active.  However limited to arthritis. Denies any blood in stools or black-colored stools but denies any blood in urine.   Review of Systems   Constitutional:  Negative for chills, diaphoresis, fever, malaise/fatigue and weight loss.  HENT:  Negative for nosebleeds and sore throat.   Eyes:  Negative for double vision.  Respiratory:  Negative for cough, hemoptysis, sputum production, shortness of breath and wheezing.   Cardiovascular:  Negative for chest pain, palpitations, orthopnea and leg swelling.  Gastrointestinal:  Negative for abdominal pain, blood in stool, constipation, diarrhea, heartburn, melena, nausea and vomiting.  Genitourinary:  Negative for dysuria, frequency and urgency.  Musculoskeletal:  Negative for back pain and joint pain.  Skin: Negative.  Negative for itching and rash.  Neurological:  Negative for tingling, focal weakness, weakness and headaches.  Endo/Heme/Allergies:  Does not bruise/bleed easily.  Psychiatric/Behavioral:  Negative for depression. The patient is nervous/anxious. The patient does not have insomnia.       PAST MEDICAL HISTORY :  Past Medical History:  Diagnosis Date   Atrial fibrillation ([facility]    Cancer ([facility]    Collagen vascular disease ([facility]    Depression    Diabetes mellitus without complication ([place]    Dysrhythmia    Fibromyalgia    GERD (gastroesophageal reflux disease)    H/O Bell's palsy    History of German measles    Hypertension    Mini stroke    Non Hodgkin's lymphoma ([place] 2017   Osteoarthrosis, unspecified whether generalized or localized, lower leg    Sixth cranial nerve palsy    Sleep apnea    Stroke ([facility]     PAST SURGICAL HISTORY :   Past Surgical History:  Procedure Laterality Date   COLONOSCOPY  2012   Dr OCandace Cruise  COLONOSCOPY WITH PROPOFOL N/A 09/09/2017   Procedure: COLONOSCOPY  WITH PROPOFOL;  Surgeon: Toledo, Benay Pike, MD;  Location: [facility] ENDOSCOPY;  Service: Gastroenterology;  Laterality: N/A;   COLONOSCOPY WITH PROPOFOL N/A 09/21/2018   Procedure: COLONOSCOPY WITH PROPOFOL;  Surgeon: Toledo, Benay Pike, MD;  Location: [facility] ENDOSCOPY;  Service:  Gastroenterology;  Laterality: N/A;   CYST EXCISION     ERCP N/A 12/29/2014   Procedure: ENDOSCOPIC RETROGRADE CHOLANGIOPANCREATOGRAPHY (ERCP);  Surgeon: Hulen Luster, MD;  Location: [facility] ENDOSCOPY;  Service: Endoscopy;  Laterality: N/A;   ERCP N/A 02/21/2015   Procedure: ENDOSCOPIC RETROGRADE CHOLANGIOPANCREATOGRAPHY (ERCP);  Surgeon: Hulen Luster, MD;  Location: [facility] ENDOSCOPY;  Service: Gastroenterology;  Laterality: N/A;   ERCP N/A 08/14/2015   Procedure: ENDOSCOPIC RETROGRADE CHOLANGIOPANCREATOGRAPHY (ERCP) Stent removal;  Surgeon: [name], MD;  Location: [facility] ENDOSCOPY;  Service: Endoscopy;  Laterality: N/A;   PERIPHERAL VASCULAR CATHETERIZATION N/A 01/31/2015   Procedure: Glori Luis Cath Insertion;  Surgeon: Katha Cabal, MD;  Location: [place] CV LAB;  Service: Cardiovascular;  Laterality: N/A;   PERIPHERAL VASCULAR CATHETERIZATION N/A 12/18/2015   Procedure: Glori Luis Cath Removal;  Surgeon: Katha Cabal, MD;  Location: [place] CV LAB;  Service: Cardiovascular;  Laterality: N/A;   TRIGGER FINGER RELEASE      FAMILY HISTORY :   Family History  Problem Relation Age of Onset   Hypertension Mother    Heart attack Father    Stroke Father    Diabetes Paternal Grandmother    Diabetes Paternal Grandfather     SOCIAL HISTORY:   Social History   Tobacco Use   Smoking status: Former    Packs/day: 4.50    Years: 15.00    Total pack years: 67.50    Types: Cigarettes    Quit date: 01/06/1981    Years since quitting: 40.4   Smokeless tobacco: Never  Vaping Use   Vaping Use: Never used  Substance Use Topics   Alcohol use: No   Drug use: No    Types: Marijuana    Comment: PAST    ALLERGIES:  is allergic to celebrex [celecoxib], other, and penicillins.  MEDICATIONS:  Current Outpatient Medications  Medication Sig Dispense Refill   ACETAMINOPHEN ER PO Take 1,000 mg by mouth daily.     Ascorbic Acid (VITAMIN C) 1000 MG tablet Take 1,000 mg by mouth daily.     b complex  vitamins capsule Take 1 capsule by mouth daily.      cholecalciferol (VITAMIN D) 1000 units tablet Take 1,000 Units by mouth daily.     diltiazem (TIAZAC) 360 MG 24 hr capsule Take 1 capsule by mouth once daily 90 capsule 3   glipiZIDE (GLUCOTROL) 10 MG tablet Take 10 mg by mouth daily before breakfast.     loratadine (CLARITIN) 10 MG tablet Take 10 mg by mouth daily as needed.      losartan (COZAAR) 50 MG tablet Take 2 tablets by mouth once daily 180 tablet 2   magnesium oxide (MAG-OX) 400 (241.3 Mg) MG tablet Take 250 mg daily by mouth.      metFORMIN (GLUCOPHAGE) 1000 MG tablet Take 1,000 mg by mouth 2 (two) times daily.     montelukast (SINGULAIR) 10 MG tablet Take 1 tablet by mouth at bedtime.     omeprazole (PRILOSEC) 20 MG capsule Take 1 capsule by mouth once daily 90 capsule 0   rivaroxaban (XARELTO) 20 MG TABS tablet TAKE 1 TABLET BY MOUTH ONCE DAILY WITH SUPPER 90 tablet 1   rosuvastatin (CRESTOR) 10 MG tablet TAKE 1 TABLET(10  MG) BY MOUTH DAILY 90 tablet 0   No current facility-administered medications for this visit.    PHYSICAL EXAMINATION: ECOG PERFORMANCE STATUS: 0 - Asymptomatic  BP (!) 153/86 (BP Location: Right Arm, Patient Position: Sitting, Cuff Size: Normal)   Pulse (!) 52   Temp 98.3 F (36.8 C) (Tympanic)   Ht '5\' 7"'$  (1.702 m)   Wt 273 lb 3.2 oz (123.9 kg)   SpO2 96%   BMI 42.79 kg/m   Filed Weights   06/13/21 1042  Weight: 273 lb 3.2 oz (123.9 kg)    Physical Exam HENT:     Head: Normocephalic and atraumatic.     Mouth/Throat:     Pharynx: No oropharyngeal exudate.  Eyes:     Pupils: Pupils are equal, round, and reactive to light.  Cardiovascular:     Rate and Rhythm: Normal rate and regular rhythm.  Pulmonary:     Effort: No respiratory distress.     Breath sounds: No wheezing.  Abdominal:     General: Bowel sounds are normal. There is no distension.     Palpations: Abdomen is soft. There is no mass.     Tenderness: There is no abdominal  tenderness. There is no guarding or rebound.  Musculoskeletal:        General: No tenderness. Normal range of motion.     Cervical back: Normal range of motion and neck supple.  Skin:    General: Skin is warm.  Neurological:     Mental Status: He is alert and oriented to person, place, and time.  Psychiatric:        Mood and Affect: Affect normal.        LABORATORY DATA:  I have reviewed the data as listed    Component Value Date/Time   NA 139 06/13/2021 0958   NA 142 05/15/2015 1156   NA 139 12/19/2011 1044   K 5.2 (H) 06/13/2021 0958   K 4.1 12/19/2011 1044   CL 103 06/13/2021 0958   CL 106 12/19/2011 1044   CO2 27 06/13/2021 0958   CO2 26 12/19/2011 1044   GLUCOSE 149 (H) 06/13/2021 0958   GLUCOSE 95 12/19/2011 1044   BUN 18 06/13/2021 0958   BUN 16 05/15/2015 1156   BUN 18 12/19/2011 1044   CREATININE 1.13 06/13/2021 0958   CREATININE 0.88 05/08/2016 1152   CALCIUM 9.3 06/13/2021 0958   CALCIUM 9.0 12/19/2011 1044   PROT 7.6 06/13/2021 0958   PROT 6.1 05/15/2015 1156   PROT 9.0 (H) 12/19/2011 1044   ALBUMIN 4.0 06/13/2021 0958   ALBUMIN 3.7 05/15/2015 1156   ALBUMIN 4.4 12/19/2011 1044   AST 23 06/13/2021 0958   AST 44 (H) 12/19/2011 1044   ALT 18 06/13/2021 0958   ALT 46 12/19/2011 1044   ALKPHOS 63 06/13/2021 0958   ALKPHOS 110 12/19/2011 1044   BILITOT 0.9 06/13/2021 0958   BILITOT 0.2 05/15/2015 1156   BILITOT 0.7 12/19/2011 1044   GFRNONAA >60 06/13/2021 0958   GFRNONAA >89 05/08/2016 1152   GFRAA >60 06/15/2019 0948   GFRAA >89 05/08/2016 1152    No results found for: "SPEP", "UPEP"  Lab Results  Component Value Date   WBC 7.3 06/13/2021   NEUTROABS 4.9 06/13/2021   HGB 13.5 06/13/2021   HCT 41.5 06/13/2021   MCV 86.6 06/13/2021   PLT 329 06/13/2021      Chemistry      Component Value Date/Time   NA 139 06/13/2021 0958  NA 142 05/15/2015 1156   NA 139 12/19/2011 1044   K 5.2 (H) 06/13/2021 0958   K 4.1 12/19/2011 1044   CL 103  06/13/2021 0958   CL 106 12/19/2011 1044   CO2 27 06/13/2021 0958   CO2 26 12/19/2011 1044   BUN 18 06/13/2021 0958   BUN 16 05/15/2015 1156   BUN 18 12/19/2011 1044   CREATININE 1.13 06/13/2021 0958   CREATININE 0.88 05/08/2016 1152      Component Value Date/Time   CALCIUM 9.3 06/13/2021 0958   CALCIUM 9.0 12/19/2011 1044   ALKPHOS 63 06/13/2021 0958   ALKPHOS 110 12/19/2011 1044   AST 23 06/13/2021 0958   AST 44 (H) 12/19/2011 1044   ALT 18 06/13/2021 0958   ALT 46 12/19/2011 1044   BILITOT 0.9 06/13/2021 0958   BILITOT 0.2 05/15/2015 1156   BILITOT 0.7 12/19/2011 1044       RADIOGRAPHIC STUDIES: I have personally reviewed the radiological images as listed and agreed with the findings in the report. No results found.   ASSESSMENT & PLAN:  Diffuse large b-cell lymphoma, intra-abdominal lymph nodes (HCC) # DLBCL of pancreatic head-  Stage IE. S/p R-CHOP [finished 2017 April].  STABLE  Currently no evidence of disease; monitor clinically.  Labs within normal limits.  Discussed with patient that is likely cured.  We will continue follow-up with out imaging/on a clinical basis.   # DM- FBS-146-  continue metformin/Glucotrol; discussed regarding dietary interventions /reducing carbohydrates.  Continue close follow-up with PCP.  # Paroxysmal A.fib- on xarelto-[Dr.Arida] stable no bleeding.STABLE   # DISPOSITION:  # Follow up in 12  months -MD-labs-cbc/cmp/dh-Dr.B.      Orders Placed This Encounter  Procedures   CBC with Differential/Platelet    Standing Status:   Future    Standing Expiration Date:   06/14/2022   Comprehensive metabolic panel    Standing Status:   Future    Standing Expiration Date:   06/14/2022   Lactate dehydrogenase    Standing Status:   Future    Standing Expiration Date:   06/14/2022   All questions were answered. The patient knows to call the clinic with any problems, questions or concerns.      Cammie Sickle, MD 06/13/2021 3:49 PM

## 2021-06-13 NOTE — Assessment & Plan Note (Addendum)
#   DLBCL of pancreatic head-  Stage IE. S/p R-CHOP [finished 2017 April].  STABLE  Currently no evidence of disease; monitor clinically.  Labs within normal limits.  Discussed with patient that is likely cured.  We will continue follow-up with out imaging/on a clinical basis.   # DM- FBS-146-  continue metformin/Glucotrol; discussed regarding dietary interventions /reducing carbohydrates.  Continue close follow-up with PCP.  # Paroxysmal A.fib- on xarelto-[Dr.Arida] stable no bleeding.STABLE   #Ecchymosis left shin-mild trauma no significant bleeding noted.  Okay to continue Xarelto.  # DISPOSITION:  # Follow up in 12  months -MD-labs-cbc/cmp/dh-Dr.B.

## 2021-08-12 ENCOUNTER — Other Ambulatory Visit: Payer: Self-pay | Admitting: Cardiovascular Disease

## 2021-08-12 NOTE — Telephone Encounter (Signed)
Prescription refill request for Xarelto received.  Indication: Atrial Fib Last office visit: 03/21/21  Rod Can MD Weight: 127.1kg Age: 71 Scr: 1.1 on 07/19/21 CrCl: 113.94  Based on above findings Xarelto '20mg'$  daily is the appropriate dose.  Refill approved.

## 2021-09-26 ENCOUNTER — Encounter: Payer: Self-pay | Admitting: Cardiovascular Disease

## 2021-09-26 ENCOUNTER — Ambulatory Visit: Payer: Medicare HMO | Attending: Cardiovascular Disease | Admitting: Cardiovascular Disease

## 2021-09-26 VITALS — BP 118/74 | HR 78 | Ht 67.0 in | Wt 275.1 lb

## 2021-09-26 DIAGNOSIS — E785 Hyperlipidemia, unspecified: Secondary | ICD-10-CM | POA: Diagnosis not present

## 2021-09-26 DIAGNOSIS — G4730 Sleep apnea, unspecified: Secondary | ICD-10-CM | POA: Diagnosis not present

## 2021-09-26 DIAGNOSIS — I10 Essential (primary) hypertension: Secondary | ICD-10-CM | POA: Diagnosis not present

## 2021-09-26 DIAGNOSIS — I482 Chronic atrial fibrillation, unspecified: Secondary | ICD-10-CM

## 2021-09-26 NOTE — Progress Notes (Signed)
Cardiology Office Note   Date:  09/26/2021   ID:  Mason Houston, DOB November 06, 1950, MRN [id number]  PCP:  Baxter Hire, MD  Cardiologist:   Kathlyn Sacramento, MD   Chief Complaint  Patient presents with   OTHER    6 Month fu no complaints today. Meds reviewed verbally with pt.       History of Present Illness: Mason Houston is a 71 y.o. male who presents for a followup visit regarding chronic atrial fibrillation. The patient has a long history of  hypertension and obesity. He was diagnosed with atrial fibrillation with rapid ventricular response during a routine physical  in December of 2013 in the setting of heavy caffeine intake. He also has sleep apnea currently effectively treated with CPAP.  He had a stroke in August 2015.   He was diagnosed with non-Hodgkin's lymphoma in 2017 and was treated successfully with chemotherapy.   He is being treated with rate control for atrial fibrillation given lack of symptoms.  Most recent echocardiogram in August 2022 showed normal LV systolic function and mild mitral regurgitation.   He has been doing reasonably well with no chest pain or worsening dyspnea.  He has minimal palpitations.  He has labile blood pressure but overall his blood pressure has been well controlled especially at home.   Past Medical History:  Diagnosis Date   Atrial fibrillation ([place])    Cancer ([place])    Collagen vascular disease ([place])    Depression    Diabetes mellitus without complication ([place])    Dysrhythmia    Fibromyalgia    GERD (gastroesophageal reflux disease)    H/O Bell's palsy    History of German measles    Hypertension    Mini stroke    Non Hodgkin's lymphoma ([place]) 2017   Osteoarthrosis, unspecified whether generalized or localized, lower leg    Sixth cranial nerve palsy    Sleep apnea    Stroke [facility])     Past Surgical History:  Procedure Laterality Date   COLONOSCOPY  2012   Dr Candace Cruise   COLONOSCOPY WITH PROPOFOL N/A 09/09/2017    Procedure: COLONOSCOPY WITH PROPOFOL;  Surgeon: Toledo, Benay Pike, MD;  Location: [facility] ENDOSCOPY;  Service: Gastroenterology;  Laterality: N/A;   COLONOSCOPY WITH PROPOFOL N/A 09/21/2018   Procedure: COLONOSCOPY WITH PROPOFOL;  Surgeon: Toledo, Benay Pike, MD;  Location: [facility] ENDOSCOPY;  Service: Gastroenterology;  Laterality: N/A;   CYST EXCISION     ERCP N/A 12/29/2014   Procedure: ENDOSCOPIC RETROGRADE CHOLANGIOPANCREATOGRAPHY (ERCP);  Surgeon: Hulen Luster, MD;  Location: [facility] ENDOSCOPY;  Service: Endoscopy;  Laterality: N/A;   ERCP N/A 02/21/2015   Procedure: ENDOSCOPIC RETROGRADE CHOLANGIOPANCREATOGRAPHY (ERCP);  Surgeon: Hulen Luster, MD;  Location: [facility] ENDOSCOPY;  Service: Gastroenterology;  Laterality: N/A;   ERCP N/A 08/14/2015   Procedure: ENDOSCOPIC RETROGRADE CHOLANGIOPANCREATOGRAPHY (ERCP) Stent removal;  Surgeon: [name], MD;  Location: [facility] ENDOSCOPY;  Service: Endoscopy;  Laterality: N/A;   PERIPHERAL VASCULAR CATHETERIZATION N/A 01/31/2015   Procedure: Glori Luis Cath Insertion;  Surgeon: Katha Cabal, MD;  Location: [place] CV LAB;  Service: Cardiovascular;  Laterality: N/A;   PERIPHERAL VASCULAR CATHETERIZATION N/A 12/18/2015   Procedure: Glori Luis Cath Removal;  Surgeon: Katha Cabal, MD;  Location: [place] CV LAB;  Service: Cardiovascular;  Laterality: N/A;   TRIGGER FINGER RELEASE       Current Outpatient Medications  Medication Sig Dispense Refill   ACETAMINOPHEN ER PO Take 1,000 mg by mouth daily.  Ascorbic Acid (VITAMIN C) 1000 MG tablet Take 1,000 mg by mouth daily.     b complex vitamins capsule Take 1 capsule by mouth daily.      cholecalciferol (VITAMIN D) 1000 units tablet Take 1,000 Units by mouth daily.     diltiazem (TIAZAC) 360 MG 24 hr capsule Take 1 capsule by mouth once daily 90 capsule 3   glipiZIDE (GLUCOTROL) 10 MG tablet Take 10 mg by mouth daily before breakfast.     loratadine (CLARITIN) 10 MG tablet Take 10 mg by mouth daily as needed.       losartan (COZAAR) 50 MG tablet Take 2 tablets by mouth once daily 180 tablet 2   magnesium oxide (MAG-OX) 400 (241.3 Mg) MG tablet Take 250 mg daily by mouth.      metFORMIN (GLUCOPHAGE) 1000 MG tablet Take 1,000 mg by mouth 2 (two) times daily.     montelukast (SINGULAIR) 10 MG tablet Take 1 tablet by mouth at bedtime.     omeprazole (PRILOSEC) 20 MG capsule Take 1 capsule by mouth once daily 90 capsule 0   rivaroxaban (XARELTO) 20 MG TABS tablet TAKE 1 TABLET BY MOUTH ONCE DAILY WITH SUPPER 90 tablet 5   rosuvastatin (CRESTOR) 10 MG tablet TAKE 1 TABLET(10 MG) BY MOUTH DAILY 90 tablet 0   No current facility-administered medications for this visit.    Allergies:   Celebrex [celecoxib], Other, and Penicillins    Social History:  The patient  reports that he quit smoking about 40 years ago. His smoking use included cigarettes. He has a 67.50 pack-year smoking history. He has never used smokeless tobacco. He reports that he does not drink alcohol and does not use drugs.   Family History:  The patient's family history includes Diabetes in his paternal grandfather and paternal grandmother; Heart attack in his father; Hypertension in his mother; Stroke in his father.    ROS:  Please see the history of present illness.   Otherwise, review of systems are positive for none.   All other systems are reviewed and negative.    PHYSICAL EXAM: VS:  BP 118/74 (BP Location: Left Arm, Patient Position: Sitting, Cuff Size: Large)   Pulse 78   Ht '5\' 7"'$  (1.702 m)   Wt 275 lb 2 oz (124.8 kg)   SpO2 98%   BMI 43.09 kg/m  , BMI Body mass index is 43.09 kg/m. GEN: Well nourished, well developed, in no acute distress  HEENT: normal  Neck: no JVD, carotid bruits, or masses Cardiac: Irregularly irregular; no murmurs, rubs, or gallops, trace leg edema  Respiratory:  clear to auscultation bilaterally, normal work of breathing GI: soft, nontender, nondistended, + BS MS: no deformity or atrophy  Skin:  warm and dry, no rash Neuro:  Strength and sensation are intact Psych: euthymic mood, full affect   EKG:  EKG is  ordered today. EKG showed atrial fibrillation with ventricular rate of 78 bpm.  Right bundle branch block.  Recent Labs: 06/13/2021: ALT 18; BUN 18; Creatinine, Ser 1.13; Hemoglobin 13.5; Platelets 329; Potassium 5.2; Sodium 139    Lipid Panel    Component Value Date/Time   CHOL 139 05/08/2016 1152   CHOL 181 07/03/2015 1007   TRIG 69 05/08/2016 1152   HDL 42 05/08/2016 1152   HDL 44 07/03/2015 1007   CHOLHDL 3.3 05/08/2016 1152   VLDL 14 05/08/2016 1152   LDLCALC 83 05/08/2016 1152   LDLCALC 122 (H) 07/03/2015 1007  Wt Readings from Last 3 Encounters:  09/26/21 275 lb 2 oz (124.8 kg)  06/13/21 273 lb 3.2 oz (123.9 kg)  03/21/21 280 lb 2 oz (127.1 kg)        ASSESSMENT AND PLAN:   1.    Chronic atrial fibrillation:  Ventricular rate is controlled with diltiazem.  He is tolerating anticoagulation with Xarelto with no side effects.  I reviewed his most recent labs done in December which showed normal renal function and normal CBC.   2. Essential hypertension: His blood pressure is well controlled on diltiazem and losartan.   3.  Hyperlipidemia: I reviewed most recent lipid profile done in December which showed an LDL of 51.  Continue rosuvastatin.   4. Sleep apnea: on CPAP  5. Morbid obesity: I again discussed with him the importance of at least 30 minutes of exercise daily and healthy diet.    Disposition:   FU with me in 6 months  Signed,  Kathlyn Sacramento, MD  09/26/2021 9:47 AM    [place]

## 2021-09-26 NOTE — Patient Instructions (Signed)
Medication Instructions:  Your physician recommends that you continue on your current medications as directed. Please refer to the Current Medication list given to you today.  *If you need a refill on your cardiac medications before your next appointment, please call your pharmacy*   Lab Work: None ordered  If you have labs (blood work) drawn today and your tests are completely normal, you will receive your results only by: MyChart Message (if you have MyChart) OR A paper copy in the mail If you have any lab test that is abnormal or we need to change your treatment, we will call you to review the results.   Testing/Procedures: None ordered   Follow-Up: At [facility], you and your health needs are our priority.  As part of our continuing mission to provide you with exceptional heart care, we have created designated Provider Care Teams.  These Care Teams include your primary Cardiologist (physician) and Advanced Practice Providers (APPs -  Physician Assistants and Nurse Practitioners) who all work together to provide you with the care you need, when you need it.  We recommend signing up for the patient portal called "MyChart".  Sign up information is provided on this After Visit Summary.  MyChart is used to connect with patients for Virtual Visits (Telemedicine).  Patients are able to view lab/test results, encounter notes, upcoming appointments, etc.  Non-urgent messages can be sent to your provider as well.   To learn more about what you can do with MyChart, go to [email or web address].com.    Your next appointment:   6 month(s)  The format for your next appointment:   In Person  Provider:   You may see Muhammad Arida, MD or one of the following Advanced Practice Providers on your designated Care Team:   Christopher Berge, NP Ryan Dunn, PA-C Cadence Furth, PA-C Sheri Hammock, NP   Other Instructions N/A  Important Information About Sugar       

## 2021-10-05 ENCOUNTER — Other Ambulatory Visit: Payer: Self-pay | Admitting: Cardiovascular Disease

## 2021-12-13 ENCOUNTER — Other Ambulatory Visit: Payer: Medicare HMO

## 2021-12-13 ENCOUNTER — Ambulatory Visit: Payer: Medicare HMO | Admitting: Internal Medicine

## 2022-01-01 ENCOUNTER — Other Ambulatory Visit: Payer: Self-pay | Admitting: Cardiovascular Disease

## 2022-02-19 ENCOUNTER — Telehealth: Payer: Self-pay | Admitting: Physician Assistant

## 2022-02-19 ENCOUNTER — Ambulatory Visit: Payer: Medicare HMO

## 2022-02-19 NOTE — Telephone Encounter (Signed)
Left vm and sent mychart message to confirm 02/26/22 appointment-Toni

## 2022-02-26 ENCOUNTER — Ambulatory Visit (INDEPENDENT_AMBULATORY_CARE_PROVIDER_SITE_OTHER): Payer: Medicare HMO

## 2022-02-26 DIAGNOSIS — G4733 Obstructive sleep apnea (adult) (pediatric): Secondary | ICD-10-CM | POA: Diagnosis not present

## 2022-02-26 NOTE — Progress Notes (Signed)
95 percentile pressure 8   95th percentile leak 39.2   apnea index 4.0 /hr  apnea-hypopnea index  5.0 /hr   total days used  >4 hr 88 days  total days used <4 hr 2 days  Total compliance 98 percent   Pt was seen by Claiborne Billings  RRT/RCP  from [facility]

## 2022-03-24 ENCOUNTER — Ambulatory Visit: Payer: Medicare HMO | Admitting: Physician Assistant

## 2022-03-24 ENCOUNTER — Telehealth: Payer: Self-pay | Admitting: Physician Assistant

## 2022-03-24 ENCOUNTER — Encounter: Payer: Self-pay | Admitting: Physician Assistant

## 2022-03-24 VITALS — BP 150/82 | HR 75 | Temp 97.8°F | Resp 16 | Ht 67.0 in | Wt 283.0 lb

## 2022-03-24 DIAGNOSIS — G4733 Obstructive sleep apnea (adult) (pediatric): Secondary | ICD-10-CM | POA: Diagnosis not present

## 2022-03-24 DIAGNOSIS — Z6841 Body Mass Index (BMI) 40.0 and over, adult: Secondary | ICD-10-CM

## 2022-03-24 DIAGNOSIS — R0602 Shortness of breath: Secondary | ICD-10-CM

## 2022-03-24 DIAGNOSIS — Z7189 Other specified counseling: Secondary | ICD-10-CM | POA: Diagnosis not present

## 2022-03-24 DIAGNOSIS — I10 Essential (primary) hypertension: Secondary | ICD-10-CM

## 2022-03-24 NOTE — Progress Notes (Signed)
[facility] [place], [place] [address]  Pulmonary Sleep Medicine   Office Visit Note  Patient Name: Mason Houston DOB: 16-Oct-1950 MRN [id number]  Date of Service: 03/24/2022  Complaints/HPI: Pt is here for routine pulmonary follow up for OSA on CPAP. He reports he had covid in 2020, ever since then he has had a breathing issue. He reports he was seeing Dr. Vella Kohler with pulmonology a few years ago about this, but has not seen him in over a year. He would like to have all his pulmonary care here. He states he can take a full breath, but finds difficulty expelling air completely. Oxygen levels have been good. Feels there is some phlegm when breathing out as well. Worse with moving, showering, or when he gets more anxious. He was given albuterol inhaler, but only helps briefly. Tried mucinex for awhile to help, but didn't help much. Used to take montelukast, but stopped this as well. He quit smoking in 1983. He states he did have some lung testing a few years ago that looked ok. Discussed we will repeat this and order CT chest given symptoms. 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 He does see cardiology on Thursday and does also have some swelling in ankles, advised to also discuss symptoms with cardiology as SOB on exertion and leg swelling may be due to heart. He is doing well with cpap. He is wearing nightly and his mask is fitting well. His AHI is up a little and now borderline, but doing ok. Doing well with current settings. He has gained some weight which is contributing and will work on this.  CPAP download: 95 percentile pressure 8 95th percentile leak 39.2 apnea index 4.0 /hr apnea-hypopnea index  5.0 /hr total days used  >4 hr 88 days total days used <4 hr 2 days Total compliance 98 percent Pt was seen by Claiborne Billings  RRT/RCP  from [place]: (-) fever, (-) chills, (-) night sweats, (-)  weakness Skin: (-) rashes, (-) itching,. Eyes: (-) visual changes, (-) redness, (-) itching. Nose and Sinuses: (-) nasal stuffiness or itchiness, (-) postnasal drip, (-) nosebleeds, (-) sinus trouble. Mouth and Throat: (-) sore throat, (-) hoarseness. Neck: (-) swollen glands, (-) enlarged thyroid, (-) neck pain. Respiratory: + cough, (-) bloody sputum, + shortness of breath, - wheezing. Cardiovascular: + ankle swelling, (-) chest pain. Lymphatic: (-) lymph node enlargement. Neurologic: (-) numbness, (-) tingling. Psychiatric: (-) anxiety, (-) depression   Current Medication: Outpatient Encounter Medications as of 03/24/2022  Medication Sig   ACETAMINOPHEN ER PO Take 1,000 mg by mouth daily.   Ascorbic Acid (VITAMIN C) 1000 MG tablet Take 1,000 mg by mouth daily.   b complex vitamins capsule Take 1 capsule by mouth daily.    cholecalciferol (VITAMIN D) 1000 units tablet Take 1,000 Units by mouth daily.   diltiazem (TIAZAC) 360 MG 24 hr capsule Take 1 capsule by mouth once daily   glipiZIDE (GLUCOTROL) 10 MG tablet Take 10 mg by mouth daily before breakfast.   loratadine (CLARITIN) 10 MG tablet Take 10 mg by mouth daily as needed.    losartan (COZAAR) 50 MG tablet Take 2 tablets by mouth once daily   magnesium oxide (MAG-OX) 400 (241.3 Mg) MG tablet Take 250 mg daily by mouth.    metFORMIN (GLUCOPHAGE) 1000 MG tablet Take 1,000 mg by mouth 2 (two) times daily.   omeprazole (  PRILOSEC) 20 MG capsule Take 1 capsule by mouth once daily   rivaroxaban (XARELTO) 20 MG TABS tablet TAKE 1 TABLET BY MOUTH ONCE DAILY WITH SUPPER   rosuvastatin (CRESTOR) 10 MG tablet TAKE 1 TABLET(10 MG) BY MOUTH DAILY   [DISCONTINUED] montelukast (SINGULAIR) 10 MG tablet Take 1 tablet by mouth at bedtime.   No facility-administered encounter medications on file as of 03/24/2022.    Surgical History: Past Surgical History:  Procedure Laterality Date   COLONOSCOPY  2012   Dr Candace Cruise   COLONOSCOPY WITH PROPOFOL N/A  09/09/2017   Procedure: COLONOSCOPY WITH PROPOFOL;  Surgeon: Toledo, Benay Pike, MD;  Location: [facility] ENDOSCOPY;  Service: Gastroenterology;  Laterality: N/A;   COLONOSCOPY WITH PROPOFOL N/A 09/21/2018   Procedure: COLONOSCOPY WITH PROPOFOL;  Surgeon: Toledo, Benay Pike, MD;  Location: [facility] ENDOSCOPY;  Service: Gastroenterology;  Laterality: N/A;   CYST EXCISION     ERCP N/A 12/29/2014   Procedure: ENDOSCOPIC RETROGRADE CHOLANGIOPANCREATOGRAPHY (ERCP);  Surgeon: Hulen Luster, MD;  Location: [facility] ENDOSCOPY;  Service: Endoscopy;  Laterality: N/A;   ERCP N/A 02/21/2015   Procedure: ENDOSCOPIC RETROGRADE CHOLANGIOPANCREATOGRAPHY (ERCP);  Surgeon: Hulen Luster, MD;  Location: [facility] ENDOSCOPY;  Service: Gastroenterology;  Laterality: N/A;   ERCP N/A 08/14/2015   Procedure: ENDOSCOPIC RETROGRADE CHOLANGIOPANCREATOGRAPHY (ERCP) Stent removal;  Surgeon: [name], MD;  Location: [facility] ENDOSCOPY;  Service: Endoscopy;  Laterality: N/A;   PERIPHERAL VASCULAR CATHETERIZATION N/A 01/31/2015   Procedure: Glori Luis Cath Insertion;  Surgeon: Katha Cabal, MD;  Location: [place] CV LAB;  Service: Cardiovascular;  Laterality: N/A;   PERIPHERAL VASCULAR CATHETERIZATION N/A 12/18/2015   Procedure: Glori Luis Cath Removal;  Surgeon: Katha Cabal, MD;  Location: [place] [facility];  Service: Cardiovascular;  Laterality: N/A;   TRIGGER FINGER RELEASE      Medical History: Past Medical History:  Diagnosis Date   Atrial fibrillation ([place])    Cancer ([place])    Collagen vascular disease ([place])    Depression    Diabetes mellitus without complication ([place])    Dysrhythmia    Fibromyalgia    GERD (gastroesophageal reflux disease)    H/O Bell's palsy    History of German measles    Hypertension    Mini stroke    Non Hodgkin's lymphoma ([place]) 2017   Osteoarthrosis, unspecified whether generalized or localized, lower leg    Sixth cranial nerve palsy    Sleep apnea    Stroke ([place])     Family History: Family History   Problem Relation Age of Onset   Hypertension Mother    Heart attack Father    Stroke Father    Diabetes Paternal Grandmother    Diabetes Paternal Grandfather     Social History: Social History   Socioeconomic History   Marital status: Married    Spouse name: Not on file   Number of children: Not on file   Years of education: Not on file   Highest education level: Not on file  Occupational History   Not on file  Tobacco Use   Smoking status: Former    Packs/day: 4.50    Years: 15.00    Additional pack years: 0.00    Total pack years: 67.50    Types: Cigarettes    Quit date: 01/06/1981    Years since quitting: 41.2   Smokeless tobacco: Never  Vaping Use   Vaping Use: Never used  Substance and Sexual Activity   Alcohol use: No   Drug use: No  Types: Marijuana    Comment: PAST   Sexual activity: Not on file  Other Topics Concern   Not on file  Social History Narrative   Not on file   Social Determinants of Health   Financial Resource Strain: Not on file  Food Insecurity: Not on file  Transportation Needs: Not on file  Physical Activity: Not on file  Stress: Not on file  Social Connections: Not on file  Intimate Partner Violence: Not on file    Vital Signs: Blood pressure (!) 150/82, pulse 75, temperature 97.8 F (36.6 C), resp. rate 16, height 5\' 7"  (1.702 m), weight 283 lb (128.4 kg), SpO2 98 %.  Examination: General Appearance: The patient is well-developed, well-nourished, and in no distress. Skin: Gross inspection of skin unremarkable. Head: normocephalic, no gross deformities. Eyes: no gross deformities noted. ENT: ears appear grossly normal no exudates. Neck: Supple. No thyromegaly. No LAD. Respiratory: Wheezing bilaterally. Cardiovascular: Normal S1 and S2 without murmur or rub. Extremities: No cyanosis. pulses are equal. Neurologic: Alert and oriented. No involuntary movements.  LABS: No results found for this or any previous visit (from the  past 2160 hour(s)).  Radiology: No results found.  No results found.  No results found.    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[place]) 12/06/2015   Localized skin mass, lump, or swelling 11/09/2015   Internal nasal lesion 10/30/2015   Fitting and adjustment of gastrointestinal appliance and device    Disease of biliary tract    History of stroke 07/25/2015   Annual physical exam 07/03/2015   Pneumonia 05/15/2015   Fever 05/15/2015   Hypoxia 05/07/2015   Pancreatic mass 01/12/2015   Obstructive jaundice 12/28/2014   Arthritis 06/09/2014   Benign essential HTN 10/04/2013   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 SOB (shortness of breath) Will order further testing for evaluation, may need maintenance inhaler - Pulmonary Function Test; Future - CT Chest Wo Contrast; Future  2. Obstructive sleep apnea [G47.33] Continue cpap nightly   3. CPAP use counseling CPAP couseling-Discussed importance of adequate CPAP use as well as proper care and cleaning techniques of machine and all supplies.  4. Benign essential HTN Continue current medication and f/u with PCP/cardiology  5. Morbid obesity with BMI of 40.0-44.9, adult ([place]) Obesity Counseling: Had a lengthy discussion regarding patients BMI and weight issues. Patient was instructed on portion control as well as increased activity. Also discussed caloric restrictions with  trying to maintain intake less than 2000 Kcal. Discussions were made in accordance with the 5As of weight management. Simple actions such as not eating late and if able to, taking a walk is suggested.    General Counseling: I have discussed the findings of the evaluation and examination with Mason Houston.  I have also discussed any further diagnostic evaluation thatmay be needed or ordered today. Mason Houston verbalizes understanding of the findings of todays visit. We also reviewed his medications today and discussed drug interactions  and side effects including but not limited excessive drowsiness and altered mental states. We also discussed that there is always a risk not just to him but also people around him. he has been encouraged to call the office with any questions or concerns that should arise related to todays visit.  Orders Placed This Encounter  Procedures   CT Chest Wo Contrast    Standing Status:   Future    Standing Expiration Date:   03/24/2023    Order Specific Question:   Preferred imaging location?    Answer:   [facility]   Pulmonary Function Test    Standing Status:   Future    Standing Expiration Date:   03/24/2023    Order Specific Question:   Where should this test be performed?    Answer:   [facility]    Order Specific Question:   Full PFT: includes the following: basic spirometry, spirometry pre & post bronchodilator, diffusion capacity (DLCO), lung volumes    Answer:   Full PFT     Time spent: 46  I have personally obtained a history, examined the patient, evaluated laboratory and imaging results, formulated the assessment and plan and placed orders. This patient was seen by Drema Dallas, PA-C in collaboration with Dr. Devona Konig as a part of collaborative care agreement.     Allyne Gee, MD [facility] Pulmonary and Critical Care Sleep medicine

## 2022-03-24 NOTE — Patient Instructions (Signed)

## 2022-03-24 NOTE — Telephone Encounter (Signed)
Notified patient of chest CT appointment date, time & location-Mason Houston

## 2022-03-27 ENCOUNTER — Ambulatory Visit: Payer: Medicare HMO | Attending: Cardiovascular Disease | Admitting: Cardiovascular Disease

## 2022-03-27 ENCOUNTER — Encounter: Payer: Self-pay | Admitting: Cardiovascular Disease

## 2022-03-27 VITALS — BP 112/80 | HR 76 | Ht 62.0 in | Wt 284.2 lb

## 2022-03-27 DIAGNOSIS — I10 Essential (primary) hypertension: Secondary | ICD-10-CM

## 2022-03-27 DIAGNOSIS — E785 Hyperlipidemia, unspecified: Secondary | ICD-10-CM

## 2022-03-27 DIAGNOSIS — I482 Chronic atrial fibrillation, unspecified: Secondary | ICD-10-CM | POA: Diagnosis not present

## 2022-03-27 NOTE — Patient Instructions (Signed)
Medication Instructions:  No changes *If you need a refill on your cardiac medications before your next appointment, please call your pharmacy*   Lab Work: None ordered If you have labs (blood work) drawn today and your tests are completely normal, you will receive your results only by: MyChart Message (if you have MyChart) OR A paper copy in the mail If you have any lab test that is abnormal or we need to change your treatment, we will call you to review the results.   Testing/Procedures: None ordered   Follow-Up: At [facility], you and your health needs are our priority.  As part of our continuing mission to provide you with exceptional heart care, we have created designated Provider Care Teams.  These Care Teams include your primary Cardiologist (physician) and Advanced Practice Providers (APPs -  Physician Assistants and Nurse Practitioners) who all work together to provide you with the care you need, when you need it.  We recommend signing up for the patient portal called "MyChart".  Sign up information is provided on this After Visit Summary.  MyChart is used to connect with patients for Virtual Visits (Telemedicine).  Patients are able to view lab/test results, encounter notes, upcoming appointments, etc.  Non-urgent messages can be sent to your provider as well.   To learn more about what you can do with MyChart, go to [email or web address].    Your next appointment:   6 month(s)  Provider:   You may see Muhammad Arida, MD or one of the following Advanced Practice Providers on your designated Care Team:   Christopher Berge, NP Ryan Dunn, PA-C Cadence Furth, PA-C Sheri Hammock, NP    

## 2022-03-27 NOTE — Progress Notes (Signed)
Cardiology Office Note   Date:  03/27/2022   ID:  Mason Houston, DOB [birth year], MRN [id number]  PCP:  Baxter Hire, MD  Cardiologist:   Kathlyn Sacramento, MD   Chief Complaint  Patient presents with   OTHER    6 month f/u pt would like to discuss possible edema ankles/legs. Meds reviewed verbally with pt.       History of Present Illness: Wiliam Houston is a 72 y.o. male who presents for a followup visit regarding chronic atrial fibrillation. The patient has a long history of  hypertension and obesity. He was diagnosed with atrial fibrillation with rapid ventricular response during a routine physical  in December of 2013 in the setting of heavy caffeine intake. He also has sleep apnea currently effectively treated with CPAP.  He had a stroke in August 2015.   He was diagnosed with non-Hodgkin's lymphoma in 2017 and was treated successfully with chemotherapy.   He is being treated with rate control for atrial fibrillation given lack of symptoms.  Most recent echocardiogram in August 2022 showed normal LV systolic function and mild mitral regurgitation.   He has been stable from a cardiac standpoint but reports worsening exertional dyspnea and expiratory wheezing.  No chest pain or palpitations.  He is being seen by pulmonary with plans for CT scan of the lungs and pulmonary function testing.   Past Medical History:  Diagnosis Date   Atrial fibrillation ([place])    Cancer ([place])    Collagen vascular disease ([place])    Depression    Diabetes mellitus without complication ([place])    Dysrhythmia    Fibromyalgia    GERD (gastroesophageal reflux disease)    H/O Bell's palsy    History of German measles    Hypertension    Mini stroke    Non Hodgkin's lymphoma ([place]) 2017   Osteoarthrosis, unspecified whether generalized or localized, lower leg    Sixth cranial nerve palsy    Sleep apnea    Stroke [facility])     Past Surgical History:  Procedure Laterality Date   COLONOSCOPY   2012   Dr Candace Cruise   COLONOSCOPY WITH PROPOFOL N/A 09/09/2017   Procedure: COLONOSCOPY WITH PROPOFOL;  Surgeon: Toledo, Benay Pike, MD;  Location: [facility] ENDOSCOPY;  Service: Gastroenterology;  Laterality: N/A;   COLONOSCOPY WITH PROPOFOL N/A 09/21/2018   Procedure: COLONOSCOPY WITH PROPOFOL;  Surgeon: Toledo, Benay Pike, MD;  Location: [facility] ENDOSCOPY;  Service: Gastroenterology;  Laterality: N/A;   CYST EXCISION     ERCP N/A 12/29/2014   Procedure: ENDOSCOPIC RETROGRADE CHOLANGIOPANCREATOGRAPHY (ERCP);  Surgeon: Hulen Luster, MD;  Location[facility] ENDOSCOPY;  Service: Endoscopy;  Laterality: N/A;   ERCP N/A 02/21/2015   Procedure: ENDOSCOPIC RETROGRADE CHOLANGIOPANCREATOGRAPHY (ERCP);  Surgeon: Hulen Luster, MD;  Location[facility] ENDOSCOPY;  Service: Gastroenterology;  Laterality: N/A;   ERCP N/A 08/14/2015   Procedure: ENDOSCOPIC RETROGRADE CHOLANGIOPANCREATOGRAPHY (ERCP) Stent removal;  Surgeon: [name], MD;  Location: [facility] ENDOSCOPY;  Service: Endoscopy;  Laterality: N/A;   PERIPHERAL VASCULAR CATHETERIZATION N/A 01/31/2015   Procedure: Glori Luis Cath Insertion;  Surgeon: Katha Cabal, MD;  Location: [place] CV LAB;  Service: Cardiovascular;  Laterality: N/A;   PERIPHERAL VASCULAR CATHETERIZATION N/A 12/18/2015   Procedure: Glori Luis Cath Removal;  Surgeon: Katha Cabal, MD;  Location: [place] [facility] LAB;  Service: Cardiovascular;  Laterality: N/A;   TRIGGER FINGER RELEASE       Current Outpatient Medications  Medication Sig Dispense Refill   ACETAMINOPHEN  ER PO Take 1,000 mg by mouth daily.     Ascorbic Acid (VITAMIN C) 1000 MG tablet Take 1,000 mg by mouth daily.     b complex vitamins capsule Take 1 capsule by mouth daily.      cholecalciferol (VITAMIN D) 1000 units tablet Take 1,000 Units by mouth daily.     diltiazem (TIAZAC) 360 MG 24 hr capsule Take 1 capsule by mouth once daily 90 capsule 1   glipiZIDE (GLUCOTROL) 10 MG tablet Take 10 mg by mouth daily before breakfast.     loratadine  (CLARITIN) 10 MG tablet Take 10 mg by mouth daily as needed.      losartan (COZAAR) 50 MG tablet Take 2 tablets by mouth once daily 180 tablet 0   magnesium oxide (MAG-OX) 400 (241.3 Mg) MG tablet Take 250 mg daily by mouth.      metFORMIN (GLUCOPHAGE) 1000 MG tablet Take 1,000 mg by mouth 2 (two) times daily.     omeprazole (PRILOSEC) 20 MG capsule Take 1 capsule by mouth once daily 90 capsule 0   rivaroxaban (XARELTO) 20 MG TABS tablet TAKE 1 TABLET BY MOUTH ONCE DAILY WITH SUPPER 90 tablet 5   rosuvastatin (CRESTOR) 10 MG tablet TAKE 1 TABLET(10 MG) BY MOUTH DAILY 90 tablet 0   No current facility-administered medications for this visit.    Allergies:   Celebrex [celecoxib], Other, and Penicillins    Social History:  The patient  reports that he quit smoking about 41 years ago. His smoking use included cigarettes. He has a 67.50 pack-year smoking history. He has never used smokeless tobacco. He reports that he does not drink alcohol and does not use drugs.   Family History:  The patient's family history includes Diabetes in his paternal grandfather and paternal grandmother; Heart attack in his father; Hypertension in his mother; Stroke in his father.    ROS:  Please see the history of present illness.   Otherwise, review of systems are positive for none.   All other systems are reviewed and negative.    PHYSICAL EXAM: VS:  BP 112/80 (BP Location: Left Arm, Patient Position: Sitting, Cuff Size: Large)   Pulse 76   Ht 5\' 2"  (1.575 m)   Wt 284 lb 4 oz (128.9 kg)   SpO2 98%   BMI 51.99 kg/m  , BMI Body mass index is 51.99 kg/m. GEN: Well nourished, well developed, in no acute distress  HEENT: normal  Neck: no JVD, carotid bruits, or masses Cardiac: Irregularly irregular; no murmurs, rubs, or gallops, trace leg edema  Respiratory:  clear to auscultation bilaterally, normal work of breathing GI: soft, nontender, nondistended, + BS MS: no deformity or atrophy  Skin: warm and dry, no  rash Neuro:  Strength and sensation are intact Psych: euthymic mood, full affect   EKG:  EKG is  ordered today. EKG showed atrial fibrillation with right bundle branch block.  Ventricular rate 76 bpm.  Recent Labs: 06/13/2021: ALT 18; BUN 18; Creatinine, Ser 1.13; Hemoglobin 13.5; Platelets 329; Potassium 5.2; Sodium 139    Lipid Panel    Component Value Date/Time   CHOL 139 05/08/2016 1152   CHOL 181 07/03/2015 1007   TRIG 69 05/08/2016 1152   HDL 42 05/08/2016 1152   HDL 44 07/03/2015 1007   CHOLHDL 3.3 05/08/2016 1152   VLDL 14 05/08/2016 1152   LDLCALC 83 05/08/2016 1152   LDLCALC 122 (H) 07/03/2015 1007      Wt Readings from Last 3  Encounters:  03/27/22 284 lb 4 oz (128.9 kg)  03/24/22 283 lb (128.4 kg)  09/26/21 275 lb 2 oz (124.8 kg)        ASSESSMENT AND PLAN:   1.    Chronic atrial fibrillation:  Ventricular rate is controlled with diltiazem.  He is tolerating anticoagulation with Xarelto with no side effects.  I reviewed most recent labs done in January which showed a GFR of 59 and mild anemia with a hemoglobin of 13.5.   2. Essential hypertension: His blood pressure is well controlled on diltiazem and losartan.   3.  Hyperlipidemia: Most recent lipid profile in January showed an LDL of 55.  Continue rosuvastatin.   4. Sleep apnea: on CPAP  5. Morbid obesity: I again discussed with him the importance of at least 30 minutes of exercise daily and healthy diet.  7.  Lower extremity edema: This is overall mild and likely due to diltiazem.  No signs of heart failure.    Disposition:   FU with me in 6 months  Signed,  Kathlyn Sacramento, MD  03/27/2022 9:29 AM    [facility]

## 2022-03-29 ENCOUNTER — Other Ambulatory Visit: Payer: Self-pay | Admitting: Cardiovascular Disease

## 2022-04-03 ENCOUNTER — Ambulatory Visit
Admission: RE | Admit: 2022-04-03 | Discharge: 2022-04-03 | Disposition: A | Discharge status: Home / Self Care | Payer: Medicare HMO | Source: Ambulatory Visit | Attending: Physician Assistant | Admitting: Physician Assistant

## 2022-04-03 DIAGNOSIS — R0602 Shortness of breath: Secondary | ICD-10-CM | POA: Diagnosis present

## 2022-04-09 ENCOUNTER — Ambulatory Visit (INDEPENDENT_AMBULATORY_CARE_PROVIDER_SITE_OTHER): Payer: Medicare HMO | Admitting: Internal Medicine

## 2022-04-09 DIAGNOSIS — R0602 Shortness of breath: Secondary | ICD-10-CM

## 2022-04-18 ENCOUNTER — Ambulatory Visit: Payer: Medicare HMO | Admitting: Physician Assistant

## 2022-04-18 ENCOUNTER — Encounter: Payer: Self-pay | Admitting: Physician Assistant

## 2022-04-18 VITALS — BP 140/83 | HR 76 | Temp 98.0°F | Resp 16 | Ht 67.0 in | Wt 285.0 lb

## 2022-04-18 DIAGNOSIS — J984 Other disorders of lung: Secondary | ICD-10-CM

## 2022-04-18 DIAGNOSIS — Z6841 Body Mass Index (BMI) 40.0 and over, adult: Secondary | ICD-10-CM | POA: Diagnosis not present

## 2022-04-18 DIAGNOSIS — R0602 Shortness of breath: Secondary | ICD-10-CM

## 2022-04-18 MED ORDER — MOMETASONE FURO-FORMOTEROL FUM 100-5 MCG/ACT IN AERO: 2.0000 | INHALATION_SPRAY | Freq: Two times a day (BID) | RESPIRATORY_TRACT | 0 refills | Status: DC

## 2022-04-18 NOTE — Progress Notes (Signed)
[facility] [address] [place], [place] [address]  Pulmonary Sleep Medicine   Office Visit Note  Patient Name: Mason Houston DOB: 07-14-1950 MRN [id number]  Date of Service: 04/29/2022  Complaints/HPI: Pt is here for routine pulmonary follow up. CT reviewed. PFT consistent with mild restriction. Discussed with Dr. Welton Flakes over the phone and given pt symptoms may trial inhaler though really needs to focus on weight loss. His breathing is worse when bending over to tie shoes likely further restricting.  CT Chest 04/03/22 IMPRESSION: 1. Several subcentimeter right-sided pulmonary nodules, finding that is similar to the prior study from 2017. Consider follow up CT in 12 months as a precaution. 2. Mildly ectatic lower lobe bronchi consistent with chronic bronchitis. 3. Atheromatous calcifications. 4. Left kidney cysts that were partially imaged.  ROS  General: (-) fever, (-) chills, (-) night sweats, (-) weakness Skin: (-) rashes, (-) itching,. Eyes: (-) visual changes, (-) redness, (-) itching. Nose and Sinuses: (-) nasal stuffiness or itchiness, (-) postnasal drip, (-) nosebleeds, (-) sinus trouble. Mouth and Throat: (-) sore throat, (-) hoarseness. Neck: (-) swollen glands, (-) enlarged thyroid, (-) neck pain. Respiratory: - cough, (-) bloody sputum, + shortness of breath, + wheezing. Cardiovascular: - ankle swelling, (-) chest pain. Lymphatic: (-) lymph node enlargement. Neurologic: (-) numbness, (-) tingling. Psychiatric: (-) anxiety, (-) depression   Current Medication: Outpatient Encounter Medications as of 04/18/2022  Medication Sig   ACETAMINOPHEN ER PO Take 1,000 mg by mouth daily.   Ascorbic Acid (VITAMIN C) 1000 MG tablet Take 1,000 mg by mouth daily.   b complex vitamins capsule Take 1 capsule by mouth daily.    cholecalciferol (VITAMIN D) 1000 units tablet Take 1,000 Units by mouth daily.   diltiazem (TIAZAC) 360 MG 24 hr capsule Take 1 capsule by  mouth once daily   glipiZIDE (GLUCOTROL) 10 MG tablet Take 10 mg by mouth daily before breakfast.   loratadine (CLARITIN) 10 MG tablet Take 10 mg by mouth daily as needed.    losartan (COZAAR) 50 MG tablet Take 2 tablets by mouth once daily   magnesium oxide (MAG-OX) 400 (241.3 Mg) MG tablet Take 250 mg daily by mouth.    metFORMIN (GLUCOPHAGE) 1000 MG tablet Take 1,000 mg by mouth 2 (two) times daily.   mometasone-formoterol (DULERA) 100-5 MCG/ACT AERO Inhale 2 puffs into the lungs 2 (two) times daily.   omeprazole (PRILOSEC) 20 MG capsule Take 1 capsule by mouth once daily   rivaroxaban (XARELTO) 20 MG TABS tablet TAKE 1 TABLET BY MOUTH ONCE DAILY WITH SUPPER   rosuvastatin (CRESTOR) 10 MG tablet TAKE 1 TABLET(10 MG) BY MOUTH DAILY   No facility-administered encounter medications on file as of 04/18/2022.    Surgical History: Past Surgical History:  Procedure Laterality Date   COLONOSCOPY  2012   Dr Bluford Kaufmann   COLONOSCOPY WITH PROPOFOL N/A 09/09/2017   Procedure: COLONOSCOPY WITH PROPOFOL;  Surgeon: Toledo, Boykin Nearing, MD;  Location: [facility] ENDOSCOPY;  Service: Gastroenterology;  Laterality: N/A;   COLONOSCOPY WITH PROPOFOL N/A 09/21/2018   Procedure: COLONOSCOPY WITH PROPOFOL;  Surgeon: Toledo, Boykin Nearing, MD;  Location: [facility] ENDOSCOPY;  Service: Gastroenterology;  Laterality: N/A;   CYST EXCISION     ERCP N/A 12/29/2014   Procedure: ENDOSCOPIC RETROGRADE CHOLANGIOPANCREATOGRAPHY (ERCP);  Surgeon: Wallace Cullens, MD;  Location: [facility] ENDOSCOPY;  Service: Endoscopy;  Laterality: N/A;   ERCP N/A 02/21/2015   Procedure: ENDOSCOPIC RETROGRADE CHOLANGIOPANCREATOGRAPHY (ERCP);  Surgeon: Wallace Cullens, MD;  Location: [facility] ENDOSCOPY;  Service: Gastroenterology;  Laterality: N/A;   ERCP N/A 08/14/2015   Procedure: ENDOSCOPIC RETROGRADE CHOLANGIOPANCREATOGRAPHY (ERCP) Stent removal;  Surgeon: Midge Minium, MD;  Location: [facility] ENDOSCOPY;  Service: Endoscopy;  Laterality: N/A;   PERIPHERAL VASCULAR CATHETERIZATION N/A  01/31/2015   Procedure: Shelda Pal Cath Insertion;  Surgeon: Renford Dills, MD;  Location: [facility] INVASIVE CV LAB;  Service: Cardiovascular;  Laterality: N/A;   PERIPHERAL VASCULAR CATHETERIZATION N/A 12/18/2015   Procedure: Shelda Pal Cath Removal;  Surgeon: Renford Dills, MD;  Location: [facility] INVASIVE CV LAB;  Service: Cardiovascular;  Laterality: N/A;   TRIGGER FINGER RELEASE      Medical History: Past Medical History:  Diagnosis Date   Atrial fibrillation    Cancer    Collagen vascular disease    Depression    Diabetes mellitus without complication    Dysrhythmia    Fibromyalgia    GERD (gastroesophageal reflux disease)    H/O Bell's palsy    History of German measles    Hypertension    Mini stroke    Non Hodgkin's lymphoma 2017   Osteoarthrosis, unspecified whether generalized or localized, lower leg    Sixth cranial nerve palsy    Sleep apnea    Stroke     Family History: Family History  Problem Relation Age of Onset   Hypertension Mother    Heart attack Father    Stroke Father    Diabetes Paternal Grandmother    Diabetes Paternal Grandfather     Social History: Social History   Socioeconomic History   Marital status: Married    Spouse name: Not on file   Number of children: Not on file   Years of education: Not on file   Highest education level: Not on file  Occupational History   Not on file  Tobacco Use   Smoking status: Former    Packs/day: 4.50    Years: 15.00    Additional pack years: 0.00    Total pack years: 67.50    Types: Cigarettes    Quit date: 01/06/1981    Years since quitting: 41.3   Smokeless tobacco: Never  Vaping Use   Vaping Use: Never used  Substance and Sexual Activity   Alcohol use: No   Drug use: No    Types: Marijuana    Comment: PAST   Sexual activity: Not on file  Other Topics Concern   Not on file  Social History Narrative   Not on file   Social Determinants of Health   Financial Resource Strain: Not on file  Food  Insecurity: Not on file  Transportation Needs: Not on file  Physical Activity: Not on file  Stress: Not on file  Social Connections: Not on file  Intimate Partner Violence: Not on file    Vital Signs: Blood pressure (!) 140/83, pulse 76, temperature 98 F (36.7 C), resp. rate 16, height  (1.702 m), weight 285 lb (129.3 kg), SpO2 95 %.  Examination: General Appearance: The patient is well-developed, well-nourished, and in no distress. Skin: Gross inspection of skin unremarkable. Head: normocephalic, no gross deformities. Eyes: no gross deformities noted. ENT: ears appear grossly normal no exudates. Neck: Supple. No thyromegaly. No LAD. Respiratory: Lungs clear to auscultation bilaterally. Cardiovascular: Normal S1 and S2 without murmur or rub. Extremities: No cyanosis. pulses are equal. Neurologic: Alert and oriented. No involuntary movements.  LABS: No results found for this or any previous visit (from the past 2160 hour(s)).  Radiology: CT Chest Wo Contrast  Result Date:  04/05/2022 CLINICAL DATA:  SOB EXAM: CT CHEST WITHOUT CONTRAST TECHNIQUE: Multidetector CT imaging of the chest was performed following the standard protocol without IV contrast. RADIATION DOSE REDUCTION: This exam was performed according to the departmental dose-optimization program which includes automated exposure control, adjustment of the mA and/or kV according to patient size and/or use of iterative reconstruction technique. COMPARISON:  05/07/2015 FINDINGS: Cardiovascular: Atheromatous calcifications coronary arteries. No pericardial effusion. No cardiomegaly. Mediastinum/Nodes: No enlarged mediastinal or axillary lymph nodes. Thyroid gland, trachea, and esophagus demonstrate no significant findings. Lungs/Pleura: Right apical nodule measures 5 mm. This represents an increase in size compared to 2017. Additional right upper lobe nodules measuring 4 mm and 3 mm. No pneumonia or pulmonary edema. No  pneumothorax or pleural effusion. Bilateral basilar ectatic bronchi consistent with chronic bronchitis. Upper Abdomen: Partially imaged renal cysts, the largest measuring at least 6 cm. Otherwise no acute abnormalities. Musculoskeletal: There are thoracic degenerative changes. IMPRESSION: 1. Several subcentimeter right-sided pulmonary nodules, finding that is similar to the prior study from 2017. Consider follow up CT in 12 months as a precaution. 2. Mildly ectatic lower lobe bronchi consistent with chronic bronchitis. 3. Atheromatous calcifications. 4. Left kidney cysts that were partially imaged. Electronically Signed   By: Layla Maw M.D.   On: 04/05/2022 14:48    No results found.  CT Chest Wo Contrast  Result Date: 04/05/2022 CLINICAL DATA:  SOB EXAM: CT CHEST WITHOUT CONTRAST TECHNIQUE: Multidetector CT imaging of the chest was performed following the standard protocol without IV contrast. RADIATION DOSE REDUCTION: This exam was performed according to the departmental dose-optimization program which includes automated exposure control, adjustment of the mA and/or kV according to patient size and/or use of iterative reconstruction technique. COMPARISON:  05/07/2015 FINDINGS: Cardiovascular: Atheromatous calcifications coronary arteries. No pericardial effusion. No cardiomegaly. Mediastinum/Nodes: No enlarged mediastinal or axillary lymph nodes. Thyroid gland, trachea, and esophagus demonstrate no significant findings. Lungs/Pleura: Right apical nodule measures 5 mm. This represents an increase in size compared to 2017. Additional right upper lobe nodules measuring 4 mm and 3 mm. No pneumonia or pulmonary edema. No pneumothorax or pleural effusion. Bilateral basilar ectatic bronchi consistent with chronic bronchitis. Upper Abdomen: Partially imaged renal cysts, the largest measuring at least 6 cm. Otherwise no acute abnormalities. Musculoskeletal: There are thoracic degenerative changes. IMPRESSION:  1. Several subcentimeter right-sided pulmonary nodules, finding that is similar to the prior study from 2017. Consider follow up CT in 12 months as a precaution. 2. Mildly ectatic lower lobe bronchi consistent with chronic bronchitis. 3. Atheromatous calcifications. 4. Left kidney cysts that were partially imaged. Electronically Signed   By: Layla Maw M.D.   On: 04/05/2022 14:48      Assessment and Plan: Patient Active Problem List   Diagnosis Date Noted   Abdominal abscess 12/13/2015   Diffuse large b-cell lymphoma, intra-abdominal lymph nodes 12/06/2015   Localized skin mass, lump, or swelling 11/09/2015   Internal nasal lesion 10/30/2015   Fitting and adjustment of gastrointestinal appliance and device    Disease of biliary tract    History of stroke 07/25/2015   Annual physical exam 07/03/2015   Pneumonia 05/15/2015   Fever 05/15/2015   Hypoxia 05/07/2015   Pancreatic mass 01/12/2015   Obstructive jaundice 12/28/2014   Arthritis 06/09/2014   Benign essential HTN 10/04/2013   Cerebellar infarction 10/04/2013   Acid reflux 10/04/2013   Arthritis, degenerative 10/04/2013   HLD (hyperlipidemia) 10/04/2013   H/O disease 10/04/2013   Apnea, sleep 10/04/2013   Type  2 diabetes mellitus 10/04/2013   Stroke 09/08/2013   Obstructive sleep apnea 02/03/2013   Depression 07/05/2012   Major depressive disorder with single episode 07/05/2012   Hyperlipidemia LDL goal <100 12/23/2011   Atrial fibrillation    Hypertension     1. SOB (shortness of breath) Will trial dulera and schedule follow up with Dr. Welton Flakes - mometasone-formoterol [facility]) 100-5 MCG/ACT AERO; Inhale 2 puffs into the lungs 2 (two) times daily.  Dispense: 13 g; Refill: 0  2. Restrictive lung disease Trial dulera and schedule follow up with Dr. Welton Flakes to discuss further - mometasone-formoterol (DULERA) 100-5 MCG/ACT AERO; Inhale 2 puffs into the lungs 2 (two) times daily.  Dispense: 13 g; Refill: 0  3. Morbid  obesity with BMI of 40.0-44.9, adult Discussed importance of working on weight loss goals    General Counseling: I have discussed the findings of the evaluation and examination with Renae Fickle.  I have also discussed any further diagnostic evaluation thatmay be needed or ordered today. Ashely verbalizes understanding of the findings of todays visit. We also reviewed his medications today and discussed drug interactions and side effects including but not limited excessive drowsiness and altered mental states. We also discussed that there is always a risk not just to him but also people around him. he has been encouraged to call the office with any questions or concerns that should arise related to todays visit.  No orders of the defined types were placed in this encounter.    Time spent: 30  I have personally obtained a history, examined the patient, evaluated laboratory and imaging results, formulated the assessment and plan and placed orders. This patient was seen by Lynn Ito, PA-C in collaboration with Dr. Freda Munro as a part of collaborative care agreement.     Yevonne Pax, MD [facility] Pulmonary and Critical Care Sleep medicine

## 2022-05-13 NOTE — Procedures (Signed)
[facility] [address] [place], [address]    Complete Pulmonary Function Testing Interpretation:  FINDINGS:  Forced vital capacity is mildly decreased.  FEV1 is normal.  FEV1 FVC ratio is normal.  Total lung capacity is moderately decreased.  Residual volume is decreased.  FRC is decreased.  DLCO is normal.  Postbronchodilator no significant change in FEV1 was noted.  IMPRESSION:  This pulmonary function study is suggestive of a moderate restrictive lung disease clinical correlation is recommended  Yevonne Pax, MD [facility] Pulmonary Critical Care Medicine Sleep Medicine

## 2022-06-04 ENCOUNTER — Telehealth: Payer: Self-pay | Admitting: Internal Medicine

## 2022-06-04 NOTE — Telephone Encounter (Signed)
Lvm to move 06/16/22 appointment earlier in morning-Toni

## 2022-06-16 ENCOUNTER — Encounter: Payer: Self-pay | Admitting: Internal Medicine

## 2022-06-16 ENCOUNTER — Ambulatory Visit: Payer: Medicare HMO | Admitting: Internal Medicine

## 2022-06-16 VITALS — BP 130/80 | HR 87 | Temp 97.8°F | Resp 16 | Ht 67.0 in | Wt 279.0 lb

## 2022-06-16 DIAGNOSIS — Z6841 Body Mass Index (BMI) 40.0 and over, adult: Secondary | ICD-10-CM

## 2022-06-16 DIAGNOSIS — G4733 Obstructive sleep apnea (adult) (pediatric): Secondary | ICD-10-CM

## 2022-06-16 DIAGNOSIS — Z7189 Other specified counseling: Secondary | ICD-10-CM | POA: Diagnosis not present
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
## 2022-06-16 NOTE — Patient Instructions (Signed)

## 2022-06-17 ENCOUNTER — Inpatient Hospital Stay: Payer: Medicare HMO | Admitting: Internal Medicine

## 2022-06-17 ENCOUNTER — Telehealth: Payer: Self-pay | Admitting: *Deleted

## 2022-06-17 ENCOUNTER — Inpatient Hospital Stay: Payer: Medicare HMO | Attending: Internal Medicine

## 2022-06-17 ENCOUNTER — Other Ambulatory Visit: Payer: Medicare HMO

## 2022-06-17 ENCOUNTER — Ambulatory Visit: Payer: Medicare HMO | Admitting: Internal Medicine

## 2022-06-17 ENCOUNTER — Encounter: Payer: Self-pay | Admitting: Internal Medicine

## 2022-06-17 VITALS — BP 156/85 | HR 70 | Temp 96.1°F | Ht 67.0 in | Wt 276.9 lb

## 2022-06-17 DIAGNOSIS — Z7984 Long term (current) use of oral hypoglycemic drugs: Secondary | ICD-10-CM | POA: Diagnosis not present

## 2022-06-17 DIAGNOSIS — E669 Obesity, unspecified: Secondary | ICD-10-CM | POA: Diagnosis not present

## 2022-06-17 DIAGNOSIS — Z8572 Personal history of non-Hodgkin lymphomas: Secondary | ICD-10-CM | POA: Insufficient documentation

## 2022-06-17 DIAGNOSIS — Z7901 Long term (current) use of anticoagulants: Secondary | ICD-10-CM | POA: Insufficient documentation

## 2022-06-17 DIAGNOSIS — C8333 Diffuse large B-cell lymphoma, intra-abdominal lymph nodes: Secondary | ICD-10-CM

## 2022-06-17 DIAGNOSIS — R918 Other nonspecific abnormal finding of lung field: Secondary | ICD-10-CM | POA: Insufficient documentation

## 2022-06-17 DIAGNOSIS — Z87891 Personal history of nicotine dependence: Secondary | ICD-10-CM | POA: Insufficient documentation

## 2022-06-17 DIAGNOSIS — E119 Type 2 diabetes mellitus without complications: Secondary | ICD-10-CM | POA: Insufficient documentation

## 2022-06-17 DIAGNOSIS — I480 Paroxysmal atrial fibrillation: Secondary | ICD-10-CM | POA: Diagnosis not present

## 2022-06-17 LAB — CBC WITH DIFFERENTIAL (CANCER CENTER ONLY)
Abs Immature Granulocytes: 0.01 10*3/uL (ref 0.00–0.07)
Basophils Absolute: 0.1 10*3/uL (ref 0.0–0.1)
Basophils Relative: 1 %
Eosinophils Absolute: 0.3 10*3/uL (ref 0.0–0.5)
Eosinophils Relative: 4 %
HCT: 41.6 % (ref 39.0–52.0)
Hemoglobin: 13.6 g/dL (ref 13.0–17.0)
Immature Granulocytes: 0 %
Lymphocytes Relative: 25 %
Lymphs Abs: 1.7 10*3/uL (ref 0.7–4.0)
MCH: 27.9 pg (ref 26.0–34.0)
MCHC: 32.7 g/dL (ref 30.0–36.0)
MCV: 85.2 fL (ref 80.0–100.0)
Monocytes Absolute: 0.8 10*3/uL (ref 0.1–1.0)
Monocytes Relative: 12 %
Neutro Abs: 4.1 10*3/uL (ref 1.7–7.7)
Neutrophils Relative %: 58 %
Platelet Count: 394 10*3/uL (ref 150–400)
RBC: 4.88 MIL/uL (ref 4.22–5.81)
RDW: 13.1 % (ref 11.5–15.5)
WBC Count: 6.9 10*3/uL (ref 4.0–10.5)
nRBC: 0 % (ref 0.0–0.2)

## 2022-06-17 LAB — CMP (CANCER CENTER ONLY)
ALT: 19 U/L (ref 0–44)
AST: 25 U/L (ref 15–41)
Albumin: 4.2 g/dL (ref 3.5–5.0)
Alkaline Phosphatase: 82 U/L (ref 38–126)
Anion gap: 7 (ref 5–15)
BUN: 22 mg/dL (ref 8–23)
CO2: 25 mmol/L (ref 22–32)
Calcium: 9.4 mg/dL (ref 8.9–10.3)
Chloride: 105 mmol/L (ref 98–111)
Creatinine: 1.17 mg/dL (ref 0.61–1.24)
GFR, Estimated: >60 mL/min (ref >60)
Glucose, Bld: 126 mg/dL — ABNORMAL HIGH (ref 70–99)
Potassium: 4.6 mmol/L (ref 3.5–5.1)
Sodium: 137 mmol/L (ref 135–145)
Total Bilirubin: 0.5 mg/dL (ref 0.3–1.2)
Total Protein: 7.6 g/dL (ref 6.5–8.1)

## 2022-06-17 LAB — LACTATE DEHYDROGENASE: LDH: 136 U/L (ref 98–192)
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
## 2022-06-17 NOTE — Assessment & Plan Note (Addendum)
#   DLBCL of pancreatic head-  Stage IE. S/p R-CHOP [finished 2017 April].  STABLE  Currently no evidence of disease; monitor clinically.  Labs within normal limits.  Discussed with patient that is likely cured.  We will continue follow-up with out imaging/on a clinical basis.  Stable.  # DM- FBS-126   continue metformin/Glucotrol; discussed regarding dietary interventions /reducing carbohydrates.  Continue close follow-up with PCP. Stable.   # Paroxysmal A.fib- on xarelto-[Dr.Arida] stable no bleeding.discussed fall precautions- Stable.   # CT scan- MARCH 2024 [Pulmonary]- Bil lung nodules- 3-4 mm- stable; rena cysts- likely benign- stable. Defer to pul.   #  Obesity: Discussed importance of healthy weight/and weight loss.  Strongly recommend eating more green leafy vegetables and cutting down processed food/ carbohydrates.  Instead increasing whole grains / protein in the diet.  Multiple studies have shown that optimal weight would help improve cardiovascular risk; also shown to cut on the risk of malignancies-colon cancer, breast cancer ovarian/uterine cancer in women and also prostate cancer in men.  Patient is motivated he has been trying to lose weight/congratulated on the patient's weight loss journey.  # Vaccination: Okay with RSV vaccination.  # DISPOSITION:  # Follow up in 12  months -MD-labs-cbc/cmp/dh-Dr.B.   # 25 minutes face-to-face with the patient discussing the above plan of care; more than 50% of time spent on prognosis/ natural history; counseling and coordination.

## 2022-06-17 NOTE — Addendum Note (Signed)
Addended by: Clydia Llano on: 06/17/2022 11:35 AM   Modules accepted: Orders

## 2022-06-17 NOTE — Telephone Encounter (Signed)
Called patient per Dr. Sharlette Dense request.  Left message that it is ok for him to get the RSV vaccine.
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
## 2022-07-07 LAB — PULMONARY FUNCTION TEST

## 2022-08-06 ENCOUNTER — Encounter: Payer: Self-pay | Admitting: Ophthalmology

## 2022-08-07 ENCOUNTER — Encounter: Payer: Self-pay | Admitting: Ophthalmology

## 2022-08-07 NOTE — Anesthesia Preprocedure Evaluation (Addendum)
Anesthesia Evaluation  Patient identified by MRN, date of birth, ID band Patient awake    Reviewed: Allergy & Precautions, NPO status , Patient's Chart, lab work & pertinent test results  History of Anesthesia Complications Negative for: history of anesthetic complications  Airway Mallampati: III  TM Distance: >3 FB Neck ROM: full    Dental  (+) Upper Dentures   Pulmonary sleep apnea and Continuous Positive Airway Pressure Ventilation , former smoker   Pulmonary exam normal        Cardiovascular hypertension, On Medications + dysrhythmias Atrial Fibrillation + Valvular Problems/Murmurs MR   Echo 2022  IMPRESSIONS     1. Left ventricular ejection fraction, by estimation, is 55 to 60%. The  left ventricle has normal function. The left ventricle has no regional  wall motion abnormalities. Left ventricular diastolic parameters are  indeterminate.   2. Right ventricular systolic function is normal. The right ventricular  size is normal. There is normal pulmonary artery systolic pressure.   3. Left atrial size was mildly dilated.   4. The mitral valve is normal in structure. Mild mitral valve  regurgitation.   5. The aortic valve is grossly normal. Aortic valve regurgitation is not  visualized.   6. The inferior vena cava is normal in size with greater than 50%  respiratory variability, suggesting right atrial pressure of 3 mmHg.     Neuro/Psych  PSYCHIATRIC DISORDERS  Depression     Neuromuscular disease CVA    GI/Hepatic Neg liver ROS,GERD  Medicated,,  Endo/Other  diabetes  Morbid obesity  Renal/GU      Musculoskeletal  (+) Arthritis ,  Fibromyalgia -  Abdominal   Peds  Hematology negative hematology ROS (+)   Anesthesia Other Findings Past Medical History: No date: Atherosclerosis No date: Atrial fibrillation (HCC) No date: Cancer (HCC) No date: Chronic bronchitis (HCC) No date: Collagen vascular disease  (HCC) No date: Depression No date: Diabetes mellitus without complication (HCC) No date: Dysrhythmia No date: Fibromyalgia No date: GERD (gastroesophageal reflux disease) No date: H/O Bell's palsy No date: History of [place] measles No date: Hypertension No date: Mild mitral regurgitation by prior echocardiogram No date: Mild tricuspid regurgitation by prior echocardiogram No date: Mini stroke No date: Morbid obesity with BMI of 40.0-44.9, adult (HCC) 2017: Non Hodgkin's lymphoma (HCC) No date: Osteoarthrosis, unspecified whether generalized or localized,  lower leg 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 None since 2015 (went on Xarelto) No date: Vertigo No date: Wears dentures     Comment:  full upper  Past Surgical History: 2012: COLONOSCOPY     Comment:  Dr Bluford Kaufmann 09/09/2017: COLONOSCOPY WITH PROPOFOL; N/A     Comment:  Procedure: COLONOSCOPY WITH PROPOFOL;  Surgeon: Toledo,               Boykin Nearing, MD;  Location: [facility] ENDOSCOPY;  Service:               Gastroenterology;  Laterality: N/A; 09/21/2018: COLONOSCOPY WITH PROPOFOL; N/A     Comment:  Procedure: COLONOSCOPY WITH PROPOFOL;  Surgeon: Toledo,               Boykin Nearing, MD;  Location: [facility] ENDOSCOPY;  Service:               Gastroenterology;  Laterality: N/A; No date: CYST EXCISION 12/29/2014: ERCP; N/A  Comment:  Procedure: ENDOSCOPIC RETROGRADE               CHOLANGIOPANCREATOGRAPHY (ERCP);  Surgeon: Wallace Cullens, MD;              Location: [facility] ENDOSCOPY;  Service: Endoscopy;                Laterality: N/A; 02/21/2015: ERCP; N/A     Comment:  Procedure: ENDOSCOPIC RETROGRADE               CHOLANGIOPANCREATOGRAPHY (ERCP);  Surgeon: Wallace Cullens, MD;              Location: [facility] ENDOSCOPY;  Service: Gastroenterology;                Laterality: N/A; 08/14/2015: ERCP; N/A     Comment:  Procedure: ENDOSCOPIC RETROGRADE                CHOLANGIOPANCREATOGRAPHY (ERCP) Stent removal;  Surgeon:               Midge Minium, MD;  Location: [facility] ENDOSCOPY;  Service:               Endoscopy;  Laterality: N/A; 01/31/2015: PERIPHERAL VASCULAR CATHETERIZATION; N/A     Comment:  Procedure: Porta Cath Insertion;  Surgeon: Renford Dills, MD;  Location: [facility] INVASIVE CV LAB;  Service:               Cardiovascular;  Laterality: N/A; 12/18/2015: PERIPHERAL VASCULAR CATHETERIZATION; N/A     Comment:  Procedure: Porta Cath Removal;  Surgeon: Renford Dills, MD;  Location: [facility] INVASIVE CV LAB;  Service:               Cardiovascular;  Laterality: N/A; No date: TRIGGER FINGER RELEASE  BMI    Body Mass Index: 42.75 kg/m      Reproductive/Obstetrics negative OB ROS                             Anesthesia Physical Anesthesia Plan  ASA: 3  Anesthesia Plan: MAC   Post-op Pain Management: Minimal or no pain anticipated   Induction: Intravenous  PONV Risk Score and Plan:   Airway Management Planned: Natural Airway and Nasal Cannula  Additional Equipment:   Intra-op Plan:   Post-operative Plan:   Informed Consent: I have reviewed the patients History and Physical, chart, labs and discussed the procedure including the risks, benefits and alternatives for the proposed anesthesia with the patient or authorized representative who has indicated his/her understanding and acceptance.     Dental Advisory Given  Plan Discussed with: Anesthesiologist, CRNA and Surgeon  Anesthesia Plan Comments: (Patient consented for risks of anesthesia including but not limited to:  - adverse reactions to medications - damage to eyes, teeth, lips or other oral mucosa - nerve damage due to positioning  - sore throat or hoarseness - Damage to heart, brain, nerves, lungs, other parts of body or loss of life  Patient voiced understanding.)        Anesthesia Quick Evaluation

## 2022-08-11 NOTE — Discharge Instructions (Signed)

## 2022-08-13 ENCOUNTER — Encounter
Admission: RE | Disposition: A | Discharge status: Home / Self Care | Payer: Self-pay | Source: Home / Self Care | Attending: Ophthalmology

## 2022-08-13 ENCOUNTER — Ambulatory Visit: Payer: Medicare HMO | Admitting: Anesthesiology

## 2022-08-13 ENCOUNTER — Other Ambulatory Visit: Payer: Self-pay

## 2022-08-13 ENCOUNTER — Encounter: Payer: Self-pay | Admitting: Ophthalmology

## 2022-08-13 ENCOUNTER — Ambulatory Visit
Admission: RE | Admit: 2022-08-13 | Discharge: 2022-08-13 | Disposition: A | Discharge status: Home / Self Care | Payer: Medicare HMO | Attending: Ophthalmology | Admitting: Ophthalmology

## 2022-08-13 DIAGNOSIS — Z7901 Long term (current) use of anticoagulants: Secondary | ICD-10-CM | POA: Diagnosis not present

## 2022-08-13 DIAGNOSIS — I10 Essential (primary) hypertension: Secondary | ICD-10-CM | POA: Insufficient documentation

## 2022-08-13 DIAGNOSIS — Z7984 Long term (current) use of oral hypoglycemic drugs: Secondary | ICD-10-CM | POA: Diagnosis not present

## 2022-08-13 DIAGNOSIS — M797 Fibromyalgia: Secondary | ICD-10-CM | POA: Insufficient documentation

## 2022-08-13 DIAGNOSIS — G4730 Sleep apnea, unspecified: Secondary | ICD-10-CM | POA: Diagnosis not present

## 2022-08-13 DIAGNOSIS — H2512 Age-related nuclear cataract, left eye: Secondary | ICD-10-CM | POA: Diagnosis present

## 2022-08-13 DIAGNOSIS — I4891 Unspecified atrial fibrillation: Secondary | ICD-10-CM | POA: Diagnosis not present

## 2022-08-13 DIAGNOSIS — Z6841 Body Mass Index (BMI) 40.0 and over, adult: Secondary | ICD-10-CM | POA: Diagnosis not present

## 2022-08-13 DIAGNOSIS — K219 Gastro-esophageal reflux disease without esophagitis: Secondary | ICD-10-CM | POA: Insufficient documentation

## 2022-08-13 DIAGNOSIS — I081 Rheumatic disorders of both mitral and tricuspid valves: Secondary | ICD-10-CM | POA: Insufficient documentation

## 2022-08-13 DIAGNOSIS — E1136 Type 2 diabetes mellitus with diabetic cataract: Secondary | ICD-10-CM | POA: Insufficient documentation

## 2022-08-13 DIAGNOSIS — Z87891 Personal history of nicotine dependence: Secondary | ICD-10-CM | POA: Diagnosis not present

## 2022-08-13 DIAGNOSIS — Z8572 Personal history of non-Hodgkin lymphomas: Secondary | ICD-10-CM | POA: Insufficient documentation

## 2022-08-13 HISTORY — DX: Rheumatic tricuspid insufficiency: I07.1

## 2022-08-13 HISTORY — DX: Presence of dental prosthetic device (complete) (partial): Z97.2

## 2022-08-13 HISTORY — DX: Nonrheumatic mitral (valve) insufficiency: I34.0

## 2022-08-13 HISTORY — DX: Unspecified chronic bronchitis: J42

## 2022-08-13 HISTORY — DX: Morbid (severe) obesity due to excess calories: E66.01

## 2022-08-13 HISTORY — DX: Dizziness and giddiness: R42

## 2022-08-13 HISTORY — PX: CATARACT EXTRACTION W/PHACO: SHX586

## 2022-08-13 HISTORY — DX: Unspecified atherosclerosis: I70.90

## 2022-08-13 LAB — GLUCOSE, CAPILLARY: Glucose-Capillary: 138 mg/dL — ABNORMAL HIGH (ref 70–99)

## 2022-08-13 SURGERY — PHACOEMULSIFICATION, CATARACT, WITH IOL INSERTION
Anesthesia: Monitor Anesthesia Care | Site: Eye | Laterality: Left

## 2022-08-13 MED ORDER — BRIMONIDINE TARTRATE-TIMOLOL 0.2-0.5 % OP SOLN
OPHTHALMIC | Status: DC | PRN
  Administered 2022-08-13: 1 [drp] via OPHTHALMIC

## 2022-08-13 MED ORDER — PHENYLEPHRINE HCL 10 % OP SOLN
1.0000 [drp] | OPHTHALMIC | Status: DC | PRN
  Administered 2022-08-13 (×3): 1 [drp] via OPHTHALMIC

## 2022-08-13 MED ORDER — FENTANYL CITRATE (PF) 100 MCG/2ML IJ SOLN
INTRAMUSCULAR | Status: DC | PRN
  Administered 2022-08-13 (×2): 50 ug via INTRAVENOUS

## 2022-08-13 MED ORDER — SIGHTPATH DOSE#1 BSS IO SOLN
INTRAOCULAR | Status: DC | PRN
  Administered 2022-08-13: 71 mL via OPHTHALMIC

## 2022-08-13 MED ORDER — SIGHTPATH DOSE#1 BSS IO SOLN
INTRAOCULAR | Status: DC | PRN
  Administered 2022-08-13: 2 mL

## 2022-08-13 MED ORDER — CYCLOPENTOLATE HCL 2 % OP SOLN
1.0000 [drp] | OPHTHALMIC | Status: DC | PRN
  Administered 2022-08-13 (×3): 1 [drp] via OPHTHALMIC

## 2022-08-13 MED ORDER — MIDAZOLAM HCL 2 MG/2ML IJ SOLN
INTRAMUSCULAR | Status: DC | PRN
  Administered 2022-08-13 (×2): 1 mg via INTRAVENOUS

## 2022-08-13 MED ORDER — SIGHTPATH DOSE#1 NA HYALUR & NA CHOND-NA HYALUR IO KIT
PACK | INTRAOCULAR | Status: DC | PRN
  Administered 2022-08-13: 1 via OPHTHALMIC

## 2022-08-13 MED ORDER — TETRACAINE HCL 0.5 % OP SOLN
1.0000 [drp] | OPHTHALMIC | Status: DC | PRN
  Administered 2022-08-13 (×3): 1 [drp] via OPHTHALMIC

## 2022-08-13 MED ORDER — LACTATED RINGERS IV SOLN: INTRAVENOUS | Status: DC

## 2022-08-13 MED ORDER — SIGHTPATH DOSE#1 BSS IO SOLN
INTRAOCULAR | Status: DC | PRN
  Administered 2022-08-13: 15 mL via INTRAOCULAR

## 2022-08-13 MED ORDER — MOXIFLOXACIN HCL 0.5 % OP SOLN
OPHTHALMIC | Status: DC | PRN
  Administered 2022-08-13: .2 mL via OPHTHALMIC

## 2022-08-13 SURGICAL SUPPLY — 9 items
CATARACT SUITE SIGHTPATH (MISCELLANEOUS) ×1 IMPLANT
FEE CATARACT SUITE SIGHTPATH (MISCELLANEOUS) ×1 IMPLANT
GLOVE SRG 8 PF TXTR STRL LF DI (GLOVE) ×1 IMPLANT
GLOVE SURG ENC TEXT LTX SZ7.5 (GLOVE) ×1 IMPLANT
GLOVE SURG UNDER POLY LF SZ8 (GLOVE) ×1
LENS IOL TECNIS EYHANCE 24.0 (Intraocular Lens) IMPLANT
NDL FILTER BLUNT 18X1 1/2 (NEEDLE) ×1 IMPLANT
NEEDLE FILTER BLUNT 18X1 1/2 (NEEDLE) ×1 IMPLANT
SYR 3ML LL SCALE MARK (SYRINGE) ×1 IMPLANT

## 2022-08-13 NOTE — Addendum Note (Signed)
Addendum  created 08/13/22 0844 by Louie Boston, MD   Attestation recorded in Intraprocedure, Intraprocedure Attestations deleted, Intraprocedure Attestations filed

## 2022-08-13 NOTE — Transfer of Care (Signed)
Immediate Anesthesia Transfer of Care Note  Patient: Mason Houston  Procedure(s) Performed: CATARACT EXTRACTION PHACO AND INTRAOCULAR LENS PLACEMENT (IOC) LEFT MALYUGIN DIABETIC 9.15 00:45.7 (Left: Eye)  Patient Location: PACU  Anesthesia Type: MAC  Level of Consciousness: awake, alert  and patient cooperative  Airway and Oxygen Therapy: Patient Spontanous Breathing and Patient connected to supplemental oxygen  Post-op Assessment: Post-op Vital signs reviewed, Patient's Cardiovascular Status Stable, Respiratory Function Stable, Patent Airway and No signs of Nausea or vomiting  Post-op Vital Signs: Reviewed and stable  Complications: No notable events documented.

## 2022-08-13 NOTE — H&P (Signed)
[facility]   Primary Care Physician:  Gracelyn Nurse, MD Ophthalmologist: Dr. Lockie Mola  Pre-Procedure History & Physical: HPI:  Mason Houston is a 72 y.o. male here for ophthalmic surgery.   Past Medical History:  Diagnosis Date   Atherosclerosis    Atrial fibrillation (HCC)    Cancer (HCC)    Chronic bronchitis (HCC)    Collagen vascular disease (HCC)    Depression    Diabetes mellitus without complication (HCC)    Dysrhythmia    Fibromyalgia    GERD (gastroesophageal reflux disease)    H/O Bell's palsy    History of [place] measles    Hypertension    Mild mitral regurgitation by prior echocardiogram    Mild tricuspid regurgitation by prior echocardiogram    Mini stroke    Morbid obesity with BMI of 40.0-44.9, adult (HCC)    Non Hodgkin's lymphoma (HCC) 2017   Osteoarthrosis, unspecified whether generalized or localized, lower leg    Sixth cranial nerve palsy    Sleep apnea    CPAP   Stroke (HCC)    "Mini strokes". None since 2015 (went on Xarelto)   Vertigo    Wears dentures    full upper    Past Surgical History:  Procedure Laterality Date   COLONOSCOPY  2012   Dr Bluford Kaufmann   COLONOSCOPY WITH PROPOFOL N/A 09/09/2017   Procedure: COLONOSCOPY WITH PROPOFOL;  Surgeon: Toledo, Boykin Nearing, MD;  Location: [facility] ENDOSCOPY;  Service: Gastroenterology;  Laterality: N/A;   COLONOSCOPY WITH PROPOFOL N/A 09/21/2018   Procedure: COLONOSCOPY WITH PROPOFOL;  Surgeon: Toledo, Boykin Nearing, MD;  Location: [facility] ENDOSCOPY;  Service: Gastroenterology;  Laterality: N/A;   CYST EXCISION     ERCP N/A 12/29/2014   Procedure: ENDOSCOPIC RETROGRADE CHOLANGIOPANCREATOGRAPHY (ERCP);  Surgeon: Wallace Cullens, MD;  Location: [facility] ENDOSCOPY;  Service: Endoscopy;  Laterality: N/A;   ERCP N/A 02/21/2015   Procedure: ENDOSCOPIC RETROGRADE CHOLANGIOPANCREATOGRAPHY (ERCP);  Surgeon: Wallace Cullens, MD;  Location: [facility] ENDOSCOPY;  Service: Gastroenterology;  Laterality: N/A;   ERCP N/A 08/14/2015    Procedure: ENDOSCOPIC RETROGRADE CHOLANGIOPANCREATOGRAPHY (ERCP) Stent removal;  Surgeon: Midge Minium, MD;  Location: [facility] ENDOSCOPY;  Service: Endoscopy;  Laterality: N/A;   PERIPHERAL VASCULAR CATHETERIZATION N/A 01/31/2015   Procedure: Shelda Pal Cath Insertion;  Surgeon: Renford Dills, MD;  Location: [facility] INVASIVE CV LAB;  Service: Cardiovascular;  Laterality: N/A;   PERIPHERAL VASCULAR CATHETERIZATION N/A 12/18/2015   Procedure: Shelda Pal Cath Removal;  Surgeon: Renford Dills, MD;  Location: [facility] INVASIVE CV LAB;  Service: Cardiovascular;  Laterality: N/A;   TRIGGER FINGER RELEASE      Prior to Admission medications   Medication Sig Start Date End Date Taking? Authorizing Provider  ACETAMINOPHEN ER PO Take 1,000 mg by mouth daily.   Yes [provider]  Ascorbic Acid (VITAMIN C) 1000 MG tablet Take 1,000 mg by mouth daily.   Yes [provider]  b complex vitamins capsule Take 1 capsule by mouth daily.    Yes [provider]  cholecalciferol (VITAMIN D) 1000 units tablet Take 1,000 Units by mouth daily.   Yes [provider]  diltiazem (TIAZAC) 360 MG 24 hr capsule Take 1 capsule by mouth once daily 03/31/22  Yes Arida, Muhammad A, MD  glipiZIDE (GLUCOTROL) 10 MG tablet Take 10 mg by mouth daily before breakfast.   Yes [provider]  loratadine (CLARITIN) 10 MG tablet Take 10 mg by mouth daily as needed.    Yes [provider]  losartan (COZAAR) 50 MG tablet Take 2 tablets by mouth once daily 03/31/22  Yes Arida, Chelsea Aus, MD  magnesium oxide (MAG-OX) 400 (241.3 Mg) MG tablet Take 250 mg daily by mouth.    Yes [provider]  meclizine (ANTIVERT) 25 MG tablet Take 25 mg by mouth 3 (three) times daily as needed for dizziness.   Yes [provider]  metFORMIN (GLUCOPHAGE) 1000 MG tablet Take 1,000 mg by mouth 2 (two) times daily. 11/02/18  Yes [provider]  omeprazole (PRILOSEC) 20 MG capsule Take 1 capsule  by mouth once daily 12/20/20  Yes Alinda Dooms, NP  rivaroxaban (XARELTO) 20 MG TABS tablet TAKE 1 TABLET BY MOUTH ONCE DAILY WITH SUPPER 08/12/21  Yes Iran Ouch, MD  rosuvastatin (CRESTOR) 10 MG tablet TAKE 1 TABLET(10 MG) BY MOUTH DAILY 01/28/17  Yes Ellyn Hack, MD    Allergies as of 06/23/2022 - Review Complete 06/17/2022  Allergen Reaction Noted   Celebrex [celecoxib] Hives 12/23/2011   Other Itching 12/14/2015   Penicillins Hives and Other (See Comments) 12/23/2011    Family History  Problem Relation Age of Onset   Hypertension Mother    Heart attack Father    Stroke Father    Diabetes Paternal Grandmother    Diabetes Paternal Grandfather     Social History   Socioeconomic History   Marital status: Married    Spouse name: Not on file   Number of children: Not on file   Years of education: Not on file   Highest education level: Not on file  Occupational History   Not on file  Tobacco Use   Smoking status: Former    Current packs/day: 0.00    Average packs/day: 4.5 packs/day for 15.0 years (67.5 ttl pk-yrs)    Types: Cigarettes    Start date: 01/06/1966    Quit date: 01/06/1981    Years since quitting: 41.6   Smokeless tobacco: Never  Vaping Use   Vaping status: Never Used  Substance and Sexual Activity   Alcohol use: No   Drug use: No    Types: Marijuana    Comment: PAST   Sexual activity: Not on file  Other Topics Concern   Not on file  Social History Narrative   Not on file   Social Determinants of Health   Financial Resource Strain: Not on file  Food Insecurity: Not on file  Transportation Needs: Not on file  Physical Activity: Not on file  Stress: Not on file  Social Connections: Not on file  Intimate Partner Violence: Not on file    Review of Systems: See HPI, otherwise negative ROS  Physical Exam: BP (!) 158/94   Temp (!) 97.5 F (36.4 C) (Temporal)   Resp 16   Ht 5' 7.01" (1.702 m)   Wt 123.8 kg   SpO2 95%   BMI 42.75  kg/m  General:   Alert,  pleasant and cooperative in NAD Head:  Normocephalic and atraumatic. Lungs:  Clear to auscultation.    Heart:  Regular rate and rhythm.   Impression/Plan: Cage Holleman is here for ophthalmic surgery.  Risks, benefits, limitations, and alternatives regarding ophthalmic surgery have been reviewed with the patient.  Questions have been answered.  All parties agreeable.   Lockie Mola, MD  08/13/2022, 7:35 AM

## 2022-08-13 NOTE — Op Note (Signed)
OPERATIVE NOTE  Mason Houston [id number] 08/13/2022  PREOPERATIVE DIAGNOSIS:   Nuclear sclerotic cataract left eye with miotic pupil      H25.12   POSTOPERATIVE DIAGNOSIS:   Nuclear sclerotic cataract left eye with miotic pupil.     PROCEDURE:  Phacoemulsification with posterior chamber intraocular lens implantation of the left eye which required pupil stretching with the Malyugin pupil expansion device  Ultrasound time: Procedure(s): CATARACT EXTRACTION PHACO AND INTRAOCULAR LENS PLACEMENT (IOC) LEFT MALYUGIN DIABETIC 9.15 00:45.7 (Left)  LENS:   Implant Name Type Inv. Item Serial No. Manufacturer Lot No. LRB No. Used Action  LENS IOL TECNIS EYHANCE 24.0 - [id number] Intraocular Lens LENS IOL TECNIS EYHANCE 24.0 [id number] [employer]  Left 1 Implanted          SURGEON:  Deirdre Evener, MD   ANESTHESIA: Topical with tetracaine drops and 2% Xylocaine jelly, augmented with 1% preservative-free intracameral lidocaine.   COMPLICATIONS:  None.   DESCRIPTION OF PROCEDURE:  The patient was identified in the holding room and transported to the operating room and placed in the supine position under the operating microscope.  The left eye was identified as the operative eye and it was prepped and draped in the usual sterile ophthalmic fashion.   A 1 millimeter clear-corneal paracentesis was made at the 2:30 position.  The anterior chamber was filled with Viscoat viscoelastic.  0.5 ml of preservative-free 1% lidocaine was injected into the anterior chamber.  A 2.4 millimeter keratome was used to make a near-clear corneal incision at the 11:30 position.  A Malyugin pupil expander was then placed through the main incision and into the anterior chamber of the eye.  The edge of the iris was secured on the lip of the pupil expander and it was released, thereby expanding the pupil to approximately 7 millimeters for completion of the cataract surgery.  Additional Viscoat was placed in the anterior  chamber.  A cystotome and capsulorrhexis forceps were used to make a curvilinear capsulorrhexis.   Balanced salt solution was used to hydrodissect and hydrodelineate the lens nucleus.   Phacoemulsification was used in stop and chop fashion to remove the lens, nucleus and epinucleus.  The remaining cortex was aspirated using the irrigation aspiration handpiece.  Additional Provisc was placed into the eye to distend the capsular bag for lens placement.  A lens was then injected into the capsular bag.  The pupil expanding ring was removed using a Kuglen hook and insertion device. The remaining viscoelastic was aspirated from the capsular bag and the anterior chamber.  The anterior chamber was filled with balanced salt solution to inflate to a physiologic pressure.   Wounds were hydrated with balanced salt solution.  The anterior chamber was inflated to a physiologic pressure with balanced salt solution.  No wound leaks were noted. Cefuroxime 0.1 ml of a 10mg /ml solution was injected into the anterior chamber for a dose of 1 mg of intracameral antibiotic at the completion of the case.   Timolol and Brimonidine drops were applied to the eye.  The patient was taken to the recovery room in stable condition without complications of anesthesia or surgery.  , 08/13/2022, 8:28 AM

## 2022-08-13 NOTE — Anesthesia Postprocedure Evaluation (Signed)
Anesthesia Post Note  Patient: Sreyas Shiba  Procedure(s) Performed: CATARACT EXTRACTION PHACO AND INTRAOCULAR LENS PLACEMENT (IOC) LEFT MALYUGIN DIABETIC 9.15 00:45.7 (Left: Eye)  Patient location during evaluation: PACU Anesthesia Type: MAC Level of consciousness: awake and alert Pain management: pain level controlled Vital Signs Assessment: post-procedure vital signs reviewed and stable Respiratory status: spontaneous breathing, nonlabored ventilation, respiratory function stable and patient connected to nasal cannula oxygen Cardiovascular status: stable and blood pressure returned to baseline Postop Assessment: no apparent nausea or vomiting Anesthetic complications: no   No notable events documented.   Last Vitals:  Vitals:   08/13/22 0830 08/13/22 0836  BP: (!) 141/69 125/68  Pulse: 63 60  Resp: 17 16  Temp: (!) 36.2 C (!) 36.2 C  SpO2: 95% 97%    Last Pain:  Vitals:   08/13/22 0836  TempSrc: Temporal  PainSc: 0-No pain                 Louie Boston

## 2022-08-15 ENCOUNTER — Encounter: Payer: Self-pay | Admitting: Ophthalmology

## 2022-08-20 NOTE — Anesthesia Preprocedure Evaluation (Addendum)
Anesthesia Evaluation  Patient identified by MRN, date of birth, ID band Patient awake    Reviewed: Allergy & Precautions, H&P , NPO status , Patient's Chart, lab work & pertinent test results  Airway Mallampati: III  TM Distance: >3 FB Neck ROM: Full    Dental no notable dental hx. (+) Upper Dentures   Pulmonary neg pulmonary ROS, sleep apnea , pneumonia, former smoker   Pulmonary exam normal breath sounds clear to auscultation       Cardiovascular hypertension, negative cardio ROS Normal cardiovascular exam+ dysrhythmias  Rhythm:Regular Rate:Normal  08-15-20 1. Left ventricular ejection fraction, by estimation, is 55 to 60%. The  left ventricle has normal function. The left ventricle has no regional  wall motion abnormalities. Left ventricular diastolic parameters are  indeterminate.   2. Right ventricular systolic function is normal. The right ventricular  size is normal. There is normal pulmonary artery systolic pressure.   3. Left atrial size was mildly dilated.   4. The mitral valve is normal in structure. Mild mitral valve  regurgitation.   5. The aortic valve is grossly normal. Aortic valve regurgitation is not  visualized.   6. The inferior vena cava is normal in size with greater than 50%  respiratory variability, suggesting right atrial pressure of 3 mmHg.      Neuro/Psych  PSYCHIATRIC DISORDERS  Depression     Neuromuscular disease CVA negative neurological ROS  negative psych ROS   GI/Hepatic negative GI ROS, Neg liver ROS,GERD  ,,  Endo/Other  negative endocrine ROSdiabetes    Renal/GU negative Renal ROS  negative genitourinary   Musculoskeletal negative musculoskeletal ROS (+) Arthritis ,  Fibromyalgia -  Abdominal   Peds negative pediatric ROS (+)  Hematology negative hematology ROS (+)   Anesthesia Other Findings GERD (gastroesophageal reflux disease) Osteoarthrosis, unspecified whether  generalized or localized, lower leg H/O Bell's palsy  History of [place] measles Atrial fibrillation (HCC) Hypertension Mini stroke  Sixth cranial nerve palsy Repeat cataract from 08-13-22 Fibromyalgia  Collagen vascular disease (HCC) Stroke (HCC)  Sleep apnea Cancer (HCC) Non Hodgkin's lymphoma  Depression  Dysrhythmia Diabetes mellitus without complication Vertigo Wears dentures  Morbid obesity with BMI of 40.0-44.9, adult (HCC) Chronic bronchitis (HCC) Atherosclerosis Mild mitral regurgitation by prior echocardiogram  Mild tricuspid regurgitation by prior echocardiogram    Reproductive/Obstetrics negative OB ROS                              Anesthesia Physical Anesthesia Plan  ASA: 3  Anesthesia Plan: MAC   Post-op Pain Management:    Induction: Intravenous  PONV Risk Score and Plan:   Airway Management Planned: Natural Airway and Nasal Cannula  Additional Equipment:   Intra-op Plan:   Post-operative Plan:   Informed Consent: I have reviewed the patients History and Physical, chart, labs and discussed the procedure including the risks, benefits and alternatives for the proposed anesthesia with the patient or authorized representative who has indicated his/her understanding and acceptance.     Dental Advisory Given  Plan Discussed with: Anesthesiologist, CRNA and Surgeon  Anesthesia Plan Comments: (Patient consented for risks of anesthesia including but not limited to:  - adverse reactions to medications - damage to eyes, teeth, lips or other oral mucosa - nerve damage due to positioning  - sore throat or hoarseness - Damage to heart, brain, nerves, lungs, other parts of body or loss of life  Patient voiced understanding.)  Anesthesia Quick Evaluation

## 2022-08-21 ENCOUNTER — Encounter: Payer: Self-pay | Admitting: Ophthalmology

## 2022-08-26 NOTE — Discharge Instructions (Signed)

## 2022-08-27 ENCOUNTER — Other Ambulatory Visit: Payer: Self-pay

## 2022-08-27 ENCOUNTER — Encounter
Admission: RE | Disposition: A | Discharge status: Home / Self Care | Payer: Self-pay | Source: Home / Self Care | Attending: Ophthalmology

## 2022-08-27 ENCOUNTER — Ambulatory Visit: Payer: Medicare HMO | Admitting: Anesthesiology

## 2022-08-27 ENCOUNTER — Ambulatory Visit
Admission: RE | Admit: 2022-08-27 | Discharge: 2022-08-27 | Disposition: A | Discharge status: Home / Self Care | Payer: Medicare HMO | Attending: Ophthalmology | Admitting: Ophthalmology

## 2022-08-27 DIAGNOSIS — Z87891 Personal history of nicotine dependence: Secondary | ICD-10-CM | POA: Insufficient documentation

## 2022-08-27 DIAGNOSIS — H5703 Miosis: Secondary | ICD-10-CM | POA: Diagnosis not present

## 2022-08-27 DIAGNOSIS — E1136 Type 2 diabetes mellitus with diabetic cataract: Secondary | ICD-10-CM | POA: Insufficient documentation

## 2022-08-27 DIAGNOSIS — M1990 Unspecified osteoarthritis, unspecified site: Secondary | ICD-10-CM | POA: Diagnosis not present

## 2022-08-27 DIAGNOSIS — K219 Gastro-esophageal reflux disease without esophagitis: Secondary | ICD-10-CM | POA: Diagnosis not present

## 2022-08-27 DIAGNOSIS — Z8572 Personal history of non-Hodgkin lymphomas: Secondary | ICD-10-CM | POA: Insufficient documentation

## 2022-08-27 DIAGNOSIS — Z6841 Body Mass Index (BMI) 40.0 and over, adult: Secondary | ICD-10-CM | POA: Insufficient documentation

## 2022-08-27 DIAGNOSIS — I10 Essential (primary) hypertension: Secondary | ICD-10-CM | POA: Diagnosis not present

## 2022-08-27 DIAGNOSIS — Z8673 Personal history of transient ischemic attack (TIA), and cerebral infarction without residual deficits: Secondary | ICD-10-CM | POA: Insufficient documentation

## 2022-08-27 DIAGNOSIS — I4891 Unspecified atrial fibrillation: Secondary | ICD-10-CM | POA: Diagnosis not present

## 2022-08-27 DIAGNOSIS — M797 Fibromyalgia: Secondary | ICD-10-CM | POA: Diagnosis not present

## 2022-08-27 DIAGNOSIS — H2511 Age-related nuclear cataract, right eye: Secondary | ICD-10-CM | POA: Diagnosis not present

## 2022-08-27 DIAGNOSIS — G4730 Sleep apnea, unspecified: Secondary | ICD-10-CM | POA: Insufficient documentation

## 2022-08-27 HISTORY — PX: CATARACT EXTRACTION W/PHACO: SHX586

## 2022-08-27 LAB — GLUCOSE, CAPILLARY: Glucose-Capillary: 128 mg/dL — ABNORMAL HIGH (ref 70–99)

## 2022-08-27 SURGERY — PHACOEMULSIFICATION, CATARACT, WITH IOL INSERTION
Anesthesia: Monitor Anesthesia Care | Laterality: Right

## 2022-08-27 MED ORDER — FENTANYL CITRATE (PF) 100 MCG/2ML IJ SOLN
INTRAMUSCULAR | Status: DC | PRN
  Administered 2022-08-27: 100 ug via INTRAVENOUS

## 2022-08-27 MED ORDER — SIGHTPATH DOSE#1 BSS IO SOLN
INTRAOCULAR | Status: DC | PRN
  Administered 2022-08-27: 2 mL

## 2022-08-27 MED ORDER — CYCLOPENTOLATE HCL 2 % OP SOLN
1.0000 [drp] | OPHTHALMIC | Status: AC
  Administered 2022-08-27 (×3): 1 [drp] via OPHTHALMIC

## 2022-08-27 MED ORDER — SIGHTPATH DOSE#1 BSS IO SOLN
INTRAOCULAR | Status: DC | PRN
  Administered 2022-08-27: 60 mL via OPHTHALMIC

## 2022-08-27 MED ORDER — PHENYLEPHRINE HCL 10 % OP SOLN: 1.0000 [drp] | OPHTHALMIC | Status: DC

## 2022-08-27 MED ORDER — CYCLOPENTOLATE HCL 2 % OP SOLN: 1.0000 [drp] | OPHTHALMIC | Status: DC

## 2022-08-27 MED ORDER — MOXIFLOXACIN HCL 0.5 % OP SOLN
OPHTHALMIC | Status: DC | PRN
  Administered 2022-08-27: .2 mL via OPHTHALMIC

## 2022-08-27 MED ORDER — TETRACAINE HCL 0.5 % OP SOLN: 1.0000 [drp] | OPHTHALMIC | Status: DC | PRN

## 2022-08-27 MED ORDER — LACTATED RINGERS IV SOLN: INTRAVENOUS | Status: DC

## 2022-08-27 MED ORDER — SIGHTPATH DOSE#1 BSS IO SOLN
INTRAOCULAR | Status: DC | PRN
  Administered 2022-08-27: 15 mL via INTRAOCULAR

## 2022-08-27 MED ORDER — PHENYLEPHRINE HCL 10 % OP SOLN
1.0000 [drp] | OPHTHALMIC | Status: AC
  Administered 2022-08-27 (×3): 1 [drp] via OPHTHALMIC

## 2022-08-27 MED ORDER — MIDAZOLAM HCL 2 MG/2ML IJ SOLN
INTRAMUSCULAR | Status: DC | PRN
  Administered 2022-08-27: 2 mg via INTRAVENOUS

## 2022-08-27 MED ORDER — TETRACAINE HCL 0.5 % OP SOLN
1.0000 [drp] | OPHTHALMIC | Status: AC | PRN
  Administered 2022-08-27 (×3): 1 [drp] via OPHTHALMIC

## 2022-08-27 MED ORDER — BRIMONIDINE TARTRATE-TIMOLOL 0.2-0.5 % OP SOLN
OPHTHALMIC | Status: DC | PRN
  Administered 2022-08-27: 1 [drp] via OPHTHALMIC

## 2022-08-27 MED ORDER — SIGHTPATH DOSE#1 NA HYALUR & NA CHOND-NA HYALUR IO KIT
PACK | INTRAOCULAR | Status: DC | PRN
  Administered 2022-08-27: 1 via OPHTHALMIC

## 2022-08-27 SURGICAL SUPPLY — 9 items
CATARACT SUITE SIGHTPATH (MISCELLANEOUS) ×1
FEE CATARACT SUITE SIGHTPATH (MISCELLANEOUS) ×1 IMPLANT
GLOVE SRG 8 PF TXTR STRL LF DI (GLOVE) ×1 IMPLANT
GLOVE SURG ENC TEXT LTX SZ7.5 (GLOVE) ×1 IMPLANT
GLOVE SURG UNDER POLY LF SZ8 (GLOVE) ×1
LENS IOL TECNIS EYHANCE 25.0 (Intraocular Lens) IMPLANT
NDL FILTER BLUNT 18X1 1/2 (NEEDLE) ×1 IMPLANT
NEEDLE FILTER BLUNT 18X1 1/2 (NEEDLE) ×1
SYR 3ML LL SCALE MARK (SYRINGE) ×1 IMPLANT

## 2022-08-27 NOTE — Op Note (Signed)
OPERATIVE NOTE  Mason Houston [id number] 08/27/2022   PREOPERATIVE DIAGNOSIS:    Nuclear Sclerotic Cataract Right eye with miotic pupil.        H25.11  POSTOPERATIVE DIAGNOSIS: Nuclear Sclerotic Cataract Right eye with miotic pupil.          PROCEDURE:  Phacoemusification with posterior chamber intraocular lens placement of the right eye which required pupil stretching with the Malyugin pupil expansion device. Ultrasound time: Procedure(s): CATARACT EXTRACTION PHACO AND INTRAOCULAR LENS PLACEMENT (IOC) RIGHT MALYUGIN DIABETIC 8.11 00:44.0 (Right)  LENS:   Implant Name Type Inv. Item Serial No. Manufacturer Lot No. LRB No. Used Action  LENS IOL TECNIS EYHANCE 25.0 - [id number] Intraocular Lens LENS IOL TECNIS EYHANCE 25.0 [id number] [employer]  Right 1 Implanted      SURGEON:  Deirdre Evener, MD   ANESTHESIA:  Topical with tetracaine drops and 2% Xylocaine jelly, augmented with 1% preservative-free intracameral lidocaine.   COMPLICATIONS:  None.   DESCRIPTION OF PROCEDURE:  The patient was identified in the holding room and transported to the operating room and placed in the supine position under the operating microscope. The right eye was identified as the operative eye and it was prepped and draped in the usual sterile ophthalmic fashion.   A 1 millimeter clear-corneal paracentesis was made at the 12:00 position.  0.5 ml of preservative-free 1% lidocaine was injected into the anterior chamber. The anterior chamber was filled with Viscoat viscoelastic.  A 2.4 millimeter keratome was used to make a near-clear corneal incision at the 9:00 position. A Malyugin pupil expander was then placed through the main incision and into the anterior chamber of the eye.  The edge of the iris was secured on the lip of the pupil expander and it was released, thereby expanding the pupil to approximately 7 millimeters for completion of the cataract surgery.  Additional Viscoat was placed in the  anterior chamber.  A cystotome and capsulorrhexis forceps were used to make a curvilinear capsulorrhexis.   Balanced salt solution was used to hydrodissect and hydrodelineate the lens nucleus.   Phacoemulsification was used in stop and chop fashion to remove the lens, nucleus and epinucleus.  The remaining cortex was aspirated using the irrigation aspiration handpiece.  Additional Provisc was placed into the eye to distend the capsular bag for lens placement.  A lens was then injected into the capsular bag.  The pupil expanding ring was removed using a Kuglen hook and insertion device. The remaining viscoelastic was aspirated from the capsular bag and the anterior chamber.  The anterior chamber was filled with balanced salt solution to inflate to a physiologic pressure.  Wounds were hydrated with balanced salt solution.  The anterior chamber was inflated to a physiologic pressure with balanced salt solution.  No wound leaks were noted.Vigamox 0.2 ml of a 1mg  per ml solution was injected into the anterior chamber for a dose of 0.2 mg of intracameral antibiotic at the completion of the case. Timolol and Brimonidine drops were applied to the eye.  The patient was taken to the recovery room in stable condition without complications of anesthesia or surgery.  [name] 08/27/2022, 7:58 AM

## 2022-08-27 NOTE — Transfer of Care (Signed)
Immediate Anesthesia Transfer of Care Note  Patient: Mason Houston  Procedure(s) Performed: CATARACT EXTRACTION PHACO AND INTRAOCULAR LENS PLACEMENT (IOC) RIGHT MALYUGIN DIABETIC 8.11 00:44.0 (Right)  Patient Location: PACU  Anesthesia Type: MAC  Level of Consciousness: awake, alert  and patient cooperative  Airway and Oxygen Therapy: Patient Spontanous Breathing and Patient connected to supplemental oxygen  Post-op Assessment: Post-op Vital signs reviewed, Patient's Cardiovascular Status Stable, Respiratory Function Stable, Patent Airway and No signs of Nausea or vomiting  Post-op Vital Signs: Reviewed and stable  Complications: No notable events documented.

## 2022-08-27 NOTE — H&P (Signed)
[facility]   Primary Care Physician:  Gracelyn Nurse, MD Ophthalmologist: Dr. Lockie Mola  Pre-Procedure History & Physical: HPI:  Mason Houston is a 72 y.o. male here for ophthalmic surgery.   Past Medical History:  Diagnosis Date   Atherosclerosis    Atrial fibrillation (HCC)    Cancer (HCC)    Chronic bronchitis (HCC)    Collagen vascular disease (HCC)    Depression    Diabetes mellitus without complication (HCC)    Dysrhythmia    Fibromyalgia    GERD (gastroesophageal reflux disease)    H/O Bell's palsy    History of [place] measles    Hypertension    Mild mitral regurgitation by prior echocardiogram    Mild tricuspid regurgitation by prior echocardiogram    Mini stroke    Morbid obesity with BMI of 40.0-44.9, adult (HCC)    Non Hodgkin's lymphoma (HCC) 2017   Osteoarthrosis, unspecified whether generalized or localized, lower leg    Sixth cranial nerve palsy    Sleep apnea    CPAP   Stroke (HCC)    "Mini strokes". None since 2015 (went on Xarelto)   Vertigo    Wears dentures    full upper    Past Surgical History:  Procedure Laterality Date   CATARACT EXTRACTION W/PHACO Left 08/13/2022   Procedure: CATARACT EXTRACTION PHACO AND INTRAOCULAR LENS PLACEMENT (IOC) LEFT MALYUGIN DIABETIC 9.15 00:45.7;  Surgeon: Lockie Mola, MD;  Location: [facility];  Service: Ophthalmology;  Laterality: Left;   COLONOSCOPY  2012   Dr Bluford Kaufmann   COLONOSCOPY WITH PROPOFOL N/A 09/09/2017   Procedure: COLONOSCOPY WITH PROPOFOL;  Surgeon: Toledo, Boykin Nearing, MD;  Location: [facility] ENDOSCOPY;  Service: Gastroenterology;  Laterality: N/A;   COLONOSCOPY WITH PROPOFOL N/A 09/21/2018   Procedure: COLONOSCOPY WITH PROPOFOL;  Surgeon: Toledo, Boykin Nearing, MD;  Location: [facility] ENDOSCOPY;  Service: Gastroenterology;  Laterality: N/A;   CYST EXCISION     ERCP N/A 12/29/2014   Procedure: ENDOSCOPIC RETROGRADE CHOLANGIOPANCREATOGRAPHY (ERCP);  Surgeon: Wallace Cullens, MD;  Location:  [facility] ENDOSCOPY;  Service: Endoscopy;  Laterality: N/A;   ERCP N/A 02/21/2015   Procedure: ENDOSCOPIC RETROGRADE CHOLANGIOPANCREATOGRAPHY (ERCP);  Surgeon: Wallace Cullens, MD;  Location: [facility] ENDOSCOPY;  Service: Gastroenterology;  Laterality: N/A;   ERCP N/A 08/14/2015   Procedure: ENDOSCOPIC RETROGRADE CHOLANGIOPANCREATOGRAPHY (ERCP) Stent removal;  Surgeon: Midge Minium, MD;  Location: [facility] ENDOSCOPY;  Service: Endoscopy;  Laterality: N/A;   PERIPHERAL VASCULAR CATHETERIZATION N/A 01/31/2015   Procedure: Shelda Pal Cath Insertion;  Surgeon: Renford Dills, MD;  Location: [facility] INVASIVE CV LAB;  Service: Cardiovascular;  Laterality: N/A;   PERIPHERAL VASCULAR CATHETERIZATION N/A 12/18/2015   Procedure: Shelda Pal Cath Removal;  Surgeon: Renford Dills, MD;  Location: [facility] INVASIVE CV LAB;  Service: Cardiovascular;  Laterality: N/A;   TRIGGER FINGER RELEASE      Prior to Admission medications   Medication Sig Start Date End Date Taking? Authorizing Provider  ACETAMINOPHEN ER PO Take 1,000 mg by mouth daily.   Yes [provider]  Ascorbic Acid (VITAMIN C) 1000 MG tablet Take 1,000 mg by mouth daily.   Yes [provider]  b complex vitamins capsule Take 1 capsule by mouth daily.    Yes [provider]  cholecalciferol (VITAMIN D) 1000 units tablet Take 1,000 Units by mouth daily.   Yes [provider]  diltiazem (TIAZAC) 360 MG 24 hr capsule Take 1 capsule by mouth once daily 03/31/22  Yes Iran Ouch, MD  glipiZIDE (GLUCOTROL) 10 MG tablet Take 10 mg by mouth daily before breakfast.   Yes [provider]  loratadine (CLARITIN) 10 MG tablet Take 10 mg by mouth daily as needed.    Yes [provider]  losartan (COZAAR) 50 MG tablet Take 2 tablets by mouth once daily 03/31/22  Yes Arida, Chelsea Aus, MD  magnesium oxide (MAG-OX) 400 (241.3 Mg) MG tablet Take 250 mg daily by mouth.    Yes [provider]  meclizine (ANTIVERT) 25 MG tablet Take 25  mg by mouth 3 (three) times daily as needed for dizziness.   Yes [provider]  metFORMIN (GLUCOPHAGE) 1000 MG tablet Take 1,000 mg by mouth 2 (two) times daily. 11/02/18  Yes [provider]  omeprazole (PRILOSEC) 20 MG capsule Take 1 capsule by mouth once daily 12/20/20  Yes Alinda Dooms, NP  rivaroxaban (XARELTO) 20 MG TABS tablet TAKE 1 TABLET BY MOUTH ONCE DAILY WITH SUPPER 08/12/21  Yes Iran Ouch, MD  rosuvastatin (CRESTOR) 10 MG tablet TAKE 1 TABLET(10 MG) BY MOUTH DAILY 01/28/17  Yes Ellyn Hack, MD    Allergies as of 06/23/2022 - Review Complete 06/17/2022  Allergen Reaction Noted   Celebrex [celecoxib] Hives 12/23/2011   Other Itching 12/14/2015   Penicillins Hives and Other (See Comments) 12/23/2011    Family History  Problem Relation Age of Onset   Hypertension Mother    Heart attack Father    Stroke Father    Diabetes Paternal Grandmother    Diabetes Paternal Grandfather     Social History   Socioeconomic History   Marital status: Married    Spouse name: Not on file   Number of children: Not on file   Years of education: Not on file   Highest education level: Not on file  Occupational History   Not on file  Tobacco Use   Smoking status: Former    Current packs/day: 0.00    Average packs/day: 4.5 packs/day for 15.0 years (67.5 ttl pk-yrs)    Types: Cigarettes    Start date: 01/06/1966    Quit date: 01/06/1981    Years since quitting: 41.6   Smokeless tobacco: Never  Vaping Use   Vaping status: Never Used  Substance and Sexual Activity   Alcohol use: No   Drug use: No    Types: Marijuana    Comment: PAST   Sexual activity: Not on file  Other Topics Concern   Not on file  Social History Narrative   Not on file   Social Determinants of Health   Financial Resource Strain: Not on file  Food Insecurity: Not on file  Transportation Needs: Not on file  Physical Activity: Not on file  Stress: Not on file  Social  Connections: Not on file  Intimate Partner Violence: Not on file    Review of Systems: See HPI, otherwise negative ROS  Physical Exam: BP (!) 145/95   Pulse 74   Resp 14   Ht 5\' 7"  (1.702 m)   Wt 124.3 kg   SpO2 96%   BMI 42.91 kg/m  General:   Alert,  pleasant and cooperative in NAD Head:  Normocephalic and atraumatic. Lungs:  Clear to auscultation.    Heart:  Regular rate and rhythm.   Impression/Plan: Zadarius Marn is here for ophthalmic surgery.  Risks, benefits, limitations, and alternatives regarding ophthalmic surgery have been reviewed with the patient.  Questions have been answered.  All parties agreeable.   Lockie Mola,  MD  08/27/2022, 7:33 AM

## 2022-08-27 NOTE — Anesthesia Postprocedure Evaluation (Signed)
Anesthesia Post Note  Patient: Albi Moyo  Procedure(s) Performed: CATARACT EXTRACTION PHACO AND INTRAOCULAR LENS PLACEMENT (IOC) RIGHT MALYUGIN DIABETIC 8.11 00:44.0 (Right)  Patient location during evaluation: PACU Anesthesia Type: MAC Level of consciousness: awake and alert Pain management: pain level controlled Vital Signs Assessment: post-procedure vital signs reviewed and stable Respiratory status: spontaneous breathing, nonlabored ventilation, respiratory function stable and patient connected to nasal cannula oxygen Cardiovascular status: stable and blood pressure returned to baseline Postop Assessment: no apparent nausea or vomiting Anesthetic complications: no   No notable events documented.   Last Vitals:  Vitals:   08/27/22 0800 08/27/22 0805  BP: 132/73 123/60  Pulse: 65   Resp: 18   Temp: (!) 36.4 C 36.4 C  SpO2: 95% 95%    Last Pain:  Vitals:   08/27/22 0805  PainSc: 0-No pain                 Breena Bevacqua C Zamari Vea

## 2022-08-28 ENCOUNTER — Encounter: Payer: Self-pay | Admitting: Ophthalmology

## 2022-09-18 ENCOUNTER — Encounter: Payer: Self-pay | Admitting: Ophthalmology

## 2022-09-23 ENCOUNTER — Other Ambulatory Visit: Payer: Self-pay | Admitting: Cardiovascular Disease

## 2022-09-27 NOTE — Progress Notes (Unsigned)
Cardiology Office Note    Date:  09/29/2022  ID:  Mason Houston, DOB 1950-04-06, MRN [id number] PCP:  Gracelyn Nurse, MD  Cardiologist:  Lorine Bears, MD  Electrophysiologist:  None   Chief Complaint: Follow up for atrial fibrillation.   History of Present Illness: .    Mason Houston is a 72 y.o. male with visit-pertinent history of atrial fibrillation, CAD on CPAP, hypertension, stroke in 08/2013, previous tobacco use and non-Hodgkin's lymphoma in 2017 treated with chemotherapy.  First noted to have atrial fibrillation with rapid ventricular response during routine physical in December 2013 in the setting of heavy caffeine intake. Last echocardiogram was on 08/15/2020 which indicated an LVEF of 60%, LV with normal function, no RWMA diastolic parameters were indeterminate, there were no significant valvular abnormalities.  He was last in office in 03/2022 seen by Dr. Kirke Corin.  He was doing well from a cardiac standpoint at that time, he did note some lower extremity edema that Dr. Kirke Corin attributed to diltiazem use.  He presents for follow-up. He reports that he is doing well, he had cataracts done in August. He denies chest pain, shortness of breath or palpitations. He walks a few miles around his property once a week, tolerates well, regularly works on his property doing yard work. He notes he had a fall a few weeks ago, he tripped over something in his yard, denies hitting his head. Denied presyncope or syncope prior to or following fall. Notes he only jammed his finger, denied bleeding problems.   Labwork independently reviewed: 01/28/22: total cholesterol 107, triglyceride 76, HDL 36.4, LDL 55  06/17/22: HGB 136, HCT 41.7, sodium 137, potassium 4.6, creatinine 1.17, AST 25, ALT 19  09/26/22: Sodium 141, potassium 4.8, creatinine 1.1, hemoglobin A1C 8.0  ROS: .   Today he denies chest pain, shortness of breath, lower extremity edema, fatigue, palpitations, melena, hematuria,  hemoptysis, diaphoresis, weakness, presyncope, syncope, orthopnea, and PND. All other systems are reviewed and otherwise negative.  Studies Reviewed: Marland Kitchen    EKG:  EKG is ordered today, personally reviewed, demonstrating  EKG Interpretation Date/Time:  Monday September 29 2022 10:28:16 EDT Ventricular Rate:  84 PR Interval:    QRS Duration:  130 QT Interval:  406 QTC Calculation: 479 R Axis:   -57  Text Interpretation: Atrial fibrillation with premature ventricular or aberrantly conducted complexes Right bundle branch block Left anterior fascicular block Confirmed by Reather Littler [phone]) on 09/29/2022 10:41:34 AM    CV Studies: Cardiac studies reviewed are outlined and summarized above. Otherwise please see EMR for full report. Cardiac Studies & Procedures       ECHOCARDIOGRAM  ECHOCARDIOGRAM COMPLETE 08/15/2020  Narrative ECHOCARDIOGRAM REPORT    Patient Name:   Mason Houston Date of Exam: 08/15/2020 Medical Rec #:  [id number]       Height:       67.0 in Accession #:    [id number]      Weight:       284.0 lb Date of Birth:  03/05/1950      BSA:          2.347 m Patient Age:    69 years        BP:           130/72 mmHg Patient Gender: M               HR:           80 bpm. Exam Location:  [place]  Procedure:  2D Echo, Cardiac Doppler, Color Doppler and Intracardiac Opacification Agent  MODIFIED REPORT: This report was modified by Debbe Odea MD on 08/16/2020 due to indeterminate Diastolic function. Indications:     R06.02 SOB  History:         Patient has prior history of Echocardiogram examinations, most recent 07/11/2015. Stroke, Arrythmias:Atrial Fibrillation, Signs/Symptoms:Shortness of Breath; Risk Factors:Sleep Apnea, Hypertension, Diabetes, Dyslipidemia, Former Smoker and s/p Covid.  Sonographer:     Quentin Ore RDMS, RVT, RDCS Referring Phys:  [id number] Lennon Alstrom Diagnosing Phys: Debbe Odea MD  IMPRESSIONS   1. Left ventricular ejection  fraction, by estimation, is 55 to 60%. The left ventricle has normal function. The left ventricle has no regional wall motion abnormalities. Left ventricular diastolic parameters are indeterminate. 2. Right ventricular systolic function is normal. The right ventricular size is normal. There is normal pulmonary artery systolic pressure. 3. Left atrial size was mildly dilated. 4. The mitral valve is normal in structure. Mild mitral valve regurgitation. 5. The aortic valve is grossly normal. Aortic valve regurgitation is not visualized. 6. The inferior vena cava is normal in size with greater than 50% respiratory variability, suggesting right atrial pressure of 3 mmHg.  FINDINGS Left Ventricle: Left ventricular ejection fraction, by estimation, is 55 to 60%. The left ventricle has normal function. The left ventricle has no regional wall motion abnormalities. Definity contrast agent was given IV to delineate the left ventricular endocardial borders. The left ventricular internal cavity size was normal in size. There is no left ventricular hypertrophy. Left ventricular diastolic parameters are indeterminate.  Right Ventricle: The right ventricular size is normal. No increase in right ventricular wall thickness. Right ventricular systolic function is normal. There is normal pulmonary artery systolic pressure. The tricuspid regurgitant velocity is 2.74 m/s, and with an assumed right atrial pressure of 3 mmHg, the estimated right ventricular systolic pressure is 33.0 mmHg.  Left Atrium: Left atrial size was mildly dilated.  Right Atrium: Right atrial size was normal in size.  Pericardium: There is no evidence of pericardial effusion.  Mitral Valve: The mitral valve is normal in structure. Mild mitral valve regurgitation.  Tricuspid Valve: The tricuspid valve is normal in structure. Tricuspid valve regurgitation is mild.  Aortic Valve: The aortic valve is grossly normal. Aortic valve regurgitation is  not visualized. Aortic valve mean gradient measures 3.7 mmHg. Aortic valve peak gradient measures 6.6 mmHg. Aortic valve area, by VTI measures 2.85 cm.  Pulmonic Valve: The pulmonic valve was normal in structure. Pulmonic valve regurgitation is trivial.  Aorta: The aortic root is normal in size and structure.  Venous: The inferior vena cava is normal in size with greater than 50% respiratory variability, suggesting right atrial pressure of 3 mmHg.  IAS/Shunts: No atrial level shunt detected by color flow Doppler.   LEFT VENTRICLE PLAX 2D LVIDd:         5.20 cm  Diastology LVIDs:         3.40 cm  LV e' medial:  8.92 cm/s LV PW:         0.90 cm  LV e' lateral: 10.30 cm/s LV IVS:        1.00 cm LVOT diam:     2.20 cm LV SV:         69 LV SV Index:   29 LVOT Area:     3.80 cm   RIGHT VENTRICLE             IVC RV S prime:  12.60 cm/s  IVC diam: 1.70 cm TAPSE (M-mode): 2.5 cm  LEFT ATRIUM             Index LA diam:        4.40 cm 1.87 cm/m LA Vol (A2C):   71.9 ml 30.63 ml/m LA Vol (A4C):   91.2 ml 38.85 ml/m LA Biplane Vol: 81.7 ml 34.81 ml/m AORTIC VALVE                   PULMONIC VALVE AV Area (Vmax):    3.06 cm    PV Vmax:       0.67 m/s AV Area (Vmean):   2.77 cm    PV Peak grad:  1.8 mmHg AV Area (VTI):     2.85 cm AV Vmax:           128.00 cm/s AV Vmean:          89.433 cm/s AV VTI:            0.241 m AV Peak Grad:      6.6 mmHg AV Mean Grad:      3.7 mmHg LVOT Vmax:         102.95 cm/s LVOT Vmean:        65.150 cm/s LVOT VTI:          0.181 m LVOT/AV VTI ratio: 0.75  AORTA Ao Root diam: 2.80 cm Ao Asc diam:  3.20 cm Ao Arch diam: 2.9 cm  TRICUSPID VALVE TR Peak grad:   30.0 mmHg TR Vmax:        274.00 cm/s  SHUNTS Systemic VTI:  0.18 m Systemic Diam: 2.20 cm  Debbe Odea MD Electronically signed by Debbe Odea MD Signature Date/Time: 08/16/2020/2:52:48 PM    Final (Updated)             Current Reported Medications:.     Current Meds  Medication Sig   ACETAMINOPHEN ER PO Take 1,000 mg by mouth daily.   Ascorbic Acid (VITAMIN C) 1000 MG tablet Take 1,000 mg by mouth daily.   b complex vitamins capsule Take 1 capsule by mouth daily.    cholecalciferol (VITAMIN D) 1000 units tablet Take 1,000 Units by mouth daily.   diltiazem (TIAZAC) 360 MG 24 hr capsule Take 1 capsule by mouth once daily   glipiZIDE (GLUCOTROL) 10 MG tablet Take 10 mg by mouth daily before breakfast.   loratadine (CLARITIN) 10 MG tablet Take 10 mg by mouth daily as needed.    losartan (COZAAR) 50 MG tablet Take 2 tablets by mouth once daily   magnesium oxide (MAG-OX) 400 (241.3 Mg) MG tablet Take 250 mg daily by mouth.    metFORMIN (GLUCOPHAGE) 1000 MG tablet Take 1,000 mg by mouth 2 (two) times daily.   omeprazole (PRILOSEC) 20 MG capsule Take 1 capsule by mouth once daily   rivaroxaban (XARELTO) 20 MG TABS tablet TAKE 1 TABLET BY MOUTH ONCE DAILY WITH SUPPER   rosuvastatin (CRESTOR) 10 MG tablet TAKE 1 TABLET(10 MG) BY MOUTH DAILY   Physical Exam:    VS:  BP 120/70 (BP Location: Left Arm, Patient Position: Sitting, Cuff Size: Large)   Pulse 84   Ht 5\' 7"  (1.702 m)   Wt 272 lb 3.2 oz (123.5 kg)   SpO2 98%   BMI 42.63 kg/m    Wt Readings from Last 3 Encounters:  09/29/22 272 lb 3.2 oz (123.5 kg)  08/27/22 274 lb (124.3 kg)  08/13/22 273 lb (123.8 kg)  GEN: Well nourished, well developed in no acute distress NECK: No JVD; No carotid bruits CARDIAC: Irregular RR, no murmurs, rubs, gallops RESPIRATORY:  Clear to auscultation without rales, wheezing or rhonchi  ABDOMEN: Soft, non-tender, non-distended EXTREMITIES:  No edema; No acute deformity   Asessement and Plan:.    Chronic atrial fibrillation: EKG today shows atrial fibrillation with occasional PVCs and RBBB (chronic) at 84 bpm. Ventricular rate controlled with diltiazem. Today he denies chest pain, shortness of breath or palpitations. CHA2DS2-VASc Score = 5 [CHF History:  0, HTN History: 1, Diabetes History: 1, Stroke History: 2, Vascular Disease History: 0, Age Score: 1, Gender Score: 0].  Therefore, the patient's annual risk of stroke is 7.2 %. Reviewed ED precautions. Continue diltiazem 360 mg daily and Xarelto 20 mg daily.  Last creatinine 1.1 on 09/26/22. Creatinine clearance 109 mL/min.   HTN: Blood pressure well controlled today at 120/70. He will monitor his blood pressure at home and notify the office if his blood pressure is consistently low. Continue diltiazem 360 mg daily and losartan 100 mg daily.  Hyperlipidemia: Last lipid profile in 01/2022 indicated total cholesterol 107 triglycerides 76, HDL 36.4 and LDL 55. Continue Rosuvastatin 10 mg daily.   OSA: Reports nightly CPAP compliance.  Diabetes mellitus type 2: Last hemoglobin A1C 8.0 on 09/26/22. Monitored and managed per PCP.   Lower extremity edema: Noted to have mild lower extremity edema at last office visit in 03/2022.  Dr. Kirke Corin attributed to diltiazem use, had no other indications of heart failure or fluid overload. Today he denies any further edema, appears euvolemic and well compensated on exam.     Disposition: F/u with Dr. Kirke Corin in 6 months.   Signed, Rip Harbour, NP

## 2022-09-29 ENCOUNTER — Ambulatory Visit: Payer: Medicare HMO | Attending: Physician Assistant | Admitting: Cardiology

## 2022-09-29 ENCOUNTER — Encounter: Payer: Self-pay | Admitting: Physician Assistant

## 2022-09-29 VITALS — BP 120/70 | HR 84 | Ht 67.0 in | Wt 272.2 lb

## 2022-09-29 DIAGNOSIS — G4730 Sleep apnea, unspecified: Secondary | ICD-10-CM | POA: Diagnosis not present

## 2022-09-29 DIAGNOSIS — I10 Essential (primary) hypertension: Secondary | ICD-10-CM | POA: Diagnosis not present

## 2022-09-29 DIAGNOSIS — E785 Hyperlipidemia, unspecified: Secondary | ICD-10-CM

## 2022-09-29 DIAGNOSIS — E1169 Type 2 diabetes mellitus with other specified complication: Secondary | ICD-10-CM

## 2022-09-29 DIAGNOSIS — I482 Chronic atrial fibrillation, unspecified: Secondary | ICD-10-CM | POA: Diagnosis not present

## 2022-09-29 NOTE — Patient Instructions (Signed)
Medication Instructions:  Your Physician recommend you continue on your current medication as directed.    *If you need a refill on your cardiac medications before your next appointment, please call your pharmacy*   Lab Work: None If you have labs (blood work) drawn today and your tests are completely normal, you will receive your results only by: MyChart Message (if you have MyChart) OR A paper copy in the mail If you have any lab test that is abnormal or we need to change your treatment, we will call you to review the results.   Follow-Up: At [facility], you and your health needs are our priority.  As part of our continuing mission to provide you with exceptional heart care, we have created designated Provider Care Teams.  These Care Teams include your primary Cardiologist (physician) and Advanced Practice Providers (APPs -  Physician Assistants and Nurse Practitioners) who all work together to provide you with the care you need, when you need it.  We recommend signing up for the patient portal called "MyChart".  Sign up information is provided on this After Visit Summary.  MyChart is used to connect with patients for Virtual Visits (Telemedicine).  Patients are able to view lab/test results, encounter notes, upcoming appointments, etc.  Non-urgent messages can be sent to your provider as well.   To learn more about what you can do with MyChart, go to [email or web address].    Your next appointment:   6 month(s)  Provider:   You may see Lorine Bears, MD or one of the following Advanced Practice Providers on your designated Care Team:

## 2022-10-16 ENCOUNTER — Encounter: Payer: Self-pay | Admitting: Emergency Medicine

## 2022-10-16 ENCOUNTER — Emergency Department
Admission: EM | Admit: 2022-10-16 | Discharge: 2022-10-16 | Disposition: A | Discharge status: Home / Self Care | Payer: Medicare HMO | Attending: Emergency Medicine | Admitting: Emergency Medicine

## 2022-10-16 ENCOUNTER — Other Ambulatory Visit: Payer: Self-pay

## 2022-10-16 ENCOUNTER — Emergency Department: Payer: Medicare HMO

## 2022-10-16 DIAGNOSIS — M7918 Myalgia, other site: Secondary | ICD-10-CM

## 2022-10-16 DIAGNOSIS — M546 Pain in thoracic spine: Secondary | ICD-10-CM | POA: Insufficient documentation

## 2022-10-16 DIAGNOSIS — R0789 Other chest pain: Secondary | ICD-10-CM | POA: Diagnosis not present

## 2022-10-16 DIAGNOSIS — Y92410 Unspecified street and highway as the place of occurrence of the external cause: Secondary | ICD-10-CM | POA: Diagnosis not present

## 2022-10-16 DIAGNOSIS — M79631 Pain in right forearm: Secondary | ICD-10-CM | POA: Insufficient documentation

## 2022-10-16 MED ORDER — HYDROCODONE-ACETAMINOPHEN 5-325 MG PO TABS: 1.0000 | ORAL_TABLET | Freq: Four times a day (QID) | ORAL | 0 refills | Status: AC | PRN

## 2022-10-16 MED ORDER — OXYCODONE HCL 5 MG PO TABS
5.0000 mg | ORAL_TABLET | Freq: Once | ORAL | Status: AC
  Administered 2022-10-16: 5 mg via ORAL
  Filled 2022-10-16: qty 1
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
## 2022-10-30 ENCOUNTER — Other Ambulatory Visit: Payer: Self-pay | Admitting: Cardiovascular Disease

## 2022-10-30 DIAGNOSIS — I482 Chronic atrial fibrillation, unspecified: Secondary | ICD-10-CM

## 2022-10-30 NOTE — Telephone Encounter (Signed)
Xarelto 20mg  refill request received. Pt is 72 years old, weight-122.5kg, Crea-1.17 on 06/17/22, last seen by Reather Littler on 09/29/22, Diagnosis-Afib, CrCl-101.79 mL/min; Dose is appropriate based on dosing criteria. Will send in refill to requested pharmacy.

## 2022-10-30 NOTE — Telephone Encounter (Signed)
Please review

## 2022-12-20 ENCOUNTER — Other Ambulatory Visit: Payer: Self-pay | Admitting: Cardiovascular Disease

## 2023-01-10 ENCOUNTER — Other Ambulatory Visit: Payer: Self-pay | Admitting: Cardiovascular Disease

## 2023-03-04 ENCOUNTER — Ambulatory Visit: Payer: Medicare HMO

## 2023-03-11 ENCOUNTER — Ambulatory Visit: Payer: Medicare HMO

## 2023-03-18 ENCOUNTER — Other Ambulatory Visit: Payer: Self-pay | Admitting: Cardiovascular Disease

## 2023-04-02 ENCOUNTER — Encounter: Payer: Self-pay | Admitting: Cardiovascular Disease

## 2023-04-02 ENCOUNTER — Ambulatory Visit: Payer: Medicare HMO | Attending: Cardiovascular Disease | Admitting: Cardiovascular Disease

## 2023-04-02 VITALS — BP 138/78 | HR 84 | Ht 67.0 in | Wt 269.6 lb

## 2023-04-02 DIAGNOSIS — G4730 Sleep apnea, unspecified: Secondary | ICD-10-CM

## 2023-04-02 DIAGNOSIS — I10 Essential (primary) hypertension: Secondary | ICD-10-CM

## 2023-04-02 DIAGNOSIS — E785 Hyperlipidemia, unspecified: Secondary | ICD-10-CM | POA: Diagnosis not present

## 2023-04-02 DIAGNOSIS — I482 Chronic atrial fibrillation, unspecified: Secondary | ICD-10-CM | POA: Diagnosis not present

## 2023-04-02 NOTE — Patient Instructions (Signed)
 Medication Instructions:  No changes *If you need a refill on your cardiac medications before your next appointment, please call your pharmacy*   Lab Work: None ordered If you have labs (blood work) drawn today and your tests are completely normal, you will receive your results only by: MyChart Message (if you have MyChart) OR A paper copy in the mail If you have any lab test that is abnormal or we need to change your treatment, we will call you to review the results.   Testing/Procedures: None ordered   Follow-Up: At [facility], you and your health needs are our priority.  As part of our continuing mission to provide you with exceptional heart care, we have created designated Provider Care Teams.  These Care Teams include your primary Cardiologist (physician) and Advanced Practice Providers (APPs -  Physician Assistants and Nurse Practitioners) who all work together to provide you with the care you need, when you need it.  We recommend signing up for the patient portal called "MyChart".  Sign up information is provided on this After Visit Summary.  MyChart is used to connect with patients for Virtual Visits (Telemedicine).  Patients are able to view lab/test results, encounter notes, upcoming appointments, etc.  Non-urgent messages can be sent to your provider as well.   To learn more about what you can do with MyChart, go to [email or web address].    Your next appointment:   6 month(s)  Provider:   You may see Lorine Bears, MD or one of the following Advanced Practice Providers on your designated Care Team:   Nicolasa Ducking, NP Eula Listen, PA-C Cadence Fransico Michael, PA-C Charlsie Quest, NP Carlos Levering, NP

## 2023-04-02 NOTE — Progress Notes (Signed)
 Cardiology Office Note   Date:  04/02/2023   ID:  Mason Houston, DOB 1950/06/07, MRN [id number]  PCP:  Gracelyn Nurse, MD  Cardiologist:   Lorine Bears, MD   No chief complaint on file.      History of Present Illness: Mason Houston is a 73 y.o. male who presents for a followup visit regarding chronic atrial fibrillation. The patient has a long history of  hypertension and obesity. He was diagnosed with atrial fibrillation with rapid ventricular response during a routine physical  in December of 2013 in the setting of heavy caffeine intake. He also has sleep apnea currently effectively treated with CPAP.  He had a stroke in August 2015.   He was diagnosed with non-Hodgkin's lymphoma in 2017 and was treated successfully with chemotherapy.   He is being treated with rate control for atrial fibrillation given lack of symptoms.  Most recent echocardiogram in August 2022 showed normal LV systolic function and mild mitral regurgitation.   He has been doing well overall with no chest pain or worsening dyspnea.  He was recently started on Ozempic for diabetes and has been doing well.  Past Medical History:  Diagnosis Date   Atherosclerosis    Atrial fibrillation (HCC)    Cancer (HCC)    Chronic bronchitis (HCC)    Collagen vascular disease (HCC)    Depression    Diabetes mellitus without complication (HCC)    Dysrhythmia    Fibromyalgia    GERD (gastroesophageal reflux disease)    H/O Bell's palsy    History of [place] measles    Hypertension    Mild mitral regurgitation by prior echocardiogram    Mild tricuspid regurgitation by prior echocardiogram    Mini stroke    Morbid obesity with BMI of 40.0-44.9, adult (HCC)    Non Hodgkin's lymphoma (HCC) 2017   Osteoarthrosis, unspecified whether generalized or localized, lower leg    Sixth cranial nerve palsy    Sleep apnea    CPAP   Stroke (HCC)    "Mini strokes". None since 2015 (went on Xarelto)   Vertigo     Wears dentures    full upper    Past Surgical History:  Procedure Laterality Date   CATARACT EXTRACTION W/PHACO Left 08/13/2022   Procedure: CATARACT EXTRACTION PHACO AND INTRAOCULAR LENS PLACEMENT (IOC) LEFT MALYUGIN DIABETIC 9.15 00:45.7;  Surgeon: Lockie Mola, MD;  Location: [facility];  Service: Ophthalmology;  Laterality: Left;   CATARACT EXTRACTION W/PHACO Right 08/27/2022   Procedure: CATARACT EXTRACTION PHACO AND INTRAOCULAR LENS PLACEMENT (IOC) RIGHT MALYUGIN DIABETIC 8.11 00:44.0;  Surgeon: Lockie Mola, MD;  Location: [facility];  Service: Ophthalmology;  Laterality: Right;   COLONOSCOPY  2012   Dr Bluford Kaufmann   COLONOSCOPY WITH PROPOFOL N/A 09/09/2017   Procedure: COLONOSCOPY WITH PROPOFOL;  Surgeon: Toledo, Boykin Nearing, MD;  Location: [facility];  Service: Gastroenterology;  Laterality: N/A;   COLONOSCOPY WITH PROPOFOL N/A 09/21/2018   Procedure: COLONOSCOPY WITH PROPOFOL;  Surgeon: Toledo, Boykin Nearing, MD;  Location: [facility] END[facility];  Service: Gastroenterology;  Laterality: N/A;   CYST EXCISION     ERCP N/A 12/29/2014   Procedure: ENDOSCOPIC RETROGRADE CHOLANGIOPANCREATOGRAPHY (ERCP);  Surgeon: Wallace Cullens, MD;  Location: [facility];  Service: Endoscopy;  Laterality: N/A;   ERCP N/A 02/21/2015   Procedure: ENDOSCOPIC RETROGRADE CHOLANGIOPANCREATOGRAPHY (ERCP);  Surgeon: Wallace Cullens, MD;  Location: [facility] END[facility]COPY;  Service: Gastroenterology;  Laterality: N/A;   ERCP N/A 08/14/2015  Procedure: ENDOSCOPIC RETROGRADE CHOLANGIOPANCREATOGRAPHY (ERCP) Stent removal;  Surgeon: Midge Minium, MD;  Location: [facility] ENDOSCOPY;  Service: Endoscopy;  Laterality: N/A;   PERIPHERAL VASCULAR CATHETERIZATION N/A 01/31/2015   Procedure: Shelda Pal Cath Insertion;  Surgeon: Renford Dills, MD;  Location: [facility];  Service: Cardiovascular;  Laterality: N/A;   PERIPHERAL VASCULAR CATHETERIZATION N/A 12/18/2015   Procedure: Shelda Pal Cath Removal;  Surgeon: Renford Dills, MD;   Location: [facility];  Service: Cardiovascular;  Laterality: N/A;   TRIGGER FINGER RELEASE       Current Outpatient Medications  Medication Sig Dispense Refill   ACETAMINOPHEN ER PO Take 1,000 mg by mouth daily.     Ascorbic Acid (VITAMIN C) 1000 MG tablet Take 1,000 mg by mouth daily.     b complex vitamins capsule Take 1 capsule by mouth daily.      cholecalciferol (VITAMIN D) 1000 units tablet Take 1,000 Units by mouth daily.     diltiazem (TIAZAC) 360 MG 24 hr capsule TAKE 1 CAPSULE BY MOUTH DAILY 90 capsule 0   glipiZIDE (GLUCOTROL) 10 MG tablet Take 10 mg by mouth daily before breakfast.     HYDROcodone-acetaminophen (NORCO/VICODIN) 5-325 MG tablet Take 1 tablet by mouth every 6 (six) hours as needed. 15 tablet 0   loratadine (CLARITIN) 10 MG tablet Take 10 mg by mouth daily as needed.      losartan (COZAAR) 50 MG tablet Take 2 tablets by mouth once daily 180 tablet 0   magnesium oxide (MAG-OX) 400 (241.3 Mg) MG tablet Take 250 mg daily by mouth.      meclizine (ANTIVERT) 25 MG tablet Take 25 mg by mouth 3 (three) times daily as needed for dizziness.     metFORMIN (GLUCOPHAGE) 1000 MG tablet Take 1,000 mg by mouth 2 (two) times daily.     omeprazole (PRILOSEC) 20 MG capsule Take 1 capsule by mouth once daily 90 capsule 0   OZEMPIC, 0.25 OR 0.5 MG/DOSE, 2 MG/3ML SOPN SMARTSIG:0.25 Milligram(s) SUB-Q Once a Week     rivaroxaban (XARELTO) 20 MG TABS tablet TAKE 1 TABLET BY MOUTH ONCE DAILY WITH SUPPER 90 tablet 1   rosuvastatin (CRESTOR) 10 MG tablet TAKE 1 TABLET(10 MG) BY MOUTH DAILY 90 tablet 0   No current facility-administered medications for this visit.    Allergies:   Celebrex [celecoxib], Other, and Penicillins    Social History:  The patient  reports that he quit smoking about 42 years ago. His smoking use included cigarettes. He started smoking about 57 years ago. He has a 67.5 pack-year smoking history. He has never used smokeless tobacco. He reports that he  does not drink alcohol and does not use drugs.   Family History:  The patient's family history includes Diabetes in his paternal grandfather and paternal grandmother; Heart attack in his father; Hypertension in his mother; Stroke in his father.    ROS:  Please see the history of present illness.   Otherwise, review of systems are positive for none.   All other systems are reviewed and negative.    PHYSICAL EXAM: VS:  BP 138/78 (BP Location: Left Arm, Patient Position: Sitting)   Pulse 84   Ht 5\' 7"  (1.702 m)   Wt 269 lb 9.6 oz (122.3 kg)   SpO2 97%   BMI 42.23 kg/m  , BMI Body mass index is 42.23 kg/m. GEN: Well nourished, well developed, in no acute distress  HEENT: normal  Neck: no JVD, carotid bruits, or masses  Cardiac: Irregularly irregular; no murmurs, rubs, or gallops, trace leg edema  Respiratory:  clear to auscultation bilaterally, normal work of breathing GI: soft, nontender, nondistended, + BS MS: no deformity or atrophy  Skin: warm and dry, no rash Neuro:  Strength and sensation are intact Psych: euthymic mood, full affect   EKG:  EKG is  ordered today. EKG showed : Atrial fibrillation with premature ventricular or aberrantly conducted complexes Right bundle branch block When compared with ECG of 29-Sep-2022 10:28, Left anterior fascicular block is no longer Present T wave inversion now evident in Anterior leads       Recent Labs: 06/17/2022: ALT 19; BUN 22; Creatinine 1.17; Hemoglobin 13.6; Platelet Count 394; Potassium 4.6; Sodium 137    Lipid Panel    Component Value Date/Time   CHOL 139 05/08/2016 1152   CHOL 181 07/03/2015 1007   TRIG 69 05/08/2016 1152   HDL 42 05/08/2016 1152   HDL 44 07/03/2015 1007   CHOLHDL 3.3 05/08/2016 1152   VLDL 14 05/08/2016 1152   LDLCALC 83 05/08/2016 1152   LDLCALC 122 (H) 07/03/2015 1007      Wt Readings from Last 3 Encounters:  04/02/23 269 lb 9.6 oz (122.3 kg)  10/16/22 270 lb (122.5 kg)  09/29/22 272 lb  3.2 oz (123.5 kg)        ASSESSMENT AND PLAN:   1.    Chronic atrial fibrillation:  Ventricular rate is controlled with diltiazem.  He is tolerating anticoagulation with Xarelto with no side effects.  I reviewed most recent labs done in January which showed normal renal function.  His hemoglobin was mildly reduced at 13.3.  2. Essential hypertension: His blood pressure is well controlled on diltiazem and losartan.   3.  Hyperlipidemia: Most recent lipid profile in January showed an LDL of 48.  Continue rosuvastatin.   4. Sleep apnea: on CPAP  5. Morbid obesity: I again discussed with him the importance of at least 30 minutes of exercise daily and healthy diet.  He was recently started on Ozempic and hopefully this will help.  7.  Lower extremity edema: This is overall mild and likely due to diltiazem.  No signs of heart failure.    Disposition:   FU with me in 6 months  Signed,  Lorine Bears, MD  04/02/2023 9:37 AM    [facility]

## 2023-04-10 ENCOUNTER — Other Ambulatory Visit: Payer: Self-pay | Admitting: Cardiovascular Disease

## 2023-04-13 ENCOUNTER — Ambulatory Visit: Payer: Medicare HMO | Admitting: Physician Assistant

## 2023-04-13 ENCOUNTER — Encounter: Payer: Self-pay | Admitting: Physician Assistant

## 2023-04-13 VITALS — BP 135/76 | HR 89 | Temp 98.3°F | Resp 16 | Ht 67.0 in | Wt 269.8 lb

## 2023-04-13 DIAGNOSIS — Z6841 Body Mass Index (BMI) 40.0 and over, adult: Secondary | ICD-10-CM

## 2023-04-13 DIAGNOSIS — G4733 Obstructive sleep apnea (adult) (pediatric): Secondary | ICD-10-CM

## 2023-04-13 DIAGNOSIS — Z7189 Other specified counseling: Secondary | ICD-10-CM | POA: Diagnosis not present

## 2023-04-13 NOTE — Patient Instructions (Signed)
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
## 2023-05-01 ENCOUNTER — Other Ambulatory Visit: Payer: Self-pay | Admitting: Cardiovascular Disease

## 2023-05-01 DIAGNOSIS — I482 Chronic atrial fibrillation, unspecified: Secondary | ICD-10-CM

## 2023-05-04 NOTE — Telephone Encounter (Signed)
 Prescription refill request for Xarelto  received.  Indication: AF Last office visit: 3/25 Weight: 122.4 kg Age:73 Scr: 1 CrCl: 117

## 2023-06-16 ENCOUNTER — Other Ambulatory Visit: Payer: Self-pay | Admitting: Cardiovascular Disease

## 2023-06-17 ENCOUNTER — Ambulatory Visit: Payer: Medicare HMO | Admitting: Internal Medicine

## 2023-06-17 ENCOUNTER — Other Ambulatory Visit: Payer: Medicare HMO

## 2023-09-16 ENCOUNTER — Telehealth: Payer: Self-pay | Admitting: Cardiovascular Disease

## 2023-09-16 NOTE — Telephone Encounter (Signed)
 Pt c/o Syncope: STAT if syncope occurred within 24 hours and pt complains of lightheadedness  Did you pass out today?  No   When is the last time you passed out?  1st time was 07/04/23, 3 times after June and most recent time was this past Friday 09/11/23   Has this occurred multiple times?  Yes   Did you have any symptoms prior to passing out?  No   5. Did you fall? Yes - If so, are you on a blood thinner? No

## 2023-09-16 NOTE — Telephone Encounter (Signed)
 He also stated he did speak with his PCP about it and they suggested he reach out to Cardiology for possible heart monitor

## 2023-09-16 NOTE — Telephone Encounter (Signed)
 Called patient. No answer and unable to leave message at this time.

## 2023-09-17 ENCOUNTER — Encounter: Payer: Self-pay | Admitting: Physician Assistant

## 2023-09-17 ENCOUNTER — Ambulatory Visit: Attending: Physician Assistant | Admitting: Physician Assistant

## 2023-09-17 ENCOUNTER — Ambulatory Visit

## 2023-09-17 VITALS — BP 118/84 | HR 76 | Ht 67.0 in | Wt 266.0 lb

## 2023-09-17 DIAGNOSIS — I10 Essential (primary) hypertension: Secondary | ICD-10-CM | POA: Diagnosis not present

## 2023-09-17 DIAGNOSIS — G4730 Sleep apnea, unspecified: Secondary | ICD-10-CM

## 2023-09-17 DIAGNOSIS — R55 Syncope and collapse: Secondary | ICD-10-CM

## 2023-09-17 DIAGNOSIS — E785 Hyperlipidemia, unspecified: Secondary | ICD-10-CM

## 2023-09-17 DIAGNOSIS — I482 Chronic atrial fibrillation, unspecified: Secondary | ICD-10-CM

## 2023-09-17 NOTE — Progress Notes (Signed)
 Cardiology Office Note    Date:  09/17/2023   ID:  Mason Houston, DOB 1950-07-14, MRN [id number]  PCP:  Mason Norleen JONETTA, MD  Cardiologist:  Mason Cage, MD  Electrophysiologist:  None   Chief Complaint: Recurrent syncope  History of Present Illness:   Mason Houston is a 73 y.o. male with history of atrial fibrillation, OSA on CPAP, CVA, non Hodgkin's lymphoma s/p chemotherapy, hypertension, and obesity who presents for evaluation of recurrent syncope.     Patient was diagnosed with atrial fibrillation in 2013 after a routine physical revealed tachycardia with irregular heart rhythm and EKG confirming atrial fibrillation with RVR at 128 bpm.  He was largely asymptomatic but was directed to report to the ED for further evaluation.  He was started on diltiazem . CHA2DS2VASc was 1 and he was not started on anticoagulation initially. However, he had a stroke in 2015 and was subsequently started on Xarelto .  Most recent echo 08/2020 showed EF of 55 to 60%, mildly dilated left atrium, mild MR.  He has been rate controlled on diltiazem  and largely asymptomatic to his atrial fibrillation.   Patient was most recently seen by Dr. Cage 03/2023 and overall doing well from a cardiac perspective.  He had recently started Ozempic for diabetes.  No further testing or medication changes were indicated at that time.  Patient presents today with concern for recurrent episodes of syncope.  Patient reports that in late June he was outside watering his garden when he suddenly lost consciousness and woke up laying on the ground.  His son was nearby in the driveway and when the patient came to he saw his son running towards him.  He denies any prodromal symptoms.  He did not hit his head.  He does not recall the weather being especially hot at this time or being dehydrated, although he does note that he sweats quite profusely.  He attributes this to being overweight.  In the following weeks he reports having  several episodes of lightheadedness when going from a sitting to standing position during which he feared he may pass out again.  This past Friday, 9/5 he was walking on a track near his house when he had another episode of syncope.  He reports that 3 other individuals walking the track had just passed by him and when he came to these individuals were running towards him.  He did not hit his head.  Again during this episode he had no symptoms prior to the event.  He denies any associated shortness of breath, lightheadedness, dizziness, or chest pain.    He has recently been working to lose weight in order to have his right knee replaced.  His orthopedic surgeon told him that his BMI needs to be less than 40 in order to have the operation.  He has lost 13 pounds in the last month with the assistance of Ozempic and increased exercise.  However, he is now hesitant to continue exercising due to recurrent episodes of syncope.  He reports compliance with his medication without adverse effect.  He denies bleeding and hematochezia.  Labs independently reviewed: 09/2023-sodium 139, potassium 5.2, BUN 21, creatinine 1.3, A1c 8.5 01/2023-Hgb 13.3, HCT 40, platelets 342, TC 104, TG 94, HDL 36, LDL 48  Objective   Past Medical History:  Diagnosis Date   Atherosclerosis    Atrial fibrillation (HCC)    Cancer (HCC)    Chronic bronchitis (HCC)    Collagen vascular disease (HCC)  Depression    Diabetes mellitus without complication (HCC)    Dysrhythmia    Fibromyalgia    GERD (gastroesophageal reflux disease)    H/O Bell's palsy    History of [place] measles    Hypertension    Mild mitral regurgitation by prior echocardiogram    Mild tricuspid regurgitation by prior echocardiogram    Mini stroke    Morbid obesity with BMI of 40.0-44.9, adult (HCC)    Non Hodgkin's lymphoma (HCC) 2017   Osteoarthrosis, unspecified whether generalized or localized, lower leg    Sixth cranial nerve palsy    Sleep apnea     CPAP   Stroke (HCC)    Mini strokes. None since 2015 (went on Xarelto )   Vertigo    Wears dentures    full upper    Current Medications: Current Meds  Medication Sig   ACETAMINOPHEN  ER PO Take 1,000 mg by mouth daily.   Ascorbic Acid (VITAMIN C) 1000 MG tablet Take 1,000 mg by mouth daily.   b complex vitamins capsule Take 1 capsule by mouth daily.    cholecalciferol (VITAMIN D ) 1000 units tablet Take 1,000 Units by mouth daily.   diltiazem  (TIAZAC ) 360 MG 24 hr capsule Take 1 capsule by mouth once daily   glipiZIDE (GLUCOTROL) 10 MG tablet Take 10 mg by mouth daily before breakfast.   HYDROcodone -acetaminophen  (NORCO/VICODIN) 5-325 MG tablet Take 1 tablet by mouth every 6 (six) hours as needed.   loratadine  (CLARITIN ) 10 MG tablet Take 10 mg by mouth daily as needed.    losartan  (COZAAR ) 50 MG tablet Take 2 tablets by mouth once daily   magnesium  oxide (MAG-OX) 400 (241.3 Mg) MG tablet Take 250 mg daily by mouth.    meclizine (ANTIVERT) 25 MG tablet Take 25 mg by mouth 3 (three) times daily as needed for dizziness.   metFORMIN (GLUCOPHAGE) 1000 MG tablet Take 1,000 mg by mouth 2 (two) times daily.   omeprazole  (PRILOSEC) 20 MG capsule Take 1 capsule by mouth once daily   OZEMPIC, 0.25 OR 0.5 MG/DOSE, 2 MG/3ML SOPN SMARTSIG:0.25 Milligram(s) SUB-Q Once a Week   rivaroxaban  (XARELTO ) 20 MG TABS tablet TAKE 1 TABLET BY MOUTH ONCE DAILY WITH SUPPER   rosuvastatin  (CRESTOR ) 10 MG tablet TAKE 1 TABLET(10 MG) BY MOUTH DAILY    Allergies:   Celebrex [celecoxib], Other, and Penicillins   Social History   Socioeconomic History   Marital status: Married    Spouse name: Not on file   Number of children: Not on file   Years of education: Not on file   Highest education level: Not on file  Occupational History   Not on file  Tobacco Use   Smoking status: Former    Current packs/day: 0.00    Average packs/day: 4.5 packs/day for 15.0 years (67.5 ttl pk-yrs)    Types: Cigarettes     Start date: 01/06/1966    Quit date: 01/06/1981    Years since quitting: 42.7   Smokeless tobacco: Never  Vaping Use   Vaping status: Never Used  Substance and Sexual Activity   Alcohol use: No   Drug use: No    Types: Marijuana    Comment: PAST   Sexual activity: Not on file  Other Topics Concern   Not on file  Social History Narrative   ** Merged History Encounter **       Social Drivers of Health   Financial Resource Strain: Medium Risk (08/20/2023)   [facility]   Overall Financial Resource Strain (CARDIA)    Difficulty of Paying Living Expenses: Somewhat hard  Food Insecurity: Food Insecurity Present (08/20/2023)   [facility]   Hunger Vital Sign    Within the past 12 months, you worried that your food would run out before you got the money to buy more.: Sometimes true    Within the past 12 months, the food you bought just didn't last and you didn't have money to get more.: Never true  Transportation Needs: No Transportation Needs (08/20/2023)   Received from [facility] - Transportation    In the past 12 months, has lack of transportation kept you from medical appointments or from getting medications?: No    Lack of Transportation (Non-Medical): No  Recent Concern: Transportation Needs - Unmet Transportation Needs (06/25/2023)   Received from [facility] - Transportation    In the past 12 months, has lack of transportation kept you from medical appointments or from getting medications?: Yes    Lack of Transportation (Non-Medical): No  Physical Activity: Not on file  Stress: Not on file  Social Connections: Not on file     Family History:  The patient's family history includes Diabetes in his paternal grandfather and paternal grandmother; Heart attack in his father; Hypertension in his mother; Stroke in his father.  ROS:   12-point review of systems is  negative unless otherwise noted in the HPI.  EKGs/Other Studies Reviewed:    Studies reviewed were summarized above. The additional studies were reviewed today:  08/2020 Echo complete 1. Left ventricular ejection fraction, by estimation, is 55 to 60%. The  left ventricle has normal function. The left ventricle has no regional  wall motion abnormalities. Left ventricular diastolic parameters are  indeterminate.   2. Right ventricular systolic function is normal. The right ventricular  size is normal. There is normal pulmonary artery systolic pressure.   3. Left atrial size was mildly dilated.   4. The mitral valve is normal in structure. Mild mitral valve  regurgitation.   5. The aortic valve is grossly normal. Aortic valve regurgitation is not  visualized.   6. The inferior vena cava is normal in size with greater than 50%  respiratory variability, suggesting right atrial pressure of 3 mmHg.    EKG:  EKG personally reviewed by me today EKG Interpretation Date/Time:  Thursday September 17 2023 08:23:01 EDT Ventricular Rate:  76 PR Interval:    QRS Duration:  142 QT Interval:  408 QTC Calculation: 459 R Axis:   -63  Text Interpretation: Atrial fibrillation Right bundle branch block Confirmed by Lorene Sinclair ([id number]) on 09/17/2023 8:24:49 AM  PHYSICAL EXAM:    VS:  BP 118/84 (BP Location: Left Arm, Patient Position: Sitting, Cuff Size: Normal)   Pulse 76 Comment: EKG  Ht 5' 7 (1.702 m)   Wt 266 lb (120.7 kg)   SpO2 98%   BMI 41.66 kg/m   BMI: Body mass index is 41.66 kg/m.  Orthostatic VS for the past 24 hrs:  BP- Lying Pulse- Lying BP- Sitting Pulse- Sitting BP- Standing at 0 minutes Pulse- Standing at 0 minutes  09/17/23 0836 138/78 68 138/74 67 137/86 90     GEN: Well nourished, well developed in no acute distress NECK: No JVD; No carotid bruits CARDIAC: IRIR, no murmurs, rubs, gallops RESPIRATORY:  Clear to auscultation without rales, wheezing or rhonchi  ABDOMEN:  Soft, non-tender, non-distended EXTREMITIES: No edema; No deformity  Wt Readings from Last 3 Encounters:  09/17/23 266 lb (120.7 kg)  04/13/23 269 lb 12.8 oz (122.4 kg)  04/02/23 269 lb 9.6 oz (122.3 kg)       ASSESSMENT & PLAN:   Recurrent syncope - Patient reports 2 episodes of syncope, both of which were during activity and without prodromal symptoms.  Most recent echo 08/2020 showed normal LV systolic function with mild MR.  Orthostatics are negative today.  EKG shows rate controlled atrial fibrillation.  Recommend updating echo and carotid Dopplers.  Will order 2-week ZIO AT to assess for post-termination pauses.  Discussed with patient that if he does not have another episode while wearing monitor, we may need to consider referral to EP for loop implantation.  Also directed patient that he should refrain from driving at this time and he voiced understanding.  Chronic atrial fibrillation - Longstanding history of asymptomatic atrial fibrillation.  EKG shows rate controlled atrial fibrillation.  Continued on diltiazem  360 mg daily and Xarelto  20 mg daily for CHA2DS2-VASc of 5.  Essential hypertension - Blood pressure well-controlled on current doses of diltiazem  and losartan .  Hyperlipidemia - Most recent lipid panel 01/2023 with LDL 48, at goal.  He is continued on rosuvastatin  10 mg daily.  Sleep apnea - Compliant with CPAP  Morbid obesity - Tolerating Ozempic well  Lower extremity edema - Overall mild and likely secondary to diltiazem  use     Disposition: Update echo and carotid dopplers. Zio AT 2 weeks. F/u with Dr. Darron or an APP in 6 weeks/after testing.   Medication Adjustments/Labs and Tests Ordered: Current medicines are reviewed at length with the patient today.  Concerns regarding medicines are outlined above. Medication changes, Labs and Tests ordered today are summarized above and listed in the Patient Instructions accessible in Encounters.   Signed, Lesley Maffucci, PA-C 09/17/2023 2:26 PM     [facility] [place] [address] [place], [place] [address] [phone]

## 2023-09-17 NOTE — Patient Instructions (Addendum)
 Medication Instructions:  Your physician recommends that you continue on your current medications as directed. Please refer to the Current Medication list given to you today.   *If you need a refill on your cardiac medications before your next appointment, please call your pharmacy*  Lab Work: None ordered at this time  If you have labs (blood work) drawn today and your tests are completely normal, you will receive your results only by: MyChart Message (if you have MyChart) OR A paper copy in the mail If you have any lab test that is abnormal or we need to change your treatment, we will call you to review the results.  Testing/Procedures: Your physician has requested that you have an echocardiogram. Echocardiography is a painless test that uses sound waves to create images of your heart. It provides your doctor with information about the size and shape of your heart and how well your heart's chambers and valves are working.   You may receive an ultrasound enhancing agent through an IV if needed to better visualize your heart during the echo. This procedure takes approximately one hour.  There are no restrictions for this procedure.  This will take place at [address] [facility]) [address], [place] [address]  Please note: We ask at that you not bring children with you during ultrasound (echo/ vascular) testing. Due to room size and safety concerns, children are not allowed in the ultrasound rooms during exams. Our front office staff cannot provide observation of children in our lobby area while testing is being conducted. An adult accompanying a patient to their appointment will only be allowed in the ultrasound room at the discretion of the ultrasound technician under special circumstances. We apologize for any inconvenience.  Your physician has requested that you have a carotid duplex. This test is an ultrasound of the carotid arteries in your neck. It looks at blood flow  through these arteries that supply the brain with blood.   Allow one hour for this exam.  There are no restrictions or special instructions.  This will take place at [address] [facility] [address], [place] [address] OR the main Medical Mall  ZIO AT Long term monitor-Live Telemetry  Your physician has requested you wear a ZIO patch monitor for 14 days.  This is a single patch monitor. [employer] supplies one patch monitor per enrollment. Additional  stickers are not available.  Please do not apply patch if you will be having a Nuclear Stress Test, Echocardiogram, Cardiac CT, MRI,  or Chest Xray during the period you would be wearing the monitor. The patch cannot be worn during  these tests. You cannot remove and re-apply the ZIO AT patch monitor.  Your ZIO patch monitor will be mailed 3 day USPS to your address on file. It may take 3-5 days to  receive your monitor after you have been enrolled.  Once you have received your monitor, please review the enclosed instructions. Your monitor has  already been registered assigning a specific monitor serial # to you.   Billing and Patient Assistance Program information  [facility] has been supplied with any insurance information on record for billing. [employer] offers a sliding scale Patient Assistance Program for patients without insurance, or whose  insurance does not completely cover the cost of the ZIO patch monitor. You must apply for the  Patient Assistance Program to qualify for the discounted rate. To apply, call [employer] at [phone],  select option 4, select option 2 ,  ask to apply for the Patient Assistance Program, (you can request an  interpreter if needed). [employer] will ask your household income and how many people are in your  household. [employer] will quote your out-of-pocket cost based on this information. They will also be able  to set up a 12 month interest free payment plan if needed.  Applying the monitor   Shave  hair from upper left chest.  Hold the abrader disc by orange tab. Rub the abrader in 40 strokes over left upper chest as indicated in  your monitor instructions.  Clean area with 4 enclosed alcohol pads. Use all pads to ensure the area is cleaned thoroughly. Let  dry.  Apply patch as indicated in monitor instructions. Patch will be placed under collarbone on left side of  chest with arrow pointing upward.  Rub patch adhesive wings for 2 minutes. Remove the white label marked 1. Remove the white label  marked 2. Rub patch adhesive wings for 2 additional minutes.  While looking in a mirror, press and release button in center of patch. A small green light will flash 3-4  times. This will be your only indicator that the monitor has been turned on.  Do not shower for the first 24 hours. You may shower after the first 24 hours.  Press the button if you feel a symptom. You will hear a small click. Record Date, Time and Symptom in  the Patient Log.   Starting the Gateway  In your kit there is a Audiological scientist box the size of a cellphone. This is Buyer, retail. It transmits all your  recorded data to [facility]. This box must always stay within 10 feet of you. Open the box and push the *  button. There will be a light that blinks orange and then green a few times. When the light stops  blinking, the Gateway is connected to the ZIO patch. Call [employer] at [phone] to confirm your monitor is transmitting.  Returning your monitor  Remove your patch and place it inside the Gateway. In the lower half of the Gateway there is a white  bag with prepaid postage on it. Place Gateway in bag and seal. Mail package back to [employer] as soon as  possible. Your physician should have your final report approximately 7 days after you have mailed back  your monitor. Call [facility] Customer Care at [phone] if you have questions regarding your ZIO AT  patch monitor. Call them immediately if you see  an orange light blinking on your monitor.  If your monitor falls off in less than 4 days, contact our Monitor department at [phone]. If your  monitor becomes loose or falls off after 4 days call [employer] at [phone] for suggestions on  securing your monitor   Follow-Up: At [facility], you and your health needs are our priority.  As part of our continuing mission to provide you with exceptional heart care, our providers are all part of one team.  This team includes your primary Cardiologist (physician) and Advanced Practice Providers or APPs (Physician Assistants and Nurse Practitioners) who all work together to provide you with the care you need, when you need it.  Your next appointment:   6-7 week(s)  Provider:   You may see Deatrice Cage, MD   We recommend signing up for the patient portal called MyChart.  Sign up information is provided on this After Visit Summary.  MyChart is used to connect with patients for Virtual  Visits (Telemedicine).  Patients are able to view lab/test results, encounter notes, upcoming appointments, etc.  Non-urgent messages can be sent to your provider as well.   To learn more about what you can do with MyChart, go to [email or web address].

## 2023-09-21 ENCOUNTER — Ambulatory Visit

## 2023-09-23 ENCOUNTER — Ambulatory Visit (HOSPITAL_COMMUNITY)
Admission: RE | Admit: 2023-09-23 | Discharge: 2023-09-23 | Disposition: A | Discharge status: Home / Self Care | Source: Ambulatory Visit | Attending: Physician Assistant | Admitting: Physician Assistant

## 2023-09-23 DIAGNOSIS — R55 Syncope and collapse: Secondary | ICD-10-CM | POA: Insufficient documentation

## 2023-09-28 ENCOUNTER — Ambulatory Visit: Payer: Self-pay | Admitting: Physician Assistant

## 2023-10-26 ENCOUNTER — Ambulatory Visit: Attending: Physician Assistant

## 2023-10-26 ENCOUNTER — Telehealth: Payer: Self-pay | Admitting: Cardiovascular Disease

## 2023-10-26 DIAGNOSIS — R55 Syncope and collapse: Secondary | ICD-10-CM | POA: Diagnosis not present

## 2023-10-26 LAB — ECHOCARDIOGRAM COMPLETE
AR max vel: 1.65 cm2
AV Area VTI: 1.65 cm2
AV Area mean vel: 1.5 cm2
AV Mean grad: 3 mmHg
AV Peak grad: 6.6 mmHg
Ao pk vel: 1.29 m/s
Area-P 1/2: 5.66 cm2
Calc EF: 65.6 %
S' Lateral: 3.7 cm
Single Plane A2C EF: 58 %
Single Plane A4C EF: 74.1 %

## 2023-10-26 MED ORDER — PERFLUTREN LIPID MICROSPHERE
1.0000 mL | INTRAVENOUS | Status: AC | PRN
  Administered 2023-10-26: 1 mL via INTRAVENOUS

## 2023-10-26 NOTE — Telephone Encounter (Signed)
 Pt has been passing out  10/07 and is concern what to do and pt appointment is too far out.  Pt is having and echo today at 3pm other wise call him on the home phone

## 2023-10-26 NOTE — Telephone Encounter (Signed)
 No answer. Left detailed voicemail to call our office back to discuss patient passing out on 10/7.

## 2023-10-27 NOTE — Telephone Encounter (Signed)
 Patient is returning call.

## 2023-10-27 NOTE — Telephone Encounter (Signed)
 Spoke with patient who reports continuing episodes of lightheadedness while walking. He describes the sensation as a "weird feeling" in his head, which worsens with increased walking speed. On 10/13/23, while blowing leaves in his yard, he developed symptoms and subsequently woke up on the ground. He is unsure how long he was unconscious. However, he checked his blood pressure and heart rate afterward, which were within normal limits. Although he does not recall the exact numbers, he noted that the blood pressure machine displayed a green indicator, suggesting normal readings.  The appointment has been moved up to Thursday, 10/29/23, for further evaluation. The patient was advised to go to the emergency department if symptoms reoccur.

## 2023-10-28 ENCOUNTER — Other Ambulatory Visit: Payer: Self-pay | Admitting: Cardiovascular Disease

## 2023-10-28 DIAGNOSIS — I482 Chronic atrial fibrillation, unspecified: Secondary | ICD-10-CM

## 2023-10-28 NOTE — Telephone Encounter (Signed)
 Prescription refill request for Xarelto  received.  Indication:afib Last office visit:9/25 Weight:120.7  kg Age:73 Scr:1.3  9/25 CrCl:88.98  ml/min  Prescription refilled

## 2023-10-28 NOTE — Progress Notes (Unsigned)
 Cardiology Office Note    Date:  10/29/2023   ID:  BREEZE ANGELL, DOB 1950-06-30, MRN [id number]  PCP:  Rudolpho Norleen JONETTA, MD  Cardiologist:  Deatrice Cage, MD  Electrophysiologist:  None   Chief Complaint: Follow up  History of Present Illness:   Mason Houston is a 73 y.o. male with history of permanent atrial fibrillation, OSA on CPAP, CVA, non-Hodgkin's lymphoma s/p chemotherapy, hypertension, and obesity who presents for follow-up on recurrent syncope.    Patient was diagnosed with atrial fibrillation in 2013 after a routine physical revealed tachycardia with irregular heart rhythm and EKG confirming atrial fibrillation with RVR at 128 bpm.  He was largely asymptomatic but was directed to report to the ED for further evaluation.  He was started on diltiazem . CHA2DS2VASc was 1 and he was not started on anticoagulation initially. However, he had a stroke in 2015 and was subsequently started on Xarelto .  Most recent echo 08/2020 showed EF of 55 to 60%, mildly dilated left atrium, mild MR.  He has been rate controlled on diltiazem  and largely asymptomatic to his atrial fibrillation.   Patient was most recently seen by myself 09/17/2023 and reported recurrent episodes of syncope since late June.  The first episode was when he was working in his garden and suddenly lost consciousness and woke up laying on the ground.  His son was nearby the driveway and when the patient came to he saw his son running towards him.  He denies any prodromal symptoms.  He did not hit his head.  He does not recall the weather being especially hot at this time or being dehydrated, although he does note that he sweats quite profusely.  He attributes this to being overweight.  In the following weeks he reports having several episodes of lightheadedness when going from a sitting to standing position during which he feared he may pass out again.  On 9/5 he was walking on a track near his house when he had another  episode of syncope.  He reports that 3 other individuals were walking on the track and had just passed by him and when he came to these individuals were running towards him.  He did not hit his head.  Again during this episode he had no symptoms prior to the event.  He denied any associated shortness of breath, lightheadedness, dizziness, or chest pain.  Patient presents today after another episode of syncope on 10/7.  He reports that he was leaf blowing his driveway which is not been physically challenging for him.  He had an episode of lightheadedness and paused to regain his bearings then resumed leaf blowing and suddenly passed out shortly after. This was not witnessed but he believed he was out for less than a minute. He unfortunately was no longer wearing the ZIO monitor at the time.  He reports some mild lightheadedness after he came to but otherwise felt normal after about 15 minutes.  He reports falling hard and having back and shoulder pain after the fact. He was unable to sleep in his bed due to discomfort from the fall up until this week. He reports eating and drinking normally on that day. He continues to have intermittent episodes of lightheadedness. He has been fairly sedentary recently due to fear of recurrent syncope. He is discouraged because prior to his first episode of syncope, he was exercising daily and eating healthy to lose weight. He denies chest pain, shortness of breath, and palpitations.  Labs independently reviewed: 09/2023-sodium 139, potassium 5.2, BUN 21, creatinine 1.3, A1c 8.5 01/2023-Hgb 13.3, HCT 40, platelets 342, TC 104, TG 94, HDL 36, LDL 48  Objective   Past Medical History:  Diagnosis Date   Atherosclerosis    Atrial fibrillation (HCC)    Cancer (HCC)    Chronic bronchitis (HCC)    Collagen vascular disease    Depression    Diabetes mellitus without complication (HCC)    Dysrhythmia    Fibromyalgia    GERD (gastroesophageal reflux disease)    H/O Bell's  palsy    History of [place] measles    Hypertension    Mild mitral regurgitation by prior echocardiogram    Mild tricuspid regurgitation by prior echocardiogram    Mini stroke    Morbid obesity with BMI of 40.0-44.9, adult (HCC)    Non Hodgkin's lymphoma (HCC) 2017   Osteoarthrosis, unspecified whether generalized or localized, lower leg    Sixth cranial nerve palsy    Sleep apnea    CPAP   Stroke (HCC)    Mini strokes. None since 2015 (went on Xarelto )   Vertigo    Wears dentures    full upper    Current Medications: Current Meds  Medication Sig   ACETAMINOPHEN  ER PO Take 1,000 mg by mouth daily.   Ascorbic Acid (VITAMIN C) 1000 MG tablet Take 1,000 mg by mouth daily.   b complex vitamins capsule Take 1 capsule by mouth daily.    cholecalciferol (VITAMIN D ) 1000 units tablet Take 1,000 Units by mouth daily.   diltiazem  (TIAZAC ) 360 MG 24 hr capsule Take 1 capsule by mouth once daily   glipiZIDE (GLUCOTROL) 10 MG tablet Take 10 mg by mouth daily before breakfast.   loratadine  (CLARITIN ) 10 MG tablet Take 10 mg by mouth daily as needed.    losartan  (COZAAR ) 50 MG tablet Take 2 tablets by mouth once daily   magnesium  oxide (MAG-OX) 400 (241.3 Mg) MG tablet Take 250 mg daily by mouth.    meclizine (ANTIVERT) 25 MG tablet Take 25 mg by mouth 3 (three) times daily as needed for dizziness.   metFORMIN (GLUCOPHAGE) 1000 MG tablet Take 1,000 mg by mouth 2 (two) times daily.   metoprolol  tartrate (LOPRESSOR ) 100 MG tablet TAKE 1 TABLET 2 HR PRIOR TO CARDIAC PROCEDURE   omeprazole  (PRILOSEC) 20 MG capsule Take 1 capsule by mouth once daily   OZEMPIC, 0.25 OR 0.5 MG/DOSE, 2 MG/3ML SOPN SMARTSIG:0.25 Milligram(s) SUB-Q Once a Week   rivaroxaban  (XARELTO ) 20 MG TABS tablet TAKE 1 TABLET BY MOUTH ONCE DAILY WITH SUPPER   rosuvastatin  (CRESTOR ) 10 MG tablet TAKE 1 TABLET(10 MG) BY MOUTH DAILY    Allergies:   Celebrex [celecoxib], Other, and Penicillins   Social History   Socioeconomic  History   Marital status: Married    Spouse name: Not on file   Number of children: Not on file   Years of education: Not on file   Highest education level: Not on file  Occupational History   Not on file  Tobacco Use   Smoking status: Former    Current packs/day: 0.00    Average packs/day: 4.5 packs/day for 15.0 years (67.5 ttl pk-yrs)    Types: Cigarettes    Start date: 01/06/1966    Quit date: 01/06/1981    Years since quitting: 42.8   Smokeless tobacco: Never  Vaping Use   Vaping status: Never Used  Substance and Sexual Activity   Alcohol use: No  Drug use: No    Types: Marijuana    Comment: PAST   Sexual activity: Not on file  Other Topics Concern   Not on file  Social History Narrative   ** Merged History Encounter **       Social Drivers of Health   Financial Resource Strain: Medium Risk (08/20/2023)   Received from [facility]   Overall Financial Resource Strain (CARDIA)    Difficulty of Paying Living Expenses: Somewhat hard  Food Insecurity: Food Insecurity Present (08/20/2023)   Received from [facility]   Hunger Vital Sign    Within the past 12 months, you worried that your food would run out before you got the money to buy more.: Sometimes true    Within the past 12 months, the food you bought just didn't last and you didn't have money to get more.: Never true  Transportation Needs: No Transportation Needs (08/20/2023)   Received from [facility] - Transportation    In the past 12 months, has lack of transportation kept you from medical appointments or from getting medications?: No    Lack of Transportation (Non-Medical): No  Recent Concern: Transportation Needs - Unmet Transportation Needs (06/25/2023)   Received from [facility] - Transportation    In the past 12 months, has lack of transportation kept you from medical appointments or from getting medications?: Yes     Lack of Transportation (Non-Medical): No  Physical Activity: Not on file  Stress: Not on file  Social Connections: Not on file     Family History:  The patient's family history includes Diabetes in his paternal grandfather and paternal grandmother; Heart attack in his father; Hypertension in his mother; Stroke in his father.  ROS:   12-point review of systems is negative unless otherwise noted in the HPI.  EKGs/Other Studies Reviewed:    Studies reviewed were summarized above. The additional studies were reviewed today:  10/26/2023 Echo complete 1. Left ventricular ejection fraction, by estimation, is 60 to 65%. Left  ventricular ejection fraction by 2D MOD biplane is 65.6 %. The left  ventricle has normal function. The left ventricle has no regional wall  motion abnormalities. There is mild left  ventricular hypertrophy. Left ventricular diastolic parameters are  indeterminate.   2. Right ventricular systolic function is normal. The right ventricular  size is normal.   3. The mitral valve is normal in structure. No evidence of mitral valve  regurgitation.   4. The aortic valve is tricuspid. Aortic valve regurgitation is not  visualized.   5. The inferior vena cava is normal in size with greater than 50%  respiratory variability, suggesting right atrial pressure of 3 mmHg.   10/19/2023 Live telemetry monitoring Atrial Fibrillation occurred continuously (100% burden), ranging from 42-192 bpm (avg of 73 bpm). Bundle Branch Block/IVCD was present. Isolated VEs were rare (<1.0%), VE Couplets were rare (<1.0%), and no VE Triplets were present.  09/2023 Carotid duplex Right Carotid: Velocities in the right ICA are consistent with a 1-39% stenosis.  Left Carotid: Velocities in the left ICA are consistent with a 1-39% stenosis.  Vertebrals: Bilateral vertebral arteries demonstrate antegrade flow.  Subclavians: Normal flow hemodynamics were seen in bilateral subclavian arteries.   EKG:   EKG personally reviewed by me today EKG Interpretation Date/Time:  Thursday October 29 2023 14:47:29 EDT Ventricular Rate:  84 PR Interval:    QRS Duration:  142 QT Interval:  414 QTC Calculation: 489 R Axis:   -61  Text Interpretation: Atrial fibrillation Right bundle branch block When compared with ECG of 17-Sep-2023 08:23, No significant change was found Confirmed by Lorene Sinclair [phone]) on 10/29/2023 2:57:57 PM  PHYSICAL EXAM:    VS:  BP (!) 145/70 (BP Location: Left Arm, Patient Position: Sitting, Cuff Size: Normal)   Pulse 84 Comment: 80 oximeter  Ht 5' 7 (1.702 m)   Wt 275 lb 3.2 oz (124.8 kg)   SpO2 98%   BMI 43.10 kg/m   BMI: Body mass index is 43.1 kg/m.  GEN: Well nourished, well developed in no acute distress NECK: No JVD; No carotid bruits CARDIAC: IRIR, no murmurs, rubs, gallops RESPIRATORY:  Clear to auscultation without rales, wheezing or rhonchi  ABDOMEN: Soft, non-tender, non-distended EXTREMITIES: No edema; No deformity  Wt Readings from Last 3 Encounters:  10/29/23 275 lb 3.2 oz (124.8 kg)  09/17/23 266 lb (120.7 kg)  04/13/23 269 lb 12.8 oz (122.4 kg)        Orthostatic VS for the past 24 hrs (Last 3 readings):  BP- Lying Pulse- Lying BP- Sitting Pulse- Sitting BP- Standing at 0 minutes Pulse- Standing at 0 minutes BP- Standing at 3 minutes Pulse- Standing at 3 minutes  10/29/23 1533 150/84 79 140/79 77 138/80 67 155/82 84           ASSESSMENT & PLAN:   Recurrent syncope - 2 prior episodes of syncope the first of which occurred in late June and the second of which was 9/5.  He reports another episode of syncope 10/7 during which she was leaf blowing in the yard.  He reports brief lightheadedness prior then sudden syncope.  Orthostatics are negative again today.  Echo 10/20 showed EF 60 to 65% with mild LVH and no significant valvular abnormalities.  Carotid Dopplers showed mild stenosis bilaterally.  ZIO monitor showed no permanent atrial  fibrillation without evidence of sustained arrhythmia or pauses.  Recommend coronary CTA to rule out ischemic cause.  Recommend referral to EP for consideration of loop implantation.  Strict ED precautions discussed with patient in addition to abstaining from driving per [place]  [employer].  Chronic atrial fibrillation - Longstanding history of asymptomatic atrial fibrillation.  Remains in rate controlled A-fib today.  He is continued on diltiazem  360 mg daily and Xarelto  20 mg daily for CHA2DS2-VASc of 5.  Essential hypertension - BP reasonably well-controlled on current regimen.  Hyperlipidemia - Most recent lipid panel 01/2023 with LDL 48, at goal.  He is continued on rosuvastatin  10 mg daily.  Sleep apnea - Compliant with CPAP  Morbid obesity - Working towards weight loss although has been difficult due to worry surrounding syncope  Lower extremity edema - Overall mild and likely secondary to diltiazem  use.  Not requiring diuretic.  T2DM - Most recent A1c 8.5  Disposition: Obtain coronary CTA.  Update BMP.  Refer to EP for possible loop recorder implantation.  F/u with Dr. Darron or an APP in 1 month or sooner after testing.   Medication Adjustments/Labs and Tests Ordered: Current medicines are reviewed at length with the patient today.  Concerns regarding medicines are outlined above. Medication changes, Labs and Tests ordered today are summarized above and listed in the Patient Instructions accessible in Encounters.   Signed, Sinclair Lorene, PA-C 10/29/2023 4:28 PM     [facility] [place] [address] [place], [place] [address] [phone]

## 2023-10-29 ENCOUNTER — Ambulatory Visit: Attending: Cardiology | Admitting: Physician Assistant

## 2023-10-29 ENCOUNTER — Encounter: Payer: Self-pay | Admitting: Cardiology

## 2023-10-29 VITALS — BP 145/70 | HR 84 | Ht 67.0 in | Wt 275.2 lb

## 2023-10-29 DIAGNOSIS — I482 Chronic atrial fibrillation, unspecified: Secondary | ICD-10-CM | POA: Diagnosis not present

## 2023-10-29 DIAGNOSIS — G4733 Obstructive sleep apnea (adult) (pediatric): Secondary | ICD-10-CM

## 2023-10-29 DIAGNOSIS — E785 Hyperlipidemia, unspecified: Secondary | ICD-10-CM | POA: Diagnosis not present

## 2023-10-29 DIAGNOSIS — R55 Syncope and collapse: Secondary | ICD-10-CM | POA: Diagnosis not present

## 2023-10-29 DIAGNOSIS — I10 Essential (primary) hypertension: Secondary | ICD-10-CM

## 2023-10-29 MED ORDER — METOPROLOL TARTRATE 100 MG PO TABS: ORAL_TABLET | ORAL | 0 refills | Status: DC

## 2023-10-29 NOTE — Patient Instructions (Addendum)
 Medication Instructions:  Your physician recommends that you continue on your current medications as directed. Please refer to the Current Medication list given to you today.   *If you need a refill on your cardiac medications before your next appointment, please call your pharmacy*  Lab Work: Your provider would like for you to have following labs drawn today BMP.   If you have labs (blood work) drawn today and your tests are completely normal, you will receive your results only by: MyChart Message (if you have MyChart) OR A paper copy in the mail If you have any lab test that is abnormal or we need to change your treatment, we will call you to review the results.  Testing/Procedures:   Your cardiac CT will be scheduled at one of the below locations:   [facility] - [place]  Please follow these instructions carefully (unless otherwise directed):  An IV will be required for this test and Nitroglycerin will be given.  Hold all erectile dysfunction medications at least 3 days (72 hrs) prior to test. (Ie viagra, cialis, sildenafil, tadalafil, etc)   On the Night Before the Test: Be sure to Drink plenty of water. Do not consume any caffeinated/decaffeinated beverages or chocolate 12 hours prior to your test. Do not take any antihistamines 12 hours prior to your test.  On the Day of the Test: Drink plenty of water until 1 hour prior to the test. Do not eat any food 1 hour prior to test. You may take your regular medications prior to the test.  Take metoprolol  (Lopressor ) two hours prior to test. If you take Furosemide/Hydrochlorothiazide/Spironolactone/Chlorthalidone, please HOLD on the morning of the test. Patients who wear a continuous glucose monitor MUST remove the device prior to scanning. FEMALES- please wear underwire-free bra if available, avoid dresses & tight clothing       After the Test: Drink plenty of water. After receiving IV contrast, you may experience a  mild flushed feeling. This is normal. On occasion, you may experience a mild rash up to 24 hours after the test. This is not dangerous. If this occurs, you can take Benadryl  25 mg, Zyrtec, Claritin , or Allegra and increase your fluid intake. (Patients taking Tikosyn should avoid Benadryl , and may take Zyrtec, Claritin , or Allegra) If you experience trouble breathing, this can be serious. If it is severe call 911 IMMEDIATELY. If it is mild, please call our office.  We will call to schedule your test 2-4 weeks out understanding that some insurance companies will need an authorization prior to the service being performed.   For more information and frequently asked questions, please visit our website : [email or web address]  For non-scheduling related questions, please contact the cardiac imaging nurse navigator should you have any questions/concerns: Cardiac Imaging Nurse Navigators Direct Office Dial: [phone]   For scheduling needs, including cancellations and rescheduling, please call [place], [phone].   Follow-Up: At [facility], you and your health needs are our priority.  As part of our continuing mission to provide you with exceptional heart care, our providers are all part of one team.  This team includes your primary Cardiologist (physician) and Advanced Practice Providers or APPs (Physician Assistants and Nurse Practitioners) who all work together to provide you with the care you need, when you need it.  Your next appointment:   1 month(s) post CTA  Provider:   You may see Deatrice Cage, MD or one of the following Advanced Practice Providers on your designated Care Team:  Lesley Maffucci, PA-C

## 2023-10-30 LAB — BASIC METABOLIC PANEL WITH GFR
BUN/Creatinine Ratio: 16 (ref 10–24)
BUN: 17 mg/dL (ref 8–27)
CO2: 23 mmol/L (ref 20–29)
Calcium: 10 mg/dL (ref 8.6–10.2)
Chloride: 99 mmol/L (ref 96–106)
Creatinine, Ser: 1.09 mg/dL (ref 0.76–1.27)
Glucose: 134 mg/dL — ABNORMAL HIGH (ref 70–99)
Potassium: 5 mmol/L (ref 3.5–5.2)
Sodium: 140 mmol/L (ref 134–144)
eGFR: 73 mL/min/1.73 (ref >59)

## 2023-11-03 ENCOUNTER — Ambulatory Visit: Payer: Self-pay | Admitting: Physician Assistant

## 2023-11-03 ENCOUNTER — Ambulatory Visit: Admitting: Physician Assistant

## 2023-11-03 ENCOUNTER — Ambulatory Visit: Admitting: Cardiovascular Disease

## 2023-11-06 ENCOUNTER — Encounter (HOSPITAL_COMMUNITY): Payer: Self-pay

## 2023-11-07 DIAGNOSIS — R55 Syncope and collapse: Secondary | ICD-10-CM

## 2023-11-10 ENCOUNTER — Ambulatory Visit (HOSPITAL_COMMUNITY)
Admission: RE | Admit: 2023-11-10 | Discharge: 2023-11-10 | Disposition: A | Discharge status: Home / Self Care | Source: Ambulatory Visit | Attending: Physician Assistant | Admitting: Physician Assistant

## 2023-11-10 DIAGNOSIS — I517 Cardiomegaly: Secondary | ICD-10-CM | POA: Insufficient documentation

## 2023-11-10 DIAGNOSIS — I700 Atherosclerosis of aorta: Secondary | ICD-10-CM | POA: Diagnosis not present

## 2023-11-10 DIAGNOSIS — R55 Syncope and collapse: Secondary | ICD-10-CM | POA: Insufficient documentation

## 2023-11-10 DIAGNOSIS — I2510 Atherosclerotic heart disease of native coronary artery without angina pectoris: Secondary | ICD-10-CM | POA: Insufficient documentation

## 2023-11-10 MED ORDER — NITROGLYCERIN 0.4 MG SL SUBL
0.8000 mg | SUBLINGUAL_TABLET | Freq: Once | SUBLINGUAL | Status: AC
  Administered 2023-11-10: 0.8 mg via SUBLINGUAL

## 2023-11-10 MED ORDER — IOHEXOL 350 MG/ML SOLN
100.0000 mL | Freq: Once | INTRAVENOUS | Status: AC | PRN
  Administered 2023-11-10: 100 mL via INTRAVENOUS

## 2023-11-11 ENCOUNTER — Ambulatory Visit (HOSPITAL_COMMUNITY)
Admission: RE | Admit: 2023-11-11 | Discharge: 2023-11-11 | Disposition: A | Discharge status: Home / Self Care | Source: Ambulatory Visit | Attending: Internal Medicine | Admitting: Internal Medicine

## 2023-11-11 ENCOUNTER — Other Ambulatory Visit: Payer: Self-pay | Admitting: Internal Medicine

## 2023-11-11 ENCOUNTER — Ambulatory Visit: Payer: Self-pay | Admitting: Internal Medicine

## 2023-11-11 DIAGNOSIS — R931 Abnormal findings on diagnostic imaging of heart and coronary circulation: Secondary | ICD-10-CM | POA: Insufficient documentation

## 2023-11-11 DIAGNOSIS — I2510 Atherosclerotic heart disease of native coronary artery without angina pectoris: Secondary | ICD-10-CM

## 2023-11-11 NOTE — Progress Notes (Signed)
CT sent for FFR 

## 2023-11-16 NOTE — Telephone Encounter (Signed)
 Called patient to clarify that he is NOT to take aspirin  - patient verbalized understanding and agreement with plan

## 2023-11-23 NOTE — Telephone Encounter (Signed)
Pt returning call for results. Please advise.  

## 2023-11-24 ENCOUNTER — Encounter: Payer: Self-pay | Admitting: Physician Assistant

## 2023-11-24 ENCOUNTER — Ambulatory Visit: Attending: Physician Assistant | Admitting: Physician Assistant

## 2023-11-24 VITALS — BP 140/72 | HR 73 | Ht 67.0 in | Wt 266.0 lb

## 2023-11-24 DIAGNOSIS — I10 Essential (primary) hypertension: Secondary | ICD-10-CM | POA: Diagnosis not present

## 2023-11-24 DIAGNOSIS — I2510 Atherosclerotic heart disease of native coronary artery without angina pectoris: Secondary | ICD-10-CM | POA: Diagnosis not present

## 2023-11-24 DIAGNOSIS — I482 Chronic atrial fibrillation, unspecified: Secondary | ICD-10-CM

## 2023-11-24 DIAGNOSIS — R931 Abnormal findings on diagnostic imaging of heart and coronary circulation: Secondary | ICD-10-CM

## 2023-11-24 DIAGNOSIS — E785 Hyperlipidemia, unspecified: Secondary | ICD-10-CM

## 2023-11-24 DIAGNOSIS — E1169 Type 2 diabetes mellitus with other specified complication: Secondary | ICD-10-CM

## 2023-11-24 DIAGNOSIS — G4730 Sleep apnea, unspecified: Secondary | ICD-10-CM

## 2023-11-24 DIAGNOSIS — R55 Syncope and collapse: Secondary | ICD-10-CM | POA: Diagnosis not present

## 2023-11-24 NOTE — Patient Instructions (Signed)
 Medication Instructions:  Your physician recommends that you continue on your current medications as directed. Please refer to the Current Medication list given to you today.  *If you need a refill on your cardiac medications before your next appointment, please call your pharmacy*  Lab Work: Your provider would like for you to have following labs drawn today CBC,BMET.   If you have labs (blood work) drawn today and your tests are completely normal, you will receive your results only by: MyChart Message (if you have MyChart) OR A paper copy in the mail If you have any lab test that is abnormal or we need to change your treatment, we will call you to review the results.  Testing/Procedures:  [place] [facility] [place]. [facility] [place] [address] [place] [address] Dept: [phone] Loc: [phone]  PERSEUS WESTALL  11/24/2023  You are scheduled for a Cardiac Catheterization on Friday, November 28 with Dr. Deatrice Cage.  1. Please arrive at the [facility] Entrance of [facility], [address], [place] [address] at 6:30 AM (This is 1 hour(s) prior to your procedure time).  Proceed to the Check-In Desk directly inside the entrance.  Procedure Parking: Use the entrance off of the [address] side of the hospital. Turn right upon entering and follow the driveway to parking that is directly in front of the Heart & Vascular Center. There is no valet parking available at this entrance, however there is an awning directly in front of the Heart & Vascular Center for drop off/ pick up for patients.  Special note: Every effort is made to have your procedure done on time. Please understand that emergencies sometimes delay scheduled procedures.  2. Diet: NPO: Nothing to eat OR drink after midnight. (For TEE and Cath the same day)   3. Hydration: You need to be well hydrated before your  procedure. On November 28, you may drink approved liquids (see below) until 2 hours before the procedure, with 16 oz of water as your last intake.   List of approved liquids water, clear juice, clear tea, black coffee, fruit juices, non-citric and without pulp, carbonated beverages, Gatorade, Kool -Aid, plain Jello-O and plain ice popsicles.   5. Medication instructions in preparation for your procedure:   Contrast Allergy: No   *For reference purposes while preparing patient instructions.   Delete this med list prior to printing instructions for patient.*  Stop taking Xarelto  (Rivaroxaban ) on Wednesday, November 26.  Hold Glipizide morning of procedure   Do not take Diabetes Med Glucophage (Metformin) on the day of the procedure and HOLD 48 HOURS AFTER THE PROCEDURE.  On the morning of your procedure, take your any morning medicines NOT listed above.  You may use sips of water.  6. Plan to go home the same day, you will only stay overnight if medically necessary. 7. Bring a current list of your medications and current insurance cards. 8. You MUST have a responsible person to drive you home. 9. Someone MUST be with you the first 24 hours after you arrive home or your discharge will be delayed. 10. Please wear clothes that are easy to get on and off and wear slip-on shoes.  Thank you for allowing us  to care for you!   -- [place] [facility]   Follow-Up: At [facility], you and your health needs are our priority.  As part of our  continuing mission to provide you with exceptional heart care, our providers are all part of one team.  This team includes your primary Cardiologist (physician) and Advanced Practice Providers or APPs (Physician Assistants and Nurse Practitioners) who all work together to provide you with the care you need, when you need it.  Your next appointment:    12/22/23   Provider:   Deatrice Cage, MD

## 2023-11-24 NOTE — Progress Notes (Signed)
 Cardiology Office Note    Date:  11/24/2023   ID:  Mason Houston, DOB 14-Sep-1950, MRN [id number]  PCP:  Rudolpho Norleen JONETTA, MD  Cardiologist:  Deatrice Cage, MD  Electrophysiologist:  None   Chief Complaint: Follow up  History of Present Illness:   Mason Houston is a 73 y.o. male with history of permanent atrial fibrillation, OSA on CPAP, CVA, non-Hodgkin's lymphoma s/p chemotherapy, hypertension, and obesity who presents for follow-up on recurrent syncope.     Patient was diagnosed with atrial fibrillation in 2013 after a routine physical revealed tachycardia with irregular heart rhythm and EKG confirming atrial fibrillation with RVR at 128 bpm.  He was largely asymptomatic but was directed to report to the ED for further evaluation.  He was started on diltiazem . CHA2DS2VASc was 1 and he was not started on anticoagulation initially. However, he had a stroke in 2015 and was subsequently started on Xarelto .  Most recent echo 08/2020 showed EF of 55 to 60%, mildly dilated left atrium, mild MR.  He has been rate controlled on diltiazem  and largely asymptomatic to his atrial fibrillation.  Patient was seen by myself for follow up 09/17/2023 and reported recurrent episodes of syncope since late June. The first episode was when he was working in his garden and suddenly lost consciousness and woke up laying on the ground. His son was nearby the driveway and when the patient came to he saw his son running towards him. He denies any prodromal symptoms. He did not hit his head. He does not recall the weather being especially hot at this time or being dehydrated, although he does note that he sweats quite profusely. He attributes this to being overweight. In the following weeks he reports having several episodes of lightheadedness when going from a sitting to standing position during which he feared he may pass out again. On 9/5 he was walking on a track near his house when he had another episode of  syncope. He reports that 3 other individuals were walking on the track and had just passed by him and when he came to these individuals were running towards him. He did not hit his head. Again during this episode he had no symptoms prior to the event. He denied any associated shortness of breath, lightheadedness, dizziness, or chest pain.  Carotid Dopplers 09/2023 with mild bilateral stenosis.  ZIO monitor 10/19/2023 showed 100% burden of atrial fibrillation with bundle branch/IVCD present.  Rare PVCs were noted.  Echo 10/26/2023 showed EF 60 to 65% with mild LVH and no significant valvular abnormalities.    Patient was most recently seen by myself 10/29/2023 and reported another episode of syncope which occurred on 10/7.  He reported he was leaf blowing his driveway which is not physically challenging for him.  He had an episode of lightheadedness and pause to regain his bearings then resumed leaf blowing and suddenly passed out shortly after.  This was not witnessed but he believes he was out for less than a minute.  He unfortunately was no longer wearing the ZIO monitor at the time.  He reported some mild lightheadedness after he came to but otherwise felt normal after about 15 minutes.  He reports falling hard and having back and shoulder pain after the fact.  He was not able to sleep in his bed due to discomfort from the fall for the following weeks.  He reported eating and drinking normally that day.  He continued to have intermittent episodes of  lightheadedness.  He has been fairly sedentary recently due to fear for recurrent syncope.  He was discouraged because prior to his first syncopal episode, he was exercising daily and eating healthy to lose weight in an effort to have his knee replaced.  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  This was [facility] which showed significant discrete stenosis of the mid to distal RCA.  Mid LAD stenosis was not considered significant by  FFR.  The LCx was not modeled, possibly due to small size, but is calcified proximately.  Definitive cardiac catheterization was recommended. CT over read showed new areas of nodular airspace disease in both lower lobes and lingula up to 13 mm.  Nonspecific and may be infectious or inflammatory.  Recommended follow-up in 1 to 3 months.  Patient presents today with symptoms overall improved since prior visit.  He reports that lightheadedness has become less frequent and he has had no further episodes of syncope since October 7.  He has been more active and eating healthier since feeling better and has lost 9 pounds since last visit with us .  He has mild intermittent ankle swelling which is stable from baseline.  He denies chest pain and shortness of breath.  Labs independently reviewed: 10/2023-BUN 17, creatinine 1.09, sodium 140, potassium 5.0 01/2023-Hgb 13.3, HCT 40, platelets 342, TC 104, TG 94, HDL 36, LDL 48   Objective   Past Medical History:  Diagnosis Date   Atherosclerosis    Atrial fibrillation (HCC)    Cancer (HCC)    Chronic bronchitis (HCC)    Collagen vascular disease    Depression    Diabetes mellitus without complication (HCC)    Dysrhythmia    Fibromyalgia    GERD (gastroesophageal reflux disease)    H/O Bell's palsy    History of German measles    Hypertension    Mild mitral regurgitation by prior echocardiogram    Mild tricuspid regurgitation by prior echocardiogram    Mini stroke    Morbid obesity with BMI of 40.0-44.9, adult (HCC)    Non Hodgkin's lymphoma (HCC) 2017   Osteoarthrosis, unspecified whether generalized or localized, lower leg    Sixth cranial nerve palsy    Sleep apnea    CPAP   Stroke (HCC)    Mini strokes. None since 2015 (went on Xarelto )   Vertigo    Wears dentures    full upper    Current Medications: Current Meds  Medication Sig   ACETAMINOPHEN  ER PO Take 1,000 mg by mouth daily.   Ascorbic Acid (VITAMIN C) 1000 MG tablet Take  1,000 mg by mouth daily.   b complex vitamins capsule Take 1 capsule by mouth daily.    cholecalciferol (VITAMIN D ) 1000 units tablet Take 1,000 Units by mouth daily.   diltiazem  (TIAZAC ) 360 MG 24 hr capsule Take 1 capsule by mouth once daily   glipiZIDE (GLUCOTROL) 10 MG tablet Take 10 mg by mouth daily before breakfast.   loratadine  (CLARITIN ) 10 MG tablet Take 10 mg by mouth daily as needed.    losartan  (COZAAR ) 50 MG tablet Take 2 tablets by mouth once daily   magnesium  oxide (MAG-OX) 400 (241.3 Mg) MG tablet Take 250 mg daily by mouth.    meclizine (ANTIVERT) 25 MG tablet Take 25 mg by mouth 3 (three) times daily as needed for dizziness.   metFORMIN (GLUCOPHAGE) 1000 MG tablet Take 1,000 mg by mouth 2 (two) times daily.  metoprolol  tartrate (LOPRESSOR ) 100 MG tablet TAKE 1 TABLET 2 HR PRIOR TO CARDIAC PROCEDURE   omeprazole  (PRILOSEC) 20 MG capsule Take 1 capsule by mouth once daily   OZEMPIC, 0.25 OR 0.5 MG/DOSE, 2 MG/3ML SOPN SMARTSIG:0.25 Milligram(s) SUB-Q Once a Week   rivaroxaban  (XARELTO ) 20 MG TABS tablet TAKE 1 TABLET BY MOUTH ONCE DAILY WITH SUPPER   rosuvastatin  (CRESTOR ) 10 MG tablet TAKE 1 TABLET(10 MG) BY MOUTH DAILY    Allergies:   Celebrex [celecoxib], Other, and Penicillins   Social History   Socioeconomic History   Marital status: Married    Spouse name: Not on file   Number of children: Not on file   Years of education: Not on file   Highest education level: Not on file  Occupational History   Not on file  Tobacco Use   Smoking status: Former    Current packs/day: 0.00    Average packs/day: 4.5 packs/day for 15.0 years (67.5 ttl pk-yrs)    Types: Cigarettes    Start date: 01/06/1966    Quit date: 01/06/1981    Years since quitting: 42.9   Smokeless tobacco: Never  Vaping Use   Vaping status: Never Used  Substance and Sexual Activity   Alcohol use: No   Drug use: No    Types: Marijuana    Comment: PAST   Sexual activity: Not on file  Other Topics  Concern   Not on file  Social History Narrative   ** Merged History Encounter **       Social Drivers of Health   Financial Resource Strain: Medium Risk (11/12/2023)   Received from [facility]   Overall Financial Resource Strain (CARDIA)    Difficulty of Paying Living Expenses: Somewhat hard  Food Insecurity: Food Insecurity Present (11/12/2023)   Received from [facility]   Hunger Vital Sign    Within the past 12 months, you worried that your food would run out before you got the money to buy more.: Sometimes true    Within the past 12 months, the food you bought just didn't last and you didn't have money to get more.: Never true  Transportation Needs: No Transportation Needs (11/12/2023)   Received from [facility] - Transportation    In the past 12 months, has lack of transportation kept you from medical appointments or from getting medications?: No    Lack of Transportation (Non-Medical): No  Physical Activity: Not on file  Stress: Not on file  Social Connections: Not on file     Family History:  The patient's family history includes Diabetes in his paternal grandfather and paternal grandmother; Heart attack in his father; Hypertension in his mother; Stroke in his father.  ROS:   12-point review of systems is negative unless otherwise noted in the HPI.  EKGs/Other Studies Reviewed:    Studies reviewed were summarized above. The additional studies were reviewed today:  11/10/2023 Coronary CTA 1. Moderate to severe mixed CAD of the RCA and LAD, CADRADS = 4. CT FFR will be performed and reported separately. 2. Normal coronary origin with right dominance. 3. Coronary artery calcium  score is 1835, which places the patient in the 92nd percentile for age/race and sex-matched controls (MESA). 4. Dilated main pulmonary artery, suggestive of possible pulmonary hypertension. 5. Aortic atherosclerosis and mild aortic  valve leaflet calcification. 6. Recommend definitive cardiac catheterization.  1. CT FFR analysis does show significant discrete stenosis (<0.75) of the mid to  distal RCA. 2.  The mid-LAD stenosis is not considered significant by FFR. 3. The LCX was not modeled, possibly due to small size, but is calcified proximally. 4.  Definitive cardiac catheterization is recommended.  10/26/2023 Echo complete 1. Left ventricular ejection fraction, by estimation, is 60 to 65%. Left  ventricular ejection fraction by 2D MOD biplane is 65.6 %. The left  ventricle has normal function. The left ventricle has no regional wall  motion abnormalities. There is mild left  ventricular hypertrophy. Left ventricular diastolic parameters are  indeterminate.   2. Right ventricular systolic function is normal. The right ventricular  size is normal.   3. The mitral valve is normal in structure. No evidence of mitral valve  regurgitation.   4. The aortic valve is tricuspid. Aortic valve regurgitation is not  visualized.   5. The inferior vena cava is normal in size with greater than 50%  respiratory variability, suggesting right atrial pressure of 3 mmHg.    10/19/2023 Live telemetry monitoring Atrial Fibrillation occurred continuously (100% burden), ranging from 42-192 bpm (avg of 73 bpm). Bundle Branch Block/IVCD was present. Isolated VEs were rare (<1.0%), VE Couplets were rare (<1.0%), and no VE Triplets were present.   09/2023 Carotid duplex Right Carotid: Velocities in the right ICA are consistent with a 1-39% stenosis.  Left Carotid: Velocities in the left ICA are consistent with a 1-39% stenosis.  Vertebrals: Bilateral vertebral arteries demonstrate antegrade flow.  Subclavians: Normal flow hemodynamics were seen in bilateral subclavian arteries.  EKG:  EKG personally reviewed by me today EKG Interpretation Date/Time:  Tuesday November 24 2023 11:37:00 EST Ventricular Rate:  73 PR Interval:    QRS  Duration:  142 QT Interval:  426 QTC Calculation: 469 R Axis:   -63  Text Interpretation: Atrial fibrillation Right bundle branch block When compared with ECG of 29-Oct-2023 14:47, No significant change was found Confirmed by Lorene Sinclair ([id number]) on 11/24/2023 11:40:07 AM  PHYSICAL EXAM:    VS:  BP (!) 140/72 (BP Location: Left Arm, Patient Position: Sitting, Cuff Size: Normal)   Pulse 73   Ht 5' 7 (1.702 m)   Wt 266 lb (120.7 kg)   SpO2 95%   BMI 41.66 kg/m   BMI: Body mass index is 41.66 kg/m.  GEN: Well nourished, well developed in no acute distress NECK: No JVD; No carotid bruits CARDIAC: IRIR, no murmurs, rubs, gallops RESPIRATORY:  Clear to auscultation without rales, wheezing or rhonchi  ABDOMEN: Soft, non-tender, non-distended EXTREMITIES: No edema; No deformity  Wt Readings from Last 3 Encounters:  11/24/23 266 lb (120.7 kg)  10/29/23 275 lb 3.2 oz (124.8 kg)  09/17/23 266 lb (120.7 kg)                  ASSESSMENT & PLAN:   Recurrent syncope - 3 episodes of syncope since late June with the most recent occurring on 10/7.  All episodes have been during exertion and without prodromal symptoms.  He does have intermittent lightheadedness which is overall improved since last visit.  Orthostatics have been negative.  Echo 10/20 with normal LV systolic function and no significant valvular abnormalities.  Carotid Dopplers showed mild stenosis bilaterally.  ZIO monitor showed permanent atrial fibrillation without evidence of sustained arrhythmia or pauses, although he had no episodes of syncope during the 2 weeks of monitoring.  Coronary CTA revealed moderate to severe mixed CAD, see below.  Concern for ventricular arrhythmia causing recurrent syncope.  He has appointment with EP  12/9 for possible loop implantation.  Strict ED precautions discussed with patient in addition to abstaining from driving per [place]  [employer].  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  FFR analysis showed significant discrete stenosis of the mid to distal RCA.  The mid LAD was not considered significant and LCx was not modeled, possibly due to small size, but is calcified proximally.  He is continued on rosuvastatin  10 mg daily with low threshold to transition to high intensity statin pending cath.  He is on long-term DOAC in place of aspirin .  Recommend proceeding with left heart catheterization.  Check CBC and BMP today.  Chronic atrial fibrillation - Longstanding history of asymptomatic atrial fibrillation.  Remains in rate controlled A-fib today on EKG.  He is continued on diltiazem  360 mg daily and Xarelto  20 mg daily for CHA2DS2-VASc of 5.  Abnormal CT findings - CT overread for coronary CTA revealed new areas of nodular airspace in both lower lobes and lingula, up to 13 mm.  Radiology felt these were nonspecific and could represent infection or inflammatory process.  Patient denies pulmonary symptoms and is afebrile.  Can discuss repeat CT at follow-up which was recommended in 1 to 3 months.  Essential hypertension - BP reasonably well-controlled on current regimen.  Hyperlipidemia - Most recent lipid panel 01/2023 with LDL 48, at goal.  He is continued on rosuvastatin  as above.  Sleep apnea - Compliant with CPAP  Obesity - Working towards weight loss although has been difficult due to recurrent syncope.  He is focusing on healthy eating habits.  T2DM - Most recent A1c 8.5.   Informed Consent   Shared Decision Making/Informed Consent The risks [stroke (1 in 1000), death (1 in 1000), kidney failure [usually temporary] (1 in 500), bleeding (1 in 200), allergic reaction [possibly serious] (1 in 200)], benefits (diagnostic support and management of coronary artery disease) and alternatives of a cardiac catheterization were discussed in detail with Mr.  Schinke and he is willing to proceed.      Disposition: Check CBC and BMP.  Proceed with cardiac catheterization.  F/u with Dr. Darron as scheduled.   Medication Adjustments/Labs and Tests Ordered: Current medicines are reviewed at length with the patient today.  Concerns regarding medicines are outlined above. Medication changes, Labs and Tests ordered today are summarized above and listed in the Patient Instructions accessible in Encounters.   Bonney Lesley Maffucci, PA-C 11/24/2023 1:02 PM      [facility] [address] [place], [place] [address] [phone]

## 2023-11-24 NOTE — H&P (View-Only) (Signed)
 Cardiology Office Note    Date:  11/24/2023   ID:  STEDMAN SUMMERVILLE, DOB 14-Sep-1950, MRN [id number]  PCP:  Rudolpho Norleen JONETTA, MD  Cardiologist:  Deatrice Cage, MD  Electrophysiologist:  None   Chief Complaint: Follow up  History of Present Illness:   Mason Houston is a 73 y.o. male with history of permanent atrial fibrillation, OSA on CPAP, CVA, non-Hodgkin's lymphoma s/p chemotherapy, hypertension, and obesity who presents for follow-up on recurrent syncope.     Patient was diagnosed with atrial fibrillation in 2013 after a routine physical revealed tachycardia with irregular heart rhythm and EKG confirming atrial fibrillation with RVR at 128 bpm.  He was largely asymptomatic but was directed to report to the ED for further evaluation.  He was started on diltiazem . CHA2DS2VASc was 1 and he was not started on anticoagulation initially. However, he had a stroke in 2015 and was subsequently started on Xarelto .  Most recent echo 08/2020 showed EF of 55 to 60%, mildly dilated left atrium, mild MR.  He has been rate controlled on diltiazem  and largely asymptomatic to his atrial fibrillation.  Patient was seen by myself for follow up 09/17/2023 and reported recurrent episodes of syncope since late June. The first episode was when he was working in his garden and suddenly lost consciousness and woke up laying on the ground. His son was nearby the driveway and when the patient came to he saw his son running towards him. He denies any prodromal symptoms. He did not hit his head. He does not recall the weather being especially hot at this time or being dehydrated, although he does note that he sweats quite profusely. He attributes this to being overweight. In the following weeks he reports having several episodes of lightheadedness when going from a sitting to standing position during which he feared he may pass out again. On 9/5 he was walking on a track near his house when he had another episode of  syncope. He reports that 3 other individuals were walking on the track and had just passed by him and when he came to these individuals were running towards him. He did not hit his head. Again during this episode he had no symptoms prior to the event. He denied any associated shortness of breath, lightheadedness, dizziness, or chest pain.  Carotid Dopplers 09/2023 with mild bilateral stenosis.  ZIO monitor 10/19/2023 showed 100% burden of atrial fibrillation with bundle branch/IVCD present.  Rare PVCs were noted.  Echo 10/26/2023 showed EF 60 to 65% with mild LVH and no significant valvular abnormalities.    Patient was most recently seen by myself 10/29/2023 and reported another episode of syncope which occurred on 10/7.  He reported he was leaf blowing his driveway which is not physically challenging for him.  He had an episode of lightheadedness and pause to regain his bearings then resumed leaf blowing and suddenly passed out shortly after.  This was not witnessed but he believes he was out for less than a minute.  He unfortunately was no longer wearing the ZIO monitor at the time.  He reported some mild lightheadedness after he came to but otherwise felt normal after about 15 minutes.  He reports falling hard and having back and shoulder pain after the fact.  He was not able to sleep in his bed due to discomfort from the fall for the following weeks.  He reported eating and drinking normally that day.  He continued to have intermittent episodes of  lightheadedness.  He has been fairly sedentary recently due to fear for recurrent syncope.  He was discouraged because prior to his first syncopal episode, he was exercising daily and eating healthy to lose weight in an effort to have his knee replaced.  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  This was [facility] which showed significant discrete stenosis of the mid to distal RCA.  Mid LAD stenosis was not considered significant by  FFR.  The LCx was not modeled, possibly due to small size, but is calcified proximately.  Definitive cardiac catheterization was recommended. CT over read showed new areas of nodular airspace disease in both lower lobes and lingula up to 13 mm.  Nonspecific and may be infectious or inflammatory.  Recommended follow-up in 1 to 3 months.  Patient presents today with symptoms overall improved since prior visit.  He reports that lightheadedness has become less frequent and he has had no further episodes of syncope since October 7.  He has been more active and eating healthier since feeling better and has lost 9 pounds since last visit with us .  He has mild intermittent ankle swelling which is stable from baseline.  He denies chest pain and shortness of breath.  Labs independently reviewed: 10/2023-BUN 17, creatinine 1.09, sodium 140, potassium 5.0 01/2023-Hgb 13.3, HCT 40, platelets 342, TC 104, TG 94, HDL 36, LDL 48   Objective   Past Medical History:  Diagnosis Date   Atherosclerosis    Atrial fibrillation (HCC)    Cancer (HCC)    Chronic bronchitis (HCC)    Collagen vascular disease    Depression    Diabetes mellitus without complication (HCC)    Dysrhythmia    Fibromyalgia    GERD (gastroesophageal reflux disease)    H/O Bell's palsy    History of German measles    Hypertension    Mild mitral regurgitation by prior echocardiogram    Mild tricuspid regurgitation by prior echocardiogram    Mini stroke    Morbid obesity with BMI of 40.0-44.9, adult (HCC)    Non Hodgkin's lymphoma (HCC) 2017   Osteoarthrosis, unspecified whether generalized or localized, lower leg    Sixth cranial nerve palsy    Sleep apnea    CPAP   Stroke (HCC)    Mini strokes. None since 2015 (went on Xarelto )   Vertigo    Wears dentures    full upper    Current Medications: Current Meds  Medication Sig   ACETAMINOPHEN  ER PO Take 1,000 mg by mouth daily.   Ascorbic Acid (VITAMIN C) 1000 MG tablet Take  1,000 mg by mouth daily.   b complex vitamins capsule Take 1 capsule by mouth daily.    cholecalciferol (VITAMIN D ) 1000 units tablet Take 1,000 Units by mouth daily.   diltiazem  (TIAZAC ) 360 MG 24 hr capsule Take 1 capsule by mouth once daily   glipiZIDE (GLUCOTROL) 10 MG tablet Take 10 mg by mouth daily before breakfast.   loratadine  (CLARITIN ) 10 MG tablet Take 10 mg by mouth daily as needed.    losartan  (COZAAR ) 50 MG tablet Take 2 tablets by mouth once daily   magnesium  oxide (MAG-OX) 400 (241.3 Mg) MG tablet Take 250 mg daily by mouth.    meclizine (ANTIVERT) 25 MG tablet Take 25 mg by mouth 3 (three) times daily as needed for dizziness.   metFORMIN (GLUCOPHAGE) 1000 MG tablet Take 1,000 mg by mouth 2 (two) times daily.  metoprolol  tartrate (LOPRESSOR ) 100 MG tablet TAKE 1 TABLET 2 HR PRIOR TO CARDIAC PROCEDURE   omeprazole  (PRILOSEC) 20 MG capsule Take 1 capsule by mouth once daily   OZEMPIC, 0.25 OR 0.5 MG/DOSE, 2 MG/3ML SOPN SMARTSIG:0.25 Milligram(s) SUB-Q Once a Week   rivaroxaban  (XARELTO ) 20 MG TABS tablet TAKE 1 TABLET BY MOUTH ONCE DAILY WITH SUPPER   rosuvastatin  (CRESTOR ) 10 MG tablet TAKE 1 TABLET(10 MG) BY MOUTH DAILY    Allergies:   Celebrex [celecoxib], Other, and Penicillins   Social History   Socioeconomic History   Marital status: Married    Spouse name: Not on file   Number of children: Not on file   Years of education: Not on file   Highest education level: Not on file  Occupational History   Not on file  Tobacco Use   Smoking status: Former    Current packs/day: 0.00    Average packs/day: 4.5 packs/day for 15.0 years (67.5 ttl pk-yrs)    Types: Cigarettes    Start date: 01/06/1966    Quit date: 01/06/1981    Years since quitting: 42.9   Smokeless tobacco: Never  Vaping Use   Vaping status: Never Used  Substance and Sexual Activity   Alcohol use: No   Drug use: No    Types: Marijuana    Comment: PAST   Sexual activity: Not on file  Other Topics  Concern   Not on file  Social History Narrative   ** Merged History Encounter **       Social Drivers of Health   Financial Resource Strain: Medium Risk (11/12/2023)   Received from [facility]   Overall Financial Resource Strain (CARDIA)    Difficulty of Paying Living Expenses: Somewhat hard  Food Insecurity: Food Insecurity Present (11/12/2023)   Received from [facility]   Hunger Vital Sign    Within the past 12 months, you worried that your food would run out before you got the money to buy more.: Sometimes true    Within the past 12 months, the food you bought just didn't last and you didn't have money to get more.: Never true  Transportation Needs: No Transportation Needs (11/12/2023)   Received from [facility] - Transportation    In the past 12 months, has lack of transportation kept you from medical appointments or from getting medications?: No    Lack of Transportation (Non-Medical): No  Physical Activity: Not on file  Stress: Not on file  Social Connections: Not on file     Family History:  The patient's family history includes Diabetes in his paternal grandfather and paternal grandmother; Heart attack in his father; Hypertension in his mother; Stroke in his father.  ROS:   12-point review of systems is negative unless otherwise noted in the HPI.  EKGs/Other Studies Reviewed:    Studies reviewed were summarized above. The additional studies were reviewed today:  11/10/2023 Coronary CTA 1. Moderate to severe mixed CAD of the RCA and LAD, CADRADS = 4. CT FFR will be performed and reported separately. 2. Normal coronary origin with right dominance. 3. Coronary artery calcium  score is 1835, which places the patient in the 92nd percentile for age/race and sex-matched controls (MESA). 4. Dilated main pulmonary artery, suggestive of possible pulmonary hypertension. 5. Aortic atherosclerosis and mild aortic  valve leaflet calcification. 6. Recommend definitive cardiac catheterization.  1. CT FFR analysis does show significant discrete stenosis (<0.75) of the mid to  distal RCA. 2.  The mid-LAD stenosis is not considered significant by FFR. 3. The LCX was not modeled, possibly due to small size, but is calcified proximally. 4.  Definitive cardiac catheterization is recommended.  10/26/2023 Echo complete 1. Left ventricular ejection fraction, by estimation, is 60 to 65%. Left  ventricular ejection fraction by 2D MOD biplane is 65.6 %. The left  ventricle has normal function. The left ventricle has no regional wall  motion abnormalities. There is mild left  ventricular hypertrophy. Left ventricular diastolic parameters are  indeterminate.   2. Right ventricular systolic function is normal. The right ventricular  size is normal.   3. The mitral valve is normal in structure. No evidence of mitral valve  regurgitation.   4. The aortic valve is tricuspid. Aortic valve regurgitation is not  visualized.   5. The inferior vena cava is normal in size with greater than 50%  respiratory variability, suggesting right atrial pressure of 3 mmHg.    10/19/2023 Live telemetry monitoring Atrial Fibrillation occurred continuously (100% burden), ranging from 42-192 bpm (avg of 73 bpm). Bundle Branch Block/IVCD was present. Isolated VEs were rare (<1.0%), VE Couplets were rare (<1.0%), and no VE Triplets were present.   09/2023 Carotid duplex Right Carotid: Velocities in the right ICA are consistent with a 1-39% stenosis.  Left Carotid: Velocities in the left ICA are consistent with a 1-39% stenosis.  Vertebrals: Bilateral vertebral arteries demonstrate antegrade flow.  Subclavians: Normal flow hemodynamics were seen in bilateral subclavian arteries.  EKG:  EKG personally reviewed by me today EKG Interpretation Date/Time:  Tuesday November 24 2023 11:37:00 EST Ventricular Rate:  73 PR Interval:    QRS  Duration:  142 QT Interval:  426 QTC Calculation: 469 R Axis:   -63  Text Interpretation: Atrial fibrillation Right bundle branch block When compared with ECG of 29-Oct-2023 14:47, No significant change was found Confirmed by Lorene Sinclair ([id number]) on 11/24/2023 11:40:07 AM  PHYSICAL EXAM:    VS:  BP (!) 140/72 (BP Location: Left Arm, Patient Position: Sitting, Cuff Size: Normal)   Pulse 73   Ht 5' 7 (1.702 m)   Wt 266 lb (120.7 kg)   SpO2 95%   BMI 41.66 kg/m   BMI: Body mass index is 41.66 kg/m.  GEN: Well nourished, well developed in no acute distress NECK: No JVD; No carotid bruits CARDIAC: IRIR, no murmurs, rubs, gallops RESPIRATORY:  Clear to auscultation without rales, wheezing or rhonchi  ABDOMEN: Soft, non-tender, non-distended EXTREMITIES: No edema; No deformity  Wt Readings from Last 3 Encounters:  11/24/23 266 lb (120.7 kg)  10/29/23 275 lb 3.2 oz (124.8 kg)  09/17/23 266 lb (120.7 kg)                  ASSESSMENT & PLAN:   Recurrent syncope - 3 episodes of syncope since late June with the most recent occurring on 10/7.  All episodes have been during exertion and without prodromal symptoms.  He does have intermittent lightheadedness which is overall improved since last visit.  Orthostatics have been negative.  Echo 10/20 with normal LV systolic function and no significant valvular abnormalities.  Carotid Dopplers showed mild stenosis bilaterally.  ZIO monitor showed permanent atrial fibrillation without evidence of sustained arrhythmia or pauses, although he had no episodes of syncope during the 2 weeks of monitoring.  Coronary CTA revealed moderate to severe mixed CAD, see below.  Concern for ventricular arrhythmia causing recurrent syncope.  He has appointment with EP  12/9 for possible loop implantation.  Strict ED precautions discussed with patient in addition to abstaining from driving per [place]  [employer].  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  FFR analysis showed significant discrete stenosis of the mid to distal RCA.  The mid LAD was not considered significant and LCx was not modeled, possibly due to small size, but is calcified proximally.  He is continued on rosuvastatin  10 mg daily with low threshold to transition to high intensity statin pending cath.  He is on long-term DOAC in place of aspirin .  Recommend proceeding with left heart catheterization.  Check CBC and BMP today.  Chronic atrial fibrillation - Longstanding history of asymptomatic atrial fibrillation.  Remains in rate controlled A-fib today on EKG.  He is continued on diltiazem  360 mg daily and Xarelto  20 mg daily for CHA2DS2-VASc of 5.  Abnormal CT findings - CT overread for coronary CTA revealed new areas of nodular airspace in both lower lobes and lingula, up to 13 mm.  Radiology felt these were nonspecific and could represent infection or inflammatory process.  Patient denies pulmonary symptoms and is afebrile.  Can discuss repeat CT at follow-up which was recommended in 1 to 3 months.  Essential hypertension - BP reasonably well-controlled on current regimen.  Hyperlipidemia - Most recent lipid panel 01/2023 with LDL 48, at goal.  He is continued on rosuvastatin  as above.  Sleep apnea - Compliant with CPAP  Obesity - Working towards weight loss although has been difficult due to recurrent syncope.  He is focusing on healthy eating habits.  T2DM - Most recent A1c 8.5.   Informed Consent   Shared Decision Making/Informed Consent The risks [stroke (1 in 1000), death (1 in 1000), kidney failure [usually temporary] (1 in 500), bleeding (1 in 200), allergic reaction [possibly serious] (1 in 200)], benefits (diagnostic support and management of coronary artery disease) and alternatives of a cardiac catheterization were discussed in detail with Mr.  Schinke and he is willing to proceed.      Disposition: Check CBC and BMP.  Proceed with cardiac catheterization.  F/u with Dr. Darron as scheduled.   Medication Adjustments/Labs and Tests Ordered: Current medicines are reviewed at length with the patient today.  Concerns regarding medicines are outlined above. Medication changes, Labs and Tests ordered today are summarized above and listed in the Patient Instructions accessible in Encounters.   Bonney Lesley Maffucci, PA-C 11/24/2023 1:02 PM      [facility] [address] [place], [place] [address] [phone]

## 2023-11-25 LAB — CBC
Hematocrit: 44 % (ref 37.5–51.0)
Hemoglobin: 14 g/dL (ref 13.0–17.7)
MCH: 28.2 pg (ref 26.6–33.0)
MCHC: 31.8 g/dL (ref 31.5–35.7)
MCV: 89 fL (ref 79–97)
Platelets: 371 x10E3/uL (ref 150–450)
RBC: 4.97 x10E6/uL (ref 4.14–5.80)
RDW: 13.6 % (ref 11.6–15.4)
WBC: 6.8 x10E3/uL (ref 3.4–10.8)

## 2023-11-25 LAB — BASIC METABOLIC PANEL WITH GFR
BUN/Creatinine Ratio: 13 (ref 10–24)
BUN: 17 mg/dL (ref 8–27)
CO2: 22 mmol/L (ref 20–29)
Calcium: 10.4 mg/dL — ABNORMAL HIGH (ref 8.6–10.2)
Chloride: 101 mmol/L (ref 96–106)
Creatinine, Ser: 1.28 mg/dL — ABNORMAL HIGH (ref 0.76–1.27)
Glucose: 166 mg/dL — ABNORMAL HIGH (ref 70–99)
Potassium: 5.5 mmol/L — ABNORMAL HIGH (ref 3.5–5.2)
Sodium: 141 mmol/L (ref 134–144)
eGFR: 60 mL/min/1.73 (ref >59)

## 2023-11-26 ENCOUNTER — Ambulatory Visit: Payer: Self-pay | Admitting: Physician Assistant

## 2023-12-04 ENCOUNTER — Other Ambulatory Visit: Payer: Self-pay

## 2023-12-04 ENCOUNTER — Encounter: Payer: Self-pay | Admitting: Cardiovascular Disease

## 2023-12-04 ENCOUNTER — Ambulatory Visit
Admission: RE | Admit: 2023-12-04 | Discharge: 2023-12-04 | Disposition: A | Discharge status: Home / Self Care | Attending: Cardiovascular Disease | Admitting: Cardiovascular Disease

## 2023-12-04 ENCOUNTER — Encounter
Admission: RE | Disposition: A | Discharge status: Home / Self Care | Payer: Self-pay | Source: Home / Self Care | Attending: Cardiovascular Disease

## 2023-12-04 DIAGNOSIS — E119 Type 2 diabetes mellitus without complications: Secondary | ICD-10-CM | POA: Insufficient documentation

## 2023-12-04 DIAGNOSIS — I2510 Atherosclerotic heart disease of native coronary artery without angina pectoris: Secondary | ICD-10-CM

## 2023-12-04 DIAGNOSIS — Z9221 Personal history of antineoplastic chemotherapy: Secondary | ICD-10-CM | POA: Diagnosis not present

## 2023-12-04 DIAGNOSIS — I6529 Occlusion and stenosis of unspecified carotid artery: Secondary | ICD-10-CM | POA: Insufficient documentation

## 2023-12-04 DIAGNOSIS — Z8673 Personal history of transient ischemic attack (TIA), and cerebral infarction without residual deficits: Secondary | ICD-10-CM | POA: Insufficient documentation

## 2023-12-04 DIAGNOSIS — I4821 Permanent atrial fibrillation: Secondary | ICD-10-CM | POA: Insufficient documentation

## 2023-12-04 DIAGNOSIS — G4733 Obstructive sleep apnea (adult) (pediatric): Secondary | ICD-10-CM | POA: Diagnosis not present

## 2023-12-04 DIAGNOSIS — Z7984 Long term (current) use of oral hypoglycemic drugs: Secondary | ICD-10-CM | POA: Insufficient documentation

## 2023-12-04 DIAGNOSIS — Z7985 Long-term (current) use of injectable non-insulin antidiabetic drugs: Secondary | ICD-10-CM | POA: Insufficient documentation

## 2023-12-04 DIAGNOSIS — E785 Hyperlipidemia, unspecified: Secondary | ICD-10-CM | POA: Insufficient documentation

## 2023-12-04 DIAGNOSIS — Z6841 Body Mass Index (BMI) 40.0 and over, adult: Secondary | ICD-10-CM | POA: Diagnosis not present

## 2023-12-04 DIAGNOSIS — Z79899 Other long term (current) drug therapy: Secondary | ICD-10-CM | POA: Diagnosis not present

## 2023-12-04 DIAGNOSIS — Z87891 Personal history of nicotine dependence: Secondary | ICD-10-CM | POA: Diagnosis not present

## 2023-12-04 DIAGNOSIS — R931 Abnormal findings on diagnostic imaging of heart and coronary circulation: Secondary | ICD-10-CM | POA: Diagnosis present

## 2023-12-04 DIAGNOSIS — I10 Essential (primary) hypertension: Secondary | ICD-10-CM | POA: Insufficient documentation

## 2023-12-04 DIAGNOSIS — Z8572 Personal history of non-Hodgkin lymphomas: Secondary | ICD-10-CM | POA: Insufficient documentation

## 2023-12-04 DIAGNOSIS — Z7901 Long term (current) use of anticoagulants: Secondary | ICD-10-CM | POA: Diagnosis not present

## 2023-12-04 HISTORY — PX: CORONARY PRESSURE/FFR STUDY: CATH118243

## 2023-12-04 HISTORY — PX: LEFT HEART CATH AND CORONARY ANGIOGRAPHY: CATH118249

## 2023-12-04 LAB — GLUCOSE, CAPILLARY: Glucose-Capillary: 122 mg/dL — ABNORMAL HIGH (ref 70–99)

## 2023-12-04 SURGERY — LEFT HEART CATH AND CORONARY ANGIOGRAPHY
Anesthesia: Moderate Sedation

## 2023-12-04 MED ORDER — NITROGLYCERIN 1 MG/10 ML FOR IR/CATH LAB
INTRA_ARTERIAL | Status: AC
Start: 2023-12-04 — End: 2023-12-04
  Filled 2023-12-04: qty 10

## 2023-12-04 MED ORDER — SODIUM CHLORIDE 0.9% FLUSH: 3.0000 mL | INTRAVENOUS | Status: DC | PRN

## 2023-12-04 MED ORDER — SODIUM CHLORIDE 0.9% FLUSH: 3.0000 mL | Freq: Two times a day (BID) | INTRAVENOUS | Status: DC

## 2023-12-04 MED ORDER — ONDANSETRON HCL 4 MG/2ML IJ SOLN
4.0000 mg | Freq: Four times a day (QID) | INTRAMUSCULAR | Status: DC | PRN
Start: 2023-12-04 — End: 2023-12-04

## 2023-12-04 MED ORDER — VERAPAMIL HCL 2.5 MG/ML IV SOLN
INTRAVENOUS | Status: AC
  Filled 2023-12-04: qty 2

## 2023-12-04 MED ORDER — FREE WATER
500.0000 mL | Freq: Once | Status: AC
  Administered 2023-12-04: 500 mL via ORAL

## 2023-12-04 MED ORDER — LIDOCAINE HCL (PF) 1 % IJ SOLN
INTRAMUSCULAR | Status: DC | PRN
  Administered 2023-12-04: 2 mL

## 2023-12-04 MED ORDER — FENTANYL CITRATE (PF) 100 MCG/2ML IJ SOLN
INTRAMUSCULAR | Status: AC
  Filled 2023-12-04: qty 2

## 2023-12-04 MED ORDER — NITROGLYCERIN 1 MG/10 ML FOR IR/CATH LAB
INTRA_ARTERIAL | Status: DC | PRN
  Administered 2023-12-04 (×2): 200 ug via INTRACORONARY

## 2023-12-04 MED ORDER — SODIUM CHLORIDE 0.9 % IV SOLN: 250.0000 mL | INTRAVENOUS | Status: DC | PRN

## 2023-12-04 MED ORDER — VERAPAMIL HCL 2.5 MG/ML IV SOLN
INTRAVENOUS | Status: DC | PRN
Start: 2023-12-04 — End: 2023-12-04
  Administered 2023-12-04: 2.5 mg via INTRA_ARTERIAL

## 2023-12-04 MED ORDER — FENTANYL CITRATE (PF) 100 MCG/2ML IJ SOLN
INTRAMUSCULAR | Status: DC | PRN
  Administered 2023-12-04: 25 ug via INTRAVENOUS

## 2023-12-04 MED ORDER — MIDAZOLAM HCL 2 MG/2ML IJ SOLN
INTRAMUSCULAR | Status: AC
  Filled 2023-12-04: qty 2

## 2023-12-04 MED ORDER — ASPIRIN 81 MG PO CHEW
CHEWABLE_TABLET | ORAL | Status: AC
Start: 2023-12-04 — End: 2023-12-04
  Filled 2023-12-04: qty 1

## 2023-12-04 MED ORDER — HEPARIN SODIUM (PORCINE) 1000 UNIT/ML IJ SOLN
INTRAMUSCULAR | Status: DC | PRN
  Administered 2023-12-04: 5000 [IU] via INTRAVENOUS
  Administered 2023-12-04: 6000 [IU] via INTRAVENOUS

## 2023-12-04 MED ORDER — ASPIRIN 81 MG PO CHEW
81.0000 mg | CHEWABLE_TABLET | ORAL | Status: AC
  Administered 2023-12-04: 81 mg via ORAL

## 2023-12-04 MED ORDER — HEPARIN (PORCINE) IN NACL 1000-0.9 UT/500ML-% IV SOLN
INTRAVENOUS | Status: AC
  Filled 2023-12-04: qty 1000

## 2023-12-04 MED ORDER — ACETAMINOPHEN 325 MG PO TABS
650.0000 mg | ORAL_TABLET | ORAL | Status: DC | PRN
Start: 2023-12-04 — End: 2023-12-04

## 2023-12-04 MED ORDER — IOHEXOL 300 MG/ML  SOLN
INTRAMUSCULAR | Status: DC | PRN
  Administered 2023-12-04: 90 mL

## 2023-12-04 MED ORDER — MIDAZOLAM HCL (PF) 2 MG/2ML IJ SOLN
INTRAMUSCULAR | Status: DC | PRN
  Administered 2023-12-04: 1 mg via INTRAVENOUS

## 2023-12-04 MED ORDER — HEPARIN (PORCINE) IN NACL 1000-0.9 UT/500ML-% IV SOLN
INTRAVENOUS | Status: DC | PRN
  Administered 2023-12-04: 1000 mL

## 2023-12-04 MED ORDER — SODIUM CHLORIDE 0.9 % IV SOLN: INTRAVENOUS | Status: DC

## 2023-12-04 MED ORDER — LIDOCAINE HCL 1 % IJ SOLN
INTRAMUSCULAR | Status: AC
  Filled 2023-12-04: qty 20

## 2023-12-04 MED ORDER — HEPARIN SODIUM (PORCINE) 1000 UNIT/ML IJ SOLN
INTRAMUSCULAR | Status: AC
  Filled 2023-12-04: qty 10

## 2023-12-04 SURGICAL SUPPLY — 14 items
CATH INFINITI AMBI 5FR JK (CATHETERS) IMPLANT
CATH INFINITI JR4 5F (CATHETERS) IMPLANT
CATH LAUNCHER 6FR JR4 (CATHETERS) IMPLANT
DEVICE RAD TR BAND REGULAR (VASCULAR PRODUCTS) IMPLANT
DRAPE BRACHIAL (DRAPES) IMPLANT
GLIDESHEATH SLEND SS 6F .021 (SHEATH) IMPLANT
GUIDEWIRE INQWIRE 1.5J.035X260 (WIRE) IMPLANT
GUIDEWIRE PRESSURE X 175 (WIRE) IMPLANT
KIT ENCORE 26 ADVANTAGE (KITS) IMPLANT
KIT SYRINGE INJ CVI SPIKEX1 (MISCELLANEOUS) IMPLANT
PACK CARDIAC CATH (CUSTOM PROCEDURE TRAY) ×2 IMPLANT
SET ATX-X65L (MISCELLANEOUS) IMPLANT
STATION PROTECTION PRESSURIZED (MISCELLANEOUS) IMPLANT
TUBING CIL FLEX 10 FLL-RA (TUBING) IMPLANT

## 2023-12-04 NOTE — Interval H&P Note (Signed)
 History and Physical Interval Note:  12/04/2023 7:49 AM  Deward JONETTA Getting  has presented today for surgery, with the diagnosis of L Cath    Abnormal CTA.  The various methods of treatment have been discussed with the patient and family. After consideration of risks, benefits and other options for treatment, the patient has consented to  Procedure(s): LEFT HEART CATH AND CORONARY ANGIOGRAPHY (Left) as a surgical intervention.  The patient's history has been reviewed, patient examined, no change in status, stable for surgery.  I have reviewed the patient's chart and labs.  Questions were answered to the patient's satisfaction.     Shameer Molstad

## 2023-12-07 ENCOUNTER — Encounter: Payer: Self-pay | Admitting: Cardiovascular Disease

## 2023-12-15 ENCOUNTER — Ambulatory Visit: Attending: Cardiology | Admitting: Cardiology

## 2023-12-15 ENCOUNTER — Encounter: Payer: Self-pay | Admitting: Cardiology

## 2023-12-15 VITALS — BP 142/76 | HR 88 | Ht 67.0 in | Wt 273.0 lb

## 2023-12-15 DIAGNOSIS — I4821 Permanent atrial fibrillation: Secondary | ICD-10-CM

## 2023-12-15 DIAGNOSIS — I2510 Atherosclerotic heart disease of native coronary artery without angina pectoris: Secondary | ICD-10-CM

## 2023-12-15 DIAGNOSIS — I10 Essential (primary) hypertension: Secondary | ICD-10-CM

## 2023-12-15 DIAGNOSIS — D6869 Other thrombophilia: Secondary | ICD-10-CM

## 2023-12-15 DIAGNOSIS — R55 Syncope and collapse: Secondary | ICD-10-CM

## 2023-12-15 NOTE — Patient Instructions (Addendum)
 Medication Instructions:  Your physician recommends that you continue on your current medications as directed. Please refer to the Current Medication list given to you today.  Labwork: None ordered.  Testing/Procedures: None ordered.  Follow-Up:  As needed follow up    Implantable Loop Recorder Placement, Care After This sheet gives you information about how to care for yourself after your procedure. Your health care provider may also give you more specific instructions. If you have problems or questions, contact your health care provider. What can I expect after the procedure? After the procedure, it is common to have: Soreness or discomfort near the incision. Some swelling or bruising near the incision.  Follow these instructions at home: Incision care  Monitor your cardiac device site for redness, swelling, and drainage. Call the device clinic at [phone] if you experience these symptoms or fever/chills.  Keep the large square bandage on your site for 24 hours and then you may remove it yourself. Keep the steri-strips underneath in place.   You may shower after 72 hours / 3 days from your procedure with the steri-strips in place. They will usually fall off on their own, or may be removed after 10 days. Pat dry.   Avoid lotions, ointments, or perfumes over your incision until it is well-healed.  Please do not submerge in water  until your site is completely healed.   Your device is MRI compatible.   Remote monitoring is used to monitor your cardiac device from home. This monitoring is scheduled every month by our office. It allows us  to keep an eye on the function of your device to ensure it is working properly.  If your wound site starts to bleed apply pressure.      If you have any questions/concerns please call the device clinic at [phone].  Activity  Return to your normal activities.  General instructions Follow instructions from your health care provider  about how to manage your implantable loop recorder and transmit the information. Learn how to activate a recording if this is necessary for your type of device. You may go through a metal detection gate, and you may let someone hold a metal detector over your chest. Show your ID card if needed. Do not have an MRI unless you check with your health care provider first. Take over-the-counter and prescription medicines only as told by your health care provider. Keep all follow-up visits as told by your health care provider. This is important. Contact a health care provider if: You have redness, swelling, or pain around your incision. You have a fever. You have pain that is not relieved by your pain medicine. You have triggered your device because of fainting (syncope) or because of a heartbeat that feels like it is racing, slow, fluttering, or skipping (palpitations). Get help right away if you have: Chest pain. Difficulty breathing. Summary After the procedure, it is common to have soreness or discomfort near the incision. Change your dressing as told by your health care provider. Follow instructions from your health care provider about how to manage your implantable loop recorder and transmit the information. Keep all follow-up visits as told by your health care provider. This is important. This information is not intended to replace advice given to you by your health care provider. Make sure you discuss any questions you have with your health care provider. Document Released: 12/04/2014 Document Revised: 02/07/2017 Document Reviewed: 02/07/2017 Elsevier Patient Education  2020 [employer].

## 2023-12-15 NOTE — Progress Notes (Signed)
 Electrophysiology Office Note:   Date:  12/15/2023  ID:  Mason Houston, DOB 12-11-1950, MRN [id number]  Primary Cardiologist: Deatrice Cage, MD Electrophysiologist: Fonda Kitty, MD      History of Present Illness:   Mason Houston is a 73 y.o. male with h/o permanent atrial fibrillation, OSA on CPAP, CVA, non-Hodgkin's lymphoma s/p chemotherapy, hypertension, and obesity who is being seen today for ILR implant at the request of Dr. Cage.  Discussed the use of AI scribe software for clinical note transcription with the patient, who gave verbal consent to proceed.  History of Present Illness Mason Houston is a 73 year old male with atrial fibrillation who presents with recurrent fainting spells. He is accompanied by his wife.  He experiences episodes of lightheadedness lasting five to ten seconds, sometimes resulting in syncope and waking up on the ground. These episodes began after he attempted to lose weight and increase his physical activity. He describes the sensation as 'real lightheaded' and sometimes finds himself on the ground without knowing the duration of unconsciousness. Upon regaining consciousness, he feels clear-headed immediately, unlike symptoms associated with hypoglycemia.  He has undergone extensive cardiac evaluations, including echocardiograms, carotid studies, and a cardiac catheterization, which did not indicate the need for stents. He has worn a heart monitor, which did not show significant findings during episodes of lightheadedness or syncope. He reports being told that his heart rate has dropped into the forties during episodes and that he may have atrial flutter in addition to his known atrial fibrillation.  He has a history of atrial fibrillation since at least 2021 and has been on Xarelto  due to previous transient ischemic attacks. He also reports a history of vertigo but distinguishes his current symptoms as different from past vertigo episodes. He is  currently taking diltiazem  to manage his heart rate, which was previously over 110 bpm.  He is concerned about the impact of these episodes on his ability to be active, noting that he was previously walking two to three miles a day to lose weight for a knee replacement surgery. He has experienced several fainting episodes since June, with the most recent occurring on October 7th, resulting in significant shoulder and back pain for ten days. He is worried about the risk of injury from falls, especially while on anticoagulation therapy.  He is managing his type 2 diabetes by monitoring his blood glucose and adjusting his diet to include more non-starchy foods. No prolonged confusion or fogginess after fainting episodes, which he associates with low blood sugar. He reports immediate clarity upon regaining consciousness.    Review of systems complete and found to be negative unless listed in HPI.   EP Information / Studies Reviewed:    EKG is ordered today. Personal review as below.  EKG Interpretation Date/Time:  Tuesday December 15 2023 14:14:17 EST Ventricular Rate:  88 PR Interval:    QRS Duration:  132 QT Interval:  408 QTC Calculation: 493 R Axis:   -24  Text Interpretation: Atrial fibrillation Right bundle branch block When compared with ECG of 04-Dec-2023 08:47, No significant change since Confirmed by Kitty Fonda [phone]) on 12/15/2023 10:35:21 PM   LHC 12/04/23:    Mid LAD lesion is 30% stenosed.   Prox RCA lesion is 30% stenosed.   Mid RCA lesion is 30% stenosed.   Dist RCA lesion is 60% stenosed.   1.  Moderate one-vessel coronary artery disease involving the distal right coronary artery.  This was not significant by  fractional flow reserve evaluation with an RFR of 0.95. 2.  Left ventricular angiography was not performed.  EF was normal by echo. 3.  Mildly to moderately elevated left ventricular end-diastolic pressure at 22 mmHg.   Echo 10/26/23:   1. Left ventricular  ejection fraction, by estimation, is 60 to 65%. Left  ventricular ejection fraction by 2D MOD biplane is 65.6 %. The left  ventricle has normal function. The left ventricle has no regional wall  motion abnormalities. There is mild left  ventricular hypertrophy. Left ventricular diastolic parameters are  indeterminate.   2. Right ventricular systolic function is normal. The right ventricular  size is normal.   3. The mitral valve is normal in structure. No evidence of mitral valve  regurgitation.   4. The aortic valve is tricuspid. Aortic valve regurgitation is not  visualized.   5. The inferior vena cava is normal in size with greater than 50%  respiratory variability, suggesting right atrial pressure of 3 mmHg.   Physical Exam:   VS:  BP (!) 142/76 (BP Location: Left Arm, Patient Position: Sitting, Cuff Size: Normal)   Pulse 88   Ht 5' 7 (1.702 m)   Wt 273 lb (123.8 kg)   SpO2 97%   BMI 42.76 kg/m    Wt Readings from Last 3 Encounters:  12/15/23 273 lb (123.8 kg)  12/04/23 262 lb (118.8 kg)  11/24/23 266 lb (120.7 kg)    General: Well developed, in no acute distress.  Neck: No JVD.  Cardiac: Normal rate, irregular rhythm.  Resp: Normal work of breathing.  Ext: No edema.  Neuro: No gross focal deficits.  Psych: Normal affect.    ASSESSMENT AND PLAN:    Recurrent syncope - Discussed role of loop recorder and monitoring for an arrhythmogenic or conduction disease related etiology for his syncope.  Discussed risk and benefits of loop recorder implantation.  Patient voiced understanding and elected to proceed.  Plan for loop recorder implant today. - Given that no cause has been identified informed patient that he is to abstain from driving for 6 months after his last syncopal episode.  Permanent atrial fibrillation Hypercoagulable state due to AF  Unfortunately, patient has been allowed to progress to permanent atrial fibrillation. Thus far, on his Zio, his symptom episodes  correlated with rate controlled AF.  If his AF is contributing to his symptoms, we will be unlikely to restore sinus rhythm given that he has been in AF continuously for many years.  If he were to have bradycardia, then pacing would be of benefit, but thus far we have not seen significant bradycardia which could explain his symptoms. -Continue diltiazem  360 mg daily for now. -Continue Xarelto  20 mg daily. -Monitor for bradycardia with loop recorder.   Hypertension -Above goal today.  Recommend checking blood pressures 1-2 times per week at home and recording the values.  Recommend bringing these recordings to the primary care physician.  Coronary artery disease Being medically managed.  Denies chest pain. -Continue rosuvastatin  10 mg daily.  Not on aspirin  due to Xarelto .  SURGEON:  Fonda Kitty, MD     PREPROCEDURE DIAGNOSIS:  Syncope    POSTPROCEDURE DIAGNOSIS: Syncope     PROCEDURES:   1. Implantable loop recorder implantation    INTRODUCTION:  MONTERIUS ROLF presents with a history of syncope The costs of loop recorder monitoring have been discussed with the patient.    DESCRIPTION OF PROCEDURE:  Informed written consent was obtained.   Time Out  Completed with RN    The patient required no sedation for the procedure today.  The patients left chest was therefore prepped and draped in the usual sterile fashion. The skin overlying the left parasternal region was infiltrated with lidocaine  for local analgesia.  A 0.5-cm incision was made over the left parasternal region over the 3rd intercostal space.  A subcutaneous ILR pocket was fashioned using a combination of sharp and blunt dissection.  A [employer] LUX-Dx II+ implantable loop recorder (serial # [id number]) was then placed into the pocket  R waves were very prominent and measuring 0.39mV.  Steri- Strips and a sterile dressing were then applied.  There were no early apparent complications.     CONCLUSIONS:   1. Successful  implantation of a implantable loop recorder for a history of Syncope.  2. No early apparent complications.   Follow up with EP Team as needed.   Signed, Fonda Kitty, MD

## 2023-12-22 ENCOUNTER — Encounter: Payer: Self-pay | Admitting: Cardiovascular Disease

## 2023-12-22 ENCOUNTER — Ambulatory Visit: Attending: Cardiovascular Disease | Admitting: Cardiovascular Disease

## 2023-12-22 VITALS — BP 148/78 | HR 80 | Ht 67.0 in | Wt 273.0 lb

## 2023-12-22 DIAGNOSIS — I10 Essential (primary) hypertension: Secondary | ICD-10-CM

## 2023-12-22 DIAGNOSIS — I2510 Atherosclerotic heart disease of native coronary artery without angina pectoris: Secondary | ICD-10-CM

## 2023-12-22 DIAGNOSIS — E785 Hyperlipidemia, unspecified: Secondary | ICD-10-CM

## 2023-12-22 DIAGNOSIS — I482 Chronic atrial fibrillation, unspecified: Secondary | ICD-10-CM

## 2023-12-22 NOTE — Patient Instructions (Signed)

## 2023-12-22 NOTE — Progress Notes (Unsigned)
 Cardiology Office Note   Date:  12/22/2023   ID:  Mason Houston, DOB 01-26-1950, MRN [id number]  PCP:  Mason Norleen JONETTA, MD  Cardiologist:   Deatrice Cage, MD   Chief Complaint  Patient presents with   Follow-up    1 month follow up  / pt has been doing well with no complaints of chest pain, chest pressure or SOB, medication reviewed verbally with patient .        History of Present Illness: Mason Houston is a 72 y.o. male who presents for a followup visit regarding chronic atrial fibrillation, recent syncope and coronary artery disease. The patient has a long history of  hypertension and obesity. He was diagnosed with atrial fibrillation with rapid ventricular response during a routine physical  in December of 2013 in the setting of heavy caffeine intake. He also has sleep apnea currently effectively treated with CPAP.  He had a stroke in August 2015.   He was diagnosed with non-Hodgkin's lymphoma in 2017 and was treated successfully with chemotherapy.   He is being treated with rate control for atrial fibrillation given lack of symptoms.  He was seen recently for recurrent syncopal episodes of unclear etiology.  Carotid Doppler showed mild nonobstructive disease.  2-week outpatient monitor showed atrial fibrillation with heart rate ranging from 42 to 192 bpm with no other arrhythmia.  Echocardiogram showed normal LV systolic function and no significant valvular abnormalities.  Cardiac CTA was done and showed moderate disease overall but CT FFR was negative in the right coronary artery.  Cardiac catheterization was done via the right radial artery which showed 30% mid LAD stenosis and 60% distal RCA stenosis.  The distal RCA stenosis was not significant by flow reserve evaluation with an RFR of 0.95.  LVEDP was 22.  He was seen by EP and underwent loop recorder placement.  Past Medical History:  Diagnosis Date   Atherosclerosis    Atrial fibrillation (HCC)    Cancer  (HCC)    Chronic bronchitis (HCC)    Collagen vascular disease    Depression    Diabetes mellitus without complication (HCC)    Dysrhythmia    Fibromyalgia    GERD (gastroesophageal reflux disease)    H/O Bell's palsy    History of German measles    Hypertension    Mild mitral regurgitation by prior echocardiogram    Mild tricuspid regurgitation by prior echocardiogram    Mini stroke    Morbid obesity with BMI of 40.0-44.9, adult (HCC)    Non Hodgkin's lymphoma (HCC) 2017   Osteoarthrosis, unspecified whether generalized or localized, lower leg    Sixth cranial nerve palsy    Sleep apnea    CPAP   Stroke (HCC)    Mini strokes. None since 2015 (went on Xarelto )   Vertigo    Wears dentures    full upper    Past Surgical History:  Procedure Laterality Date   CATARACT EXTRACTION W/PHACO Left 08/13/2022   Procedure: CATARACT EXTRACTION PHACO AND INTRAOCULAR LENS PLACEMENT (IOC) LEFT MALYUGIN DIABETIC 9.15 00:45.7;  Surgeon: Mittie Gaskin, MD;  Location: [facility];  Service: Ophthalmology;  Laterality: Left;   CATARACT EXTRACTION W/PHACO Right 08/27/2022   Procedure: CATARACT EXTRACTION PHACO AND INTRAOCULAR LENS PLACEMENT (IOC) RIGHT MALYUGIN DIABETIC 8.11 00:44.0;  Surgeon: Mittie Gaskin, MD;  Location: [facility];  Service: Ophthalmology;  Laterality: Right;   COLONOSCOPY  2012   Dr Ora   COLONOSCOPY WITH PROPOFOL  N/A 09/09/2017  Procedure: COLONOSCOPY WITH PROPOFOL ;  Surgeon: Toledo, Ladell POUR, MD;  Location: [facility];  Service: Gastroenterology;  Laterality: N/A;   COLONOSCOPY WITH PROPOFOL  N/A 09/21/2018   Procedure: COLONOSCOPY WITH PROPOFOL ;  Surgeon: Toledo, Ladell POUR, MD;  Location: [facility];  Service: Gastroenterology;  Laterality: N/A;   CORONARY PRESSURE/FFR STUDY N/A 12/04/2023   Procedure: CORONARY PRESSURE/FFR STUDY;  Surgeon: Darron Deatrice LABOR, MD;  Location: [facility];  Service: Cardiovascular;  Laterality: N/A;    CYST EXCISION     ERCP N/A 12/29/2014   Procedure: ENDOSCOPIC RETROGRADE CHOLANGIOPANCREATOGRAPHY (ERCP);  Surgeon: Deward CINDERELLA Piedmont, MD;  Location: [facility];  Service: Endoscopy;  Laterality: N/A;   ERCP N/A 02/21/2015   Procedure: ENDOSCOPIC RETROGRADE CHOLANGIOPANCREATOGRAPHY (ERCP);  Surgeon: Deward CINDERELLA Piedmont, MD;  Location: [facility];  Service: Gastroenterology;  Laterality: N/A;   ERCP N/A 08/14/2015   Procedure: ENDOSCOPIC RETROGRADE CHOLANGIOPANCREATOGRAPHY (ERCP) Stent removal;  Surgeon: Rogelia Copping, MD;  Location: [facility];  Service: Endoscopy;  Laterality: N/A;   LEFT HEART CATH AND CORONARY ANGIOGRAPHY Left 12/04/2023   Procedure: LEFT HEART CATH AND CORONARY ANGIOGRAPHY;  Surgeon: Darron Deatrice LABOR, MD;  Location: [facility];  Service: Cardiovascular;  Laterality: Left;   PERIPHERAL VASCULAR CATHETERIZATION N/A 01/31/2015   Procedure: Pat Cath Insertion;  Surgeon: Cordella KANDICE Shawl, MD;  Location: [facility];  Service: Cardiovascular;  Laterality: N/A;   PERIPHERAL VASCULAR CATHETERIZATION N/A 12/18/2015   Procedure: Pat Cath Removal;  Surgeon: Cordella KANDICE Shawl, MD;  Location: [facility];  Service: Cardiovascular;  Laterality: N/A;   TRIGGER FINGER RELEASE       Current Outpatient Medications  Medication Sig Dispense Refill   ACETAMINOPHEN  ER PO Take 1,000 mg by mouth daily.     Ascorbic Acid (VITAMIN C) 1000 MG tablet Take 1,000 mg by mouth daily.     b complex vitamins capsule Take 1 capsule by mouth daily.      cholecalciferol (VITAMIN D ) 1000 units tablet Take 2,000 Units by mouth daily.     diltiazem  (TIAZAC ) 360 MG 24 hr capsule Take 1 capsule by mouth once daily 90 capsule 2   glipiZIDE (GLUCOTROL XL) 10 MG 24 hr tablet Take 10 mg by mouth daily.     loratadine  (CLARITIN ) 10 MG tablet Take 10 mg by mouth daily as needed.      losartan  (COZAAR ) 50 MG tablet Take 2 tablets by mouth once daily 180 tablet 3   magnesium  oxide (MAG-OX) 400  (241.3 Mg) MG tablet Take 250 mg daily by mouth.      meclizine (ANTIVERT) 25 MG tablet Take 25 mg by mouth 3 (three) times daily as needed for dizziness.     metFORMIN (GLUCOPHAGE) 1000 MG tablet Take 1,000 mg by mouth 2 (two) times daily.     omeprazole  (PRILOSEC) 20 MG capsule Take 1 capsule by mouth once daily 90 capsule 0   rivaroxaban  (XARELTO ) 20 MG TABS tablet TAKE 1 TABLET BY MOUTH ONCE DAILY WITH SUPPER 90 tablet 1   rosuvastatin  (CRESTOR ) 10 MG tablet TAKE 1 TABLET(10 MG) BY MOUTH DAILY 90 tablet 0   No current facility-administered medications for this visit.    Allergies:   Celebrex [celecoxib], Other, and Penicillins    Social History:  The patient  reports that he quit smoking about 42 years ago. His smoking use included cigarettes. He started smoking about 57 years ago. He has a 67.5 pack-year smoking history. He has never used smokeless tobacco.  He reports that he does not drink alcohol and does not use drugs.   Family History:  The patient's family history includes Diabetes in his paternal grandfather and paternal grandmother; Heart attack in his father; Hypertension in his mother; Stroke in his father.    ROS:  Please see the history of present illness.   Otherwise, review of systems are positive for none.   All other systems are reviewed and negative.    PHYSICAL EXAM: VS:  BP (!) 148/78 (BP Location: Left Arm, Patient Position: Sitting, Cuff Size: Normal)   Pulse 80   Ht 5' 7 (1.702 m)   Wt 273 lb (123.8 kg)   SpO2 98%   BMI 42.76 kg/m  , BMI Body mass index is 42.76 kg/m. GEN: Well nourished, well developed, in no acute distress  HEENT: normal  Neck: no JVD, carotid bruits, or masses Cardiac: Irregularly irregular; no murmurs, rubs, or gallops, trace leg edema  Respiratory:  clear to auscultation bilaterally, normal work of breathing GI: soft, nontender, nondistended, + BS MS: no deformity or atrophy  Skin: warm and dry, no rash Neuro:  Strength and  sensation are intact Psych: euthymic mood, full affect   EKG:  EKG is  ordered today. EKG showed : Atrial fibrillation with premature ventricular or aberrantly conducted complexes Right bundle branch block Left anterior fascicular block Bifascicular block When compared with ECG of 15-Dec-2023 14:14, Left anterior fascicular block is now Present T wave inversion no longer evident in Inferior leads        Recent Labs: 11/24/2023: BUN 17; Creatinine, Ser 1.28; Hemoglobin 14.0; Platelets 371; Potassium 5.5; Sodium 141    Lipid Panel    Component Value Date/Time   CHOL 139 05/08/2016 1152   CHOL 181 07/03/2015 1007   TRIG 69 05/08/2016 1152   HDL 42 05/08/2016 1152   HDL 44 07/03/2015 1007   CHOLHDL 3.3 05/08/2016 1152   VLDL 14 05/08/2016 1152   LDLCALC 83 05/08/2016 1152   LDLCALC 122 (H) 07/03/2015 1007      Wt Readings from Last 3 Encounters:  12/22/23 273 lb (123.8 kg)  12/15/23 273 lb (123.8 kg)  12/04/23 262 lb (118.8 kg)        ASSESSMENT AND PLAN:   1.    Chronic atrial fibrillation:  Ventricular rate is controlled with diltiazem .  He is tolerating anticoagulation with Xarelto  with no side effects.  I agree that he is a good candidate for the Watchman device to avoid the need for long-term anticoagulation.  2. Essential hypertension: His blood pressure is well controlled on diltiazem  and losartan .   3.  Hyperlipidemia: Most recent lipid profile in January showed an LDL of 48.  Continue rosuvastatin .   4. Sleep apnea: on CPAP  5.  Coronary artery disease involving native coronary arteries without angina: 60% stenosis in the distal right coronary artery.  Continue medical therapy.  Continue rosuvastatin .  No antiplatelet medications given that he is on Xarelto .  6.  Recurrent syncopal episodes: Unclear etiology.  He now has a loop recorder in place.    Disposition:   FU with me in 6 months  Signed,  Deatrice Cage, MD  12/22/2023 3:48 PM    [facility]

## 2023-12-25 ENCOUNTER — Ambulatory Visit: Admitting: Physician Assistant

## 2024-01-15 ENCOUNTER — Ambulatory Visit: Attending: Internal Medicine

## 2024-01-15 DIAGNOSIS — I482 Chronic atrial fibrillation, unspecified: Secondary | ICD-10-CM | POA: Diagnosis not present

## 2024-01-17 LAB — CUP PACEART REMOTE DEVICE CHECK
Date Time Interrogation Session: 20260109014400
Implantable Pulse Generator Implant Date: 20251209
Pulse Gen Serial Number: 165010

## 2024-01-19 NOTE — Progress Notes (Signed)
 Remote Loop Recorder Transmission

## 2024-02-10 ENCOUNTER — Encounter: Payer: Self-pay | Admitting: Cardiology

## 2024-02-10 ENCOUNTER — Telehealth: Payer: Self-pay

## 2024-02-10 ENCOUNTER — Ambulatory Visit: Admitting: Cardiology

## 2024-02-10 VITALS — BP 136/74 | HR 63 | Ht 67.0 in | Wt 276.2 lb

## 2024-02-10 DIAGNOSIS — Z95818 Presence of other cardiac implants and grafts: Secondary | ICD-10-CM

## 2024-02-10 DIAGNOSIS — D6869 Other thrombophilia: Secondary | ICD-10-CM | POA: Diagnosis not present

## 2024-02-10 DIAGNOSIS — R55 Syncope and collapse: Secondary | ICD-10-CM

## 2024-02-10 DIAGNOSIS — I4821 Permanent atrial fibrillation: Secondary | ICD-10-CM | POA: Diagnosis not present

## 2024-02-10 DIAGNOSIS — Z8673 Personal history of transient ischemic attack (TIA), and cerebral infarction without residual deficits: Secondary | ICD-10-CM | POA: Diagnosis not present

## 2024-02-10 DIAGNOSIS — I2510 Atherosclerotic heart disease of native coronary artery without angina pectoris: Secondary | ICD-10-CM | POA: Diagnosis not present

## 2024-02-10 NOTE — Telephone Encounter (Signed)
 Received message patient wishes to pursue LAAO. He has appt with GI 2/17 to arrange colonoscopy. Will follow up with patient after that appointment.

## 2024-02-10 NOTE — Patient Instructions (Signed)
 Medication Instructions:  Your physician recommends that you continue on your current medications as directed. Please refer to the Current Medication list given to you today.  *If you need a refill on your cardiac medications before your next appointment, please call your pharmacy*  Testing/Procedures: Watchman  Your physician has requested that you have Left atrial appendage (LAA) closure device implantation is a procedure to put a small device in the LAA of the heart. The LAA is a small sac in the wall of the heart's left upper chamber. Blood clots can form in this area. The device, Watchman closes the LAA to help prevent a blood clot and stroke.  You will be contacted by Nurse Navigator, Danielle to schedule your pre-procedure visit and procedure date. If you have any questions she can be reached at [phone].   Follow-Up: At [facility], you and your health needs are our priority.  As part of our continuing mission to provide you with exceptional heart care, our providers are all part of one team.  This team includes your primary Cardiologist (physician) and Advanced Practice Providers or APPs (Physician Assistants and Nurse Practitioners) who all work together to provide you with the care you need, when you need it.

## 2024-02-15 ENCOUNTER — Ambulatory Visit

## 2024-03-17 ENCOUNTER — Ambulatory Visit

## 2024-04-11 ENCOUNTER — Ambulatory Visit: Admitting: Physician Assistant

## 2024-04-11 ENCOUNTER — Ambulatory Visit: Admitting: Internal Medicine

## 2024-04-18 ENCOUNTER — Ambulatory Visit

## 2024-06-22 ENCOUNTER — Ambulatory Visit: Admitting: Cardiovascular Disease
